# Patient Record
Sex: Female | Born: 1945
Health system: Southern US, Community
[De-identification: ages and names within clinical notes are randomized; demographics above are authoritative.]

## PROBLEM LIST (undated history)

## (undated) DIAGNOSIS — D649 Anemia, unspecified: Secondary | ICD-10-CM

## (undated) DIAGNOSIS — I509 Heart failure, unspecified: Secondary | ICD-10-CM

## (undated) DIAGNOSIS — R252 Cramp and spasm: Secondary | ICD-10-CM

## (undated) DIAGNOSIS — Z9289 Personal history of other medical treatment: Secondary | ICD-10-CM

## (undated) DIAGNOSIS — M1990 Unspecified osteoarthritis, unspecified site: Secondary | ICD-10-CM

## (undated) DIAGNOSIS — K219 Gastro-esophageal reflux disease without esophagitis: Secondary | ICD-10-CM

## (undated) DIAGNOSIS — C801 Malignant (primary) neoplasm, unspecified: Secondary | ICD-10-CM

## (undated) DIAGNOSIS — I639 Cerebral infarction, unspecified: Secondary | ICD-10-CM

## (undated) DIAGNOSIS — IMO0001 Reserved for inherently not codable concepts without codable children: Secondary | ICD-10-CM

## (undated) DIAGNOSIS — G4730 Sleep apnea, unspecified: Secondary | ICD-10-CM

## (undated) DIAGNOSIS — R011 Cardiac murmur, unspecified: Secondary | ICD-10-CM

## (undated) DIAGNOSIS — K5900 Constipation, unspecified: Secondary | ICD-10-CM

## (undated) DIAGNOSIS — I10 Essential (primary) hypertension: Secondary | ICD-10-CM

## (undated) HISTORY — PX: CARPAL TUNNEL RELEASE: SHX101

## (undated) HISTORY — PX: KNEE ARTHROPLASTY: SHX992

## (undated) HISTORY — PX: TUBAL LIGATION: SHX77

## (undated) HISTORY — PX: KNEE SURGERY: SHX244

## (undated) HISTORY — PX: COLONOSCOPY: SHX174

## (undated) HISTORY — DX: Malignant (primary) neoplasm, unspecified: C80.1

## (undated) HISTORY — PX: JOINT REPLACEMENT: SHX530

## (undated) HISTORY — PX: FOOT SURGERY: SHX648

## (undated) HISTORY — PX: HAND SURGERY: SHX662

## (undated) HISTORY — PX: ABDOMINAL HYSTERECTOMY: SHX81

---

## 1997-07-28 ENCOUNTER — Ambulatory Visit (HOSPITAL_COMMUNITY): Admission: RE | Admit: 1997-07-28 | Discharge: 1997-07-28 | Payer: Self-pay | Admitting: Obstetrics and Gynecology

## 1998-08-03 ENCOUNTER — Other Ambulatory Visit: Admission: RE | Admit: 1998-08-03 | Discharge: 1998-08-03 | Payer: Self-pay | Admitting: Obstetrics and Gynecology

## 1999-08-14 ENCOUNTER — Other Ambulatory Visit: Admission: RE | Admit: 1999-08-14 | Discharge: 1999-08-14 | Payer: Self-pay | Admitting: Obstetrics and Gynecology

## 1999-08-21 ENCOUNTER — Encounter: Admission: RE | Admit: 1999-08-21 | Discharge: 1999-08-21 | Payer: Self-pay | Admitting: Obstetrics and Gynecology

## 1999-08-21 ENCOUNTER — Encounter: Payer: Self-pay | Admitting: Obstetrics and Gynecology

## 2000-10-23 ENCOUNTER — Encounter: Payer: Self-pay | Admitting: Family Medicine

## 2000-10-23 ENCOUNTER — Encounter: Admission: RE | Admit: 2000-10-23 | Discharge: 2000-10-23 | Payer: Self-pay | Admitting: Family Medicine

## 2000-11-07 ENCOUNTER — Encounter: Admission: RE | Admit: 2000-11-07 | Discharge: 2000-11-07 | Payer: Self-pay | Admitting: Family Medicine

## 2000-11-12 ENCOUNTER — Other Ambulatory Visit: Admission: RE | Admit: 2000-11-12 | Discharge: 2000-11-12 | Payer: Self-pay | Admitting: Internal Medicine

## 2001-01-09 ENCOUNTER — Encounter: Admission: RE | Admit: 2001-01-09 | Discharge: 2001-01-09 | Payer: Self-pay | Admitting: Family Medicine

## 2001-01-09 ENCOUNTER — Encounter: Payer: Self-pay | Admitting: Family Medicine

## 2001-12-29 ENCOUNTER — Encounter: Admission: RE | Admit: 2001-12-29 | Discharge: 2001-12-29 | Payer: Self-pay | Admitting: Family Medicine

## 2001-12-29 ENCOUNTER — Encounter: Payer: Self-pay | Admitting: Family Medicine

## 2002-08-27 ENCOUNTER — Ambulatory Visit (HOSPITAL_COMMUNITY): Admission: RE | Admit: 2002-08-27 | Discharge: 2002-08-27 | Payer: Self-pay | Admitting: Gastroenterology

## 2002-12-31 ENCOUNTER — Ambulatory Visit (HOSPITAL_COMMUNITY): Admission: RE | Admit: 2002-12-31 | Discharge: 2002-12-31 | Payer: Self-pay | Admitting: Obstetrics and Gynecology

## 2003-04-07 ENCOUNTER — Other Ambulatory Visit: Admission: RE | Admit: 2003-04-07 | Discharge: 2003-04-07 | Payer: Self-pay | Admitting: Obstetrics and Gynecology

## 2004-01-06 ENCOUNTER — Ambulatory Visit (HOSPITAL_COMMUNITY): Admission: RE | Admit: 2004-01-06 | Discharge: 2004-01-06 | Payer: Self-pay | Admitting: Obstetrics and Gynecology

## 2004-01-17 ENCOUNTER — Encounter: Admission: RE | Admit: 2004-01-17 | Discharge: 2004-01-17 | Payer: Self-pay | Admitting: Obstetrics and Gynecology

## 2004-02-07 ENCOUNTER — Encounter: Admission: RE | Admit: 2004-02-07 | Discharge: 2004-02-07 | Payer: Self-pay | Admitting: Obstetrics and Gynecology

## 2004-07-14 ENCOUNTER — Ambulatory Visit: Payer: Self-pay | Admitting: Family Medicine

## 2004-07-20 ENCOUNTER — Encounter (HOSPITAL_COMMUNITY): Admission: RE | Admit: 2004-07-20 | Discharge: 2004-07-21 | Payer: Self-pay | Admitting: Internal Medicine

## 2004-08-03 ENCOUNTER — Ambulatory Visit: Payer: Self-pay | Admitting: Family Medicine

## 2004-08-17 ENCOUNTER — Ambulatory Visit: Payer: Self-pay | Admitting: Family Medicine

## 2004-08-25 ENCOUNTER — Ambulatory Visit: Payer: Self-pay | Admitting: Family Medicine

## 2005-01-08 ENCOUNTER — Encounter (INDEPENDENT_AMBULATORY_CARE_PROVIDER_SITE_OTHER): Payer: Self-pay | Admitting: Family Medicine

## 2005-01-08 LAB — CONVERTED CEMR LAB: Pap Smear: NORMAL

## 2005-01-16 ENCOUNTER — Ambulatory Visit (HOSPITAL_COMMUNITY): Admission: RE | Admit: 2005-01-16 | Discharge: 2005-01-16 | Payer: Self-pay | Admitting: Family Medicine

## 2005-02-05 ENCOUNTER — Encounter: Admission: RE | Admit: 2005-02-05 | Discharge: 2005-02-05 | Payer: Self-pay | Admitting: Family Medicine

## 2005-06-05 ENCOUNTER — Ambulatory Visit: Payer: Self-pay | Admitting: Family Medicine

## 2005-06-22 ENCOUNTER — Ambulatory Visit: Payer: Self-pay | Admitting: Family Medicine

## 2005-07-19 ENCOUNTER — Ambulatory Visit: Payer: Self-pay | Admitting: Family Medicine

## 2005-07-19 ENCOUNTER — Ambulatory Visit (HOSPITAL_COMMUNITY): Admission: RE | Admit: 2005-07-19 | Discharge: 2005-07-19 | Payer: Self-pay | Admitting: Otolaryngology

## 2005-08-13 ENCOUNTER — Ambulatory Visit: Payer: Self-pay | Admitting: Family Medicine

## 2005-08-14 ENCOUNTER — Ambulatory Visit: Admission: RE | Admit: 2005-08-14 | Discharge: 2005-08-14 | Payer: Self-pay | Admitting: Family Medicine

## 2005-08-14 ENCOUNTER — Encounter (INDEPENDENT_AMBULATORY_CARE_PROVIDER_SITE_OTHER): Payer: Self-pay | Admitting: Family Medicine

## 2005-08-15 ENCOUNTER — Ambulatory Visit: Payer: Self-pay | Admitting: Pulmonary Disease

## 2005-09-05 ENCOUNTER — Encounter (INDEPENDENT_AMBULATORY_CARE_PROVIDER_SITE_OTHER): Payer: Self-pay | Admitting: Family Medicine

## 2005-09-24 ENCOUNTER — Ambulatory Visit: Payer: Self-pay | Admitting: Family Medicine

## 2005-12-13 ENCOUNTER — Encounter: Payer: Self-pay | Admitting: Family Medicine

## 2005-12-13 DIAGNOSIS — G5600 Carpal tunnel syndrome, unspecified upper limb: Secondary | ICD-10-CM | POA: Insufficient documentation

## 2005-12-13 DIAGNOSIS — G609 Hereditary and idiopathic neuropathy, unspecified: Secondary | ICD-10-CM | POA: Insufficient documentation

## 2005-12-13 DIAGNOSIS — I499 Cardiac arrhythmia, unspecified: Secondary | ICD-10-CM | POA: Insufficient documentation

## 2005-12-13 DIAGNOSIS — I10 Essential (primary) hypertension: Secondary | ICD-10-CM | POA: Insufficient documentation

## 2005-12-24 ENCOUNTER — Ambulatory Visit: Payer: Self-pay | Admitting: Family Medicine

## 2006-01-31 ENCOUNTER — Encounter (INDEPENDENT_AMBULATORY_CARE_PROVIDER_SITE_OTHER): Payer: Self-pay | Admitting: Family Medicine

## 2006-02-06 ENCOUNTER — Ambulatory Visit (HOSPITAL_COMMUNITY): Admission: RE | Admit: 2006-02-06 | Discharge: 2006-02-06 | Payer: Self-pay | Admitting: Family Medicine

## 2006-03-20 ENCOUNTER — Encounter (INDEPENDENT_AMBULATORY_CARE_PROVIDER_SITE_OTHER): Payer: Self-pay | Admitting: Family Medicine

## 2006-03-25 ENCOUNTER — Ambulatory Visit: Payer: Self-pay | Admitting: Family Medicine

## 2006-04-15 ENCOUNTER — Encounter (INDEPENDENT_AMBULATORY_CARE_PROVIDER_SITE_OTHER): Payer: Self-pay | Admitting: Family Medicine

## 2006-05-15 ENCOUNTER — Encounter (INDEPENDENT_AMBULATORY_CARE_PROVIDER_SITE_OTHER): Payer: Self-pay | Admitting: Family Medicine

## 2006-05-16 ENCOUNTER — Telehealth (INDEPENDENT_AMBULATORY_CARE_PROVIDER_SITE_OTHER): Payer: Self-pay | Admitting: Family Medicine

## 2006-07-02 ENCOUNTER — Encounter (INDEPENDENT_AMBULATORY_CARE_PROVIDER_SITE_OTHER): Payer: Self-pay | Admitting: Family Medicine

## 2006-08-14 ENCOUNTER — Ambulatory Visit (HOSPITAL_COMMUNITY): Admission: RE | Admit: 2006-08-14 | Discharge: 2006-08-14 | Payer: Self-pay | Admitting: Family Medicine

## 2006-08-14 ENCOUNTER — Ambulatory Visit: Payer: Self-pay | Admitting: Family Medicine

## 2006-08-14 DIAGNOSIS — E785 Hyperlipidemia, unspecified: Secondary | ICD-10-CM | POA: Insufficient documentation

## 2006-08-14 LAB — CONVERTED CEMR LAB
Cholesterol, target level: 200 mg/dL
HDL goal, serum: 40 mg/dL
LDL Goal: 130 mg/dL

## 2006-08-15 ENCOUNTER — Telehealth (INDEPENDENT_AMBULATORY_CARE_PROVIDER_SITE_OTHER): Payer: Self-pay | Admitting: *Deleted

## 2006-08-15 ENCOUNTER — Encounter (INDEPENDENT_AMBULATORY_CARE_PROVIDER_SITE_OTHER): Payer: Self-pay | Admitting: Family Medicine

## 2006-08-20 ENCOUNTER — Ambulatory Visit (HOSPITAL_COMMUNITY): Admission: RE | Admit: 2006-08-20 | Discharge: 2006-08-20 | Payer: Self-pay | Admitting: Family Medicine

## 2006-08-21 ENCOUNTER — Telehealth (INDEPENDENT_AMBULATORY_CARE_PROVIDER_SITE_OTHER): Payer: Self-pay | Admitting: *Deleted

## 2006-08-21 ENCOUNTER — Encounter (INDEPENDENT_AMBULATORY_CARE_PROVIDER_SITE_OTHER): Payer: Self-pay | Admitting: Family Medicine

## 2006-09-09 ENCOUNTER — Encounter (INDEPENDENT_AMBULATORY_CARE_PROVIDER_SITE_OTHER): Payer: Self-pay | Admitting: Family Medicine

## 2006-09-13 ENCOUNTER — Encounter (INDEPENDENT_AMBULATORY_CARE_PROVIDER_SITE_OTHER): Payer: Self-pay | Admitting: Family Medicine

## 2006-09-26 ENCOUNTER — Encounter: Admission: RE | Admit: 2006-09-26 | Discharge: 2006-10-23 | Payer: Self-pay | Admitting: Neurosurgery

## 2006-10-25 ENCOUNTER — Encounter (INDEPENDENT_AMBULATORY_CARE_PROVIDER_SITE_OTHER): Payer: Self-pay | Admitting: Family Medicine

## 2006-10-29 ENCOUNTER — Encounter (INDEPENDENT_AMBULATORY_CARE_PROVIDER_SITE_OTHER): Payer: Self-pay | Admitting: Family Medicine

## 2006-11-13 ENCOUNTER — Ambulatory Visit: Payer: Self-pay | Admitting: Family Medicine

## 2007-01-13 ENCOUNTER — Ambulatory Visit: Payer: Self-pay | Admitting: Family Medicine

## 2007-01-13 LAB — CONVERTED CEMR LAB

## 2007-01-20 ENCOUNTER — Encounter (INDEPENDENT_AMBULATORY_CARE_PROVIDER_SITE_OTHER): Payer: Self-pay | Admitting: Family Medicine

## 2007-02-10 ENCOUNTER — Ambulatory Visit (HOSPITAL_COMMUNITY): Admission: RE | Admit: 2007-02-10 | Discharge: 2007-02-10 | Payer: Self-pay | Admitting: Family Medicine

## 2007-02-10 LAB — CONVERTED CEMR LAB: Pap Smear: NORMAL

## 2007-02-11 ENCOUNTER — Telehealth (INDEPENDENT_AMBULATORY_CARE_PROVIDER_SITE_OTHER): Payer: Self-pay | Admitting: *Deleted

## 2007-03-07 ENCOUNTER — Encounter (INDEPENDENT_AMBULATORY_CARE_PROVIDER_SITE_OTHER): Payer: Self-pay | Admitting: Family Medicine

## 2007-04-08 ENCOUNTER — Encounter (INDEPENDENT_AMBULATORY_CARE_PROVIDER_SITE_OTHER): Payer: Self-pay | Admitting: Family Medicine

## 2007-04-14 ENCOUNTER — Ambulatory Visit: Payer: Self-pay | Admitting: Family Medicine

## 2007-04-14 LAB — CONVERTED CEMR LAB
Bilirubin Urine: NEGATIVE
Blood in Urine, dipstick: NEGATIVE
Glucose, Urine, Semiquant: NEGATIVE
Ketones, urine, test strip: NEGATIVE
Nitrite: NEGATIVE
Protein, U semiquant: NEGATIVE
Specific Gravity, Urine: 1.025
Urobilinogen, UA: 0.2
WBC Urine, dipstick: NEGATIVE
pH: 5.5

## 2007-05-05 ENCOUNTER — Encounter (INDEPENDENT_AMBULATORY_CARE_PROVIDER_SITE_OTHER): Payer: Self-pay | Admitting: Family Medicine

## 2007-05-26 ENCOUNTER — Ambulatory Visit: Payer: Self-pay | Admitting: Family Medicine

## 2007-05-28 ENCOUNTER — Encounter (INDEPENDENT_AMBULATORY_CARE_PROVIDER_SITE_OTHER): Payer: Self-pay | Admitting: Family Medicine

## 2007-06-06 ENCOUNTER — Encounter (INDEPENDENT_AMBULATORY_CARE_PROVIDER_SITE_OTHER): Payer: Self-pay | Admitting: Family Medicine

## 2007-08-25 ENCOUNTER — Ambulatory Visit: Payer: Self-pay | Admitting: Family Medicine

## 2007-08-25 LAB — CONVERTED CEMR LAB: LDL Goal: 160 mg/dL

## 2007-11-25 ENCOUNTER — Ambulatory Visit: Payer: Self-pay | Admitting: Family Medicine

## 2007-11-25 ENCOUNTER — Ambulatory Visit (HOSPITAL_COMMUNITY): Admission: RE | Admit: 2007-11-25 | Discharge: 2007-11-25 | Payer: Self-pay | Admitting: Family Medicine

## 2007-11-25 DIAGNOSIS — M25579 Pain in unspecified ankle and joints of unspecified foot: Secondary | ICD-10-CM | POA: Insufficient documentation

## 2007-11-27 ENCOUNTER — Encounter (INDEPENDENT_AMBULATORY_CARE_PROVIDER_SITE_OTHER): Payer: Self-pay | Admitting: Family Medicine

## 2007-11-27 LAB — CONVERTED CEMR LAB
ALT: 10 U/L
AST: 17 U/L
Albumin: 3.9 g/dL
Alkaline Phosphatase: 98 U/L
BUN: 25 mg/dL — ABNORMAL HIGH
CO2: 22 meq/L
Calcium: 9.5 mg/dL
Chloride: 105 meq/L
Creatinine, Ser: 0.89 mg/dL
Glucose, Bld: 73 mg/dL
Potassium: 4.5 meq/L
Sodium: 140 meq/L
Total Bilirubin: 0.4 mg/dL
Total Protein: 7.3 g/dL

## 2007-12-23 ENCOUNTER — Ambulatory Visit: Payer: Self-pay | Admitting: Family Medicine

## 2008-01-12 LAB — CONVERTED CEMR LAB: Pap Smear: NORMAL

## 2008-02-16 ENCOUNTER — Ambulatory Visit (HOSPITAL_COMMUNITY): Admission: RE | Admit: 2008-02-16 | Discharge: 2008-02-16 | Payer: Self-pay | Admitting: Family Medicine

## 2008-03-30 ENCOUNTER — Ambulatory Visit: Payer: Self-pay | Admitting: Family Medicine

## 2008-03-31 ENCOUNTER — Encounter (INDEPENDENT_AMBULATORY_CARE_PROVIDER_SITE_OTHER): Payer: Self-pay | Admitting: Family Medicine

## 2008-03-31 LAB — CONVERTED CEMR LAB
ALT: 11 units/L (ref 0–35)
AST: 14 units/L (ref 0–37)
Albumin: 3.7 g/dL (ref 3.5–5.2)
Alkaline Phosphatase: 90 units/L (ref 39–117)
BUN: 18 mg/dL (ref 6–23)
Basophils Absolute: 0.1 10*3/uL (ref 0.0–0.1)
Basophils Relative: 1 % (ref 0–1)
CO2: 27 meq/L (ref 19–32)
Calcium: 9 mg/dL (ref 8.4–10.5)
Chloride: 109 meq/L (ref 96–112)
Cholesterol: 155 mg/dL (ref 0–200)
Creatinine, Ser: 0.88 mg/dL (ref 0.40–1.20)
Eosinophils Absolute: 0.2 10*3/uL (ref 0.0–0.7)
Eosinophils Relative: 3 % (ref 0–5)
Glucose, Bld: 87 mg/dL (ref 70–99)
HCT: 37.2 % (ref 36.0–46.0)
HDL: 66 mg/dL (ref >39)
Hemoglobin: 11.5 g/dL — ABNORMAL LOW (ref 12.0–15.0)
LDL Cholesterol: 79 mg/dL (ref 0–99)
Lymphocytes Relative: 25 % (ref 12–46)
Lymphs Abs: 1.6 10*3/uL (ref 0.7–4.0)
MCHC: 30.9 g/dL (ref 30.0–36.0)
MCV: 89.4 fL (ref 78.0–100.0)
Monocytes Absolute: 0.4 10*3/uL (ref 0.1–1.0)
Monocytes Relative: 6 % (ref 3–12)
Neutro Abs: 4.1 10*3/uL (ref 1.7–7.7)
Neutrophils Relative %: 65 % (ref 43–77)
Platelets: 305 10*3/uL (ref 150–400)
Potassium: 4.5 meq/L (ref 3.5–5.3)
RBC: 4.16 M/uL (ref 3.87–5.11)
RDW: 13.8 % (ref 11.5–15.5)
Sodium: 145 meq/L (ref 135–145)
TSH: 0.799 microintl units/mL (ref 0.350–4.500)
Total Bilirubin: 0.3 mg/dL (ref 0.3–1.2)
Total CHOL/HDL Ratio: 2.3
Total Protein: 6.9 g/dL (ref 6.0–8.3)
Triglycerides: 52 mg/dL (ref <150)
VLDL: 10 mg/dL (ref 0–40)
WBC: 6.4 10*3/uL (ref 4.0–10.5)

## 2008-04-01 LAB — CONVERTED CEMR LAB: Retic Ct Pct: 0.8 % (ref 0.4–3.1)

## 2008-04-05 ENCOUNTER — Encounter (INDEPENDENT_AMBULATORY_CARE_PROVIDER_SITE_OTHER): Payer: Self-pay | Admitting: Family Medicine

## 2008-04-13 ENCOUNTER — Ambulatory Visit: Payer: Self-pay | Admitting: Family Medicine

## 2008-04-13 DIAGNOSIS — D649 Anemia, unspecified: Secondary | ICD-10-CM | POA: Insufficient documentation

## 2008-04-29 ENCOUNTER — Encounter (INDEPENDENT_AMBULATORY_CARE_PROVIDER_SITE_OTHER): Payer: Self-pay | Admitting: Family Medicine

## 2008-05-25 ENCOUNTER — Ambulatory Visit: Payer: Self-pay | Admitting: Family Medicine

## 2008-05-25 DIAGNOSIS — K5900 Constipation, unspecified: Secondary | ICD-10-CM | POA: Insufficient documentation

## 2008-05-25 DIAGNOSIS — R0609 Other forms of dyspnea: Secondary | ICD-10-CM | POA: Insufficient documentation

## 2008-05-25 DIAGNOSIS — R0989 Other specified symptoms and signs involving the circulatory and respiratory systems: Secondary | ICD-10-CM | POA: Insufficient documentation

## 2008-05-25 LAB — CONVERTED CEMR LAB
Bilirubin Urine: NEGATIVE
Blood in Urine, dipstick: NEGATIVE
Glucose, Urine, Semiquant: NEGATIVE
Ketones, urine, test strip: NEGATIVE
Nitrite: NEGATIVE
Protein, U semiquant: 30
Specific Gravity, Urine: 1.02
Urobilinogen, UA: NEGATIVE
pH: 6

## 2008-06-02 ENCOUNTER — Encounter (INDEPENDENT_AMBULATORY_CARE_PROVIDER_SITE_OTHER): Payer: Self-pay | Admitting: Family Medicine

## 2008-08-24 ENCOUNTER — Ambulatory Visit: Payer: Self-pay | Admitting: Family Medicine

## 2008-09-24 ENCOUNTER — Telehealth (INDEPENDENT_AMBULATORY_CARE_PROVIDER_SITE_OTHER): Payer: Self-pay | Admitting: Family Medicine

## 2008-09-28 ENCOUNTER — Ambulatory Visit: Payer: Self-pay | Admitting: Family Medicine

## 2008-09-28 DIAGNOSIS — L259 Unspecified contact dermatitis, unspecified cause: Secondary | ICD-10-CM | POA: Insufficient documentation

## 2008-10-28 ENCOUNTER — Ambulatory Visit (HOSPITAL_COMMUNITY): Admission: RE | Admit: 2008-10-28 | Discharge: 2008-10-28 | Payer: Self-pay | Admitting: Rheumatology

## 2009-02-17 ENCOUNTER — Ambulatory Visit (HOSPITAL_COMMUNITY): Admission: RE | Admit: 2009-02-17 | Discharge: 2009-02-17 | Payer: Self-pay | Admitting: Family Medicine

## 2009-09-30 ENCOUNTER — Ambulatory Visit (HOSPITAL_BASED_OUTPATIENT_CLINIC_OR_DEPARTMENT_OTHER): Admission: RE | Admit: 2009-09-30 | Discharge: 2009-10-01 | Payer: Self-pay | Admitting: Orthopedic Surgery

## 2010-01-15 ENCOUNTER — Inpatient Hospital Stay (HOSPITAL_COMMUNITY)
Admission: EM | Admit: 2010-01-15 | Discharge: 2010-01-17 | Payer: Self-pay | Source: Home / Self Care | Attending: Internal Medicine | Admitting: Internal Medicine

## 2010-01-23 LAB — CBC
HCT: 33.1 % — ABNORMAL LOW (ref 36.0–46.0)
HCT: 32 % — ABNORMAL LOW (ref 36.0–46.0)
HCT: 32.1 % — ABNORMAL LOW (ref 36.0–46.0)
Hemoglobin: 10.6 g/dL — ABNORMAL LOW (ref 12.0–15.0)
Hemoglobin: 10 g/dL — ABNORMAL LOW (ref 12.0–15.0)
Hemoglobin: 10.1 g/dL — ABNORMAL LOW (ref 12.0–15.0)
MCH: 29 pg (ref 26.0–34.0)
MCH: 28.7 pg (ref 26.0–34.0)
MCH: 28.9 pg (ref 26.0–34.0)
MCHC: 32 g/dL (ref 30.0–36.0)
MCHC: 31.3 g/dL (ref 30.0–36.0)
MCHC: 31.5 g/dL (ref 30.0–36.0)
MCV: 90.7 fL (ref 78.0–100.0)
MCV: 91.7 fL (ref 78.0–100.0)
MCV: 92 fL (ref 78.0–100.0)
Platelets: 352 10*3/uL (ref 150–400)
Platelets: 307 10*3/uL (ref 150–400)
Platelets: 279 10*3/uL (ref 150–400)
RBC: 3.65 MIL/uL — ABNORMAL LOW (ref 3.87–5.11)
RBC: 3.49 MIL/uL — ABNORMAL LOW (ref 3.87–5.11)
RBC: 3.49 MIL/uL — ABNORMAL LOW (ref 3.87–5.11)
RDW: 14.6 % (ref 11.5–15.5)
RDW: 14.9 % (ref 11.5–15.5)
RDW: 15.2 % (ref 11.5–15.5)
WBC: 8.3 10*3/uL (ref 4.0–10.5)
WBC: 6.4 10*3/uL (ref 4.0–10.5)
WBC: 6.1 10*3/uL (ref 4.0–10.5)

## 2010-01-23 LAB — CARDIAC PANEL(CRET KIN+CKTOT+MB+TROPI)
CK, MB: 1 ng/mL (ref 0.3–4.0)
CK, MB: 1 ng/mL (ref 0.3–4.0)
Relative Index: INVALID (ref 0.0–2.5)
Relative Index: INVALID (ref 0.0–2.5)
Total CK: 48 U/L (ref 7–177)
Total CK: 46 U/L (ref 7–177)
Troponin I: 0.02 ng/mL (ref 0.00–0.06)
Troponin I: 0.02 ng/mL (ref 0.00–0.06)

## 2010-01-23 LAB — GLUCOSE, CAPILLARY
Glucose-Capillary: 80 mg/dL (ref 70–99)
Glucose-Capillary: 79 mg/dL (ref 70–99)
Glucose-Capillary: 74 mg/dL (ref 70–99)
Glucose-Capillary: 71 mg/dL (ref 70–99)
Glucose-Capillary: 78 mg/dL (ref 70–99)
Glucose-Capillary: 91 mg/dL (ref 70–99)

## 2010-01-23 LAB — COMPREHENSIVE METABOLIC PANEL
ALT: 11 U/L (ref 0–35)
AST: 14 U/L (ref 0–37)
Albumin: 3 g/dL — ABNORMAL LOW (ref 3.5–5.2)
Alkaline Phosphatase: 79 U/L (ref 39–117)
BUN: 16 mg/dL (ref 6–23)
CO2: 24 mEq/L (ref 19–32)
Calcium: 8.6 mg/dL (ref 8.4–10.5)
Chloride: 112 mEq/L (ref 96–112)
Creatinine, Ser: 1.1 mg/dL (ref 0.4–1.2)
GFR calc Af Amer: >60 mL/min (ref >60)
GFR calc non Af Amer: 50 mL/min — ABNORMAL LOW (ref >60)
Glucose, Bld: 67 mg/dL — ABNORMAL LOW (ref 70–99)
Potassium: 4 mEq/L (ref 3.5–5.1)
Sodium: 142 mEq/L (ref 135–145)
Total Bilirubin: 0.5 mg/dL (ref 0.3–1.2)
Total Protein: 6.2 g/dL (ref 6.0–8.3)

## 2010-01-23 LAB — DIFFERENTIAL
Basophils Absolute: 0.1 10*3/uL (ref 0.0–0.1)
Basophils Relative: 1 % (ref 0–1)
Eosinophils Absolute: 0.1 10*3/uL (ref 0.0–0.7)
Eosinophils Relative: 1 % (ref 0–5)
Lymphocytes Relative: 32 % (ref 12–46)
Lymphs Abs: 2.7 10*3/uL (ref 0.7–4.0)
Monocytes Absolute: 0.3 10*3/uL (ref 0.1–1.0)
Monocytes Relative: 4 % (ref 3–12)
Neutro Abs: 5.2 10*3/uL (ref 1.7–7.7)
Neutrophils Relative %: 63 % (ref 43–77)

## 2010-01-23 LAB — URINE MICROSCOPIC-ADD ON

## 2010-01-23 LAB — BASIC METABOLIC PANEL
BUN: 20 mg/dL (ref 6–23)
CO2: 25 mEq/L (ref 19–32)
Calcium: 9.4 mg/dL (ref 8.4–10.5)
Chloride: 107 mEq/L (ref 96–112)
Creatinine, Ser: 1.06 mg/dL (ref 0.4–1.2)
GFR calc Af Amer: >60 mL/min (ref >60)
GFR calc non Af Amer: 52 mL/min — ABNORMAL LOW (ref >60)
Glucose, Bld: 88 mg/dL (ref 70–99)
Potassium: 4.3 mEq/L (ref 3.5–5.1)
Sodium: 141 mEq/L (ref 135–145)

## 2010-01-23 LAB — URINALYSIS, ROUTINE W REFLEX MICROSCOPIC
Bilirubin Urine: NEGATIVE
Hgb urine dipstick: NEGATIVE
Ketones, ur: NEGATIVE mg/dL
Nitrite: NEGATIVE
Protein, ur: NEGATIVE mg/dL
Specific Gravity, Urine: 1.021 (ref 1.005–1.030)
Urine Glucose, Fasting: NEGATIVE mg/dL
Urobilinogen, UA: 1 mg/dL (ref 0.0–1.0)
pH: 5.5 (ref 5.0–8.0)

## 2010-01-23 LAB — POCT CARDIAC MARKERS
CKMB, poc: 1.2 ng/mL (ref 1.0–8.0)
Myoglobin, poc: 108 ng/mL (ref 12–200)
Troponin i, poc: <0.05 ng/mL (ref 0.00–0.09)

## 2010-01-23 LAB — PROTIME-INR
INR: 1.02 (ref 0.00–1.49)
Prothrombin Time: 13.6 seconds (ref 11.6–15.2)

## 2010-01-23 LAB — CK TOTAL AND CKMB (NOT AT ARMC)
CK, MB: 1.2 ng/mL (ref 0.3–4.0)
Relative Index: INVALID (ref 0.0–2.5)
Total CK: 71 U/L (ref 7–177)

## 2010-01-23 LAB — HEMOGLOBIN A1C
Hgb A1c MFr Bld: 6 % — ABNORMAL HIGH (ref <5.7)
Mean Plasma Glucose: 126 mg/dL — ABNORMAL HIGH (ref <117)

## 2010-01-23 LAB — TROPONIN I: Troponin I: 0.02 ng/mL (ref 0.00–0.06)

## 2010-01-23 LAB — ABO/RH: ABO/RH(D): O POS

## 2010-01-23 LAB — TYPE AND SCREEN
ABO/RH(D): O POS
Antibody Screen: NEGATIVE

## 2010-01-23 LAB — OCCULT BLOOD, POC DEVICE: Fecal Occult Bld: POSITIVE

## 2010-01-23 LAB — APTT: aPTT: 34 seconds (ref 24–37)

## 2010-01-27 ENCOUNTER — Other Ambulatory Visit (HOSPITAL_COMMUNITY): Payer: Self-pay | Admitting: Family Medicine

## 2010-01-27 DIAGNOSIS — Z0000 Encounter for general adult medical examination without abnormal findings: Secondary | ICD-10-CM

## 2010-01-27 DIAGNOSIS — Z1231 Encounter for screening mammogram for malignant neoplasm of breast: Secondary | ICD-10-CM

## 2010-01-27 NOTE — Discharge Summary (Signed)
[name], [name]             ACCOUNT NO.:  [id number]  MEDICAL RECORD NO.:  [id number]          PATIENT TYPE:  INP  LOCATION:  6705                         FACILITY:  [facility]  PHYSICIAN:  Osvaldo Shipper, MD     DATE OF BIRTH:  [birth year]  DATE OF ADMISSION:  01/15/2010 DATE OF DISCHARGE:  01/17/2010                              DISCHARGE SUMMARY   PRIMARY CARE PHYSICIAN:  [facility].  The patient was seen in consultation during this hospitalization by Dr. Anselmo Rod, gastroenterologist, and also by Dr. Almond Lint with [facility].  Imaging studies done during this admission include a chest x-ray which showed no acute disease.  PERTINENT LABS:  Hemoglobin of 10.1 which had been stable.  Renal function was normal.  Hemoglobin A1c was 6.0.  Cardiac enzymes were negative.  UA did show small leukocytes.  Squamous epithelial cells 0-2 WBCs.  DISCHARGE DIAGNOSES: 1. Hemorrhoidal bleeding requiring outpatient surgery followup. 2. Hypotension resolved. 3. Rectal bleed as a result of hemorrhoidal bleeding. 4. Anemia secondary to blood loss.  Did not have transfusion. 5. Mild hypoglycemia due to initiation of oral hypoglycemic agents     erroneously.  BRIEF HOSPITAL COURSE: 1. Rectal bleed.  This is a 65 year old African American female who     presented to the hospital with complaints of weakness and rectal     bleeding.  This has been ongoing for 2 days.  The patient when she     was seen in the ED was found to have a low blood pressure at 85/42.     Hemoglobin was 10.6.  The patient was admitted because of low blood     pressure.  She was given IV fluids.  The patient's blood pressure     improved.  Her hemoglobin remained stable.  Bleeding resolved.  She     was seen by Dr. Charna Elizabeth from gastroenterology who recommended     surgery consultation.  She was seen by general surgery who     recommended sitz baths, Anusol-HC cream and Anusol  suppositories     and follow-up in their office.  Since her bleeding was resolved and     hemoglobin is stable, she will be discharged home.  Sitz baths were     also recommended. 2. Hypoglycemia.  This was noted as well during the course of the     hospitalization.  Initially the admitting physician was under the     impression that the patient has type 2 diabetes.  However, it     became apparent that the patient and her husband's medication were     listed on the same sheet of paper and the husband does have     diabetes.  His medication was started on her.  This resulted in     hypoglycemia.  After these medications were discontinued, her blood     sugar has improved.  She has been tolerating a diet with no     difficulties. 3. Hypotension was probably from volume loss with IV fluids.  Blood     pressure has improved.  Orthostatics were  checked just a little     while ago and she is not orthostatic anymore and she has been     ambulating with no difficulties. 4. Anemia is probably from acute blood loss.  She did not require     transfusion.  She will be followed up as an outpatient. 5. UA was found to be abnormal.  However, it appears to be a     contaminated sample.  WBCs are only 0 to 2.  Her white cell count     was normal.  She was afebrile and so she was not started on any     antibiotics.  The patient was keen on going home after being evaluated by the surgeon and since there was no other reason to keep her here, she was allowed to go home.  She was discharged.  DISCHARGE MEDICATIONS: 1. Anusol-HC cream one application topically three times a day around     the anus. 2. Anusol hemorrhoidal suppository one application rectally three     times day. 3. Colace 100 mg p.o. b.i.d. 4. MiraLAX 17 grams p.o. daily as needed for constipation. 5. Amitriptyline 50 mg daily at bedtime. 6. Aspirin 81 mg p.o. daily. 7. Folic acid 1 mg p.o. b.i.d. 8. Humira injections 40 mg  subcutaneously every other week on Sundays. 9. Hydrochlorothiazide 25 mg p.o. daily. 10.Methotrexate 2.5 mg 8 tablets weekly on Saturdays. 11.Metoprolol 25 mg p.o. daily. 12.Naproxen 500 mg p.o. b.i.d. 13.Ramipril  2.5 mg p.o. daily. 14.Simvastatin 20 mg p.o. daily. 15.Tramadol 50 mg 1-2 tablets daily as needed.  Please note that we will ask the patient to restart her hydrochlorothiazide, her metoprolol and her ramipril after 2 days.  DIET:  High fiber diet.  PHYSICAL ACTIVITY:  As before.  She has been told to avoid constipation. She has been asked to use sitz baths three times a week with lukewarm water.  FOLLOWUP:  Followup with Dr. Donell Beers with [facility] in 3 weeks; with her PCP as needed.  Total time on this discharge encounter 35 minutes.    Osvaldo Shipper, MD     GK/MEDQ  D:  01/17/2010  T:  01/17/2010  Job:  [id number]  cc:   Almond Lint, MD Anselmo Rod, MD, [facility]  Electronically Signed by Osvaldo Shipper MD on 01/26/2010 07:21:39 PM

## 2010-02-05 LAB — CONVERTED CEMR LAB
Albumin: 3.9 g/dL
Alkaline Phosphatase: 105 units/L
BUN: 18 mg/dL
Calcium: 9.4 mg/dL
Chloride: 105 meq/L
Cholesterol: 152 mg/dL
Creatinine, Ser: 0.81 mg/dL
Glucose, Bld: 74 mg/dL
HDL: 68 mg/dL
LDL Cholesterol: 72 mg/dL
Potassium: 4.3 meq/L
Sodium: 144 meq/L
Total Bilirubin: 0.2 mg/dL
Total Protein: 7.3 g/dL
Triglycerides: 59 mg/dL
VLDL: 12 mg/dL

## 2010-02-07 NOTE — Assessment & Plan Note (Signed)
Summary: 2 MONTH FOLLOW UP/DMS   Vital Signs:  Patient Profile:   65 Years Old Female Height:     60 inches (152.4 cm) Weight:      229 pounds BMI:     44.89 O2 Sat:      97 % Temp:     97 degrees F Pulse rate:   78 / minute Resp:     14 per minute BP sitting:   130 / 88  Vitals Entered By: Sherilyn Banker (January 13, 2007 2:59 PM)                 PCP:  Franchot Heidelberg, MD  Chief Complaint:  follow up visit.  History of Present Illness: Pt in for recheck.  She has had a URI for a week. States has cough and nasal congestion. States clear to yellow. Has self treated with Alka SelzerPlus. She denies fever, chills and sweats. Has felt hot and cold. She denies chest pain, orthopnea, PND and palpitations and leg swelling. Notes no ear pain or sore throat. Notes illness going through house - son and husbnd have same. Her apetite is good. She denies nausea and vomitting. No diarrhea or constipation. SHe has an irritated nose and notes from blowing it so much. Vasline has been applied and not helping to much.  Now presents.  Hypertension History:      She denies headache, chest pain, palpitations, dyspnea with exertion, orthopnea, PND, peripheral edema, visual symptoms, neurologic problems, syncope, and side effects from treatment.  She notes no problems with any antihypertensive medication side effects.        Positive major cardiovascular risk factors include female age 80 years old or older, hyperlipidemia, and hypertension.  Negative major cardiovascular risk factors include no history of diabetes, negative family history for ischemic heart disease, and non-tobacco-user status.        Further assessment for target organ damage reveals no history of ASHD, stroke/TIA, or peripheral vascular disease.    Lipid Management History:      Positive NCEP/ATP III risk factors include female age 14 years old or older and hypertension.  Negative NCEP/ATP III risk factors include no history of  early menopause without estrogen hormone replacement, non-diabetic, no family history for ischemic heart disease, non-tobacco-user status, no ASHD (atherosclerotic heart disease), no prior stroke/TIA, no peripheral vascular disease, and no history of aortic aneurysm.        The patient states that she knows about the "Therapeutic Lifestyle Change" diet.  Her compliance with the TLC diet is fair.  The patient does not know about adjunctive measures for cholesterol lowering.  Adjunctive measures started by the patient include aerobic exercise, fiber, ASA, and omega-3 supplements.  She expresses no side effects from her lipid-lowering medication.  The patient denies any symptoms to suggest myopathy or liver disease from her "statin" therapy.  Comments: See labs from Dr. Domingo Sep in Sept. Due for recheck in March.    Current Allergies: No known allergies   Past Medical History:    Reviewed history from 12/13/2005 and no changes required:       Hypertension       Low back pain       Peripheral neuropathy  Past Surgical History:    Reviewed history from 03/25/2006 and no changes required:       TAH       KNEE SURGERY LEFT   Family History:    Reviewed history from 03/25/2006 and no changes required:  Father: Dead 68 CHF       Mother: 65  RA, DM, CHF, Hodgkins       Siblings: Sister x 1: Depression, Anxiety, Thyroid Cancer - not sure of age; 2 x brothers: OA  Social History:    Reviewed history from 03/25/2006 and no changes required:       Occupation: Dentist       Married       Former Smoker       Alcohol use-no       Drug use-no   Risk Factors: Tobacco use:  quit    Year quit:  1982    Pack-years:  3 to 7 cigs for ten years Drug use:  no Alcohol use:  no  Family History Risk Factors:    Family History of MI in females < 66 years old:  no    Family History of MI in males < 30 years old:  no  Colonoscopy History:    Date of Last Colonoscopy:  01/09/2000   Mammogram History:     Date of Last Mammogram:  01/13/2007    Results:  GYN at [facility] - records pending  PAP Smear History:     Date of Last PAP Smear:  01/13/2007    Results:  GYN at [facility] - records pending   Review of Systems      See HPI  General      Denies fatigue and malaise.  Resp      See HPI.  GI      Denies abdominal pain, constipation, diarrhea, nausea, and vomiting.  GU      Female exam via GYN at [facility]. Has done in February.  Neuro      Denies difficulty with concentration, headaches, tingling, tremors, and weakness.      States back doing well. Has DDD. She completed PT. Notes doing well. States occasional pain. Notes would rate as 2/10. Happy as not ready for surgery. She has some numbness and tingling to right leg but notes after PT has done better. Comes and goes as back pain. Sees [facility] again next few weeks - Dr.Hirsch.  Psych      Denies anxiety and depression.   Physical Exam  General:     Well-developed,well-nourished,in no acute distress; alert,appropriate and cooperative throughout examination. Obese. Head:     Normocephalic and atraumatic without obvious abnormalities. No apparent alopecia or balding. Eyes:     No corneal or conjunctival inflammation noted. EOMI. Perrla.  Ears:     External ear exam shows no significant lesions or deformities.  Otoscopic examination reveals clear canals, tympanic membranes are intact bilaterally without bulging, retraction, inflammation or discharge. Hearing is grossly normal bilaterally. Nose:     Mod congestion with clear liquid. Mouth:     Oral mucosa and oropharynx without lesions or exudates.   Lungs:     Normal respiratory effort, chest expands symmetrically. Lungs are clear to auscultation, no crackles or wheezes. Heart:     Normal rate and regular rhythm. S1 and S2 normal without gallop, murmur, click, rub or other extra sounds. Abdomen:     Bowel sounds  positive,abdomen soft and non-tender without masses, organomegaly or hernias noted. Extremities:     No clubbing, cyanosis, edema, or deformity noted with normal full range of motion of all joints.   Neurologic:     No cranial nerve deficits noted. Station and gait are normal. Plantar reflexes are down-going bilaterally. DTRs are symmetrical throughout.  Sensory, motor and coordinative functions appear intact. HAS normal SLT but reports lower back pain on right  -notes shoots across lower back to left. Similar to pain at night. Cervical Nodes:     No lymphadenopathy noted Psych:     Cognition and judgment appear intact. Alert and cooperative with normal attention span and concentration. No apparent delusions, illusions, hallucinations    Impression & Recommendations:  Problem # 1:  DEGENERATIVE DISC DISEASE (ICD-722.6) Per Neurosurgery. Advised back exersize and weight loss.   Problem # 2:  HYPERLIPIDEMIA (ICD-272.4) Statin as is. Labs as per Dr. Skip Estimable. TLC a must. Her updated medication list for this problem includes:    Simvastatin 20 Mg Tabs (Simvastatin) ..... One at bedtime   Problem # 3:  MORBID OBESITY (ICD-278.01) Councelled. Not candidate for phentermine until TLC in place. Will start actively at [employer] as friend gave membership forXmas. Limit portions. Make healthy choices. Recheck 3 months and optomize.  Problem # 4:  VIRAL URI (ICD-465.9) Sx rx only. Fluid push, rest and daily MVI with use of Mucinex. Update if sx worsen or persist in 10 days.  Her updated medication list for this problem includes:    Naprosyn Susp (Naproxen susp) .Marland Kitchen... As needed    Aspirin 81 Mg Tbec (Aspirin) ..... Once daily   Problem # 5:  Preventive Health Care (ICD-V70.0) Female exam at [facility] GYN next few weeks. Await report.  Complete Medication List: 1)  Hydrochlorothiazide 25 Mg Tabs (Hydrochlorothiazide) .... Once daily 2)  Metoprolol Succinate 25 Mg Tb24 (Metoprolol succinate)  .... 1/2 once daily 3)  Altace 2.5 Mg Caps (Ramipril) .... Once daily 4)  Naprosyn Susp (Naproxen susp) .... As needed 5)  Aspirin 81 Mg Tbec (Aspirin) .... Once daily 6)  Simvastatin 20 Mg Tabs (Simvastatin) .... One at bedtime  Hypertension Assessment/Plan:      The patient's hypertensive risk group is category B: At least one risk factor (excluding diabetes) with no target organ damage.  Today's blood pressure is 130/88.  Her blood pressure goal is < 140/90.  Lipid Assessment/Plan:      Based on NCEP/ATP III, the patient's risk factor category is "2 or more risk factors and a calculated 10 year CAD risk of < 20%".  From this information, the patient's calculated lipid goals are as follows: Total cholesterol goal is 200; LDL cholesterol goal is 130; HDL cholesterol goal is 40; Triglyceride goal is 150.     Patient Instructions: 1)  Please schedule a follow-up appointment in 3 months.    ]  Preventive Care Screening  Mammogram:    Date:  01/13/2007    Results:  GYN at [facility] - records pending  Pap Smear:    Date:  01/13/2007    Results:  GYN at [facility] - records pending

## 2010-02-07 NOTE — Letter (Signed)
[facility]   Imported By: Curtis Sites 06/09/2008 15:22:01  _____________________________________________________________________  External Attachment:    Type:   Image     Comment:   External Document

## 2010-02-07 NOTE — Assessment & Plan Note (Signed)
Summary: follow up 3 month/slj   Vital Signs:  Patient Profile:   65 Years Old Female Height:     60 inches (152.4 cm) Weight:      219 pounds BMI:     42.93 O2 Sat:      99 % Temp:     98.0 degrees F Pulse rate:   70 / minute Resp:     12 per minute BP sitting:   136 / 81  Vitals Entered By: Sherilyn Banker (November 25, 2007 8:57 AM)                 PCP:  Franchot Heidelberg, MD  Chief Complaint:  follow up visit.  History of Present Illness: Pt in for recheck.  She notes she is doing well.  She states she is having some ankle pain. She states both sides hurt. She states has stiffness in am and they feel tight. She notes as the day goes by gets a little better. She rates her pain as 9/10 this morning. She denies falls and injuries. Notes no hx of gout. She denies redness and swelling. She does have hx of DDD but notes separate issues. She is morbidly obese and cont to go to [facility]. She has gained 2 pounds since last visit. She has had ankle pain for few months now. She states using Naproxen for pain and some relief. States using 2 at same time as this will relive symptoms. She thinks just OA but wants to be sure.  She now presents.  Hypertension History:      She denies headache, chest pain, palpitations, dyspnea with exertion, orthopnea, PND, peripheral edema, visual symptoms, neurologic problems, syncope, and side effects from treatment.  She notes no problems with any antihypertensive medication side effects.  Further comments include: Taking BP medication daily. Marland Kitchen        Positive major cardiovascular risk factors include female age 65 years old or older, hyperlipidemia, and hypertension.  Negative major cardiovascular risk factors include no history of diabetes, negative family history for ischemic heart disease, and non-tobacco-user status.        Further assessment for target organ damage reveals no history of ASHD, stroke/TIA, or peripheral vascular disease.    Lipid  Management History:      Positive NCEP/ATP III risk factors include female age 38 years old or older and hypertension.  Negative NCEP/ATP III risk factors include no history of early menopause without estrogen hormone replacement, non-diabetic, HDL cholesterol greater than 60, no family history for ischemic heart disease, non-tobacco-user status, no ASHD (atherosclerotic heart disease), no prior stroke/TIA, no peripheral vascular disease, and no history of aortic aneurysm.        The patient states that she knows about the "Therapeutic Lifestyle Change" diet.  Her compliance with the TLC diet is fair.  The patient expresses understanding of adjunctive measures for cholesterol lowering.  Adjunctive measures started by the patient include aerobic exercise, fiber, and ASA.  She expresses no side effects from her lipid-lowering medication.  The patient denies any symptoms to suggest myopathy or liver disease from her "statin" therapy.        Prior Medications Reviewed Using: Patient Recall  Updated Prior Medication List: HYDROCHLOROTHIAZIDE 25 MG TABS (HYDROCHLOROTHIAZIDE) once daily METOPROLOL SUCCINATE 25 MG TB24 (METOPROLOL SUCCINATE) 1/2 once daily ALTACE 2.5 MG CAPS (RAMIPRIL) once daily EC-NAPROSYN 500 MG  TBEC (NAPROXEN) One two times a day ASPIRIN 81 MG TBEC (ASPIRIN) once daily SIMVASTATIN 20 MG  TABS (  SIMVASTATIN) One at bedtime  Current Allergies (reviewed today): No known allergies   Past Medical History:    Reviewed history from 08/25/2007 and no changes required:       Current Problems:        DEGENERATIVE DISC DISEASE (ICD-722.6)       HYPERLIPIDEMIA (ICD-272.4)       ANOSMIA (ICD-781.1)       MORBID OBESITY (ICD-278.01)       SLEEP APNEA (ICD-780.57)       CARPAL TUNNEL SYNDROME (ICD-354.0)       DYSRHYTHMIA, 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)       PERIPHERAL NEUROPATHY (ICD-356.9)       LOW BACK PAIN (ICD-724.2)       HYPERTENSION (ICD-401.9)         Past Surgical History:     Reviewed history from 03/25/2006 and no changes required:       TAH       KNEE SURGERY LEFT   Family History:    Reviewed history from 03/25/2006 and no changes required:       Father: Dead 41 CHF       Mother: 76  RA, DM, CHF, Hodgkins       Siblings: Sister x 1: Depression, Anxiety, Thyroid Cancer - not sure of age; 2 x brothers: OA  Social History:    Reviewed history from 03/25/2006 and no changes required:       Occupation: Dentist       Married       Former Smoker       Alcohol use-no       Drug use-no   Risk Factors: Tobacco use:  quit    Year quit:  1982    Pack-years:  3 to 7 cigs for ten years Drug use:  no Alcohol use:  no  Family History Risk Factors:    Family History of MI in females < 41 years old:  no    Family History of MI in males < 37 years old:  no  Colonoscopy History:    Date of Last Colonoscopy:  01/09/2000  Mammogram History:    Date of Last Mammogram:  02/10/2007  PAP Smear History:    Date of Last PAP Smear:  02/10/2007   Review of Systems      See HPI  General      Denies chills, fever, and sweats.  Resp      Denies cough, shortness of breath, sputum productive, and wheezing.  GI      Denies abdominal pain, constipation, diarrhea, nausea, and vomiting.  GU      Denies nocturia, urinary frequency, and urinary hesitancy.   Physical Exam  General:     Well-developed,well-nourished,in no acute distress; alert,appropriate and cooperative throughout examination. Obese. Lungs:     Normal respiratory effort, chest expands symmetrically. Lungs are clear to auscultation, no crackles or wheezes. Heart:     Normal rate and regular rhythm. S1 and S2 normal without gallop, murmur, click, rub or other extra sounds. Abdomen:     Bowel sounds positive,abdomen soft and non-tender without masses, organomegaly or hernias noted. Extremities:     No clubbing, cyanosis, edema, or deformity noted with normal full range of motion of  all joints.  Reports pain on ankle rotation and crepitus is felt. Cervical Nodes:     No lymphadenopathy noted Psych:     Cognition and judgment appear intact. Alert and cooperative with normal attention span and concentration. No  apparent delusions, illusions, hallucinations    Impression & Recommendations:  Problem # 1:  ANKLE PAIN, BILATERAL (ICD-719.47) Suspect OA. Get plain films. Start Naproxen two times a day and add Tylebol 500 mg two three times a day as needed for breakthrough pain. Start topical voltaren gel and gave single Decadron?Depomedrol shot for pain relief with risk and benefit outlined. Recheck 4 weeks. Orders: T-DG Ankle 2 Views *R* ([id number])   Problem # 2:  MORBID OBESITY (ICD-278.01) Weight loss is crucial. Aware of morbidity and mortality risk and edcuated how this plays into ankle pain. States will try harder. Recheck 4 weeks. Advised portion control, low fat diet and daily exersize for 30 minutes with calorie intake of 1500.  Problem # 3:  HYPERTENSION (ICD-401.9) Stable. Meds as below. Check renal fucntion and lytes. Limit salt, lose weight. Her updated medication list for this problem includes:    Hydrochlorothiazide 25 Mg Tabs (Hydrochlorothiazide) ..... Once daily    Metoprolol Succinate 25 Mg Tb24 (Metoprolol succinate) .Marland Kitchen... 1/2 once daily    Altace 2.5 Mg Caps (Ramipril) ..... Once daily  Orders: T-Comprehensive Metabolic Panel ([id number])   Problem # 4:  HYPERLIPIDEMIA (ICD-272.4) Check LFTs on high risk med. TLC is a must. Her updated medication list for this problem includes:    Simvastatin 20 Mg Tabs (Simvastatin) ..... One at bedtime  Orders: T-Comprehensive Metabolic Panel ([id number])   Problem # 5:  Preventive Health Care (ICD-V70.0) We are out of flu shots and advised shortage. Pt to all HD and get via them - also to check with them on H1N1.  Complete Medication List: 1)  Hydrochlorothiazide 25 Mg Tabs (Hydrochlorothiazide) ....  Once daily 2)  Metoprolol Succinate 25 Mg Tb24 (Metoprolol succinate) .... 1/2 once daily 3)  Altace 2.5 Mg Caps (Ramipril) .... Once daily 4)  Ec-naprosyn 500 Mg Tbec (Naproxen) .... One two times a day 5)  Aspirin 81 Mg Tbec (Aspirin) .... Once daily 6)  Simvastatin 20 Mg Tabs (Simvastatin) .... One at bedtime  Other Orders: Depo- Medrol 80mg  (J1040) Dexamethasone Sodium Phosphate 1mg  (J1100) Admin of Therapeutic Inj  intramuscular or subcutaneous (29562)  Hypertension Assessment/Plan:      The patient's hypertensive risk group is category B: At least one risk factor (excluding diabetes) with no target organ damage.  Her calculated 10 year risk of coronary heart disease is 5 %.  Today's blood pressure is 136/81.  Her blood pressure goal is < 140/90.  Lipid Assessment/Plan:      Based on NCEP/ATP III, the patient's risk factor category is "0-1 risk factors".  From this information, the patient's calculated lipid goals are as follows: Total cholesterol goal is 200; LDL cholesterol goal is 160; HDL cholesterol goal is 40; Triglyceride goal is 150.     Patient Instructions: 1)  Please schedule a follow-up appointment in 1 month.   ]  Preventive Care Screening  Last Flu Shot:    Date:  11/25/2007    Next Due:  11/2007    Results:  Advised    Medication Administration  Injection # 1:    Medication: Depo- Medrol 80mg     Diagnosis: DEGENERATIVE DISC DISEASE (ICD-722.6)    Route: IM    Site: L thigh    Exp Date: 11/09/2009    Mfr: sicor    Patient tolerated injection without complications    Given by: Sherilyn Banker (November 25, 2007 9:52 AM)  Injection # 2:    Medication: Dexamethasone Sodium Phosphate 1mg     Diagnosis:  DEGENERATIVE DISC DISEASE (ICD-722.6)    Route: IM    Site: L thigh    Exp Date: 10/13/2008    Mfr: [employer]    Patient tolerated injection without complications    Given by: Sherilyn Banker (November 25, 2007 9:53 AM)  Orders Added: 1)  T-DG Ankle 2  Views *R* [73600] 2)  T-Comprehensive Metabolic Panel [[id number]] 3)  Est. Patient Level IV [19147] 4)  Depo- Medrol 80mg  [J1040] 5)  Dexamethasone Sodium Phosphate 1mg  [J1100] 6)  Admin of Therapeutic Inj  intramuscular or subcutaneous [[id number]]

## 2010-02-07 NOTE — Medication Information (Signed)
Summary: metoprolol   metoprolol   Imported By: Curtis Sites 05/26/2008 09:58:57  _____________________________________________________________________  External Attachment:    Type:   Image     Comment:   External Document

## 2010-02-07 NOTE — Medication Information (Signed)
Summary: ramipril   ramipril   Imported By: Curtis Sites 05/05/2007 15:49:51  _____________________________________________________________________  External Attachment:    Type:   Image     Comment:   External Document

## 2010-02-07 NOTE — Letter (Signed)
Summary: x-ray  x-ray   Imported By: Altamease Oiler 10/04/2008 08:02:00  _____________________________________________________________________  External Attachment:    Type:   Image     Comment:   External Document

## 2010-02-07 NOTE — Letter (Signed)
Summary: referrals  referrals   Imported By: Altamease Oiler 10/04/2008 08:01:39  _____________________________________________________________________  External Attachment:    Type:   Image     Comment:   External Document

## 2010-02-07 NOTE — Assessment & Plan Note (Signed)
Summary: 3 MONTH FOLLOW UP/DMS   Vital Signs:  Patient Profile:   65 Years Old Female Height:     60 inches (152.4 cm) Weight:      226 pounds BMI:     44.30 O2 Sat:      98 % Temp:     97.6 degrees F Pulse rate:   74 / minute Resp:     12 per minute BP sitting:   122 / 85  Vitals Entered By: Sherilyn Banker (April 14, 2007 8:58 AM)                 PCP:  Franchot Heidelberg, MD  Chief Complaint:  follow up visit.  History of Present Illness: Pt in for recheck.  She notes she is doing well.  She had recent mammo. This was normal. Has female care doen by GYN at [facility]. Had full check up in [place] and all well.  She notes she has bad OA. She states has stiffness in joints - ankles worst. She rates pain as bad as 10/10 at times. States pain localized. She states worse with sitting for long periods. Better with activity.  She has self treated with Aleve and uses 2 220 mg tabs twice a day. She states has used for years and helps but ankles still miserable.   She saw [facility] in February for her severe spinal stenosis in L-spine. States not ready for surgery. Has back pain nd notes does alright with Aleve. She is set to see him May. She states should be working more on exersize - she states doing stomach crunches and pushes back against wall.SHe does not walk much. She has tried to modify diet and states hard to do. Weights down 3 pounds since last visit.  She now presents.  Hypertension History:      She denies headache, chest pain, palpitations, dyspnea with exertion, orthopnea, PND, peripheral edema, visual symptoms, neurologic problems, syncope, and side effects from treatment.  She notes no problems with any antihypertensive medication side effects.        Positive major cardiovascular risk factors include female age 61 years old or older, hyperlipidemia, and hypertension.  Negative major cardiovascular risk factors include no history of diabetes, negative family history  for ischemic heart disease, and non-tobacco-user status.        Further assessment for target organ damage reveals no history of ASHD, stroke/TIA, or peripheral vascular disease.    Lipid Management History:      Positive NCEP/ATP III risk factors include female age 68 years old or older and hypertension.  Negative NCEP/ATP III risk factors include no history of early menopause without estrogen hormone replacement, non-diabetic, no family history for ischemic heart disease, non-tobacco-user status, no ASHD (atherosclerotic heart disease), no prior stroke/TIA, no peripheral vascular disease, and no history of aortic aneurysm.        The patient states that she knows about the "Therapeutic Lifestyle Change" diet.  Her compliance with the TLC diet is fair.  The patient expresses understanding of adjunctive measures for cholesterol lowering.  Adjunctive measures started by the patient include aerobic exercise, fiber, ASA, and omega-3 supplements.  She expresses no side effects from her lipid-lowering medication.  The patient denies any symptoms to suggest myopathy or liver disease from her "statin" therapy.  Comments: Labs done last week at [facility] for Dr. Domingo Sep.      Prior Medications Reviewed Using: Patient Recall  Updated Prior Medication List: HYDROCHLOROTHIAZIDE 25 MG TABS (  HYDROCHLOROTHIAZIDE) once daily METOPROLOL SUCCINATE 25 MG TB24 (METOPROLOL SUCCINATE) 1/2 once daily ALTACE 2.5 MG CAPS (RAMIPRIL) once daily NAPROSYN  SUSP (NAPROXEN SUSP) as needed ASPIRIN 81 MG TBEC (ASPIRIN) once daily SIMVASTATIN 20 MG  TABS (SIMVASTATIN) One at bedtime  Current Allergies (reviewed today): No known allergies   Past Medical History:    Reviewed history from 12/13/2005 and no changes required:       Hypertension       Low back pain       Peripheral neuropathy  Past Surgical History:    Reviewed history from 03/25/2006 and no changes required:       TAH       KNEE SURGERY LEFT   Family  History:    Reviewed history from 03/25/2006 and no changes required:       Father: Dead 14 CHF       Mother: 25  RA, DM, CHF, Hodgkins       Siblings: Sister x 1: Depression, Anxiety, Thyroid Cancer - not sure of age; 2 x brothers: OA  Social History:    Reviewed history from 03/25/2006 and no changes required:       Occupation: Dentist       Married       Former Smoker       Alcohol use-no       Drug use-no   Risk Factors: Tobacco use:  quit    Year quit:  1982    Pack-years:  3 to 7 cigs for ten years Drug use:  no Alcohol use:  no  Family History Risk Factors:    Family History of MI in females < 65 years old:  no    Family History of MI in males < 51 years old:  no  Colonoscopy History:    Date of Last Colonoscopy:  01/09/2000  Mammogram History:     Date of Last Mammogram:  02/10/2007    Results:  normal - per GYN at [facility]   PAP Smear History:     Date of Last PAP Smear:  02/10/2007    Results:  normal - per GYN at [facility] - Dr. Orlean Bradford    Review of Systems      See HPI   Physical Exam  General:     Well-developed,well-nourished,in no acute distress; alert,appropriate and cooperative throughout examination Lungs:     Normal respiratory effort, chest expands symmetrically. Lungs are clear to auscultation, no crackles or wheezes. Heart:     Normal rate and regular rhythm. S1 and S2 normal without gallop, murmur, click, rub or other extra sounds. Abdomen:     Bowel sounds positive,abdomen soft and non-tender without masses, organomegaly or hernias noted. Extremities:     No clubbing, cyanosis, edema, or deformity noted with normal full range of motion of all joints.   Psych:     Cognition and judgment appear intact. Alert and cooperative with normal attention span and concentration. No apparent delusions, illusions, hallucinations    Impression & Recommendations:  Problem # 1:  DEGENERATIVE DISC DISEASE  (ICD-722.6) Discussed. See [facility] as is. Agree with weight loss and NSAID Rx. Discussed walking 3 to 4 times a week for 30 to 40 minutes. Advised portion control. Agrees. Recheck 6 weeks and consider dietary consult if not improved. Trial Voltaren gel to ankles. May add Tylenol for breakthrough pain. Advised soe and gave handout wqith instructions.  Problem # 2:  HYPERLIPIDEMIA (ICD-272.4) Statin as is. Get most recent  labs from Dr. Skip Estimable. TLC as above. Her updated medication list for this problem includes:    Simvastatin 20 Mg Tabs (Simvastatin) ..... One at bedtime   Problem # 3:  HYPERTENSION (ICD-401.9) Stable. Rx as below. Limit salt. Advised eye exam and dental care. Her updated medication list for this problem includes:    Hydrochlorothiazide 25 Mg Tabs (Hydrochlorothiazide) ..... Once daily    Metoprolol Succinate 25 Mg Tb24 (Metoprolol succinate) .Marland Kitchen... 1/2 once daily    Altace 2.5 Mg Caps (Ramipril) ..... Once daily  Orders: UA Dipstick w/o Micro (manual) (40981)   Problem # 4:  MORBID OBESITY (ICD-278.01) See above.  Complete Medication List: 1)  Hydrochlorothiazide 25 Mg Tabs (Hydrochlorothiazide) .... Once daily 2)  Metoprolol Succinate 25 Mg Tb24 (Metoprolol succinate) .... 1/2 once daily 3)  Altace 2.5 Mg Caps (Ramipril) .... Once daily 4)  Ec-naprosyn 500 Mg Tbec (Naproxen) .... One two times a day 5)  Aspirin 81 Mg Tbec (Aspirin) .... Once daily 6)  Simvastatin 20 Mg Tabs (Simvastatin) .... One at bedtime  Hypertension Assessment/Plan:      The patient's hypertensive risk group is category B: At least one risk factor (excluding diabetes) with no target organ damage.  Today's blood pressure is 122/85.  Her blood pressure goal is < 140/90.  Lipid Assessment/Plan:      Based on NCEP/ATP III, the patient's risk factor category is "2 or more risk factors and a calculated 10 year CAD risk of < 20%".  From this information, the patient's calculated lipid goals are as  follows: Total cholesterol goal is 200; LDL cholesterol goal is 130; HDL cholesterol goal is 40; Triglyceride goal is 150.     Patient Instructions: 1)  Please schedule a follow-up appointment in 6 weeks.    Prescriptions: EC-NAPROSYN 500 MG  TBEC (NAPROXEN) One two times a day  #60 x 3   Entered and Authorized by:   Franchot Heidelberg MD   Signed by:   Franchot Heidelberg MD on 04/14/2007   Method used:   Electronically sent to ...       [facility]*       [address]       [place], [place]  [address]       Ph: [phone]       Fax: [phone]   RxID:   [id number] EC-NAPROSYN 500 MG  TBEC (NAPROXEN) One two times a day  #60 x 3   Entered and Authorized by:   Franchot Heidelberg MD   Signed by:   Franchot Heidelberg MD on 04/14/2007   Method used:   Electronically sent to ...       [facility]*       [address]       [place], [place]  [address]       Ph: [phone]       Fax: [phone]   RxID:   [id number]  ]  Preventive Care Screening  Mammogram:    Date:  02/10/2007    Next Due:  02/2008    Results:  normal - per GYN at [facility]   Pap Smear:    Date:  02/10/2007    Next Due:  02/2008    Results:  normal - per GYN at [facility] - Dr. Orlean Bradford    Laboratory Results   Urine Tests    Routine Urinalysis  Color: yellow Appearance: Clear Glucose: negative   (Normal Range: Negative) Bilirubin: negative   (Normal Range: Negative) Ketone: negative   (Normal Range: Negative) Spec. Gravity: 1.025   (Normal Range: 1.003-1.035) Blood: negative   (Normal Range: Negative) pH: 5.5   (Normal Range: 5.0-8.0) Protein: negative   (Normal Range: Negative) Urobilinogen: 0.2   (Normal Range: 0-1) Nitrite: negative   (Normal Range: Negative) Leukocyte Esterace: negative   (Normal Range: Negative)

## 2010-02-07 NOTE — Letter (Signed)
Summary: flowsheet  flowsheet   Imported By: Altamease Oiler 10/04/2008 08:00:02  _____________________________________________________________________  External Attachment:    Type:   Image     Comment:   External Document

## 2010-02-07 NOTE — Letter (Signed)
Summary: [facility] oximetry report  [facility] o[facility]   Imported By: Curtis Sites 04/28/2008 11:22:39  _____________________________________________________________________  External Attachment:    Type:   Image     Comment:   External Document

## 2010-02-07 NOTE — Letter (Signed)
Summary: visit list  visit list   Imported By: Altamease Oiler 10/04/2008 08:02:19  _____________________________________________________________________  External Attachment:    Type:   Image     Comment:   External Document

## 2010-02-07 NOTE — Letter (Signed)
Summary: misc  misc   Imported By: Altamease Oiler 10/04/2008 08:01:01  _____________________________________________________________________  External Attachment:    Type:   Image     Comment:   External Document

## 2010-02-07 NOTE — Progress Notes (Signed)
Summary: [facility] referral  Phone Note Outgoing Call   Call placed by: Sonny Dandy,  August 21, 2006 11:42 AM Summary of Call: Spoke with patient and informed her that [facility] will contact her with referral info. voices understanding, records faxed to [facility] Initial call taken by: Sonny Dandy,  August 21, 2006 11:43 AM

## 2010-02-07 NOTE — Letter (Signed)
Summary: [facility]   Imported By: Curtis Sites 03/31/2007 10:39:26  _____________________________________________________________________  External Attachment:    Type:   Image     Comment:   External Document

## 2010-02-07 NOTE — Letter (Signed)
Summary: [facility] office notes  [facility] office notes   Imported By: Donneta Romberg 12/03/2006 15:15:18  _____________________________________________________________________  External Attachment:    Type:   Image     Comment:   External Document

## 2010-02-07 NOTE — Letter (Signed)
Summary: [facility]   Imported By: Curtis Sites 04/07/2008 11:48:08  _____________________________________________________________________  External Attachment:    Type:   Image     Comment:   External Document

## 2010-02-07 NOTE — Letter (Signed)
[facility]   Imported By: Magdalene River 03/27/2006 10:38:42  _____________________________________________________________________  External Attachment:    Type:   Image     Comment:   External Document

## 2010-02-07 NOTE — Assessment & Plan Note (Signed)
Summary: 3 MONTH FOLLOW UP/ARC   Vital Signs:  Patient Profile:   65 Years Old Female Height:     60 inches (152.4 cm) Weight:      239 pounds BMI:     46.85 O2 Sat:      97 % Temp:     97.3 degrees F Pulse rate:   61 / minute Resp:     14 per minute BP sitting:   138 / 90  Pt. in pain?   no  Vitals Entered By: Sherilyn Banker (March 25, 2006 4:27 PM)                Visit Type:  Recheck PCP:  Franchot Heidelberg, MD  Chief Complaint:  legs cramps.  History of Present Illness: Pt in for recheck.  She saw Dr. Dan Europe (Cardiology) this morning and got a good report. She just had a general check up and was told she needed some routine blood work - had normal EKG. States she sees her yearly and has done well. She has not had any chest pain, shortness of breath, orthopnea and PND and palpitations.  Pt has sx of restless leg syndrome. Has been on Requip in trial pack and this helped a lot. She has run out and has not refilled.  Pt has mod to severe sleep apnea. She uses her CPAP every night. She had overnight oxymetry for nocturnal hypoxemia. Overnight oxymetry done on January 31, 2006 - O2 went down 83 %. She states she feels less tired during the day and notes she thinks she is sleeping better. She had another Oxymtry last week to look at O2 sats - result pending.  Her apetite is good. No nausea or vomitting noted. No diarhea or constipation. No blood in stools.   Hypertension History:      She denies headache, chest pain, palpitations, dyspnea with exertion, orthopnea, PND, peripheral edema, visual symptoms, neurologic problems, syncope, and side effects from treatment.        Positive major cardiovascular risk factors include female age 57 years old or older and hypertension.  Negative major cardiovascular risk factors include non-tobacco-user status.     Prior Medications (reviewed today): HYDROCHLOROTHIAZIDE 12.5 MG CAPS (HYDROCHLOROTHIAZIDE) once daily METOPROLOL SUCCINATE  25 MG TB24 (METOPROLOL SUCCINATE) 1/2 once daily ALTACE 2.5 MG CAPS (RAMIPRIL) once daily ALEVE  TABS (NAPROXEN SODIUM TABS) as needed NAPROSYN  SUSP (NAPROXEN SUSP) as needed ASPIRIN 81 MG TBEC (ASPIRIN) once daily Current Allergies (reviewed today): No known allergies   Past Surgical History:    TAH    KNEE SURGERY LEFT   Family History:    Father: Dead 68 CHF    Mother: 56  RA, DM, CHF, Hodgkins    Siblings: Sister x 1: Depression, Anxiety, Thyroid Cancer - not sure of age; 2 x brothers: OA  Social History:    Occupation: Dentist    Married    Former Smoker    Alcohol use-no    Drug use-no   Risk Factors:  Tobacco use:  quit    Year quit:  1982    Pack-years:  3 to 7 cigs for ten years Drug use:  no Alcohol use:  no   Review of Systems  General      Denies fatigue, malaise, and sweats.  CV      Denies chest pain or discomfort, swelling of feet, swelling of hands, and weight gain.  Resp      Denies chest discomfort, shortness of  breath, sputum productive, and wheezing.  GI      Denies abdominal pain, constipation, diarrhea, nausea, and vomiting.  GU      Denies abnormal vaginal bleeding, nocturia, urinary frequency, and urinary hesitancy.   Physical Exam  General:     Well-developed,well-nourished,in no acute distress; alert,appropriate and cooperative throughout examination Lungs:     Normal respiratory effort, chest expands symmetrically. Lungs are clear to auscultation, no crackles or wheezes. Heart:     Normal rate and regular rhythm. S1 and S2 normal without gallop, murmur, click, rub or other extra sounds. Abdomen:     Bowel sounds positive,abdomen soft and non-tender without masses, organomegaly or hernias noted. Extremities:     No clubbing, cyanosis, edema, or deformity noted with normal full range of motion of all joints.      Impression & Recommendations:  Problem # 1:  RESTLESS LEG SYNDROME (ICD-333.94) Discussed. Pt  did well with Requip. Would like to try Mirapex. Samples given. If work will give script. Risk and benefirt of medication use outlined.  Problem # 2:  SLEEP APNEA (ICD-780.57) Stable. CPAP as is. Await overnight oxymetry report and optomize O2 bleed in.  Problem # 3:  HYPERTENSION (ICD-401.9) Stbale. meds as is. Feel we can maximize dose and reduce number of pills and copays. Patient would like to think about this. recheck one monthand optomize. Her updated medication list for this problem includes:    Hydrochlorothiazide 12.5 Mg Caps (Hydrochlorothiazide) ..... Once daily    Metoprolol Succinate 25 Mg Tb24 (Metoprolol succinate) .Marland Kitchen... 1/2 once daily    Altace 2.5 Mg Caps (Ramipril) ..... Once daily   Problem # 4:  ANOSMIA (ICD-781.1) Stable.Slow improvement. ENT did not feel much could be offered. WIll follow.  Problem # 5:  Preventive Health Care (ICD-V70.0) Female care done by Dr. Ambrose Mantle and associates  in [place]. Had done January. Get records and update flowsheets.  Problem # 6:  MORBID OBESITY (ICD-278.01) Discussed need for weight reduction. Pt would like to have Phentermine - states TLC not cutting it. Risk and benefit including worsening HTN discussed. Given good cardiac report this am. Pt given script and handout on risks. Recheck 4 weeks and optomize. If sx develop out of ordinary stop med and call immediately.  Medications Added to Medication List This Visit: 1)  Phentermine Hcl 37.5 Mg Caps (Phentermine hcl) .... One by mouth daily  Hypertension Assessment/Plan:      The patient's hypertensive risk group is category B: At least one risk factor (excluding diabetes) with no target organ damage.  Today's blood pressure is 138/90.  Her blood pressure goal is < 140/90.   Patient Instructions: 1)  Please schedule a follow-up appointment in 1 month - sooner if needed.  Appended Document: 3 MONTH FOLLOW UP/ARC records requested from OB-GYN: Dr. Adron Bene on 03.18.08.Marland KitchenMarland Kitchenarc

## 2010-02-07 NOTE — Letter (Signed)
Summary: Med Hx  Med Hx   Imported By: Altamease Oiler 10/04/2008 07:58:03  _____________________________________________________________________  External Attachment:    Type:   Image     Comment:   External Document

## 2010-02-20 ENCOUNTER — Ambulatory Visit (HOSPITAL_COMMUNITY)
Admission: RE | Admit: 2010-02-20 | Discharge: 2010-02-20 | Disposition: A | Discharge status: Home / Self Care | Payer: BC Managed Care – PPO | Source: Ambulatory Visit | Attending: Family Medicine | Admitting: Family Medicine

## 2010-02-20 ENCOUNTER — Ambulatory Visit (HOSPITAL_COMMUNITY): Admission: RE | Admit: 2010-02-20 | Payer: Self-pay | Source: Home / Self Care | Admitting: Family Medicine

## 2010-02-20 DIAGNOSIS — Z1231 Encounter for screening mammogram for malignant neoplasm of breast: Secondary | ICD-10-CM | POA: Insufficient documentation

## 2010-03-23 LAB — POCT HEMOGLOBIN-HEMACUE: Hemoglobin: 10.1 g/dL — ABNORMAL LOW (ref 12.0–15.0)

## 2010-03-23 LAB — POCT I-STAT, CHEM 8
BUN: 11 mg/dL (ref 6–23)
Calcium, Ion: 1.22 mmol/L (ref 1.12–1.32)
Chloride: 106 mEq/L (ref 96–112)
Creatinine, Ser: 1 mg/dL (ref 0.4–1.2)
Glucose, Bld: 59 mg/dL — ABNORMAL LOW (ref 70–99)
HCT: 32 % — ABNORMAL LOW (ref 36.0–46.0)
Hemoglobin: 10.9 g/dL — ABNORMAL LOW (ref 12.0–15.0)
Potassium: 3.9 mEq/L (ref 3.5–5.1)
Sodium: 144 mEq/L (ref 135–145)
TCO2: 28 mmol/L (ref 0–100)

## 2010-05-26 NOTE — Op Note (Signed)
   NAME:  [name], [name]                       ACCOUNT NO.:  [id number]   MEDICAL RECORD NO.:  [id number]                   PATIENT TYPE:  AMB   LOCATION:  ENDO                                 FACILITY:  [facility]   PHYSICIAN:  Graylin Shiver, M.D.                DATE OF BIRTH:  Apr 13, 1945   DATE OF PROCEDURE:  08/27/2002  DATE OF DISCHARGE:                                 OPERATIVE REPORT   PROCEDURE:  Colonoscopy.   INDICATION FOR PROCEDURE:  Rectal bleeding, rule out colon lesion.   Informed consent was obtained after explanation of the risks of bleeding,  infection, and perforation.   PREMEDICATION:  Fentanyl 60 mcg IV, Versed 6 mg IV.   DESCRIPTION OF PROCEDURE:  With the patient in the left lateral decubitus  position, a rectal exam was performed and no masses were felt.  The Olympus  colonoscope was inserted into the rectum and advanced around the colon to  the cecum.  Cecal landmarks were identified.  The cecum and ascending colon  were normal.  The transverse colon was normal.  The descending colon,  sigmoid, and rectum were normal.  As the scope was brought out some small  internal hemorrhoids were seen.  She tolerated the procedure well without  complications.   IMPRESSION:  Normal colonoscopy to the cecum with some small internal  hemorrhoids.                                               Graylin Shiver, M.D.    Germain Osgood  D:  08/27/2002  T:  08/28/2002  Job:  [id number]   cc:   Gaynelle Cage, MD  [phone] [address]  [place]  [place] [address]  Fax: [phone]

## 2010-05-26 NOTE — Procedures (Signed)
[name], [name]             ACCOUNT NO.:  [id number]   MEDICAL RECORD NO.:  [id number]          PATIENT TYPE:  OUT   LOCATION:  SLEEP LAB                     FACILITY:  [facility]   PHYSICIAN:  Marcelyn Bruins, M.D. [facility] DATE OF BIRTH:  09-Apr-1945   DATE OF STUDY:  08/14/2005                              NOCTURNAL POLYSOMNOGRAM   REFERRING PHYSICIAN:  Dr. Deloris Ping   INDICATION FOR THE STUDY:  Hypersomnia with sleep apnea.   EPWORTH SCORE:  3.   SLEEP ARCHITECTURE:  The patient had total sleep time of 312 minutes with  adequate slow wave sleep but decreased REM.  Sleep onset latency was at the  upper limits of normal, and REM onset was normal, as well.  Sleep efficiency  was decreased at 77%.   RESPIRATORY DATA:  The patient underwent split night protocol where she was  found to have 46 obstructive events in the first 110 minutes of sleep.  This  gave her a respiratory disturbance index of 25 events per hour over that  time period.  Mild to moderate snoring was noted throughout, and the events  tended to be more common in the supine position.  By protocol, the patient  was then placed on a small Respironics comfort select nasal mask and  ultimately titrated to 9 cm of water pressure for control both her  obstructive events and snoring.   OXYGEN DATA:  There was O2 desaturation as low as 74% with the patient's  obstructive events.   CARDIAC DATA:  No clinically significant cardiac arrhythmias.   MOVEMENT/PARASOMNIA:  The patient was found to have small numbers of leg  jerks with no significant sleep disruption.   IMPRESSION/RECOMMENDATIONS:  Split night study reveals moderate obstructive  sleep apnea/hypopnea syndrome with a respiratory disturbance index of 25  events per hour and O2 desaturation as low as 74% during the first part of  the night.  The patient was then placed on CPAP with a small Respironics  comfort select nasal mask and ultimately titrated to a final  pressure of 9  cm for excellent control of both her obstructive events and snoring.                                           ______________________________  Marcelyn Bruins, M.D. [facility]  Diplomate, American Board of Sleep  Medicine    KC/MEDQ  D:  08/16/2005 15:18:14  T:  08/16/2005 16:43:49  Job:  [id number]

## 2011-01-23 ENCOUNTER — Other Ambulatory Visit (HOSPITAL_COMMUNITY): Payer: Self-pay | Admitting: Family Medicine

## 2011-01-23 DIAGNOSIS — Z1231 Encounter for screening mammogram for malignant neoplasm of breast: Secondary | ICD-10-CM

## 2011-02-27 ENCOUNTER — Ambulatory Visit (HOSPITAL_COMMUNITY)
Admission: RE | Admit: 2011-02-27 | Discharge: 2011-02-27 | Disposition: A | Discharge status: Home / Self Care | Payer: Medicare Other | Source: Ambulatory Visit | Attending: Family Medicine | Admitting: Family Medicine

## 2011-02-27 DIAGNOSIS — Z1231 Encounter for screening mammogram for malignant neoplasm of breast: Secondary | ICD-10-CM | POA: Insufficient documentation

## 2011-08-07 ENCOUNTER — Other Ambulatory Visit (HOSPITAL_COMMUNITY): Payer: Self-pay | Admitting: Family Medicine

## 2011-08-07 DIAGNOSIS — R200 Anesthesia of skin: Secondary | ICD-10-CM

## 2011-08-07 DIAGNOSIS — R29810 Facial weakness: Secondary | ICD-10-CM

## 2011-08-08 ENCOUNTER — Ambulatory Visit (HOSPITAL_COMMUNITY)
Admission: RE | Admit: 2011-08-08 | Discharge: 2011-08-08 | Disposition: A | Discharge status: Home / Self Care | Payer: Medicare Other | Source: Ambulatory Visit | Attending: Family Medicine | Admitting: Family Medicine

## 2011-08-08 DIAGNOSIS — R209 Unspecified disturbances of skin sensation: Secondary | ICD-10-CM | POA: Insufficient documentation

## 2011-08-08 DIAGNOSIS — R200 Anesthesia of skin: Secondary | ICD-10-CM

## 2011-08-08 DIAGNOSIS — R29810 Facial weakness: Secondary | ICD-10-CM | POA: Insufficient documentation

## 2011-12-19 ENCOUNTER — Encounter (HOSPITAL_COMMUNITY): Payer: Self-pay | Admitting: *Deleted

## 2011-12-19 ENCOUNTER — Emergency Department (HOSPITAL_COMMUNITY)
Admission: EM | Admit: 2011-12-19 | Discharge: 2011-12-20 | Disposition: A | Discharge status: Home / Self Care | Payer: Medicare Other | Attending: Emergency Medicine | Admitting: Emergency Medicine

## 2011-12-19 DIAGNOSIS — I10 Essential (primary) hypertension: Secondary | ICD-10-CM | POA: Insufficient documentation

## 2011-12-19 DIAGNOSIS — Y929 Unspecified place or not applicable: Secondary | ICD-10-CM | POA: Insufficient documentation

## 2011-12-19 DIAGNOSIS — W1809XA Striking against other object with subsequent fall, initial encounter: Secondary | ICD-10-CM | POA: Insufficient documentation

## 2011-12-19 DIAGNOSIS — S01501A Unspecified open wound of lip, initial encounter: Secondary | ICD-10-CM | POA: Insufficient documentation

## 2011-12-19 DIAGNOSIS — M129 Arthropathy, unspecified: Secondary | ICD-10-CM | POA: Insufficient documentation

## 2011-12-19 DIAGNOSIS — Z23 Encounter for immunization: Secondary | ICD-10-CM | POA: Insufficient documentation

## 2011-12-19 DIAGNOSIS — Z7982 Long term (current) use of aspirin: Secondary | ICD-10-CM | POA: Insufficient documentation

## 2011-12-19 DIAGNOSIS — Y9389 Activity, other specified: Secondary | ICD-10-CM | POA: Insufficient documentation

## 2011-12-19 DIAGNOSIS — Z79899 Other long term (current) drug therapy: Secondary | ICD-10-CM | POA: Insufficient documentation

## 2011-12-19 DIAGNOSIS — S01511A Laceration without foreign body of lip, initial encounter: Secondary | ICD-10-CM

## 2011-12-19 HISTORY — DX: Essential (primary) hypertension: I10

## 2011-12-19 HISTORY — DX: Unspecified osteoarthritis, unspecified site: M19.90

## 2011-12-19 MED ORDER — LIDOCAINE HCL (PF) 1 % IJ SOLN
5.0000 mL | Freq: Once | INTRAMUSCULAR | Status: AC
  Administered 2011-12-19: 5 mL via INTRADERMAL
  Filled 2011-12-19: qty 5

## 2011-12-19 NOTE — ED Provider Notes (Signed)
Pt seen with PA She is here s/p fall with lip laceration and it was a mechanical fall She now feels "weak in shoulders" but denies cp/sob or headache/vomiting Will need extensive repair of lip lac    Date: 12/19/2011  Rate: 70  Rhythm: normal sinus rhythm  QRS Axis: normal  Intervals: normal  ST/T Wave abnormalities: nonspecific ST changes  Conduction Disutrbances:none  Narrative Interpretation:   Old EKG Reviewed: none available at time of interpretation    Joya Gaskins, MD 12/19/11 2317

## 2011-12-19 NOTE — ED Notes (Signed)
Laceration to lower lip extending to border, patient c/o feeling weak across her shoulders

## 2011-12-19 NOTE — ED Notes (Addendum)
Pt reports falling and cutting lip on bed railing.  Bleeding controlled at present time.  Unknown when last tetanus vaccine was

## 2011-12-19 NOTE — ED Notes (Signed)
T. Triplett at bedside to suture.

## 2011-12-19 NOTE — ED Provider Notes (Signed)
History     CSN: [id number]  Arrival date & time 12/19/11  2147   First MD Initiated Contact with Patient 12/19/11 2221      No chief complaint on file.   (Consider location/radiation/quality/duration/timing/severity/associated sxs/prior treatment) HPI Comments: Patient c/o laceration to the lower lip.  States she tripped over a rug and struck her mouth on the bed railing.  She denies any symptoms prior to the fall.   She also c/o "weakness" to her bilateral shoulders.  She denies head injury, chest pain, diaphoresis, numbness, dizziness, headache vomiting, LOC or visual changes.    Patient is a 66 y.o. female presenting with skin laceration. The history is provided by the patient.  Laceration  The incident occurred 1 to 2 hours ago. Pain location: lower lip. The laceration is 2 cm in size. The laceration mechanism was a a blunt object. The pain is mild. The pain has been constant since onset. She reports no foreign bodies present. Her tetanus status is unknown.    Past Medical History  Diagnosis Date  . Hypertension   . Arthritis     Past Surgical History  Procedure Date  . Joint replacement   . Abdominal hysterectomy   . Hand surgery     History reviewed. No pertinent family history.  History  Substance Use Topics  . Smoking status: Never Smoker   . Smokeless tobacco: Not on file  . Alcohol Use: No    OB History    Grav Para Term Preterm Abortions TAB SAB Ect Mult Living                  Review of Systems  Constitutional: Negative for fever, diaphoresis, activity change and appetite change.  HENT: Positive for mouth sores. Negative for sore throat, facial swelling, drooling, trouble swallowing, neck pain, neck stiffness and dental problem.   Eyes: Negative for visual disturbance.  Respiratory: Negative for chest tightness and shortness of breath.   Cardiovascular: Negative for chest pain.  Gastrointestinal: Negative for nausea, vomiting and abdominal pain.    Genitourinary: Negative for dysuria.  Musculoskeletal: Negative for back pain and gait problem.  Skin: Positive for wound.  Neurological: Negative for dizziness, weakness, light-headedness, numbness and headaches.  Psychiatric/Behavioral: Negative for confusion.  All other systems reviewed and are negative.    Allergies  Review of patient's allergies indicates no known allergies.  Home Medications   Current Outpatient Rx  Name  Route  Sig  Dispense  Refill  . ASPIRIN 81 MG PO TABS   Oral   Take 81 mg by mouth daily. *Takes occasionally*         . HYDROCHLOROTHIAZIDE 25 MG PO TABS   Oral   Take 25 mg by mouth daily.         Marland Kitchen METHOTREXATE (ANTI-RHEUMATIC) 2.5 MG PO TABS   Oral   Take 15 mg by mouth once a week. On Saturdays         . NAPROXEN 500 MG PO TABS   Oral   Take 500 mg by mouth 2 (two) times daily.         Marland Kitchen RAMIPRIL 5 MG PO CAPS   Oral   Take 5 mg by mouth daily.         . TRAMADOL HCL 50 MG PO TABS   Oral   Take 50-100 mg by mouth every 6 (six) hours as needed. For pain           BP 151/73  Pulse 78  Temp 98.1 F (36.7 C) (Oral)  Resp 20  Ht 5\' 1"  (1.549 m)  Wt 216 lb (97.977 kg)  BMI 40.81 kg/m2  SpO2 99%  Physical Exam  Nursing note and vitals reviewed. Constitutional: She is oriented to person, place, and time. She appears well-developed and well-nourished. No distress.  HENT:  Head: Normocephalic and atraumatic. No trismus in the jaw.  Nose: Nose normal. No sinus tenderness. No epistaxis.  Mouth/Throat: Uvula is midline, oropharynx is clear and moist and mucous membranes are normal. No oral lesions. Normal dentition. Lacerations present. No dental caries.         Flap type laceration that extends along the [place] border but does not extend through it.  No dental injuries or trismus.    Neck: Normal range of motion. Neck supple.  Cardiovascular: Normal rate, regular rhythm, normal heart sounds and intact distal pulses.    No murmur heard. Pulmonary/Chest: Effort normal and breath sounds normal. No respiratory distress. She has no wheezes. She has no rales. She exhibits no tenderness.  Musculoskeletal: Normal range of motion.  Neurological: She is alert and oriented to person, place, and time. She exhibits normal muscle tone. Coordination normal.  Skin: Skin is warm and dry. No rash noted.    ED Course  Procedures (including critical care time)  Labs Reviewed - No data to display No results found.      MDM   Patient is ambulatory, no focal neuro deficits, moves all extremities well.  EKG reviewed by Dr. Bebe Shaggy.     LACERATION REPAIR Performed by: Martine Trageser L. Authorized by: Maxwell Caul Consent: Verbal consent obtained. Risks and benefits: risks, benefits and alternatives were discussed Consent given by: patient Patient identity confirmed: provided demographic data Prepped and Draped in normal sterile fashion Wound explored  Laceration Location: mid lower lip  Laceration Length: 2 cm, irregular shaped  No Foreign Bodies seen or palpated  Anesthesia: local infiltration  Local anesthetic: lidocaine 1 % epinephrine  Anesthetic total: 2 ml  Irrigation method: syringe Amount of cleaning: standard  Skin closure: 6-0 vicryl  Number of sutures: 9  Technique: subcuticular  Patient tolerance: Patient tolerated the procedure well with no immediate complications.   Complex flap type laceration to the lower lip that extends along the [place] border but does not extend through the border.      TDaP was updated.  patient agrees to soft foods and fluids.  Return to ER if needed.    Magenta Schmiesing L. Briawna Carver, PA 12/20/11 0016  Keyla Milone L. [place], [place] 12/20/11 [phone]

## 2011-12-20 MED ORDER — TETANUS-DIPHTH-ACELL PERTUSSIS 5-2.5-18.5 LF-MCG/0.5 IM SUSP
0.5000 mL | Freq: Once | INTRAMUSCULAR | Status: AC
  Administered 2011-12-20: 0.5 mL via INTRAMUSCULAR
  Filled 2011-12-20: qty 0.5

## 2011-12-22 NOTE — ED Provider Notes (Signed)
Medical screening examination/treatment/procedure(s) were conducted as a shared visit with non-physician practitioner(s) and myself.  I personally evaluated the patient during the encounter   Joya Gaskins, MD 12/22/11 [phone]

## 2012-02-06 ENCOUNTER — Other Ambulatory Visit (HOSPITAL_COMMUNITY): Payer: Self-pay | Admitting: Family Medicine

## 2012-02-06 DIAGNOSIS — Z1231 Encounter for screening mammogram for malignant neoplasm of breast: Secondary | ICD-10-CM

## 2012-02-28 ENCOUNTER — Ambulatory Visit (HOSPITAL_COMMUNITY)
Admission: RE | Admit: 2012-02-28 | Discharge: 2012-02-28 | Disposition: A | Discharge status: Home / Self Care | Payer: Medicare Other | Source: Ambulatory Visit | Attending: Family Medicine | Admitting: Family Medicine

## 2012-02-28 DIAGNOSIS — Z1231 Encounter for screening mammogram for malignant neoplasm of breast: Secondary | ICD-10-CM | POA: Insufficient documentation

## 2012-09-13 ENCOUNTER — Encounter (HOSPITAL_COMMUNITY): Payer: Self-pay | Admitting: *Deleted

## 2012-09-13 ENCOUNTER — Emergency Department (HOSPITAL_COMMUNITY)
Admission: EM | Admit: 2012-09-13 | Discharge: 2012-09-13 | Disposition: A | Discharge status: Home / Self Care | Payer: Medicare Other | Attending: Emergency Medicine | Admitting: Emergency Medicine

## 2012-09-13 ENCOUNTER — Emergency Department (HOSPITAL_COMMUNITY): Payer: Medicare Other

## 2012-09-13 DIAGNOSIS — M129 Arthropathy, unspecified: Secondary | ICD-10-CM | POA: Insufficient documentation

## 2012-09-13 DIAGNOSIS — D649 Anemia, unspecified: Secondary | ICD-10-CM | POA: Insufficient documentation

## 2012-09-13 DIAGNOSIS — R079 Chest pain, unspecified: Secondary | ICD-10-CM | POA: Insufficient documentation

## 2012-09-13 DIAGNOSIS — Z7982 Long term (current) use of aspirin: Secondary | ICD-10-CM | POA: Insufficient documentation

## 2012-09-13 DIAGNOSIS — I10 Essential (primary) hypertension: Secondary | ICD-10-CM | POA: Insufficient documentation

## 2012-09-13 DIAGNOSIS — R5381 Other malaise: Secondary | ICD-10-CM | POA: Insufficient documentation

## 2012-09-13 DIAGNOSIS — Z79899 Other long term (current) drug therapy: Secondary | ICD-10-CM | POA: Insufficient documentation

## 2012-09-13 DIAGNOSIS — Z87891 Personal history of nicotine dependence: Secondary | ICD-10-CM | POA: Insufficient documentation

## 2012-09-13 LAB — CBC WITH DIFFERENTIAL/PLATELET
Basophils Absolute: 0 10*3/uL (ref 0.0–0.1)
Basophils Relative: 0 % (ref 0–1)
Eosinophils Absolute: 0.2 10*3/uL (ref 0.0–0.7)
Eosinophils Relative: 2 % (ref 0–5)
HCT: 28.1 % — ABNORMAL LOW (ref 36.0–46.0)
Hemoglobin: 9.3 g/dL — ABNORMAL LOW (ref 12.0–15.0)
Lymphocytes Relative: 13 % (ref 12–46)
Lymphs Abs: 1 10*3/uL (ref 0.7–4.0)
MCH: 29.8 pg (ref 26.0–34.0)
MCHC: 33.1 g/dL (ref 30.0–36.0)
MCV: 90.1 fL (ref 78.0–100.0)
Monocytes Absolute: 0 10*3/uL — ABNORMAL LOW (ref 0.1–1.0)
Monocytes Relative: 1 % — ABNORMAL LOW (ref 3–12)
Neutro Abs: 6.3 10*3/uL (ref 1.7–7.7)
Neutrophils Relative %: 84 % — ABNORMAL HIGH (ref 43–77)
Platelets: 154 10*3/uL (ref 150–400)
RBC: 3.12 MIL/uL — ABNORMAL LOW (ref 3.87–5.11)
RDW: 19.6 % — ABNORMAL HIGH (ref 11.5–15.5)
WBC: 7.5 10*3/uL (ref 4.0–10.5)

## 2012-09-13 LAB — BASIC METABOLIC PANEL
BUN: 20 mg/dL (ref 6–23)
CO2: 24 mEq/L (ref 19–32)
Calcium: 9.4 mg/dL (ref 8.4–10.5)
Chloride: 102 mEq/L (ref 96–112)
Creatinine, Ser: 0.98 mg/dL (ref 0.50–1.10)
GFR calc Af Amer: 68 mL/min — ABNORMAL LOW (ref >90)
GFR calc non Af Amer: 59 mL/min — ABNORMAL LOW (ref >90)
Glucose, Bld: 80 mg/dL (ref 70–99)
Potassium: 3.5 mEq/L (ref 3.5–5.1)
Sodium: 138 mEq/L (ref 135–145)

## 2012-09-13 LAB — TROPONIN I: Troponin I: <0.3 ng/mL (ref <0.30)

## 2012-09-13 MED ORDER — CYCLOBENZAPRINE HCL 10 MG PO TABS: 10.0000 mg | ORAL_TABLET | Freq: Two times a day (BID) | ORAL | Status: DC | PRN

## 2012-09-13 NOTE — ED Notes (Signed)
Pt c/o mid center chest pain, thoracici back pain that is across entire upper back area that started two days ago, pain is worse with deep breathes, did have an episode of nausea and dizziness yesterday while she was "doing stuff". Admits to cough that is "slightly productive".

## 2012-09-13 NOTE — ED Provider Notes (Addendum)
CSN: [id number]     Arrival date & time 09/13/12  4098 History  This chart was scribed for Donnetta Hutching, MD by Quintella Reichert, ED scribe.  This patient was seen in room APA02/APA02 and the patient's care was started at 9:08 AM.  Chief Complaint  Patient presents with  . Chest Pain   The history is provided by the patient. No language interpreter was used.    HPI Comments: [name] is a 67 y.o. female with h/o HTN and arthritis who presents to the Emergency Department complaining of 2 days of persistent central CP that radiates straight through to her back.  Pain is described as dull and is exacerbated by breathing.  It is rated at a severity of 6/10.  Pt states "I can't get a full breath because it catches it right there."   She also reports that she has felt fatigued.  No frank dyspnea. She denies h/o heart problems. No exertional dyspnea, fever.  Severity is mild to moderate.  PCP is Dr. Mirna Mires   Past Medical History  Diagnosis Date  . Hypertension   . Arthritis     Past Surgical History  Procedure Laterality Date  . Joint replacement    . Abdominal hysterectomy    . Hand surgery      No family history on file.   History  Substance Use Topics  . Smoking status: Former Games developer  . Smokeless tobacco: Not on file  . Alcohol Use: No    OB History   Grav Para Term Preterm Abortions TAB SAB Ect Mult Living                   Review of Systems A complete 10 system review of systems was obtained and all systems are negative except as noted in the HPI and PMH.    Allergies  Review of patient's allergies indicates no known allergies.  Home Medications   Current Outpatient Rx  Name  Route  Sig  Dispense  Refill  . aspirin 81 MG tablet   Oral   Take 81 mg by mouth daily. *Takes occasionally*         . hydrochlorothiazide (HYDRODIURIL) 25 MG tablet   Oral   Take 25 mg by mouth daily.         . methotrexate (RHEUMATREX) 2.5 MG tablet   Oral   Take  15 mg by mouth once a week. On Saturdays         . naproxen (NAPROSYN) 500 MG tablet   Oral   Take 500 mg by mouth 2 (two) times daily.         . ramipril (ALTACE) 5 MG capsule   Oral   Take 5 mg by mouth daily.         . traMADol (ULTRAM) 50 MG tablet   Oral   Take 50-100 mg by mouth every 6 (six) hours as needed. For pain          BP 142/66  Pulse 88  Temp(Src) 98.9 F (37.2 C) (Oral)  Resp 16  Ht 5\' 1"  (1.549 m)  Wt 205 lb (92.987 kg)  BMI 38.75 kg/m2  SpO2 98%  Physical Exam  Nursing note and vitals reviewed. Constitutional: She is oriented to person, place, and time. She appears well-developed and well-nourished.  HENT:  Head: Normocephalic and atraumatic.  Eyes: Conjunctivae and EOM are normal. Pupils are equal, round, and reactive to light.  Neck: Normal range of motion.  Neck supple.  Cardiovascular: Normal rate, regular rhythm and normal heart sounds.   Pulmonary/Chest: Effort normal and breath sounds normal.  Abdominal: Soft. Bowel sounds are normal.  Musculoskeletal: Normal range of motion.  Neurological: She is alert and oriented to person, place, and time.  Skin: Skin is warm and dry.  Psychiatric: She has a normal mood and affect.    ED Course  Procedures (including critical care time)  DIAGNOSTIC STUDIES: Oxygen Saturation is 98% on room air, normal by my interpretation.    COORDINATION OF CARE: 9:12 AM-Discussed treatment plan which includes EKG, CXR and labs with pt at bedside and pt agreed to plan.    Results for orders placed during the hospital encounter of 09/13/12  CBC WITH DIFFERENTIAL      Result Value Range   WBC 7.5  4.0 - 10.5 K/uL   RBC 3.12 (*) 3.87 - 5.11 MIL/uL   Hemoglobin 9.3 (*) 12.0 - 15.0 g/dL   HCT 16.1 (*) 09.6 - 04.5 %   MCV 90.1  78.0 - 100.0 fL   MCH 29.8  26.0 - 34.0 pg   MCHC 33.1  30.0 - 36.0 g/dL   RDW 40.9 (*) 81.1 - 91.4 %   Platelets 154  150 - 400 K/uL   Neutrophils Relative % 84 (*) 43 - 77 %    Neutro Abs 6.3  1.7 - 7.7 K/uL   Lymphocytes Relative 13  12 - 46 %   Lymphs Abs 1.0  0.7 - 4.0 K/uL   Monocytes Relative 1 (*) 3 - 12 %   Monocytes Absolute 0.0 (*) 0.1 - 1.0 K/uL   Eosinophils Relative 2  0 - 5 %   Eosinophils Absolute 0.2  0.0 - 0.7 K/uL   Basophils Relative 0  0 - 1 %   Basophils Absolute 0.0  0.0 - 0.1 K/uL  BASIC METABOLIC PANEL      Result Value Range   Sodium 138  135 - 145 mEq/L   Potassium 3.5  3.5 - 5.1 mEq/L   Chloride 102  96 - 112 mEq/L   CO2 24  19 - 32 mEq/L   Glucose, Bld 80  70 - 99 mg/dL   BUN 20  6 - 23 mg/dL   Creatinine, Ser 7.82  0.50 - 1.10 mg/dL   Calcium 9.4  8.4 - 95.6 mg/dL   GFR calc non Af Amer 59 (*) >90 mL/min   GFR calc Af Amer 68 (*) >90 mL/min  TROPONIN I      Result Value Range   Troponin I <0.30  <0.30 ng/mL     Dg Chest 2 View  09/13/2012   *RADIOLOGY REPORT*  Clinical Data: Shortness of breath and weakness.  CHEST - 2 VIEW  Comparison: Chest x-ray 01/15/2010.  Findings: Lung volumes are normal.  No consolidative airspace disease.  No pleural effusions.  No pneumothorax.  No pulmonary nodule or mass noted.  Pulmonary vasculature and the cardiomediastinal silhouette are within normal limits.  IMPRESSION: 1. No radiographic evidence of acute cardiopulmonary disease.   Original Report Authenticated By: Trudie Reed, M.D.     Date: 09/13/2012  Rate: 93  Rhythm: normal sinus rhythm  QRS Axis: normal  Intervals: normal  ST/T Wave abnormalities: inverted T waves ant lat  Conduction Disutrbances: none  Narrative Interpretation: unremarkable     MDM  No diagnosis found. troponin and EKG were normal. Anemia noted. Patient has primary care followup.  Return if worse in any way.  I personally performed the services described in this documentation, which was scribed in my presence. The recorded information has been reviewed and is accurate.    Donnetta Hutching, MD 09/13/12 1426  Donnetta Hutching, MD 09/13/12 [phone]

## 2013-01-27 ENCOUNTER — Other Ambulatory Visit (HOSPITAL_COMMUNITY): Payer: Self-pay | Admitting: Family Medicine

## 2013-01-27 DIAGNOSIS — Z1231 Encounter for screening mammogram for malignant neoplasm of breast: Secondary | ICD-10-CM

## 2013-03-06 ENCOUNTER — Ambulatory Visit (HOSPITAL_COMMUNITY): Payer: Medicare Other

## 2013-03-26 ENCOUNTER — Other Ambulatory Visit (HOSPITAL_COMMUNITY): Payer: Self-pay | Admitting: Family Medicine

## 2013-03-26 ENCOUNTER — Ambulatory Visit (HOSPITAL_COMMUNITY)
Admission: RE | Admit: 2013-03-26 | Discharge: 2013-03-26 | Disposition: A | Discharge status: Home / Self Care | Payer: Medicare Other | Source: Ambulatory Visit | Attending: Family Medicine | Admitting: Family Medicine

## 2013-03-26 DIAGNOSIS — Z1231 Encounter for screening mammogram for malignant neoplasm of breast: Secondary | ICD-10-CM

## 2014-02-08 DIAGNOSIS — Z9289 Personal history of other medical treatment: Secondary | ICD-10-CM

## 2014-02-08 HISTORY — DX: Personal history of other medical treatment: Z92.89

## 2014-03-17 ENCOUNTER — Ambulatory Visit (HOSPITAL_COMMUNITY)
Admission: RE | Admit: 2014-03-17 | Discharge: 2014-03-17 | Disposition: A | Discharge status: Home / Self Care | Payer: Medicare Other | Source: Ambulatory Visit | Attending: Orthopedic Surgery | Admitting: Orthopedic Surgery

## 2014-03-17 ENCOUNTER — Encounter (HOSPITAL_COMMUNITY): Payer: Self-pay

## 2014-03-17 ENCOUNTER — Encounter (HOSPITAL_COMMUNITY)
Admission: RE | Admit: 2014-03-17 | Discharge: 2014-03-17 | Disposition: A | Discharge status: Home / Self Care | Payer: Medicare Other | Source: Ambulatory Visit | Attending: Orthopaedic Surgery | Admitting: Orthopaedic Surgery

## 2014-03-17 DIAGNOSIS — Z01818 Encounter for other preprocedural examination: Secondary | ICD-10-CM

## 2014-03-17 HISTORY — DX: Sleep apnea, unspecified: G47.30

## 2014-03-17 HISTORY — DX: Anemia, unspecified: D64.9

## 2014-03-17 HISTORY — DX: Personal history of other medical treatment: Z92.89

## 2014-03-17 HISTORY — DX: Reserved for inherently not codable concepts without codable children: IMO0001

## 2014-03-17 HISTORY — DX: Cardiac murmur, unspecified: R01.1

## 2014-03-17 LAB — COMPREHENSIVE METABOLIC PANEL
ALT: 14 U/L (ref 0–35)
AST: 21 U/L (ref 0–37)
Albumin: 3.5 g/dL (ref 3.5–5.2)
Alkaline Phosphatase: 113 U/L (ref 39–117)
Anion gap: 8 (ref 5–15)
BUN: 20 mg/dL (ref 6–23)
CO2: 28 mmol/L (ref 19–32)
Calcium: 9.4 mg/dL (ref 8.4–10.5)
Chloride: 104 mmol/L (ref 96–112)
Creatinine, Ser: 0.98 mg/dL (ref 0.50–1.10)
GFR calc Af Amer: 67 mL/min — ABNORMAL LOW (ref >90)
GFR calc non Af Amer: 58 mL/min — ABNORMAL LOW (ref >90)
Glucose, Bld: 81 mg/dL (ref 70–99)
Potassium: 3.9 mmol/L (ref 3.5–5.1)
Sodium: 140 mmol/L (ref 135–145)
Total Bilirubin: 0.6 mg/dL (ref 0.3–1.2)
Total Protein: 8 g/dL (ref 6.0–8.3)

## 2014-03-17 LAB — CBC WITH DIFFERENTIAL/PLATELET
Basophils Absolute: 0.1 10*3/uL (ref 0.0–0.1)
Basophils Relative: 1 % (ref 0–1)
Eosinophils Absolute: 0.2 10*3/uL (ref 0.0–0.7)
Eosinophils Relative: 2 % (ref 0–5)
HCT: 37.8 % (ref 36.0–46.0)
Hemoglobin: 12.1 g/dL (ref 12.0–15.0)
Lymphocytes Relative: 33 % (ref 12–46)
Lymphs Abs: 3 10*3/uL (ref 0.7–4.0)
MCH: 28.9 pg (ref 26.0–34.0)
MCHC: 32 g/dL (ref 30.0–36.0)
MCV: 90.4 fL (ref 78.0–100.0)
Monocytes Absolute: 0.5 10*3/uL (ref 0.1–1.0)
Monocytes Relative: 6 % (ref 3–12)
Neutro Abs: 5.4 10*3/uL (ref 1.7–7.7)
Neutrophils Relative %: 58 % (ref 43–77)
Platelets: 274 10*3/uL (ref 150–400)
RBC: 4.18 MIL/uL (ref 3.87–5.11)
RDW: 13.2 % (ref 11.5–15.5)
WBC: 9.2 10*3/uL (ref 4.0–10.5)

## 2014-03-17 LAB — SURGICAL PCR SCREEN
MRSA, PCR: NEGATIVE
Staphylococcus aureus: NEGATIVE

## 2014-03-17 LAB — URINALYSIS, ROUTINE W REFLEX MICROSCOPIC
Bilirubin Urine: NEGATIVE
Glucose, UA: NEGATIVE mg/dL
Hgb urine dipstick: NEGATIVE
Ketones, ur: NEGATIVE mg/dL
Leukocytes, UA: NEGATIVE
Nitrite: NEGATIVE
Protein, ur: NEGATIVE mg/dL
Specific Gravity, Urine: 1.023 (ref 1.005–1.030)
Urobilinogen, UA: 0.2 mg/dL (ref 0.0–1.0)
pH: 5 (ref 5.0–8.0)

## 2014-03-17 LAB — TYPE AND SCREEN
ABO/RH(D): O POS
Antibody Screen: NEGATIVE

## 2014-03-17 LAB — PROTIME-INR
INR: 1.05 (ref 0.00–1.49)
Prothrombin Time: 13.8 seconds (ref 11.6–15.2)

## 2014-03-17 LAB — APTT: aPTT: 37 seconds (ref 24–37)

## 2014-03-17 NOTE — Progress Notes (Signed)
Pt. Cleared for surgery by Dr. Einar Gip, requested records including OV note, stress tes & ekg.

## 2014-03-17 NOTE — Pre-Procedure Instructions (Signed)
Kerisha S Janes  03/17/2014   Your procedure is scheduled on:  03/25/2014  Report to [facility] Admitting at 10:00 AM.  Call this number if you have problems the morning of surgery: [phone]   Remember:   Do not eat food or drink liquids after midnight. On WEDNESDAY  Take these medicines the morning of surgery with A SIP OF WATER: tramadol is OK or Tylenol day of surgery if needed    Do not wear jewelry, make-up or nail polish.   Do not wear lotions, powders, or perfumes. You may wear deodorant.   Do not shave 48 hours prior to surgery.    Do not bring valuables to the hospital.  [facility] is not responsible                  for any belongings or valuables.               Contacts, dentures or bridgework may not be worn into surgery.   Leave suitcase in the car. After surgery it may be brought to your room.   For patients admitted to the hospital, discharge time is determined by your                treatment team.               Patients discharged the day of surgery will not be allowed to drive  Home.   Name and phone number of your driver: with family  Special Instructions: Special Instructions: [place] - Preparing for Surgery  Before surgery, you can play an important role.  Because skin is not sterile, your skin needs to be as free of germs as possible.  You can reduce the number of germs on you skin by washing with CHG (chlorahexidine gluconate) soap before surgery.  CHG is an antiseptic cleaner which kills germs and bonds with the skin to continue killing germs even after washing.  Please DO NOT use if you have an allergy to CHG or antibacterial soaps.  If your skin becomes reddened/irritated stop using the CHG and inform your nurse when you arrive at Short Stay.  Do not shave (including legs and underarms) for at least 48 hours prior to the first CHG shower.  You may shave your face.  Please follow these instructions carefully:   1.  Shower with CHG  Soap the night before surgery and the  morning of Surgery.  2.  If you choose to wash your hair, wash your hair first as usual with your  normal shampoo.  3.  After you shampoo, rinse your hair and body thoroughly to remove the  Shampoo.  4.  Use CHG as you would any other liquid soap.  You can apply chg directly to the skin and wash gently with scrungie or a clean washcloth.  5.  Apply the CHG Soap to your body ONLY FROM THE NECK DOWN.    Do not use on open wounds or open sores.  Avoid contact with your eyes, ears, mouth and genitals (private parts).  Wash genitals (private parts)   with your normal soap.  6.  Wash thoroughly, paying special attention to the area where your surgery will be performed.  7.  Thoroughly rinse your body with warm water from the neck down.  8.  DO NOT shower/wash with your normal soap after using and rinsing off   the CHG Soap.  9.  Pat yourself dry with a clean  towel.            10.  Wear clean pajamas.            11.  Place clean sheets on your bed the night of your first shower and do not sleep with pets.  Day of Surgery  Do not apply any lotions/deodorants the morning of surgery.  Please wear clean clothes to the hospital/surgery center.   Please read over the following fact sheets that you were given: Pain Booklet, Coughing and Deep Breathing, Blood Transfusion Information, Total Joint Packet, MRSA Information and Surgical Site Infection Prevention

## 2014-03-21 NOTE — H&P (Signed)
CHIEF COMPLAINT:  Painful left knee.    HISTORY OF PRESENT ILLNESS:  [name] is a very pleasant  69 year old African American female who is seen today for evaluation of bilateral knee pain, left greater than right.  She has had a history of rheumatoid arthritis and has been treated with Humira as part of her treatment plan.  She has been tried with other rheumatologic medications and not with much relief in regard to her methotrexate, etc.  She did have an arthroscopic debridement performed on the left knee by Dr. Shellia Carwin back in 1993.  She has had problems with her knees for a long time.  Many years ago she apparently had some problems with a dislocating patella.  She has now gotten to the point where she has to use a cane, because her knee pain is so problematic for her.  Her left is much more symptomatic than her right.  She does have swelling as well as popping and clicking and aching pain.  Her symptoms have worsened to the point where she is having difficulty with activities of daily living.  She has nighttime pain.  She has pain with ambulation also.  She denies any neurovascular compromise.  She does not really remember much in the way of any history of injury or trauma.  She is seen today for evaluation.    PAST SURGICAL HISTORY:   1.  She did have a bunion removed.  2.  In 1992 hysterectomy, that of a total abdominal hysterectomy and bilateral salpingo-oophorectomy. 3.  In 1993 for left knee surgery. 4.  In 2003 for left foot surgery.  5.  In 2013 for a triple arthrodesis by Dr. Doran Durand.     HOSPITALIZATIONS: 1.  Childbirth x2 in 68 and 1976. 2.  In 2012 for dehydration.    CURRENT MEDICATIONS: 1.  Naproxen 500 b.i.d.  2.  Ramipril 2.5 mg daily. 3.  Folic acid 1 mg at two tablets each day.  4.  Tramadol 50 mg 1-2 tablets as needed for pain.  5.  Hydrochlorothiazide 25 mg daily.  6.  Iron 65 mg daily.  7.  Aspirin 81 mg occasionally.  8.  Vitamin daily. 9.  Humira 40 mg twice a  month and that was stopped the first week of February.     ALLERGIES:  None known.   REVIEW OF SYSTEMS:  A 14 point review of systems reveals glasses.  She has had hypertension for 16 years and is presently on a antihypertensive.  She does have a history of a heart murmur and she is not exactly sure what it is or what the cause is, but apparently it is asymptomatic.  She does have iron deficiency anemia and is on iron tablets.  Ankle swelling, especially in the one with the arthrodesis.  She does have sleep apnea and uses CPAP.     FAMILY HISTORY:  Positive for a mother who remains alive at 9.  She has heart disease, hypertension, diabetes, strokes and a history of Hodgkin's cancer.  Her father died at age 61 from congestive heart failure.  He too had heart disease, but had gout, hypertension and diabetes.  She has two brothers, one age 9 and one age 73 and healthy.  She has a sister, age 2, who has gout.  She also has a history of cancer.     SOCIAL HISTORY:  Saloni is a 69 year old African American female who is married and retired from [employer].  She quit smoking cigarettes in  1982.  She smoked for seven years and one pack lasted her for three days.  She denies the use of tobacco.     PHYSICAL EXAMINATION:  Today reveals a 69 year old [place] American female, well developed, well nourished, alert and cooperative in moderate distress secondary to left greater than right knee pain.  She is 5 feet and 1/2 inch and weighs 215 pounds.  BMI is 41.3.     Vital signs:  Temperature 98.6, pulse 86, respiration 14, blood pressure 127/69.   Head is normocephalic. Eyes:  Pupils equal, round and reactive to light and accommodation with extraocular movements are intact.  Ears, nose and throat were benign. Neck was supple and no carotid bruits.  Chest had good expansion.   Lungs were essentially clear.   Cardiac:  Regular rhythm and rate.  Normal S1 and S2.  She has a grade 1-2 systolic murmur.  No  radiation. Pulses were 1+ bilateral and symmetric in the posterior tibs bilaterally.   Abdomen was obese, soft and nontender.  No masses palpable.  Normal bowel sounds present.   Genital, rectal and breast exam are not indicated for an orthopedic evaluation.  She is oriented x3 and cranial nerves II through XII grossly intact. Musculoskeletal:  She had range of motion from 5 degrees to about 95 degrees.  She does have some crepitus with range of motion.  I am unable to really tell if she has an effusion at this time.  She does have some pseudolaxity with valgus stressing.  Periarticular spurs palpated medially.     RADIOGRAPHS:  Radiographic studies reveal bone on bone medial compartment OA with tricompartment osteoarthritis noted.  Multiple loose bodies posteriorly.  A large amount of end-stage OA in the patellofemoral joint on the lateral.     CLINICAL IMPRESSION:   1.  End-stage OA of the left knee.   2.  Exogenous obesity.   3.  Hypertension. 4.  Sleep apnea. 5.  Iron deficiency anemia.     RECOMMENDATIONS:  I have reviewed a note from Neldon Labella, NP of [facility] who feels that she is low-risk perioperative from a cardiac standpoint.  I also received a note from Dr. Berdine Addison, who feels that she is also, from a medical standpoint, cleared for surgery for a total joint replacement.     At this time, since we have noted clearances from cardiology and medicine, our plan is to proceed with a left total knee arthroplasty.  The procedure, risks and benefits were fully explained to the patient and she is understanding.  I have gone over this using appropriate models.  All questions were answered in detail.  She is fully understanding and would like to proceed in the very near future with left total knee arthroplasty.    Mike Craze [place], [place] [phone]  03/21/2014 11:56 PM

## 2014-03-24 MED ORDER — SODIUM CHLORIDE 0.9 % IV SOLN
INTRAVENOUS | Status: DC
  Filled 2014-03-24: qty 1000

## 2014-03-24 MED ORDER — CEFAZOLIN SODIUM-DEXTROSE 2-3 GM-% IV SOLR
2.0000 g | INTRAVENOUS | Status: AC
  Administered 2014-03-25: 2 g via INTRAVENOUS
  Filled 2014-03-24 (×2): qty 50

## 2014-03-24 MED ORDER — ACETAMINOPHEN 10 MG/ML IV SOLN
1000.0000 mg | Freq: Once | INTRAVENOUS | Status: AC
  Administered 2014-03-25: 1000 mg via INTRAVENOUS
  Filled 2014-03-24: qty 100

## 2014-03-24 NOTE — Anesthesia Preprocedure Evaluation (Addendum)
Anesthesia Evaluation  Patient identified by MRN, date of birth, ID band Patient awake    Reviewed: Allergy & Precautions, NPO status , Patient's Chart, lab work & pertinent test results  Airway Mallampati: II   Neck ROM: Full    Dental  (+) Teeth Intact, Dental Advisory Given   Pulmonary shortness of breath, sleep apnea , former smoker (quit 1982),  breath sounds clear to auscultation        Cardiovascular hypertension, Pt. on medications     Neuro/Psych    GI/Hepatic negative GI ROS, Neg liver ROS,   Endo/Other  negative endocrine ROS  Renal/GU negative Renal ROS     Musculoskeletal   Abdominal (+)  Abdomen: soft.    Peds  Hematology 12/37   Anesthesia Other Findings   Reproductive/Obstetrics                           Anesthesia Physical Anesthesia Plan  ASA: III  Anesthesia Plan: General   Post-op Pain Management: MAC Combined w/ Regional for Post-op pain   Induction: Intravenous  Airway Management Planned: Oral ETT  Additional Equipment:   Intra-op Plan:   Post-operative Plan: Extubation in OR  Informed Consent: I have reviewed the patients History and Physical, chart, labs and discussed the procedure including the risks, benefits and alternatives for the proposed anesthesia with the patient or authorized representative who has indicated his/her understanding and acceptance.     Plan Discussed with:   Anesthesia Plan Comments:         Anesthesia Quick Evaluation

## 2014-03-25 ENCOUNTER — Inpatient Hospital Stay (HOSPITAL_COMMUNITY)
Admission: RE | Admit: 2014-03-25 | Discharge: 2014-03-27 | DRG: 470 | Disposition: A | Discharge status: Home Health | Payer: Medicare Other | Source: Ambulatory Visit | Attending: Orthopaedic Surgery | Admitting: Orthopaedic Surgery

## 2014-03-25 ENCOUNTER — Encounter (HOSPITAL_COMMUNITY): Payer: Self-pay | Admitting: *Deleted

## 2014-03-25 ENCOUNTER — Inpatient Hospital Stay (HOSPITAL_COMMUNITY): Payer: Medicare Other | Admitting: Anesthesiology

## 2014-03-25 ENCOUNTER — Encounter (HOSPITAL_COMMUNITY)
Admission: RE | Disposition: A | Discharge status: Home Health | Payer: Self-pay | Source: Ambulatory Visit | Attending: Orthopaedic Surgery

## 2014-03-25 DIAGNOSIS — M069 Rheumatoid arthritis, unspecified: Secondary | ICD-10-CM | POA: Diagnosis present

## 2014-03-25 DIAGNOSIS — Z87891 Personal history of nicotine dependence: Secondary | ICD-10-CM | POA: Diagnosis not present

## 2014-03-25 DIAGNOSIS — Z6841 Body Mass Index (BMI) 40.0 and over, adult: Secondary | ICD-10-CM

## 2014-03-25 DIAGNOSIS — E669 Obesity, unspecified: Secondary | ICD-10-CM | POA: Diagnosis present

## 2014-03-25 DIAGNOSIS — Z7901 Long term (current) use of anticoagulants: Secondary | ICD-10-CM | POA: Diagnosis not present

## 2014-03-25 DIAGNOSIS — Z79899 Other long term (current) drug therapy: Secondary | ICD-10-CM | POA: Diagnosis not present

## 2014-03-25 DIAGNOSIS — M1712 Unilateral primary osteoarthritis, left knee: Secondary | ICD-10-CM | POA: Diagnosis present

## 2014-03-25 DIAGNOSIS — G4730 Sleep apnea, unspecified: Secondary | ICD-10-CM | POA: Diagnosis present

## 2014-03-25 DIAGNOSIS — Z8249 Family history of ischemic heart disease and other diseases of the circulatory system: Secondary | ICD-10-CM | POA: Diagnosis not present

## 2014-03-25 DIAGNOSIS — Z823 Family history of stroke: Secondary | ICD-10-CM

## 2014-03-25 DIAGNOSIS — M179 Osteoarthritis of knee, unspecified: Secondary | ICD-10-CM | POA: Diagnosis not present

## 2014-03-25 DIAGNOSIS — Z96652 Presence of left artificial knee joint: Secondary | ICD-10-CM

## 2014-03-25 DIAGNOSIS — Z833 Family history of diabetes mellitus: Secondary | ICD-10-CM

## 2014-03-25 DIAGNOSIS — D62 Acute posthemorrhagic anemia: Secondary | ICD-10-CM | POA: Diagnosis not present

## 2014-03-25 DIAGNOSIS — I10 Essential (primary) hypertension: Secondary | ICD-10-CM | POA: Diagnosis not present

## 2014-03-25 DIAGNOSIS — M0609 Rheumatoid arthritis without rheumatoid factor, multiple sites: Secondary | ICD-10-CM | POA: Diagnosis present

## 2014-03-25 DIAGNOSIS — M25562 Pain in left knee: Secondary | ICD-10-CM | POA: Diagnosis present

## 2014-03-25 HISTORY — PX: TOTAL KNEE ARTHROPLASTY: SHX125

## 2014-03-25 SURGERY — ARTHROPLASTY, KNEE, TOTAL
Anesthesia: General | Site: Knee | Laterality: Left

## 2014-03-25 MED ORDER — LACTATED RINGERS IV SOLN
INTRAVENOUS | Status: DC
  Administered 2014-03-25 (×2): via INTRAVENOUS

## 2014-03-25 MED ORDER — FENTANYL CITRATE 0.05 MG/ML IJ SOLN
INTRAMUSCULAR | Status: AC
  Filled 2014-03-25: qty 5

## 2014-03-25 MED ORDER — SODIUM CHLORIDE 0.9 % IV SOLN
INTRAVENOUS | Status: DC
  Administered 2014-03-25: 20:00:00 via INTRAVENOUS

## 2014-03-25 MED ORDER — CEFAZOLIN SODIUM-DEXTROSE 2-3 GM-% IV SOLR
2.0000 g | Freq: Four times a day (QID) | INTRAVENOUS | Status: AC
  Administered 2014-03-25 (×2): 2 g via INTRAVENOUS
  Filled 2014-03-25 (×3): qty 50

## 2014-03-25 MED ORDER — METHOCARBAMOL 1000 MG/10ML IJ SOLN: 500.0000 mg | Freq: Four times a day (QID) | INTRAVENOUS | Status: DC | PRN

## 2014-03-25 MED ORDER — HYDROCHLOROTHIAZIDE 25 MG PO TABS
25.0000 mg | ORAL_TABLET | Freq: Every day | ORAL | Status: DC
  Administered 2014-03-25 – 2014-03-27 (×3): 25 mg via ORAL
  Filled 2014-03-25 (×3): qty 1

## 2014-03-25 MED ORDER — ADULT MULTIVITAMIN W/MINERALS CH
1.0000 | ORAL_TABLET | Freq: Every day | ORAL | Status: DC
  Administered 2014-03-25 – 2014-03-27 (×3): 1 via ORAL
  Filled 2014-03-25 (×3): qty 1

## 2014-03-25 MED ORDER — THROMBIN 20000 UNITS EX KIT: PACK | CUTANEOUS | Status: DC | PRN

## 2014-03-25 MED ORDER — METOCLOPRAMIDE HCL 10 MG PO TABS: 5.0000 mg | ORAL_TABLET | Freq: Three times a day (TID) | ORAL | Status: DC | PRN

## 2014-03-25 MED ORDER — BUPIVACAINE HCL (PF) 0.25 % IJ SOLN
INTRAMUSCULAR | Status: AC
  Filled 2014-03-25: qty 30

## 2014-03-25 MED ORDER — SODIUM CHLORIDE 0.9 % IR SOLN
Status: DC | PRN
  Administered 2014-03-25: 1000 mL

## 2014-03-25 MED ORDER — NEOSTIGMINE METHYLSULFATE 10 MG/10ML IV SOLN
INTRAVENOUS | Status: AC
  Filled 2014-03-25: qty 2

## 2014-03-25 MED ORDER — ACETAMINOPHEN 10 MG/ML IV SOLN
INTRAVENOUS | Status: AC
  Filled 2014-03-25: qty 100

## 2014-03-25 MED ORDER — GLYCOPYRROLATE 0.2 MG/ML IJ SOLN
INTRAMUSCULAR | Status: AC
  Filled 2014-03-25: qty 2

## 2014-03-25 MED ORDER — ACETAMINOPHEN 10 MG/ML IV SOLN
1000.0000 mg | Freq: Four times a day (QID) | INTRAVENOUS | Status: AC
  Administered 2014-03-25 – 2014-03-26 (×4): 1000 mg via INTRAVENOUS
  Filled 2014-03-25 (×3): qty 100

## 2014-03-25 MED ORDER — ONDANSETRON HCL 4 MG/2ML IJ SOLN
INTRAMUSCULAR | Status: DC | PRN
  Administered 2014-03-25: 4 mg via INTRAVENOUS

## 2014-03-25 MED ORDER — CHLORHEXIDINE GLUCONATE 4 % EX LIQD
60.0000 mL | Freq: Once | CUTANEOUS | Status: DC
  Filled 2014-03-25: qty 60

## 2014-03-25 MED ORDER — HYDROMORPHONE HCL 1 MG/ML IJ SOLN
0.5000 mg | INTRAMUSCULAR | Status: DC | PRN
  Administered 2014-03-26: 1 mg via INTRAVENOUS
  Filled 2014-03-25: qty 1

## 2014-03-25 MED ORDER — DIPHENHYDRAMINE HCL 12.5 MG/5ML PO ELIX: 12.5000 mg | ORAL_SOLUTION | ORAL | Status: DC | PRN

## 2014-03-25 MED ORDER — MEPERIDINE HCL 25 MG/ML IJ SOLN: 6.2500 mg | INTRAMUSCULAR | Status: DC | PRN

## 2014-03-25 MED ORDER — FENTANYL CITRATE 0.05 MG/ML IJ SOLN
INTRAMUSCULAR | Status: AC
  Filled 2014-03-25: qty 2

## 2014-03-25 MED ORDER — LIDOCAINE HCL (CARDIAC) 20 MG/ML IV SOLN
INTRAVENOUS | Status: DC | PRN
  Administered 2014-03-25: 100 mg via INTRAVENOUS

## 2014-03-25 MED ORDER — PROPOFOL 10 MG/ML IV BOLUS
INTRAVENOUS | Status: DC | PRN
  Administered 2014-03-25: 170 mg via INTRAVENOUS

## 2014-03-25 MED ORDER — ONDANSETRON HCL 4 MG/2ML IJ SOLN: 4.0000 mg | Freq: Four times a day (QID) | INTRAMUSCULAR | Status: DC | PRN

## 2014-03-25 MED ORDER — KETOROLAC TROMETHAMINE 15 MG/ML IJ SOLN
7.5000 mg | Freq: Four times a day (QID) | INTRAMUSCULAR | Status: AC
Start: 2014-03-25 — End: 2014-03-26
  Administered 2014-03-25 – 2014-03-26 (×4): 7.5 mg via INTRAVENOUS
  Filled 2014-03-25 (×4): qty 1

## 2014-03-25 MED ORDER — PHENOL 1.4 % MT LIQD: 1.0000 | OROMUCOSAL | Status: DC | PRN

## 2014-03-25 MED ORDER — RAMIPRIL 2.5 MG PO CAPS
2.5000 mg | ORAL_CAPSULE | Freq: Every day | ORAL | Status: DC
  Administered 2014-03-25 – 2014-03-27 (×3): 2.5 mg via ORAL
  Filled 2014-03-25 (×3): qty 1

## 2014-03-25 MED ORDER — THROMBIN 20000 UNITS EX KIT
PACK | CUTANEOUS | Status: AC
  Filled 2014-03-25: qty 1

## 2014-03-25 MED ORDER — PROPOFOL 10 MG/ML IV BOLUS
INTRAVENOUS | Status: AC
  Filled 2014-03-25: qty 20

## 2014-03-25 MED ORDER — BISACODYL 10 MG RE SUPP: 10.0000 mg | Freq: Every day | RECTAL | Status: DC | PRN

## 2014-03-25 MED ORDER — NEOSTIGMINE METHYLSULFATE 10 MG/10ML IV SOLN
INTRAVENOUS | Status: DC | PRN
  Administered 2014-03-25: 3 mg via INTRAVENOUS

## 2014-03-25 MED ORDER — PROMETHAZINE HCL 25 MG/ML IJ SOLN: 6.2500 mg | INTRAMUSCULAR | Status: DC | PRN

## 2014-03-25 MED ORDER — DOCUSATE SODIUM 100 MG PO CAPS
100.0000 mg | ORAL_CAPSULE | Freq: Two times a day (BID) | ORAL | Status: DC
  Administered 2014-03-25 – 2014-03-27 (×4): 100 mg via ORAL
  Filled 2014-03-25 (×4): qty 1

## 2014-03-25 MED ORDER — MENTHOL 3 MG MT LOZG: 1.0000 | LOZENGE | OROMUCOSAL | Status: DC | PRN

## 2014-03-25 MED ORDER — FOLIC ACID 1 MG PO TABS
2.0000 mg | ORAL_TABLET | Freq: Every day | ORAL | Status: DC
  Administered 2014-03-25 – 2014-03-27 (×3): 2 mg via ORAL
  Filled 2014-03-25 (×3): qty 2

## 2014-03-25 MED ORDER — METHOCARBAMOL 500 MG PO TABS
ORAL_TABLET | ORAL | Status: AC
  Filled 2014-03-25: qty 1

## 2014-03-25 MED ORDER — FENTANYL CITRATE 0.05 MG/ML IJ SOLN
INTRAMUSCULAR | Status: DC | PRN
  Administered 2014-03-25: 50 ug via INTRAVENOUS
  Administered 2014-03-25: 100 ug via INTRAVENOUS
  Administered 2014-03-25 (×2): 50 ug via INTRAVENOUS

## 2014-03-25 MED ORDER — FERROUS SULFATE 325 (65 FE) MG PO TABS
325.0000 mg | ORAL_TABLET | Freq: Every day | ORAL | Status: DC
  Administered 2014-03-26 – 2014-03-27 (×2): 325 mg via ORAL
  Filled 2014-03-25 (×2): qty 1

## 2014-03-25 MED ORDER — ONDANSETRON HCL 4 MG/2ML IJ SOLN
INTRAMUSCULAR | Status: AC
  Filled 2014-03-25: qty 2

## 2014-03-25 MED ORDER — POLYETHYLENE GLYCOL 3350 17 G PO PACK: 17.0000 g | PACK | Freq: Every day | ORAL | Status: DC | PRN

## 2014-03-25 MED ORDER — FENTANYL CITRATE 0.05 MG/ML IJ SOLN
INTRAMUSCULAR | Status: AC
  Administered 2014-03-25: 50 ug via INTRAVENOUS
  Filled 2014-03-25: qty 2

## 2014-03-25 MED ORDER — MIDAZOLAM HCL 2 MG/2ML IJ SOLN
INTRAMUSCULAR | Status: AC
  Administered 2014-03-25: 1 mg via INTRAVENOUS
  Filled 2014-03-25: qty 2

## 2014-03-25 MED ORDER — OXYCODONE HCL 5 MG PO TABS
5.0000 mg | ORAL_TABLET | ORAL | Status: DC | PRN
  Administered 2014-03-25 (×2): 5 mg via ORAL
  Administered 2014-03-26 – 2014-03-27 (×6): 10 mg via ORAL
  Filled 2014-03-25 (×2): qty 2
  Filled 2014-03-25: qty 1
  Filled 2014-03-25 (×4): qty 2

## 2014-03-25 MED ORDER — DEXAMETHASONE SODIUM PHOSPHATE 4 MG/ML IJ SOLN
INTRAMUSCULAR | Status: DC | PRN
  Administered 2014-03-25: 4 mg via INTRAVENOUS

## 2014-03-25 MED ORDER — ALUM & MAG HYDROXIDE-SIMETH 200-200-20 MG/5ML PO SUSP: 30.0000 mL | ORAL | Status: DC | PRN

## 2014-03-25 MED ORDER — RIVAROXABAN 10 MG PO TABS
10.0000 mg | ORAL_TABLET | Freq: Every day | ORAL | Status: DC
  Administered 2014-03-26 – 2014-03-27 (×2): 10 mg via ORAL
  Filled 2014-03-25 (×2): qty 1

## 2014-03-25 MED ORDER — FERROUS SULFATE 325 (65 FE) MG PO TBEC: 325.0000 mg | DELAYED_RELEASE_TABLET | Freq: Every day | ORAL | Status: DC

## 2014-03-25 MED ORDER — FENTANYL CITRATE 0.05 MG/ML IJ SOLN
25.0000 ug | INTRAMUSCULAR | Status: DC | PRN
  Administered 2014-03-25 (×3): 25 ug via INTRAVENOUS
  Administered 2014-03-25: 50 ug via INTRAVENOUS
  Administered 2014-03-25: 25 ug via INTRAVENOUS

## 2014-03-25 MED ORDER — METOCLOPRAMIDE HCL 5 MG/ML IJ SOLN: 5.0000 mg | Freq: Three times a day (TID) | INTRAMUSCULAR | Status: DC | PRN

## 2014-03-25 MED ORDER — BUPIVACAINE-EPINEPHRINE (PF) 0.25% -1:200000 IJ SOLN
INTRAMUSCULAR | Status: AC
  Filled 2014-03-25: qty 30

## 2014-03-25 MED ORDER — DEXAMETHASONE SODIUM PHOSPHATE 4 MG/ML IJ SOLN
INTRAMUSCULAR | Status: AC
  Filled 2014-03-25: qty 1

## 2014-03-25 MED ORDER — ROCURONIUM BROMIDE 100 MG/10ML IV SOLN
INTRAVENOUS | Status: DC | PRN
  Administered 2014-03-25: 50 mg via INTRAVENOUS

## 2014-03-25 MED ORDER — METHOCARBAMOL 500 MG PO TABS
500.0000 mg | ORAL_TABLET | Freq: Four times a day (QID) | ORAL | Status: DC | PRN
  Administered 2014-03-25 – 2014-03-27 (×4): 500 mg via ORAL
  Filled 2014-03-25 (×3): qty 1

## 2014-03-25 MED ORDER — GLYCOPYRROLATE 0.2 MG/ML IJ SOLN
INTRAMUSCULAR | Status: DC | PRN
  Administered 2014-03-25: .4 mg via INTRAVENOUS

## 2014-03-25 MED ORDER — FLEET ENEMA 7-19 GM/118ML RE ENEM: 1.0000 | ENEMA | Freq: Once | RECTAL | Status: AC | PRN

## 2014-03-25 MED ORDER — BUPIVACAINE-EPINEPHRINE (PF) 0.5% -1:200000 IJ SOLN
INTRAMUSCULAR | Status: DC | PRN
  Administered 2014-03-25: 30 mL via PERINEURAL

## 2014-03-25 MED ORDER — OXYCODONE HCL 5 MG PO TABS
ORAL_TABLET | ORAL | Status: AC
  Filled 2014-03-25: qty 1

## 2014-03-25 MED ORDER — ONDANSETRON HCL 4 MG PO TABS: 4.0000 mg | ORAL_TABLET | Freq: Four times a day (QID) | ORAL | Status: DC | PRN

## 2014-03-25 MED ORDER — BUPIVACAINE-EPINEPHRINE (PF) 0.25% -1:200000 IJ SOLN
INTRAMUSCULAR | Status: DC | PRN
  Administered 2014-03-25: 30 mL

## 2014-03-25 SURGICAL SUPPLY — 65 items
BANDAGE ESMARK 6X9 LF (GAUZE/BANDAGES/DRESSINGS) ×1 IMPLANT
BLADE SAGITTAL 25.0X1.19X90 (BLADE) ×2 IMPLANT
BLADE SAGITTAL 25.0X1.19X90MM (BLADE) ×1
BNDG CMPR 9X6 STRL LF SNTH (GAUZE/BANDAGES/DRESSINGS) ×1
BNDG ESMARK 6X9 LF (GAUZE/BANDAGES/DRESSINGS) ×3
BOWL SMART MIX CTS (DISPOSABLE) ×3 IMPLANT
CAP KNEE TOTAL 3 SIGMA ×2 IMPLANT
CEMENT HV SMART SET (Cement) ×6 IMPLANT
COVER SURGICAL LIGHT HANDLE (MISCELLANEOUS) ×3 IMPLANT
CUFF TOURNIQUET SINGLE 34IN LL (TOURNIQUET CUFF) ×2 IMPLANT
CUFF TOURNIQUET SINGLE 44IN (TOURNIQUET CUFF) IMPLANT
DRAPE EXTREMITY T 121X128X90 (DRAPE) ×3 IMPLANT
DRAPE IMP U-DRAPE 54X76 (DRAPES) ×3 IMPLANT
DRAPE INCISE IOBAN 66X45 STRL (DRAPES) ×2 IMPLANT
DRAPE PROXIMA HALF (DRAPES) ×3 IMPLANT
DRSG ADAPTIC 3X8 NADH LF (GAUZE/BANDAGES/DRESSINGS) ×3 IMPLANT
DRSG PAD ABDOMINAL 8X10 ST (GAUZE/BANDAGES/DRESSINGS) ×4 IMPLANT
DURAPREP 26ML APPLICATOR (WOUND CARE) ×6 IMPLANT
ELECT CAUTERY BLADE 6.4 (BLADE) ×3 IMPLANT
ELECT REM PT RETURN 9FT ADLT (ELECTROSURGICAL) ×3
ELECTRODE REM PT RTRN 9FT ADLT (ELECTROSURGICAL) ×1 IMPLANT
EVACUATOR 1/8 PVC DRAIN (DRAIN) IMPLANT
FACESHIELD WRAPAROUND (MASK) ×6 IMPLANT
FACESHIELD WRAPAROUND OR TEAM (MASK) ×2 IMPLANT
FLOSEAL 10ML (HEMOSTASIS) IMPLANT
GAUZE SPONGE 4X4 12PLY STRL (GAUZE/BANDAGES/DRESSINGS) ×3 IMPLANT
GLOVE BIOGEL PI IND STRL 8 (GLOVE) ×1 IMPLANT
GLOVE BIOGEL PI IND STRL 8.5 (GLOVE) ×1 IMPLANT
GLOVE BIOGEL PI INDICATOR 8 (GLOVE) ×2
GLOVE BIOGEL PI INDICATOR 8.5 (GLOVE) ×2
GLOVE ECLIPSE 8.0 STRL XLNG CF (GLOVE) ×6 IMPLANT
GLOVE SURG ORTHO 8.5 STRL (GLOVE) ×6 IMPLANT
GOWN STRL REUS W/ TWL LRG LVL3 (GOWN DISPOSABLE) ×2 IMPLANT
GOWN STRL REUS W/TWL 2XL LVL3 (GOWN DISPOSABLE) ×3 IMPLANT
GOWN STRL REUS W/TWL LRG LVL3 (GOWN DISPOSABLE) ×6
HANDPIECE INTERPULSE COAX TIP (DISPOSABLE) ×3
KIT BASIN OR (CUSTOM PROCEDURE TRAY) ×3 IMPLANT
KIT ROOM TURNOVER OR (KITS) ×3 IMPLANT
MANIFOLD NEPTUNE II (INSTRUMENTS) ×3 IMPLANT
NEEDLE 22X1 1/2 (OR ONLY) (NEEDLE) IMPLANT
NS IRRIG 1000ML POUR BTL (IV SOLUTION) ×3 IMPLANT
PACK TOTAL JOINT (CUSTOM PROCEDURE TRAY) ×3 IMPLANT
PACK UNIVERSAL I (CUSTOM PROCEDURE TRAY) ×3 IMPLANT
PAD ARMBOARD 7.5X6 YLW CONV (MISCELLANEOUS) ×6 IMPLANT
PAD CAST 4YDX4 CTTN HI CHSV (CAST SUPPLIES) ×1 IMPLANT
PADDING CAST COTTON 4X4 STRL (CAST SUPPLIES) ×3
PADDING CAST COTTON 6X4 STRL (CAST SUPPLIES) ×3 IMPLANT
SET HNDPC FAN SPRY TIP SCT (DISPOSABLE) ×1 IMPLANT
SPONGE GAUZE 4X4 12PLY STER LF (GAUZE/BANDAGES/DRESSINGS) ×2 IMPLANT
STAPLER VISISTAT 35W (STAPLE) ×3 IMPLANT
SUCTION FRAZIER TIP 10 FR DISP (SUCTIONS) ×3 IMPLANT
SUT BONE WAX W31G (SUTURE) ×3 IMPLANT
SUT ETHIBOND NAB CT1 #1 30IN (SUTURE) ×9 IMPLANT
SUT MNCRL AB 3-0 PS2 18 (SUTURE) ×3 IMPLANT
SUT VIC AB 0 CT1 27 (SUTURE) ×3
SUT VIC AB 0 CT1 27XBRD ANBCTR (SUTURE) ×1 IMPLANT
SUT VIC AB 1 CT1 27 (SUTURE) ×3
SUT VIC AB 1 CT1 27XBRD ANBCTR (SUTURE) ×1 IMPLANT
SYR CONTROL 10ML LL (SYRINGE) IMPLANT
TOWEL OR 17X24 6PK STRL BLUE (TOWEL DISPOSABLE) ×3 IMPLANT
TOWEL OR 17X26 10 PK STRL BLUE (TOWEL DISPOSABLE) ×3 IMPLANT
TRAY FOLEY CATH 16FRSI W/METER (SET/KITS/TRAYS/PACK) IMPLANT
UPCHARGE REV TRAY MBT KNEE ×2 IMPLANT
WATER STERILE IRR 1000ML POUR (IV SOLUTION) ×6 IMPLANT
WRAP KNEE MAXI GEL POST OP (GAUZE/BANDAGES/DRESSINGS) ×3 IMPLANT

## 2014-03-25 NOTE — Progress Notes (Signed)
Placed patient on CPAP for the night via auto-mode with minimum pressure set at 5cm and maximum pressure set at 20cm. Oxygen set at 2lpm at this time

## 2014-03-25 NOTE — H&P (Signed)
  The recent History & Physical has been reviewed. I have personally examined the patient today. There is no interval change to the documented History & Physical. The patient would like to proceed with the procedure.  [name] W 03/25/2014,  12:10 PM

## 2014-03-25 NOTE — Op Note (Signed)
PATIENT ID:      JORDAIN RADIN  MRN:     [id number] DOB/AGE:    1945-06-29 / 69 y.o.       OPERATIVE REPORT    DATE OF PROCEDURE:  03/25/2014       PREOPERATIVE DIAGNOSIS:   LEFT KNEE END STAGE OSTEOARTHRITIS/RHEUMATOID ARTHRITIS                                                       Estimated body mass index is 40.27 kg/(m^2) as calculated from the following:   Height as of 03/17/14: 5\' 1"  (1.549 m).   Weight as of this encounter: 96.616 kg (213 lb).     POSTOPERATIVE DIAGNOSIS:   LEFT KNEE END STAGE OSTEOARTHRITIS-SAME                                                                     Estimated body mass index is 40.27 kg/(m^2) as calculated from the following:   Height as of 03/17/14: 5\' 1"  (1.549 m).   Weight as of this encounter: 96.616 kg (213 lb).     PROCEDURE:  Procedure(s):LEFT TOTAL KNEE ARTHROPLASTY      SURGEON:  [name], MD    ASSISTANT:   Biagio Borg, PA-C   (Present and scrubbed throughout the case, critical for assistance with exposure, retraction, instrumentation, and closure.)          ANESTHESIA: regional and general     DRAINS: HEMOVAC DRAIN IN LEFT KNEE-CLAMPED     TOURNIQUET TIME:  Total Tourniquet Time Documented: Thigh (Left) - 92 minutes Total: Thigh (Left) - 92 minutes     COMPLICATIONS:  None   CONDITION:  stable  PROCEDURE IN DETAIL: 100120   Marianne Golightly W 03/25/2014, 2:28 PM

## 2014-03-25 NOTE — Progress Notes (Signed)
Care of pt assumed by MA Zaydah Nawabi RN 

## 2014-03-25 NOTE — Progress Notes (Signed)
Orthopedic Tech Progress Note Patient Details:  [name] 09-06-1945 [id number]  CPM Left Knee CPM Left Knee: On Left Knee Flexion (Degrees): 90 Left Knee Extension (Degrees): 0   Braulio Bosch 03/25/2014, 3:57 PM

## 2014-03-25 NOTE — Anesthesia Postprocedure Evaluation (Signed)
  Anesthesia Post-op Note  Patient: [name]  Procedure(s) Performed: Procedure(s): TOTAL KNEE ARTHROPLASTY (Left)  Patient Location: PACU  Anesthesia Type:General  Level of Consciousness: awake  Airway and Oxygen Therapy: Patient Spontanous Breathing and Patient connected to nasal cannula oxygen  Post-op Pain: none  Post-op Assessment: Post-op Vital signs reviewed  Post-op Vital Signs: Reviewed and stable  Last Vitals:  Filed Vitals:   03/25/14 1503  BP: 134/62  Pulse: 79  Temp: 36.8 C  Resp: 12    Complications: No apparent anesthesia complications

## 2014-03-25 NOTE — Anesthesia Procedure Notes (Signed)
Anesthesia Regional Block:  Femoral nerve block  Pre-Anesthetic Checklist: ,, timeout performed, Correct Patient, Correct Site, Correct Laterality, Correct Procedure, Correct Position, site marked, Risks and benefits discussed,  Surgical consent,  Pre-op evaluation,  At surgeon's request and post-op pain management  Laterality: Left and Lower  Prep: Maximum Sterile Barrier Precautions used and chloraprep       Needles:  Injection technique: Single-shot  Needle Type: Echogenic Stimulator Needle     Needle Length: 10cm 10 cm Needle Gauge: 21 and 21 G    Additional Needles:  Procedures: ultrasound guided (picture in chart) and nerve stimulator Femoral nerve block  Nerve Stimulator or Paresthesia:  Response: 0.4 mA,   Additional Responses:   Narrative:  Start time: 03/25/2014 11:54 AM End time: 03/25/2014 12:00 PM Injection made incrementally with aspirations every 5 mL.  Performed by: Personally  Anesthesiologist: Alexis Frock  Additional Notes: L femoral nerve block with 8ml of .5% marcaine with epi, multiple asp, talked to patient throughout, no complications [place] and Stim

## 2014-03-25 NOTE — Progress Notes (Signed)
Utilization review completed.  

## 2014-03-25 NOTE — Transfer of Care (Signed)
Immediate Anesthesia Transfer of Care Note  Patient: [name]  Procedure(s) Performed: Procedure(s): TOTAL KNEE ARTHROPLASTY (Left)  Patient Location: PACU  Anesthesia Type:General  Level of Consciousness: awake, alert  and oriented  Airway & Oxygen Therapy: Patient Spontanous Breathing and Patient connected to nasal cannula oxygen  Post-op Assessment: Report given to RN and Post -op Vital signs reviewed and stable  Post vital signs: Reviewed and stable  Last Vitals:  Filed Vitals:   03/25/14 1200  BP: 156/74  Pulse: 79  Temp:   Resp: 15    Complications: No apparent anesthesia complications

## 2014-03-26 ENCOUNTER — Encounter (HOSPITAL_COMMUNITY): Payer: Self-pay | Admitting: Orthopaedic Surgery

## 2014-03-26 LAB — BASIC METABOLIC PANEL
Anion gap: 10 (ref 5–15)
BUN: 14 mg/dL (ref 6–23)
CO2: 24 mmol/L (ref 19–32)
Calcium: 8.5 mg/dL (ref 8.4–10.5)
Chloride: 104 mmol/L (ref 96–112)
Creatinine, Ser: 1.28 mg/dL — ABNORMAL HIGH (ref 0.50–1.10)
GFR calc Af Amer: 49 mL/min — ABNORMAL LOW (ref >90)
GFR calc non Af Amer: 42 mL/min — ABNORMAL LOW (ref >90)
Glucose, Bld: 106 mg/dL — ABNORMAL HIGH (ref 70–99)
Potassium: 4.2 mmol/L (ref 3.5–5.1)
Sodium: 138 mmol/L (ref 135–145)

## 2014-03-26 LAB — CBC
HCT: 30 % — ABNORMAL LOW (ref 36.0–46.0)
Hemoglobin: 9.5 g/dL — ABNORMAL LOW (ref 12.0–15.0)
MCH: 28.9 pg (ref 26.0–34.0)
MCHC: 31.7 g/dL (ref 30.0–36.0)
MCV: 91.2 fL (ref 78.0–100.0)
Platelets: 215 10*3/uL (ref 150–400)
RBC: 3.29 MIL/uL — ABNORMAL LOW (ref 3.87–5.11)
RDW: 13.4 % (ref 11.5–15.5)
WBC: 11.2 10*3/uL — ABNORMAL HIGH (ref 4.0–10.5)

## 2014-03-26 NOTE — Progress Notes (Signed)
Patient ID: [name], female   DOB: 1945/05/19, 69 y.o.   MRN: [id number] PATIENT ID: [name]        MRN:  [id number]          DOB/AGE: March 25, 1945 / 69 y.o.    Brittney Fears, MD   Brittney Borg, PA-C [address] [place], [place]  [address]                             [phone]   PROGRESS NOTE  Subjective:  negative for Chest Pain  negative for Shortness of Breath  negative for Nausea/Vomiting   negative for Calf Pain    Tolerating Diet: yes         Patient reports pain as mild.     Comfortable, sitting up eating breakfast  Objective: Vital signs in last 24 hours:   Patient Vitals for the past 24 hrs:  BP Temp Temp src Pulse Resp SpO2 Weight  03/26/14 0552 118/68 mmHg 98.9 F (37.2 C) - 78 17 99 % -  03/26/14 0357 - - - - 17 99 % -  03/26/14 0036 (!) 100/54 mmHg 99 F (37.2 C) - 83 17 99 % -  03/25/14 2357 - - - - 17 99 % -  03/25/14 2329 - - - 71 17 99 % -  03/25/14 2000 - - - - 17 99 % -  03/25/14 1930 123/62 mmHg 98.8 F (37.1 C) - 66 16 100 % -  03/25/14 1752 (!) 147/77 mmHg 97.8 F (36.6 C) - 61 16 100 % -  03/25/14 1722 140/70 mmHg - - 61 13 100 % -  03/25/14 1721 - 98.2 F (36.8 C) - 68 15 100 % -  03/25/14 1715 - - - 65 13 100 % -  03/25/14 1703 (!) 141/72 mmHg - - - - - -  03/25/14 1700 - - - 67 10 100 % -  03/25/14 1648 139/74 mmHg - - - - - -  03/25/14 1645 - - - 73 16 100 % -  03/25/14 1633 (!) 142/68 mmHg - - - - - -  03/25/14 1628 - - - 66 10 100 % -  03/25/14 1619 133/70 mmHg - - - - - -  03/25/14 1614 - - - 66 10 100 % -  03/25/14 1603 130/69 mmHg - - - - - -  03/25/14 1600 - - - 63 15 100 % -  03/25/14 1548 (!) 141/78 mmHg - - - - - -  03/25/14 1545 - - - 63 14 100 % -  03/25/14 1533 139/64 mmHg - - - - - -  03/25/14 1530 - - - 69 16 100 % -  03/25/14 1520 131/67 mmHg - - - - - -  03/25/14 1503 134/62 mmHg 98.3 F (36.8 C) - 79 12 100 % -  03/25/14 1200 (!) 156/74 mmHg - - 79 15 100 % -  03/25/14 1155 (!) 144/71  mmHg - - 75 16 100 % -  03/25/14 1150 133/67 mmHg - - 74 10 98 % -  03/25/14 1145 (!) 156/73 mmHg - - 74 13 99 % -  03/25/14 1140 (!) 149/73 mmHg - - 73 13 99 % -  03/25/14 1135 (!) 153/89 mmHg - - 73 18 100 % -  03/25/14 1130 - - - 71 (!) 42 100 % -  03/25/14 1125 - - - 73 17 100 % -  03/25/14 1006 (!) 147/76 mmHg 98.1 F (36.7 C) Oral 70 20 100 % 96.616 kg (213 lb)      Intake/Output from previous day:   03/17 0701 - 03/18 0700 In: 2030 [P.O.:180; I.V.:1850] Out: 250 [Drains:250]   Intake/Output this shift:       Intake/Output      03/17 0701 - 03/18 0700 03/18 0701 - 03/19 0700   P.O. 180    I.V. (mL/kg) 1850 (19.1)    Total Intake(mL/kg) 2030 (21)    Urine (mL/kg/hr) 0    Drains 250    Total Output 250     Net +1780          Urine Occurrence 2 x       LABORATORY DATA:  Recent Labs  03/26/14 0450  WBC 11.2*  HGB 9.5*  HCT 30.0*  PLT 215    Recent Labs  03/26/14 0450  NA 138  K 4.2  CL 104  CO2 24  BUN 14  CREATININE 1.28*  GLUCOSE 106*  CALCIUM 8.5   Lab Results  Component Value Date   INR 1.05 03/17/2014   INR 1.02 01/15/2010    Recent Radiographic Studies :  Dg Chest 2 View  03/17/2014   CLINICAL DATA:  Preop for left total knee replacement, former smoking history  EXAM: CHEST  2 VIEW  COMPARISON:  Chest x-ray of 09/13/2012  FINDINGS: No active infiltrate or effusion is seen. Mediastinal and hilar contours are unremarkable. The heart is borderline enlarged. There are degenerative changes throughout the thoracic spine and within both shoulders.  IMPRESSION: No active cardiopulmonary disease.   Electronically Signed   By: Ivar Drape M.D.   On: 03/17/2014 15:38     Examination:  General appearance: alert, cooperative and no distress  Wound Exam: clean, dry, intact   Drainage:  Scant/small amount Serosanguinous exudate in hemovac  Motor Exam: EHL, FHL, Anterior Tibial and Posterior Tibial Intact  Sensory Exam: Superficial Peroneal, Deep  Peroneal and Tibial normal  Vascular Exam: Normal  Assessment:    1 Day Post-Op  Procedure(s) (LRB): TOTAL KNEE ARTHROPLASTY (Left)  ADDITIONAL DIAGNOSIS:  Principal Problem:   Osteoarthritis of left knee Active Problems:   Rheumatoid arthritis of knee   S/P total knee replacement using cement  Acute Blood Loss Anemia-monitor   Plan: Physical Therapy as ordered Partial Weight Bearing @ 50% (PWB)  DVT Prophylaxis:  Xarelto, Foot Pumps and TED hose  DISCHARGE PLAN: Home  DISCHARGE NEEDS: HHPT, CPM, Walker and 3-in-1 comode seat   voiding, eating without difficulty, minimal hemovac drainage-removed, OOB with PT-expect discharge to home in am, [name], [name]  03/26/2014 7:52 AM

## 2014-03-26 NOTE — Progress Notes (Signed)
Placed patient on CPAP for the night.  Patient is tolerating well at this time. 

## 2014-03-26 NOTE — Evaluation (Signed)
Physical Therapy Evaluation Patient Details Name: [name] MRN: [id number] DOB: 02/23/1945 Today's Date: 03/26/2014   History of Present Illness  Pt is a 69 y.o. female s/p Lt TKA.   Clinical Impression  Pt is s/p Lt TKA POD#1 resulting in the deficits listed below (see PT Problem List).  Pt will benefit from skilled PT to increase their independence and safety with mobility to allow discharge to the venue listed below.     Follow Up Recommendations Home health PT;Supervision/Assistance - 24 hour    Equipment Recommendations  None recommended by PT    Recommendations for Other Services OT consult     Precautions / Restrictions Precautions Precautions: Knee Precaution Comments: educated on no pillow under knee precaution Restrictions Weight Bearing Restrictions: Yes LLE Weight Bearing: Partial weight bearing LLE Partial Weight Bearing Percentage or Pounds: 50      Mobility  Bed Mobility               General bed mobility comments: pt sitting EOB  Transfers Overall transfer level: Needs assistance Equipment used: Rolling walker (2 wheeled) Transfers: Sit to/from Stand Sit to Stand: Min guard         General transfer comment: cues for technique and hand placement  Ambulation/Gait Ambulation/Gait assistance: Min guard Ambulation Distance (Feet): 50 Feet Assistive device: Rolling walker (2 wheeled) Gait Pattern/deviations: Step-to pattern;Decreased stance time - left;Decreased step length - right;Antalgic;Wide base of support Gait velocity: decr Gait velocity interpretation: Below normal speed for age/gender General Gait Details: cues for step through technique while maintaining PWB status; pt fatigued quickly; requiring multiple standing rest breaks  Stairs            Wheelchair Mobility    Modified Rankin (Stroke Patients Only)       Balance Overall balance assessment: Needs assistance Sitting-balance support: Feet supported;No upper  extremity supported Sitting balance-Leahy Scale: Good Sitting balance - Comments: pt finishing up her bathing on EOB  independently    Standing balance support: Bilateral upper extremity supported;During functional activity Standing balance-Leahy Scale: Poor Standing balance comment: RW to balance                              Pertinent Vitals/Pain Pain Assessment: 0-10 Pain Score: 2  Pain Location: Lt knee with WB Pain Descriptors / Indicators: Sore Pain Intervention(s): Monitored during session;Premedicated before session;Repositioned    Home Living Family/patient expects to be discharged to:: Private residence Living Arrangements: Spouse/significant other Available Help at Discharge: Family;Available 24 hours/day Type of Home: House Home Access: Stairs to enter Entrance Stairs-Rails: None Entrance Stairs-Number of Steps: 2 Home Layout: One level Home Equipment: Walker - 2 wheels;Bedside commode Additional Comments: pt has tub shower    Prior Function Level of Independence: Independent with assistive device(s)         Comments: cane for long distances     Hand Dominance        Extremity/Trunk Assessment   Upper Extremity Assessment: Defer to OT evaluation           Lower Extremity Assessment: LLE deficits/detail   LLE Deficits / Details: AROM in sitting 5 to 70 degrees   Cervical / Trunk Assessment: Normal  Communication   Communication: No difficulties  Cognition Arousal/Alertness: Awake/alert Behavior During Therapy: WFL for tasks assessed/performed Overall Cognitive Status: Within Functional Limits for tasks assessed  General Comments      Exercises Total Joint Exercises Ankle Circles/Pumps: AROM;Both;10 reps;Seated Quad Sets: AROM;Left;10 reps;Seated Heel Slides: AAROM;Left;10 reps;Seated Hip ABduction/ADduction: AAROM;Left;10 reps;Seated Long Arc Quad: AROM;Left;10 reps      Assessment/Plan     PT Assessment Patient needs continued PT services  PT Diagnosis Difficulty walking;Generalized weakness;Acute pain   PT Problem List Decreased strength;Decreased range of motion;Decreased activity tolerance;Decreased balance;Decreased mobility;Decreased knowledge of use of DME;Decreased safety awareness;Decreased knowledge of precautions;Pain  PT Treatment Interventions DME instruction;Gait training;Stair training;Functional mobility training;Therapeutic activities;Therapeutic exercise;Balance training;Neuromuscular re-education;Patient/family education   PT Goals (Current goals can be found in the Care Plan section) Acute Rehab PT Goals Patient Stated Goal: go home tomorrow PT Goal Formulation: With patient Time For Goal Achievement: 04/02/14 Potential to Achieve Goals: Good    Frequency 7X/week   Barriers to discharge        Co-evaluation               End of Session Equipment Utilized During Treatment: Gait belt Activity Tolerance: Patient tolerated treatment well Patient left: in chair;with call bell/phone within reach Nurse Communication: Mobility status         Time: 5625-6389 PT Time Calculation (min) (ACUTE ONLY): 24 min   Charges:   PT Evaluation $Initial PT Evaluation Tier I: 1 Procedure PT Treatments $Gait Training: 8-22 mins   PT G CodesGustavus Bryant, [place]  [phone] 03/26/2014, 11:37 AM

## 2014-03-26 NOTE — Progress Notes (Signed)
Physical Therapy Treatment Patient Details Name: [name] MRN: [id number] DOB: 02/28/1945 Today's Date: 03/26/2014    History of Present Illness Pt is a 69 y.o. female s/p Lt TKA.     PT Comments    Pt mobilizing at min guard with intermittent buckling of Lt knee. Anticipate pt to be ready for D/C tomorrow after stair training. Will cont to follow per POC.   Follow Up Recommendations  Home health PT;Supervision/Assistance - 24 hour     Equipment Recommendations  None recommended by PT    Recommendations for Other Services OT consult     Precautions / Restrictions Precautions Precautions: Knee Precaution Comments: reinforced education on precautions Restrictions Weight Bearing Restrictions: Yes LLE Weight Bearing: Partial weight bearing LLE Partial Weight Bearing Percentage or Pounds: 50    Mobility  Bed Mobility Overal bed mobility: Needs Assistance Bed Mobility: Sit to Supine       Sit to supine: Supervision   General bed mobility comments: cues for hooklying technique to self (A) Lt LE into bed  Transfers Overall transfer level: Needs assistance Equipment used: Rolling walker (2 wheeled) Transfers: Sit to/from Stand Sit to Stand: Supervision         General transfer comment: cues for hand placement and safety   Ambulation/Gait Ambulation/Gait assistance: Min guard Ambulation Distance (Feet): 75 Feet Assistive device: Rolling walker (2 wheeled) Gait Pattern/deviations: Step-to pattern;Decreased stance time - left;Decreased step length - right;Antalgic Gait velocity: decr Gait velocity interpretation: Below normal speed for age/gender General Gait Details: multimodal cues for PWB status; pt fatigued and requiring 2 standing rest breaks; Lt LE buckling intermittently    Stairs            Wheelchair Mobility    Modified Rankin (Stroke Patients Only)       Balance Overall balance assessment: Needs assistance Sitting-balance support:  Feet supported;No upper extremity supported Sitting balance-Leahy Scale: Good Sitting balance - Comments: pt finishing up her bathing on EOB  independently    Standing balance support: During functional activity;Bilateral upper extremity supported Standing balance-Leahy Scale: Poor Standing balance comment: RW to balanc and maintain PWB status                     Cognition Arousal/Alertness: Awake/alert Behavior During Therapy: WFL for tasks assessed/performed Overall Cognitive Status: Within Functional Limits for tasks assessed                      Exercises Total Joint Exercises Ankle Circles/Pumps: 15 reps Quad Sets: AROM;Left;10 reps;Seated Short Arc Quad: AROM;Left;10 reps;Supine Heel Slides: AAROM;Left;10 reps;Seated Hip ABduction/ADduction: AAROM;Left;10 reps;Supine Long Arc Quad: AROM;Left;10 reps Goniometric ROM: 5 to 85 degrees    General Comments General comments (skin integrity, edema, etc.): encourage dOBO with nursing       Pertinent Vitals/Pain Pain Assessment: No/denies pain Pain Score: 2  Pain Location: pt stated " my leg just feels tired" Pain Descriptors / Indicators: Sore Pain Intervention(s): Monitored during session;Premedicated before session;Repositioned    Home Living Family/patient expects to be discharged to:: Private residence Living Arrangements: Spouse/significant other Available Help at Discharge: Family;Available 24 hours/day Type of Home: House Home Access: Stairs to enter Entrance Stairs-Rails: None Home Layout: One level Home Equipment: Environmental consultant - 2 wheels;Bedside commode;Tub bench Additional Comments: pt has tub shower    Prior Function Level of Independence: Independent with assistive device(s)      Comments: cane for long distances   PT Goals (current goals can now  be found in the care plan section) Acute Rehab PT Goals Patient Stated Goal: go home tomorrow PT Goal Formulation: With patient Time For Goal  Achievement: 04/02/14 Potential to Achieve Goals: Good Progress towards PT goals: Progressing toward goals    Frequency  7X/week    PT Plan Current plan remains appropriate    Co-evaluation             End of Session Equipment Utilized During Treatment: Gait belt Activity Tolerance: Patient tolerated treatment well Patient left: in bed;with call bell/phone within reach     Time: 1310-1334 PT Time Calculation (min) (ACUTE ONLY): 24 min  Charges:  $Gait Training: 8-22 mins $Therapeutic Exercise: 8-22 mins                    G CodesGustavus Bryant, [place]  [phone] 03/26/2014, 2:11 PM

## 2014-03-26 NOTE — Progress Notes (Signed)
Orthopedic Tech Progress Note Patient Details:  [name] 04-11-1945 [id number] On cpm at 2:50 pm Patient ID: [name], female   DOB: [birth year], 69 y.o.   MRN: [id number]   Braulio Bosch 03/26/2014, 2:49 PM

## 2014-03-26 NOTE — Discharge Instructions (Signed)

## 2014-03-26 NOTE — Progress Notes (Signed)
Orthopedic Tech Progress Note Patient Details:  [name] [birth year] [id number] Off cpm at 7:45 pm Patient ID: [name], female   DOB: 07-26-1945, 69 y.o.   MRN: [id number]   Braulio Bosch 03/26/2014, 7:44 PM

## 2014-03-26 NOTE — Op Note (Signed)
[name], [name]             ACCOUNT NO.:  [id number]  MEDICAL RECORD NO.:  [id number]  LOCATION:  [id number]                        FACILITY:  [facility]  PHYSICIAN:  Vonna Kotyk. Hendry Speas, M.D.DATE OF BIRTH:  1945/03/13  DATE OF PROCEDURE:  03/25/2014 DATE OF DISCHARGE:                              OPERATIVE REPORT   PREOPERATIVE DIAGNOSIS:  End-stage osteoarthritis/rheumatoid arthritis, left knee.  POSTOPERATIVE DIAGNOSIS:  End-stage osteoarthritis/rheumatoid arthritis, left knee.  PROCEDURE:  Left total knee replacement.  SURGEON:  Vonna Kotyk. Durward Fortes, MD  ASSISTANT:  Mike Craze. Petrarca, PA-C  ANESTHESIA:  General with supplemental femoral nerve block.  COMPLICATIONS:  None.  COMPONENTS:  DePuy LCS standard femoral component a #2.5 revision tibial tray with a 10-mm polyethylene bridging bearing and metal-backed 3-peg rotating patella.  All components were secured with polymethyl methacrylate.  DESCRIPTION OF PROCEDURE:  [name] was met in the holding area with her husband.  I identified the left knee as appropriate operative site. She did receive a preoperative femoral nerve block per Anesthesia.  The patient was then transported to room #7 and placed under general anesthesia without difficulty.  Left lower extremity was then placed in a thigh tourniquet.  The leg was prepped with chlorhexidine and scrubbed and DuraPrep x2 from the tourniquet to the tips of the toes.  Sterile draping was performed.  Time-out was called.  The left lower extremity was then elevated and Esmarch exsanguinated with a proximal tourniquet at 350 mmHg.  The patient had a prior patella procedure many years ago and had a midline longitudinal incision.  This was elliptically excised and then used as the main incision.  The first layer of capsule was incised in the midline.  A medial parapatellar incision was then made with the Bovie.  The joint was entered.  There was a clear yellow joint  effusion. With some difficulty, the patella was everted to 180 degrees based on the amount of deformity and the knee flexed to 90 degrees.  There were large osteophytes along the medial and lateral femoral condyle and about the patella.  There was little if any articular cartilage remaining along the medial femoral condyle and tibial plateau and the same laterally.  Osteophytes were removed from the femoral condyle and the medial tibial plateau, then I sized a standard femoral component.  A synovectomy was performed.  Based on the patient's size and poor bone quality, I elected to use the revision tibial tray.  The first bony cut was then made transversing the proximal tibia with a 3-degree angle of declination.  I checked my alignment with the external guide after the bony cut.  Subsequent cuts were then made on the femur using the standard femoral jig and again with every bony cut I checked my external alignment.  Flexion-extension gaps were symmetrical at 10 mm.  I inserted a lamina spreader along the medial and lateral compartment, removed the medial and lateral menisci, ACL and PCL.  There were 5-6 relatively large osteochondral loose bodies imbedded within the PCL and the posterior and medial compartments.  Synovectomy was performed.  Final cuts were then made on the femur.  I used a 3-degree distal femoral valgus cut.  The tapered  cuts were then made with the finishing guide.  Again, we checked the flexion extension gaps were perfectly symmetrical at 10 mm.  MCL and LCL remained intact.  Large osteophytes removed from the posterior femoral condyle with a 3/4-inch curved osteotome.  Retractors were then placed about the tibia and measured with 2.5 tibial tray.  The tray was pinned in place and checked with the external guide. A center hole was then made for the revision tibial tray.  We then inserted the trial tibial tray and the keeled cut.  With this in place, a 10-mm  bridging bearing was inserted followed by the trial standard femoral component, then this was reduced and through a full range of motion we had excellent stability with varus and valgus stress, negative anterior drawer sign, full extension, and the patient did have a preoperative flexion contracture.  The patella was prepared by removing approximately 10 mm of bone leaving 13 mm of patellar thickness.  The trial jig was applied.  The three holes made, then the trial patella inserted through full range of motion and remained perfectly stable.  Trial components were then removed.  The joint was copiously irrigated with saline solution.  The final components were then inserted with polymethyl methacrylate. We initially inserted the revision tibial tray followed by the 10-mm polyethylene bridging bearing and the standard femoral component.  These were impacted in place and extraneous methacrylate was removed from the periphery of the components.  The patella was applied with methacrylate and a patellar clamp.  At approximately 16 minutes of methacrylate had matured, during which time, we irrigated the joint and then injected the deep capsule with 0.25% Marcaine with epinephrine.  At 92 minutes, the tourniquet was deflated.  We had nice capillary refill to the joint surface.  Gross bleeders were Bovie coagulated. Medium-size Hemovac was inserted through the lateral capsule.  The deep capsule was then closed with #1 Ethibond, superficial capsule with a running 0 Vicryl, subcu with Vicryl, and 3-0 Monocryl, skin closed with skin clips.  Sterile bulky dressing was applied followed by the patient's support stocking.  The patient had significant deformity which made the procedure technically difficult but felt the end result was excellent.     Vonna Kotyk. Durward Fortes, M.D.     PWW/MEDQ  D:  03/25/2014  T:  03/26/2014  Job:  [id number]

## 2014-03-26 NOTE — Evaluation (Signed)
Occupational Therapy Evaluation and Discharge Patient Details Name: [name] MRN: [id number] DOB: 1945/10/13 Today's Date: 03/26/2014    History of Present Illness Pt is a 69 y.o. female s/p Lt TKA.    Clinical Impression   PTA pt lived at home and was independent with use of SPC. Pt is currently at Supervision level for functional mobility and moving well with minimal pain. Pt requires assistance for LB ADLs due to decreased ROM however pt has family assist for LB ADLs. Pt had DME at home and no further acute OT needs.     Follow Up Recommendations  No OT follow up    Equipment Recommendations  None recommended by OT    Recommendations for Other Services       Precautions / Restrictions Precautions Precautions: Knee Precaution Comments: reinforced education on precautions Restrictions Weight Bearing Restrictions: Yes LLE Weight Bearing: Partial weight bearing LLE Partial Weight Bearing Percentage or Pounds: 50      Mobility Bed Mobility               General bed mobility comments: Pt sitting up in recliner when OT arrived.   Transfers Overall transfer level: Needs assistance Equipment used: Rolling walker (2 wheeled) Transfers: Sit to/from Stand Sit to Stand: Supervision         General transfer comment: Supervision for safety.          ADL Overall ADL's : Needs assistance/impaired Eating/Feeding: Independent;Sitting   Grooming: Supervision/safety;Standing   Upper Body Bathing: Set up;Sitting   Lower Body Bathing: Minimal assistance;Sit to/from stand   Upper Body Dressing : Set up;Sitting   Lower Body Dressing: Moderate assistance;Sit to/from stand   Toilet Transfer: Supervision/safety;Ambulation;RW (BSC over toilet)   Toileting- Clothing Manipulation and Hygiene: Supervision/safety;Sit to/from stand   Tub/ Shower Transfer: Tub transfer;Supervision/safety;Tub bench;Rolling walker   Functional mobility during ADLs:  Supervision/safety;Rolling walker       Vision Vision Assessment?: No apparent visual deficits          Pertinent Vitals/Pain Pain Assessment: No/denies pain Pain Score: 2  Pain Location: Lt knee with WB Pain Descriptors / Indicators: Sore Pain Intervention(s): Monitored during session;Premedicated before session;Repositioned        Extremity/Trunk Assessment Upper Extremity Assessment Upper Extremity Assessment: Overall WFL for tasks assessed   Lower Extremity Assessment Lower Extremity Assessment: Defer to PT evaluation    Cervical / Trunk Assessment Cervical / Trunk Assessment: Other exceptions Cervical / Trunk Exceptions: forward posture due to body shape   Communication Communication Communication: No difficulties   Cognition Arousal/Alertness: Awake/alert Behavior During Therapy: WFL for tasks assessed/performed Overall Cognitive Status: Within Functional Limits for tasks assessed                                Home Living Family/patient expects to be discharged to:: Private residence Living Arrangements: Spouse/significant other Available Help at Discharge: Family;Available 24 hours/day Type of Home: House Home Access: Stairs to enter [employer] of Steps: 2 Entrance Stairs-Rails: None Home Layout: One level     Bathroom Shower/Tub: Tub/shower unit Shower/tub characteristics: Curtain Biochemist, clinical: Standard     Home Equipment: Environmental consultant - 2 wheels;Bedside commode;Tub bench   Additional Comments: pt has tub shower      Prior Functioning/Environment Level of Independence: Independent with assistive device(s)        Comments: cane for long distances    OT Diagnosis: Generalized weakness;Acute pain  End of Session Equipment Utilized During Treatment: Rolling walker CPM Left Knee CPM Left Knee: Off  Activity Tolerance: Patient tolerated treatment well Patient left: in chair;with call bell/phone within reach   Time:  1145-1206 OT Time Calculation (min): 21 min Charges:  OT General Charges $OT Visit: 1 Procedure OT Evaluation $Initial OT Evaluation Tier I: 1 Procedure G-Codes:    Juluis Rainier 04/09/2014, 12:23 PM  Cyndie Chime, OTR/L Occupational Therapist [phone] (pager)

## 2014-03-27 LAB — BASIC METABOLIC PANEL
Anion gap: 7 (ref 5–15)
BUN: 13 mg/dL (ref 6–23)
CO2: 28 mmol/L (ref 19–32)
Calcium: 8.4 mg/dL (ref 8.4–10.5)
Chloride: 101 mmol/L (ref 96–112)
Creatinine, Ser: 1.18 mg/dL — ABNORMAL HIGH (ref 0.50–1.10)
GFR calc Af Amer: 54 mL/min — ABNORMAL LOW (ref >90)
GFR calc non Af Amer: 46 mL/min — ABNORMAL LOW (ref >90)
Glucose, Bld: 136 mg/dL — ABNORMAL HIGH (ref 70–99)
Potassium: 3.5 mmol/L (ref 3.5–5.1)
Sodium: 136 mmol/L (ref 135–145)

## 2014-03-27 LAB — CBC
HCT: 27.8 % — ABNORMAL LOW (ref 36.0–46.0)
Hemoglobin: 8.7 g/dL — ABNORMAL LOW (ref 12.0–15.0)
MCH: 28.3 pg (ref 26.0–34.0)
MCHC: 31.3 g/dL (ref 30.0–36.0)
MCV: 90.6 fL (ref 78.0–100.0)
Platelets: 237 10*3/uL (ref 150–400)
RBC: 3.07 MIL/uL — ABNORMAL LOW (ref 3.87–5.11)
RDW: 13.7 % (ref 11.5–15.5)
WBC: 12.6 10*3/uL — ABNORMAL HIGH (ref 4.0–10.5)

## 2014-03-27 MED ORDER — OXYCODONE HCL 5 MG PO TABS: 5.0000 mg | ORAL_TABLET | ORAL | Status: DC | PRN

## 2014-03-27 MED ORDER — RIVAROXABAN 10 MG PO TABS: 10.0000 mg | ORAL_TABLET | Freq: Every day | ORAL | Status: DC

## 2014-03-27 MED ORDER — METHOCARBAMOL 500 MG PO TABS: 500.0000 mg | ORAL_TABLET | Freq: Three times a day (TID) | ORAL | Status: DC | PRN

## 2014-03-27 NOTE — Progress Notes (Signed)
Physical Therapy Treatment Patient Details Name: [name] MRN: [id number] DOB: [birth year] Today's Date: 03/27/2014    History of Present Illness Pt is a 69 y.o. female s/p Lt TKA.     PT Comments    Pt making great progress with mobility. PT goals set today with covering PT Lorrin Goodell, PT) and to be co-signed by said PT. Pt safe to discharge home with family assistance.  Follow Up Recommendations  Home health PT;Supervision/Assistance - 24 hour     Equipment Recommendations  None recommended by PT       Precautions / Restrictions Precautions Precautions: Knee Restrictions Weight Bearing Restrictions: Yes LLE Weight Bearing: Partial weight bearing LLE Partial Weight Bearing Percentage or Pounds: 50    Mobility  Bed Mobility Overal bed mobility: Needs Assistance Bed Mobility: Supine to Sit     Supine to sit: Supervision     General bed mobility comments: pt educated on use of sheet/belt to move left leg to edge and off edge of bed. Bed was flat with no rails used.  Transfers Overall transfer level: Needs assistance Equipment used: Rolling walker (2 wheeled) Transfers: Sit to/from Stand Sit to Stand: Supervision         General transfer comment: pt demo'd safe techinique x 2 reps  Ambulation/Gait Ambulation/Gait assistance: Min guard;Supervision Ambulation Distance (Feet): 45 Feet Assistive device: Rolling walker (2 wheeled) Gait Pattern/deviations: Step-to pattern;Decreased step length - right;Decreased stance time - left;Antalgic;Trunk flexed;Decreased weight shift to left Gait velocity: decr Gait velocity interpretation: Below normal speed for age/gender General Gait Details: pt needed cues on upright posture and to increase right step length. Easily fatiqued and mild shortness of breath limited gait today. No buckling noted with left knee today.   Stairs Stairs: Yes Stairs assistance: Min assist Stair Management: Backwards;With walker;Step to  pattern Number of Stairs: 2 General stair comments: PTA demo'd technique with walker going backwards up stairs. MIn assist to stabilize walker with pt practice, supervison for pt balance with reminder cues on correct technique. Handout given to pt after practice.         Cognition Arousal/Alertness: Awake/alert Behavior During Therapy: WFL for tasks assessed/performed Overall Cognitive Status: Within Functional Limits for tasks assessed         Exercises Total Joint Exercises Ankle Circles/Pumps: AROM;Both;10 reps;Supine Quad Sets: AROM;Strengthening;Left;10 reps;Supine Short Arc Quad: AROM;Strengthening;Left;10 reps;Supine Heel Slides: AAROM;Strengthening;Left;10 reps;Supine Straight Leg Raises: AROM;AAROM;Strengthening;Left;10 reps;Supine (progressed to active with repitition)     Pertinent Vitals/Pain Pain Assessment: 0-10 Pain Score: 5  Pain Descriptors / Indicators: Sore;Aching Pain Intervention(s): Monitored during session;Premedicated before session;Ice applied;Repositioned           PT Goals (current goals can now be found in the care plan section) Acute Rehab PT Goals Patient Stated Goal: go home today PT Goal Formulation: With patient Time For Goal Achievement: 04/02/14 Potential to Achieve Goals: Good Progress towards PT goals: Progressing toward goals (eval PT did not set goals, goals established with covering PT today and PT to cosign them in this note)    Frequency  7X/week    PT Plan Current plan remains appropriate       End of Session Equipment Utilized During Treatment: Gait belt Activity Tolerance: Patient tolerated treatment well Patient left: in chair;with call bell/phone within reach     Time: 0954-1030 PT Time Calculation (min) (ACUTE ONLY): 36 min  Charges:  $Gait Training: 8-22 mins $Therapeutic Exercise: 8-22 mins  G Codes:      Willow Ora 03/27/2014, 12:36 PM  Willow Ora, PTA, CLT Acute Rehab  Services [phone] @TODAYSDATE @, 12:38 PM

## 2014-03-27 NOTE — Progress Notes (Signed)
[facility] discharged home with home health per MD order. Discharge instructions reviewed and discussed with patient. All questions and concerns answered. Copy of instructions and scripts given to patient. IV removed.  Patient escorted to car by staff in a wheelchair. No distress noted upon discharge.   Tarri Abernethy R 03/27/2014 10:22 AM

## 2014-03-27 NOTE — Discharge Summary (Signed)
[name], MD   Biagio Borg, PA-C [address], [place], [place]  [address]                             [phone]  PATIENT ID: JALEN OBERRY        MRN:  [id number]          DOB/AGE: [birth year] / 69 y.o.    DISCHARGE SUMMARY  ADMISSION DATE:    03/25/2014 DISCHARGE DATE:   03/27/2014   ADMISSION DIAGNOSIS: LEFT KNEE END STAGE OSTEOARTHRITIS    DISCHARGE DIAGNOSIS:  LEFT KNEE END STAGE OSTEOARTHRITIS    ADDITIONAL DIAGNOSIS: Principal Problem:   Osteoarthritis of left knee Active Problems:   Rheumatoid arthritis of knee   S/P total knee replacement using cement  Past Medical History  Diagnosis Date  . Hypertension   . H/O cardiovascular stress test 02/2014    seen by Dr. Einar Gip - told that she was cleared for surgery  . Heart murmur   . Shortness of breath dyspnea   . Sleep apnea     CPAP in use- QO night, study done at [facility]- 2007   . Arthritis     Rheumatoid, followed by Dr. Estanislado Pandy, knees & ankles   . Anemia     PROCEDURE: Procedure(s): TOTAL KNEE ARTHROPLASTY Left on 03/25/2014  CONSULTS: none     HISTORY: Jetta is a very pleasant 69 year old African American female who is seen today for evaluation of bilateral knee pain, left greater than right. She has had a history of rheumatoid arthritis and has been treated with Humira as part of her treatment plan. She has been tried with other rheumatologic medications and not with much relief in regard to her methotrexate, etc. She did have an arthroscopic debridement performed on the left knee by Dr. Shellia Carwin back in 1993. She has had problems with her knees for a long time. Many years ago she apparently had some problems with a dislocating patella. She has now gotten to the point where she has to use a cane, because her knee pain is so problematic for her. Her left is much more symptomatic than her right. She does have swelling as well as popping and clicking and aching pain. Her symptoms have  worsened to the point where she is having difficulty with activities of daily living. She has nighttime pain. She has pain with ambulation also. She denies any neurovascular compromise. She does not really remember much in the way of any history of injury or trauma  HOSPITAL COURSE:  Fusako S Azpeitia is a 69 y.o. admitted on 03/25/2014 and found to have a diagnosis of [place].  After appropriate laboratory studies were obtained  they were taken to the operating room on 03/25/2014 and underwent  Procedure(s): TOTAL KNEE ARTHROPLASTY  Left.   They were given perioperative antibiotics:  Anti-infectives    Start     Dose/Rate Route Frequency Ordered Stop   03/25/14 1830  ceFAZolin (ANCEF) IVPB 2 g/50 mL premix     2 g 100 mL/hr over 30 Minutes Intravenous Every 6 hours 03/25/14 1744 03/26/14 0027   03/25/14 0600  ceFAZolin (ANCEF) IVPB 2 g/50 mL premix     2 g 100 mL/hr over 30 Minutes Intravenous On call to O.R. 03/24/14 1305 03/25/14 1232    .  Tolerated the procedure well.  Placed with a foley intraoperatively.    Toradol was given post  op.  POD #1, allowed out of bed to a chair.  PT for ambulation and exercise program.  IV saline locked.  O2 discontionued. Hemovac pulled.  POD #2, continued PT and ambulation.  Dressing changed.  Wound clean and dry.  The remainder of the hospital course was dedicated to ambulation and strengthening.   The patient was discharged on 2 Days Post-Op in  Stable condition.  Blood products given:none  DIAGNOSTIC STUDIES: Recent vital signs: Patient Vitals for the past 24 hrs:  BP Temp Temp src Pulse Resp SpO2  03/27/14 0450 (!) 120/51 mmHg 99.5 F (37.5 C) Oral 92 - 100 %  03/27/14 0000 - - - - 16 96 %  03/26/14 2203 - - - 97 16 96 %  03/26/14 2056 122/63 mmHg 99.7 F (37.6 C) Oral 93 16 100 %  03/26/14 2000 - - - - 16 96 %  03/26/14 1600 - - - - 18 -  03/26/14 1414 (!) 107/55 mmHg 99.7 F (37.6 C) Oral 84 20 100 %    03/26/14 1200 - - - - 18 -       Recent laboratory studies:  Recent Labs  03/26/14 0450 03/27/14 0552  WBC 11.2* 12.6*  HGB 9.5* 8.7*  HCT 30.0* 27.8*  PLT 215 237    Recent Labs  03/26/14 0450 03/27/14 0552  NA 138 136  K 4.2 3.5  CL 104 101  CO2 24 28  BUN 14 13  CREATININE 1.28* 1.18*  GLUCOSE 106* 136*  CALCIUM 8.5 8.4   Lab Results  Component Value Date   INR 1.05 03/17/2014   INR 1.02 01/15/2010     Recent Radiographic Studies :  Dg Chest 2 View  03/17/2014   CLINICAL DATA:  Preop for left total knee replacement, former smoking history  EXAM: CHEST  2 VIEW  COMPARISON:  Chest x-ray of 09/13/2012  FINDINGS: No active infiltrate or effusion is seen. Mediastinal and hilar contours are unremarkable. The heart is borderline enlarged. There are degenerative changes throughout the thoracic spine and within both shoulders.  IMPRESSION: No active cardiopulmonary disease.   Electronically Signed   By: Ivar Drape M.D.   On: 03/17/2014 15:38    DISCHARGE INSTRUCTIONS: Discharge Instructions    CPM    Complete by:  As directed   Continuous passive motion machine (CPM):      Use the CPM from 0 to 60 for 6-8 hours per day.      You may increase by 5-10 degrees per day.  You may break it up into 2 or 3 sessions per day.      Use CPM for 3-4  weeks or until you are told to stop.     Call MD / Call 911    Complete by:  As directed   If you experience chest pain or shortness of breath, CALL 911 and be transported to the hospital emergency room.  If you develope a fever above 101 F, pus (white drainage) or increased drainage or redness at the wound, or calf pain, call your surgeon's office.     Change dressing    Complete by:  As directed   DO NOT CHANGE YOUR DRESSING     Constipation Prevention    Complete by:  As directed   Drink plenty of fluids.  Prune juice may be helpful.  You may use a stool softener, such as Colace (over the counter) 100 mg twice a day.  Use MiraLax  (over the  counter) for constipation as needed.     Diet general    Complete by:  As directed      Do not put a pillow under the knee. Place it under the heel.    Complete by:  As directed      Driving restrictions    Complete by:  As directed   No driving for 6 weeks     Increase activity slowly as tolerated    Complete by:  As directed      Lifting restrictions    Complete by:  As directed   No lifting for 6 weeks     Partial weight bearing    Complete by:  As directed   % Body Weight:  50%  Laterality:  left  Extremity:  Lower     Patient may shower    Complete by:  As directed   You may shower over the brown dressing     TED hose    Complete by:  As directed   Use stockings (TED hose) for 2-3 weeks on right leg.  You may remove them at night for sleeping.           DISCHARGE MEDICATIONS:     Medication List    STOP taking these medications        aspirin 81 MG tablet     cyclobenzaprine 10 MG tablet  Commonly known as:  FLEXERIL     HUMIRA 40 MG/0.8ML injection  Generic drug:  adalimumab     naproxen 500 MG tablet  Commonly known as:  NAPROSYN     traMADol 50 MG tablet  Commonly known as:  ULTRAM      TAKE these medications        ferrous sulfate 325 (65 FE) MG EC tablet  Take 325 mg by mouth daily with breakfast.     folic acid 1 MG tablet  Commonly known as:  FOLVITE  Take 2 mg by mouth daily.     hydrochlorothiazide 25 MG tablet  Commonly known as:  HYDRODIURIL  Take 25 mg by mouth daily.     methocarbamol 500 MG tablet  Commonly known as:  ROBAXIN  Take 1 tablet (500 mg total) by mouth every 8 (eight) hours as needed for muscle spasms.     multivitamin with minerals Tabs tablet  Take 1 tablet by mouth daily.     oxyCODONE 5 MG immediate release tablet  Commonly known as:  Oxy IR/ROXICODONE  Take 1-2 tablets (5-10 mg total) by mouth every 3 (three) hours as needed for moderate pain, severe pain or breakthrough pain.     ramipril 2.5 MG  capsule  Commonly known as:  ALTACE  Take 2.5 mg by mouth daily.     rivaroxaban 10 MG Tabs tablet  Commonly known as:  XARELTO  Take 1 tablet (10 mg total) by mouth daily with breakfast.        FOLLOW UP VISIT:       Follow-up Information    Follow up with [facility].   Why:  They will contact you to schedule home therapy visits.   Contact information:   [address] [place] [address] [phone]       Follow up with Garald Balding, MD. Schedule an appointment as soon as possible for a visit on 04/09/2014.   Specialty:  Orthopedic Surgery   Contact information:   [address] [place] [address] [phone]  DISPOSITION:   Home  CONDITION:  Stable   Aaron Edelman D. [place], [place] [phone]  03/27/2014 10:20 AM

## 2014-03-27 NOTE — Progress Notes (Signed)
Patient ID: [name], female   DOB: 1945/09/21, 69 y.o.   MRN: [id number] PATIENT ID: [name]        MRN:  [id number]          DOB/AGE: 04/15/1945 / 69 y.o.    Brittney Fears, MD   Brittney Borg, PA-C [address] [place], [place]  [address]                             [phone]   PROGRESS NOTE  Subjective:  negative for Chest Pain  negative for Shortness of Breath  negative for Nausea/Vomiting   negative for Calf Pain    Tolerating Diet: yes         Patient reports pain as 4 on 0-10 scale.     Anxious for discharge  Objective: Vital signs in last 24 hours:   Patient Vitals for the past 24 hrs:  BP Temp Temp src Pulse Resp SpO2  03/27/14 0450 (!) 120/51 mmHg 99.5 F (37.5 C) Oral 92 - 100 %  03/27/14 0000 - - - - 16 96 %  03/26/14 2203 - - - 97 16 96 %  03/26/14 2056 122/63 mmHg 99.7 F (37.6 C) Oral 93 16 100 %  03/26/14 2000 - - - - 16 96 %  03/26/14 1600 - - - - 18 -  03/26/14 1414 (!) 107/55 mmHg 99.7 F (37.6 C) Oral 84 20 100 %  03/26/14 1200 - - - - 18 -      Intake/Output from previous day:   03/18 0701 - 03/19 0700 In: 480 [P.O.:480] Out: -    Intake/Output this shift:   03/19 0701 - 03/19 1900 In: 360 [P.O.:360] Out: -    Intake/Output      03/18 0701 - 03/19 0700 03/19 0701 - 03/20 0700   P.O. 480 360   I.V. (mL/kg)     IV Piggyback     Total Intake(mL/kg) 480 (5) 360 (3.7)   Urine (mL/kg/hr)     Drains     Total Output       Net +480 +360        Urine Occurrence 3 x       LABORATORY DATA:  Recent Labs  03/26/14 0450 03/27/14 0552  WBC 11.2* 12.6*  HGB 9.5* 8.7*  HCT 30.0* 27.8*  PLT 215 237    Recent Labs  03/26/14 0450 03/27/14 0552  NA 138 136  K 4.2 3.5  CL 104 101  CO2 24 28  BUN 14 13  CREATININE 1.28* 1.18*  GLUCOSE 106* 136*  CALCIUM 8.5 8.4   Lab Results  Component Value Date   INR 1.05 03/17/2014   INR 1.02 01/15/2010    Recent Radiographic Studies :  Dg Chest 2 View  03/17/2014    CLINICAL DATA:  Preop for left total knee replacement, former smoking history  EXAM: CHEST  2 VIEW  COMPARISON:  Chest x-ray of 09/13/2012  FINDINGS: No active infiltrate or effusion is seen. Mediastinal and hilar contours are unremarkable. The heart is borderline enlarged. There are degenerative changes throughout the thoracic spine and within both shoulders.  IMPRESSION: No active cardiopulmonary disease.   Electronically Signed   By: Ivar Drape M.D.   On: 03/17/2014 15:38     Examination:  General appearance: alert, cooperative and no distress  Wound Exam: clean, dry, intact   Drainage:  None: wound tissue dry  Motor Exam: EHL, FHL, Anterior Tibial and Posterior Tibial Intact  Sensory Exam: Superficial Peroneal, Deep Peroneal and Tibial normal  Vascular Exam: Normal  Assessment:    2 Days Post-Op  Procedure(s) (LRB): TOTAL KNEE ARTHROPLASTY (Left)  ADDITIONAL DIAGNOSIS:  Principal Problem:   Osteoarthritis of left knee Active Problems:   Rheumatoid arthritis of knee   S/P total knee replacement using cement  Acute Blood Loss Anemia- stable and asymptomatic   Plan: Physical Therapy as ordered Partial Weight Bearing @ 50% (PWB)  DVT Prophylaxis:  Xarelto and TED hose  DISCHARGE PLAN: Home  DISCHARGE NEEDS: HHPT, CPM, Walker and 3-in-1 comode seat   ambulating in hall with PT, voiding without difficulty, no nausea-dressing changed... Will plan discharge today   [name]  03/27/2014 10:17 AM

## 2014-03-30 ENCOUNTER — Encounter (HOSPITAL_COMMUNITY): Payer: Self-pay | Admitting: Orthopaedic Surgery

## 2014-04-01 ENCOUNTER — Encounter: Payer: Self-pay | Admitting: Orthopedic Surgery

## 2014-04-08 ENCOUNTER — Other Ambulatory Visit (HOSPITAL_COMMUNITY): Payer: Self-pay | Admitting: Orthopaedic Surgery

## 2014-04-08 ENCOUNTER — Ambulatory Visit (HOSPITAL_COMMUNITY)
Admission: RE | Admit: 2014-04-08 | Discharge: 2014-04-08 | Disposition: A | Discharge status: Home / Self Care | Payer: Medicare Other | Source: Ambulatory Visit | Attending: Family Medicine | Admitting: Family Medicine

## 2014-04-08 DIAGNOSIS — M7989 Other specified soft tissue disorders: Secondary | ICD-10-CM

## 2014-04-08 DIAGNOSIS — M79605 Pain in left leg: Secondary | ICD-10-CM | POA: Diagnosis not present

## 2014-04-08 NOTE — Progress Notes (Signed)
VASCULAR LAB PRELIMINARY  PRELIMINARY  PRELIMINARY  PRELIMINARY  Left lower extremity venous duplex completed.    Preliminary report:  Left:  No evidence of DVT, superficial thrombosis, or Baker's cyst.  Jasara Corrigan, RVS 04/08/2014, 9:04 PM

## 2014-04-12 ENCOUNTER — Ambulatory Visit: Payer: Medicare Other | Attending: Orthopaedic Surgery | Admitting: Physical Therapy

## 2014-04-12 DIAGNOSIS — M25562 Pain in left knee: Secondary | ICD-10-CM | POA: Insufficient documentation

## 2014-04-12 DIAGNOSIS — M25662 Stiffness of left knee, not elsewhere classified: Secondary | ICD-10-CM | POA: Diagnosis not present

## 2014-04-12 NOTE — Therapy (Signed)
[facility] [place], [place], [address] Phone: [phone]   Fax:  [phone]  Physical Therapy Evaluation  Patient Details  Name: [name] MRN: [id number] Date of Birth: Jul 08, 1945 Referring Provider:  Garald Balding, MD  Encounter Date: 04/12/2014      PT End of Session - 04/12/14 1026    Visit Number 1   Number of Visits 12   Date for PT Re-Evaluation 06/07/14   PT Start Time 0950   PT Stop Time 1026   PT Time Calculation (min) 36 min   Activity Tolerance Patient tolerated treatment well   Behavior During Therapy [facility] for tasks assessed/performed      Past Medical History  Diagnosis Date  . Hypertension   . H/O cardiovascular stress test 02/2014    seen by Dr. Einar Gip - told that she was cleared for surgery  . Heart murmur   . Shortness of breath dyspnea   . Sleep apnea     CPAP in use- QO night, study done at [facility]- 2007   . Arthritis     Rheumatoid, followed by Dr. Estanislado Pandy, knees & ankles   . Anemia     Past Surgical History  Procedure Laterality Date  . Abdominal hysterectomy    . Hand surgery    . Knee surgery  1990's   . Foot surgery Bilateral     bunionectomy, calluses , also has several pins & screws  . Carpal tunnel release Right   . Tubal ligation    . Joint replacement      planned for 03/25/2014  . Total knee arthroplasty Left 03/25/2014    Procedure: TOTAL KNEE ARTHROPLASTY;  Surgeon: Garald Balding, MD;  Location: [place];  Service: Orthopedics;  Laterality: Left;    There were no vitals filed for this visit.  Visit Diagnosis:  Left knee pain - Plan: PT plan of care cert/re-cert  Knee stiffness, left - Plan: PT plan of care cert/re-cert      Subjective Assessment - 04/12/14 0956    Subjective Had HH PT and did good.   Patient Stated Goals Get off walker.   Pain Score 2    Pain Location Knee   Pain Orientation Left   Pain Descriptors / Indicators Aching   Pain Type  Surgical pain   Pain Frequency Intermittent   Multiple Pain Sites No            [facility] PT Assessment - 04/12/14 0001    Assessment   Medical Diagnosis Left total knee replacement.   Onset Date 03/25/14   Next MD Visit --  04/23/14   Precautions   Precautions --  No ultrasound.   Balance Screen   Has the patient fallen in the past 6 months No   Has the patient had a decrease in activity level because of a fear of falling?  No   Is the patient reluctant to leave their home because of a fear of falling?  No   ROM / Strength   AROM / PROM / Strength AROM;Strength   AROM   Overall AROM Comments Left knee 0 to 96 degrees.   Strength   Overall Strength Comments Left hip and knee 4+/5.   Palpation   Palpation --  Tender left medial patella.   Special Tests    Special Tests --  Minimal left knee edema.   Ambulation/Gait   Ambulation/Gait --  Patient ind with a FWW.  [facility] Adult PT Treatment/Exercise - 12-May-2014 0001    Modalities   Modalities Cryotherapy   Cryotherapy   Number Minutes Cryotherapy 15 Minutes   Cryotherapy Location Knee   Type of Cryotherapy --  Medium vasopneumatic.                  PT Short Term Goals - 12-May-2014 1030    PT SHORT TERM GOAL #1   Title Ind with initial HEP.   Time 2   Period Weeks   Status New           PT Long Term Goals - May 12, 2014 1031    PT LONG TERM GOAL #1   Title Ind with advanced HEP.   Time 8   Period Weeks   Status New   PT LONG TERM GOAL #2   Title Active left knee flexion to 115 degrees+.   Time 8   Period Weeks   Status New   PT LONG TERM GOAL #3   Title 5/5 left LE strength.   Time 8   Period Weeks   Status New   PT LONG TERM GOAL #4   Title Perform all ADL's with pain not > 2/10   Status New               Plan - May 12, 2014 1026    Clinical Impression Statement The patient underwent a left total knee replacement on 03/25/14.  She had HH PT and is pleased with  her progress thus far.  She has no pain at rest today.  She reports a "catch" however.   Pt will benefit from skilled therapeutic intervention in order to improve on the following deficits Decreased range of motion;Pain   Rehab Potential Excellent   PT Frequency 2x / week   PT Duration 8 weeks   PT Treatment/Interventions Patient/family education;ADLs/Self Care Home Management;Therapeutic activities;Therapeutic exercise;Passive range of motion;Manual techniques;Gait training;Neuromuscular re-education;Cryotherapy;Electrical Stimulation   PT Next Visit Plan Left TKA protocol.   Consulted and Agree with Plan of Care Patient          G-Codes - 05/12/14 1032    Functional Assessment Tool Used FOTO.   Functional Limitation Mobility: Walking and moving around   Mobility: Walking and Moving Around Current Status [phone]) At least 40 percent but less than 60 percent impaired, limited or restricted   Mobility: Walking and Moving Around Goal Status [phone]) At least 20 percent but less than 40 percent impaired, limited or restricted       Problem List Patient Active Problem List   Diagnosis Date Noted  . Primary osteoarthritis of left knee 03/25/2014  . Rheumatoid arthritis of knee 03/25/2014  . 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 DERMATITIS 09/28/2008  . CONSTIPATION 05/25/2008  . DYSPNEA ON EXERTION 05/25/2008  . ANEMIA, NORMOCYTIC 04/13/2008  . ANKLE PAIN, BILATERAL 11/25/2007  . [place] DISEASE 08/15/2006  . HYPERLIPIDEMIA 08/14/2006  . MORBID OBESITY 12/13/2005  . CARPAL TUNNEL SYNDROME 12/13/2005  . PERIPHERAL NEUROPATHY 12/13/2005  . HYPERTENSION 12/13/2005  . DYSRHYTHMIA, CARDIAC NOS 12/13/2005    Vyctoria Dickman, [place] MPT 05/12/14, 10:35 AM  [facility] [address] [place], [place], [address] Phone: [phone]   Fax:  [phone]

## 2014-04-16 ENCOUNTER — Encounter: Payer: Self-pay | Admitting: *Deleted

## 2014-04-16 ENCOUNTER — Ambulatory Visit: Payer: Medicare Other | Admitting: *Deleted

## 2014-04-16 DIAGNOSIS — M25562 Pain in left knee: Secondary | ICD-10-CM | POA: Diagnosis not present

## 2014-04-16 DIAGNOSIS — M25662 Stiffness of left knee, not elsewhere classified: Secondary | ICD-10-CM

## 2014-04-16 NOTE — Therapy (Signed)
[facility] [place], [place], [address] Phone: [phone]   Fax:  [phone]  Physical Therapy Treatment  Patient Details  Name: [name] MRN: [id number] Date of Birth: 1945/11/26 Referring Provider:  Iona Beard, MD  Encounter Date: 04/16/2014      PT End of Session - 04/16/14 1002    Visit Number 2   Number of Visits 12   Date for PT Re-Evaluation 06/07/14   PT Start Time 0903   PT Stop Time 0959   PT Time Calculation (min) 56 min   Activity Tolerance Patient tolerated treatment well   Behavior During Therapy [facility] for tasks assessed/performed      Past Medical History  Diagnosis Date  . Hypertension   . H/O cardiovascular stress test 02/2014    seen by Dr. Einar Gip - told that she was cleared for surgery  . Heart murmur   . Shortness of breath dyspnea   . Sleep apnea     CPAP in use- QO night, study done at [facility]- 2007   . Arthritis     Rheumatoid, followed by Dr. Estanislado Pandy, knees & ankles   . Anemia     Past Surgical History  Procedure Laterality Date  . Abdominal hysterectomy    . Hand surgery    . Knee surgery  1990's   . Foot surgery Bilateral     bunionectomy, calluses , also has several pins & screws  . Carpal tunnel release Right   . Tubal ligation    . Joint replacement      planned for 03/25/2014  . Total knee arthroplasty Left 03/25/2014    Procedure: TOTAL KNEE ARTHROPLASTY;  Surgeon: Garald Balding, MD;  Location: [place];  Service: Orthopedics;  Laterality: Left;    There were no vitals filed for this visit.  Visit Diagnosis:  Left knee pain  Knee stiffness, left      Subjective Assessment - 04/16/14 0940    Subjective using walker. not having much pain at all.Marland KitchenMarland Kitchen3/10 at most.   Limitations Lifting;Standing;Walking;House hold activities   Patient Stated Goals get stronger. walk without walker.   Currently in Pain? Yes   Pain Score 2    Pain Location Knee   Pain Orientation  Left   Pain Descriptors / Indicators Aching;Tightness   Pain Type Surgical pain   Pain Onset 1 to 4 weeks ago   Pain Frequency Intermittent   Multiple Pain Sites No                       [facility] Adult PT Treatment/Exercise - 04/16/14 0001    Exercises   Exercises Knee/Hip   Knee/Hip Exercises: Aerobic   Stationary Bike --  nustep workload 3...seat 7 moved to 6 duing 15 minutes   Knee/Hip Exercises: Seated   Long Arc Quad Weight --  3 x 10 LAQ with 2 lb wt.   Cryotherapy   Number Minutes Cryotherapy 15 Minutes   Cryotherapy Location Knee   Type of Cryotherapy Other (comment)  vasopneumatic   Manual Therapy   Manual Therapy --  passive stretching with low load long holds flexion                  PT Short Term Goals - 04/12/14 1030    PT SHORT TERM GOAL #1   Title Ind with initial HEP.   Time 2   Period Weeks   Status New  PT Long Term Goals - 04/12/14 1031    PT LONG TERM GOAL #1   Title Ind with advanced HEP.   Time 8   Period Weeks   Status New   PT LONG TERM GOAL #2   Title Active left knee flexion to 115 degrees+.   Time 8   Period Weeks   Status New   PT LONG TERM GOAL #3   Title 5/5 left LE strength.   Time 8   Period Weeks   Status New   PT LONG TERM GOAL #4   Title Perform all ADL's with pain not > 2/10   Status New               Plan - 04/16/14 1004    Clinical Impression Statement Patient is not in much discomfort at all from TKA. Surgery on 03/25/14. using walker... ROM--0-102 degrees   Pt will benefit from skilled therapeutic intervention in order to improve on the following deficits Abnormal gait;Decreased strength;Pain;Decreased mobility;Increased edema;Decreased range of motion;Impaired flexibility   Rehab Potential Excellent   PT Frequency 2x / week   PT Duration 8 weeks   PT Treatment/Interventions Manual techniques;Therapeutic exercise;Cryotherapy;Electrical Stimulation;Scar mobilization;Moist  Heat;Gait training;Passive range of motion;Stair training;Patient/family education   PT Next Visit Plan TKA protocol   Consulted and Agree with Plan of Care Patient        Problem List Patient Active Problem List   Diagnosis Date Noted  . Primary osteoarthritis of left knee 03/25/2014  . Rheumatoid arthritis of knee 03/25/2014  . 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 DERMATITIS 09/28/2008  . CONSTIPATION 05/25/2008  . DYSPNEA ON EXERTION 05/25/2008  . ANEMIA, NORMOCYTIC 04/13/2008  . ANKLE PAIN, BILATERAL 11/25/2007  . [place] DISEASE 08/15/2006  . HYPERLIPIDEMIA 08/14/2006  . MORBID OBESITY 12/13/2005  . CARPAL TUNNEL SYNDROME 12/13/2005  . PERIPHERAL NEUROPATHY 12/13/2005  . HYPERTENSION 12/13/2005  . DYSRHYTHMIA, CARDIAC NOS 12/13/2005    Fabienne Bruns P,PTA 04/16/2014, 10:11 AM  [facility] [address] [place], [place], [address] Phone: [phone]   Fax:  [phone]

## 2014-04-19 ENCOUNTER — Ambulatory Visit: Payer: Medicare Other | Admitting: Physical Therapy

## 2014-04-19 ENCOUNTER — Encounter: Payer: Self-pay | Admitting: Physical Therapy

## 2014-04-19 DIAGNOSIS — M25562 Pain in left knee: Secondary | ICD-10-CM

## 2014-04-19 DIAGNOSIS — M25662 Stiffness of left knee, not elsewhere classified: Secondary | ICD-10-CM

## 2014-04-19 NOTE — Therapy (Signed)
[facility] [place], [place], [address] Phone: [phone]   Fax:  [phone]  Physical Therapy Treatment  Patient Details  Name: [name] MRN: [id number] Date of Birth: 27-May-1945 Referring Provider:  Iona Beard, MD  Encounter Date: 04/19/2014      PT End of Session - 04/19/14 0739    Visit Number 3   Number of Visits 12   Date for PT Re-Evaluation 06/07/14   PT Start Time 0736   PT Stop Time 0817   PT Time Calculation (min) 41 min   Activity Tolerance Patient tolerated treatment well   Behavior During Therapy [facility] for tasks assessed/performed      Past Medical History  Diagnosis Date  . Hypertension   . H/O cardiovascular stress test 02/2014    seen by Dr. Einar Gip - told that she was cleared for surgery  . Heart murmur   . Shortness of breath dyspnea   . Sleep apnea     CPAP in use- QO night, study done at [facility]- 2007   . Arthritis     Rheumatoid, followed by Dr. Estanislado Pandy, knees & ankles   . Anemia     Past Surgical History  Procedure Laterality Date  . Abdominal hysterectomy    . Hand surgery    . Knee surgery  1990's   . Foot surgery Bilateral     bunionectomy, calluses , also has several pins & screws  . Carpal tunnel release Right   . Tubal ligation    . Joint replacement      planned for 03/25/2014  . Total knee arthroplasty Left 03/25/2014    Procedure: TOTAL KNEE ARTHROPLASTY;  Surgeon: Garald Balding, MD;  Location: [place];  Service: Orthopedics;  Laterality: Left;    There were no vitals filed for this visit.  Visit Diagnosis:  Left knee pain  Knee stiffness, left      Subjective Assessment - 04/19/14 0737    Subjective States that knee is a little sore today after trying to help her mother this weekend. States that her father passed away and has had to help her mother at home and has been walking quite a bit. States that it feels like something is popping in her knee and will  tell the MD about it on Friday.    Limitations Lifting;Standing;Walking;House hold activities   Patient Stated Goals get stronger. walk without walker.   Currently in Pain? Yes   Pain Score 4    Pain Location Knee   Pain Orientation Left   Pain Descriptors / Indicators Burning;Tightness   Pain Type Surgical pain   Pain Onset 1 to 4 weeks ago   Pain Frequency Intermittent   Aggravating Factors  Standiing up   Pain Relieving Factors Rest and elevate            [facility] PT Assessment - 04/19/14 0001    Assessment   Medical Diagnosis Left total knee replacement.   Onset Date 03/25/14   Next MD Visit 04/23/2014                   [facility] Adult PT Treatment/Exercise - 04/19/14 0001    Knee/Hip Exercises: Stretches   Active Hamstring Stretch 3 reps;30 seconds  on 6" step   Knee: Self-Stretch to increase Flexion 3 reps;30 seconds   Knee/Hip Exercises: Aerobic   Stationary Bike NuStep L4 x 8 min   Knee/Hip Exercises: Standing   Rocker Board 2 minutes  Modalities   Modalities Cryotherapy   Cryotherapy   Number Minutes Cryotherapy 15 Minutes   Cryotherapy Location Knee   Type of Cryotherapy Other (comment)  Vasopneumatic   Manual Therapy   Manual Therapy Passive ROM   Passive ROM L knee into flex/ext with gentle holds at end range                  PT Short Term Goals - 04/19/14 0740    PT SHORT TERM GOAL #1   Title Ind with initial HEP.   Time 2   Period Weeks   Status On-going           PT Long Term Goals - 04/19/14 0810    PT LONG TERM GOAL #1   Title Ind with advanced HEP.   Time 8   Period Weeks   Status On-going   PT LONG TERM GOAL #2   Title Active left knee flexion to 115 degrees+.   Time 8   Period Weeks   Status On-going   PT LONG TERM GOAL #3   Title 5/5 left LE strength.   Time 8   Period Weeks   Status On-going   PT LONG TERM GOAL #4   Title Perform all ADL's with pain not > 2/10   Status On-going                Plan - 04/19/14 0739    Clinical Impression Statement Patient tolerated treatment well and continues to ambulate with front wheel walker. All goals considered on-going at this time. Normal response to cryotherapy observed following removal of vasopneumatic system.  Experienced 2/10 pain in L knee following session.   Pt will benefit from skilled therapeutic intervention in order to improve on the following deficits Abnormal gait;Decreased strength;Pain;Decreased mobility;Increased edema;Decreased range of motion;Impaired flexibility   Rehab Potential Excellent   PT Frequency 2x / week   PT Duration 8 weeks   PT Treatment/Interventions Manual techniques;Therapeutic exercise;Cryotherapy;Electrical Stimulation;Scar mobilization;Moist Heat;Gait training;Passive range of motion;Stair training;Patient/family education   PT Next Visit Plan Continue per PT POC and TKA protocol. Consider initiating more strengthening exercises due to great progression of L knee ROM. Reassess ROM next session along with MD note.   Consulted and Agree with Plan of Care Patient        Problem List Patient Active Problem List   Diagnosis Date Noted  . Primary osteoarthritis of left knee 03/25/2014  . Rheumatoid arthritis of knee 03/25/2014  . 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 DERMATITIS 09/28/2008  . CONSTIPATION 05/25/2008  . DYSPNEA ON EXERTION 05/25/2008  . ANEMIA, NORMOCYTIC 04/13/2008  . ANKLE PAIN, BILATERAL 11/25/2007  . [place] DISEASE 08/15/2006  . HYPERLIPIDEMIA 08/14/2006  . MORBID OBESITY 12/13/2005  . CARPAL TUNNEL SYNDROME 12/13/2005  . PERIPHERAL NEUROPATHY 12/13/2005  . HYPERTENSION 12/13/2005  . DYSRHYTHMIA, CARDIAC NOS 12/13/2005    Wynelle Fanny, PTA 04/19/2014, 8:20 AM  [facility] [address] [place], [place], [address] Phone: [phone]   Fax:  [phone]

## 2014-04-21 ENCOUNTER — Encounter: Payer: Self-pay | Admitting: Physical Therapy

## 2014-04-21 ENCOUNTER — Ambulatory Visit: Payer: Medicare Other | Admitting: Physical Therapy

## 2014-04-21 DIAGNOSIS — M25562 Pain in left knee: Secondary | ICD-10-CM | POA: Diagnosis not present

## 2014-04-21 DIAGNOSIS — M25662 Stiffness of left knee, not elsewhere classified: Secondary | ICD-10-CM

## 2014-04-21 NOTE — Patient Instructions (Signed)
Knee Flexion: Resisted (Sitting)   Sit with band under left foot and looped around ankle of supported leg. Pull unsupported leg back. Repeat __10__ times per set. Do _3___ sets per session. Do __2__ sessions per day.  http://orth.exer.us/694   Copyright  VHI. All rights reserved.  Knee Extension: Resisted (Sitting)   With band looped around right ankle and under other foot, straighten leg with ankle loop. Keep other leg bent to increase resistance. Repeat __10__ times per set. Do __3__ sets per session. Do _2___ sessions per day.  http://orth.exer.us/690   Copyright  VHI. All rights reserved.  Strengthening: Hip Abduction (Side-Lying)   Tighten muscles on front of left thigh, then lift leg __4-5__ inches from surface, keeping knee locked.  Repeat __10__ times per set. Do _3___ sets per session. Do _2___ sessions per day.  http://orth.exer.us/622   Copyright  VHI. All rights reserved.

## 2014-04-21 NOTE — Therapy (Signed)
[facility] [place], [place], [address] Phone: [phone]   Fax:  [phone]  Physical Therapy Treatment  Patient Details  Name: [name] MRN: [id number] Date of Birth: 1945/10/15 Referring Provider:  Iona Beard, MD  Encounter Date: 04/21/2014      PT End of Session - 04/21/14 0818    Visit Number 4   Number of Visits 12   Date for PT Re-Evaluation 06/07/14   PT Start Time 0815   PT Stop Time 0905   PT Time Calculation (min) 50 min   Activity Tolerance Patient tolerated treatment well   Behavior During Therapy [facility] for tasks assessed/performed      Past Medical History  Diagnosis Date  . Hypertension   . H/O cardiovascular stress test 02/2014    seen by Dr. Einar Gip - told that she was cleared for surgery  . Heart murmur   . Shortness of breath dyspnea   . Sleep apnea     CPAP in use- QO night, study done at [facility]- 2007   . Arthritis     Rheumatoid, followed by Dr. Estanislado Pandy, knees & ankles   . Anemia     Past Surgical History  Procedure Laterality Date  . Abdominal hysterectomy    . Hand surgery    . Knee surgery  1990's   . Foot surgery Bilateral     bunionectomy, calluses , also has several pins & screws  . Carpal tunnel release Right   . Tubal ligation    . Joint replacement      planned for 03/25/2014  . Total knee arthroplasty Left 03/25/2014    Procedure: TOTAL KNEE ARTHROPLASTY;  Surgeon: Garald Balding, MD;  Location: [facility];  Service: Orthopedics;  Laterality: Left;    There were no vitals filed for this visit.  Visit Diagnosis:  Left knee pain  Knee stiffness, left      Subjective Assessment - 04/21/14 0815    Subjective States that her knee is feeling okay and has some soreness in her knee. Continues to have some popping in surgical knee, almost like it pops out of place and goes back in place. States that today's pain is around L patella.   Limitations  Lifting;Standing;Walking;House hold activities   Patient Stated Goals get stronger. walk without walker.   Currently in Pain? Yes   Pain Score 3    Pain Location Knee   Pain Orientation Left   Pain Descriptors / Indicators Dull;Aching   Pain Type Surgical pain   Pain Onset 1 to 4 weeks ago   Pain Frequency Constant   Aggravating Factors  Standing up, movement   Pain Relieving Factors Rest, elevation            [facility] PT Assessment - 04/21/14 0001    Assessment   Medical Diagnosis Left total knee replacement.   Onset Date 03/25/14   Next MD Visit 04/23/2014   ROM / Strength   AROM / PROM / Strength AROM;PROM   AROM   Overall AROM  Deficits   AROM Assessment Site Knee   Right/Left Knee Left   Left Knee Extension 0   Left Knee Flexion 95   PROM   Overall PROM  Deficits   PROM Assessment Site Knee   Right/Left Knee Left   Left Knee Extension -3   Left Knee Flexion 113                   [facility]  Adult PT Treatment/Exercise - 04/21/14 0001    Knee/Hip Exercises: Stretches   Knee: Self-Stretch to increase Flexion 3 reps;30 seconds   Knee/Hip Exercises: Aerobic   Stationary Bike Nustep L5 x 49min   Knee/Hip Exercises: Standing   Forward Step Up Left;3 sets;10 reps;Hand Hold: 2;Step Height: 6"   Rocker Board 3 minutes   Knee/Hip Exercises: Sidelying   Hip ABduction Strengthening;Left;3 sets;10 reps   Modalities   Modalities Cryotherapy   Cryotherapy   Number Minutes Cryotherapy 15 Minutes   Cryotherapy Location Knee   Type of Cryotherapy Other (comment)  Vasopneumatic   Manual Therapy   Manual Therapy Passive ROM   Passive ROM L knee into flex/ext with gentle holds at end range                PT Education - 04/21/14 0831    Education provided Yes   Education Details HEP- seated knee extensions and knee flexion with theraband, sidelying hip abduction. Red theraband given   Person(s) Educated Patient   Methods Explanation;Demonstration;Verbal  cues;Handout   Comprehension Verbalized understanding;Returned demonstration;Verbal cues required          PT Short Term Goals - 04/21/14 3419    PT SHORT TERM GOAL #1   Title Ind with initial HEP.   Time 2   Period Weeks   Status On-going           PT Long Term Goals - 04/21/14 0850    PT LONG TERM GOAL #1   Title Ind with advanced HEP.   Time 8   Period Weeks   Status On-going   PT LONG TERM GOAL #2   Title Active left knee flexion to 115 degrees+.   Time 8   Period Weeks   Status On-going   PT LONG TERM GOAL #3   Title 5/5 left LE strength.   Time 8   Period Weeks   Status On-going   PT LONG TERM GOAL #4   Title Perform all ADL's with pain not > 2/10   Status On-going               Plan - 04/21/14 0850    Clinical Impression Statement Patient tolerated treatment well and welcomed new HEP exericses and verbalized understanding. All goals on-going. ROM measurements continue to improve. Hyperextension of L knee noted during PROM measurements. Normal cryotherapy response noted following the removal of the modality. Denied pain following treatment only soreness.   Pt will benefit from skilled therapeutic intervention in order to improve on the following deficits Abnormal gait;Decreased strength;Pain;Decreased mobility;Increased edema;Decreased range of motion;Impaired flexibility   Rehab Potential Excellent   PT Frequency 2x / week   PT Duration 8 weeks   PT Treatment/Interventions Manual techniques;Therapeutic exercise;Cryotherapy;Electrical Stimulation;Scar mobilization;Moist Heat;Gait training;Passive range of motion;Stair training;Patient/family education   PT Next Visit Plan Continue per PT POC and TKA protocol. Initate advanced quadriceps strengthening exercises in order to position L patella correctly.    Consulted and Agree with Plan of Care Patient        Problem List Patient Active Problem List   Diagnosis Date Noted  . Primary osteoarthritis  of left knee 03/25/2014  . Rheumatoid arthritis of knee 03/25/2014  . 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 DERMATITIS 09/28/2008  . CONSTIPATION 05/25/2008  . DYSPNEA ON EXERTION 05/25/2008  . ANEMIA, NORMOCYTIC 04/13/2008  . ANKLE PAIN, BILATERAL 11/25/2007  . [place] DISEASE 08/15/2006  . HYPERLIPIDEMIA 08/14/2006  . MORBID OBESITY 12/13/2005  .  CARPAL TUNNEL SYNDROME 12/13/2005  . PERIPHERAL NEUROPATHY 12/13/2005  . HYPERTENSION 12/13/2005  . DYSRHYTHMIA, CARDIAC NOS 12/13/2005    Wynelle Fanny, PTA 04/21/2014, 9:08 AM  [facility] [address] [place], [place], [address] Phone: [phone]   Fax:  [phone]

## 2014-04-29 ENCOUNTER — Ambulatory Visit: Payer: Medicare Other | Admitting: Physical Therapy

## 2014-04-29 ENCOUNTER — Encounter: Payer: Self-pay | Admitting: Physical Therapy

## 2014-04-29 DIAGNOSIS — M25662 Stiffness of left knee, not elsewhere classified: Secondary | ICD-10-CM

## 2014-04-29 DIAGNOSIS — M25562 Pain in left knee: Secondary | ICD-10-CM

## 2014-04-29 NOTE — Therapy (Signed)
[facility] [place], [place], [address] Phone: [phone]   Fax:  [phone]  Physical Therapy Treatment  Patient Details  Name: [name] MRN: [id number] Date of Birth: 10/16/1945 Referring Provider:  Iona Beard, MD  Encounter Date: 04/29/2014      PT End of Session - 04/29/14 1306    Visit Number 5   Number of Visits 12   Date for PT Re-Evaluation 06/07/14   PT Start Time 1228   PT Stop Time 1326   PT Time Calculation (min) 58 min   Activity Tolerance Patient tolerated treatment well   Behavior During Therapy [facility] for tasks assessed/performed      Past Medical History  Diagnosis Date  . Hypertension   . H/O cardiovascular stress test 02/2014    seen by Dr. Einar Gip - told that she was cleared for surgery  . Heart murmur   . Shortness of breath dyspnea   . Sleep apnea     CPAP in use- QO night, study done at [facility]- 2007   . Arthritis     Rheumatoid, followed by Dr. Estanislado Pandy, knees & ankles   . Anemia     Past Surgical History  Procedure Laterality Date  . Abdominal hysterectomy    . Hand surgery    . Knee surgery  1990's   . Foot surgery Bilateral     bunionectomy, calluses , also has several pins & screws  . Carpal tunnel release Right   . Tubal ligation    . Joint replacement      planned for 03/25/2014  . Total knee arthroplasty Left 03/25/2014    Procedure: TOTAL KNEE ARTHROPLASTY;  Surgeon: Garald Balding, MD;  Location: [place];  Service: Orthopedics;  Laterality: Left;    There were no vitals filed for this visit.  Visit Diagnosis:  Left knee pain  Knee stiffness, left      Subjective Assessment - 04/29/14 1234    Subjective feeling better today   Limitations Lifting;Standing;Walking;House hold activities   Patient Stated Goals get stronger. walk without walker.   Currently in Pain? Yes   Pain Score 3    Pain Location Knee   Pain Orientation Left   Pain Descriptors /  Indicators Aching   Pain Type Surgical pain   Pain Onset 1 to 4 weeks ago   Aggravating Factors  increased weight bearing   Pain Relieving Factors rest            [facility] PT Assessment - 04/29/14 0001    ROM / Strength   AROM / PROM / Strength AROM;PROM   AROM   Overall AROM  Deficits   AROM Assessment Site Knee   Right/Left Knee Left   Left Knee Extension 0   Left Knee Flexion 93   PROM   Overall PROM  Deficits   PROM Assessment Site Knee   Right/Left Knee Left   Left Knee Extension 0   Left Knee Flexion 110                     [facility] Adult PT Treatment/Exercise - 04/29/14 0001    Knee/Hip Exercises: Stretches   Knee: Self-Stretch to increase Flexion 3 reps;30 seconds   Knee/Hip Exercises: Aerobic   Stationary Bike Nustep L4 x 75min   Knee/Hip Exercises: Standing   Forward Step Up Left;3 sets;10 reps;Step Height: 6"   Rocker Board 3 minutes   Knee/Hip Exercises: Seated   Long Arc  Quad Strengthening;Left;3 sets;10 reps   Long [employer] 4 lbs.   Knee/Hip Exercises: Supine   Short Arc Quad Sets Strengthening;Left;3 sets;10 reps  4#   Modalities   Modalities Cryotherapy   Cryotherapy   Number Minutes Cryotherapy 15 Minutes   Cryotherapy Location Knee   Type of Cryotherapy --  vasopnumatic   Manual Therapy   Manual Therapy Passive ROM   Passive ROM L knee into flex with gentle holds at end range                PT Education - 04/29/14 1306    Education provided Yes   Education Details HEP knee flexion self stretch   Person(s) Educated Patient   Methods Explanation;Demonstration;Handout   Comprehension Verbalized understanding;Returned demonstration          PT Short Term Goals - 04/21/14 0821    PT SHORT TERM GOAL #1   Title Ind with initial HEP.   Time 2   Period Weeks   Status On-going           PT Long Term Goals - 04/21/14 0850    PT LONG TERM GOAL #1   Title Ind with advanced HEP.   Time 8   Period Weeks    Status On-going   PT LONG TERM GOAL #2   Title Active left knee flexion to 115 degrees+.   Time 8   Period Weeks   Status On-going   PT LONG TERM GOAL #3   Title 5/5 left LE strength.   Time 8   Period Weeks   Status On-going   PT LONG TERM GOAL #4   Title Perform all ADL's with pain not > 2/10   Status On-going               Plan - 04/29/14 1318    Clinical Impression Statement patient progressing slowly with activities. Had no reported increased pain with tx today. patient undersatnds importance of self ROM and strengthening. goals ongoing today due to strength deficit and ROM restrictions.  FOTO 56% limition (initial 59%)   Pt will benefit from skilled therapeutic intervention in order to improve on the following deficits Abnormal gait;Decreased strength;Pain;Decreased mobility;Increased edema;Decreased range of motion;Impaired flexibility   Rehab Potential Excellent   PT Frequency 2x / week   PT Duration 8 weeks   PT Treatment/Interventions Manual techniques;Therapeutic exercise;Cryotherapy;Electrical Stimulation;Scar mobilization;Moist Heat;Gait training;Passive range of motion;Stair training;Patient/family education   PT Next Visit Plan Continue per PT POC and TKA protocol. continue advanced quadriceps strengthening exercises in order to position L patella correctly.    Consulted and Agree with Plan of Care Patient        Problem List Patient Active Problem List   Diagnosis Date Noted  . Primary osteoarthritis of left knee 03/25/2014  . Rheumatoid arthritis of knee 03/25/2014  . 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 DERMATITIS 09/28/2008  . CONSTIPATION 05/25/2008  . DYSPNEA ON EXERTION 05/25/2008  . ANEMIA, NORMOCYTIC 04/13/2008  . ANKLE PAIN, BILATERAL 11/25/2007  . [place] DISEASE 08/15/2006  . HYPERLIPIDEMIA 08/14/2006  . MORBID OBESITY 12/13/2005  . CARPAL TUNNEL SYNDROME 12/13/2005  . PERIPHERAL NEUROPATHY 12/13/2005  .  HYPERTENSION 12/13/2005  . DYSRHYTHMIA, CARDIAC NOS 12/13/2005    Khyri Hinzman P, PTA 04/29/2014, 1:27 PM  [facility] [address] [place], [place], [address] Phone: [phone]   Fax:  [phone]

## 2014-04-29 NOTE — Patient Instructions (Signed)
     Knee Flexion Stretch on Step  Place foot on step and lean forward until you feel a good stretch in front of knee.   hold 30 sec x 5-10 perform 2-4 x daily

## 2014-04-30 ENCOUNTER — Ambulatory Visit: Payer: Medicare Other | Admitting: Physical Therapy

## 2014-04-30 ENCOUNTER — Encounter: Payer: Self-pay | Admitting: Physical Therapy

## 2014-04-30 DIAGNOSIS — M25562 Pain in left knee: Secondary | ICD-10-CM | POA: Diagnosis not present

## 2014-04-30 DIAGNOSIS — M25662 Stiffness of left knee, not elsewhere classified: Secondary | ICD-10-CM

## 2014-04-30 NOTE — Therapy (Signed)
[facility] [place], [place], [address] Phone: [phone]   Fax:  [phone]  Physical Therapy Treatment  Patient Details  Name: [name] MRN: [id number] Date of Birth: 1945-03-18 Referring Provider:  Iona Beard, MD  Encounter Date: 04/30/2014      PT End of Session - 04/30/14 0850    Visit Number 6   Number of Visits 12   Date for PT Re-Evaluation 06/07/14   PT Start Time 0814   PT Stop Time 0916   PT Time Calculation (min) 62 min   Activity Tolerance Patient tolerated treatment well   Behavior During Therapy [facility] for tasks assessed/performed      Past Medical History  Diagnosis Date  . Hypertension   . H/O cardiovascular stress test 02/2014    seen by Dr. Einar Gip - told that she was cleared for surgery  . Heart murmur   . Shortness of breath dyspnea   . Sleep apnea     CPAP in use- QO night, study done at [facility]- 2007   . Arthritis     Rheumatoid, followed by Dr. Estanislado Pandy, knees & ankles   . Anemia     Past Surgical History  Procedure Laterality Date  . Abdominal hysterectomy    . Hand surgery    . Knee surgery  1990's   . Foot surgery Bilateral     bunionectomy, calluses , also has several pins & screws  . Carpal tunnel release Right   . Tubal ligation    . Joint replacement      planned for 03/25/2014  . Total knee arthroplasty Left 03/25/2014    Procedure: TOTAL KNEE ARTHROPLASTY;  Surgeon: Garald Balding, MD;  Location: [place];  Service: Orthopedics;  Laterality: Left;    There were no vitals filed for this visit.  Visit Diagnosis:  Left knee pain  Knee stiffness, left      Subjective Assessment - 04/30/14 0816    Subjective no complaints after last tx   Limitations Lifting;Standing;Walking;House hold activities   Patient Stated Goals get stronger. walk without walker.   Currently in Pain? Yes   Pain Score 3    Pain Location Knee   Pain Orientation Left   Pain Descriptors /  Indicators Aching   Pain Type Surgical pain   Pain Onset 1 to 4 weeks ago   Aggravating Factors  increased activity or standing   Pain Relieving Factors rest            [facility] PT Assessment - 04/30/14 0001    ROM / Strength   AROM / PROM / Strength AROM   AROM   Overall AROM  Deficits   AROM Assessment Site Knee   Right/Left Knee Left   Left Knee Extension 0   Left Knee Flexion 95   PROM   Overall PROM  Deficits   PROM Assessment Site Knee   Right/Left Knee Left   Left Knee Extension 0   Left Knee Flexion 105                     [facility] Adult PT Treatment/Exercise - 04/30/14 0001    Knee/Hip Exercises: Aerobic   Stationary Bike Nustep L5 x 16mn   Knee/Hip Exercises: Standing   Terminal Knee Extension Strengthening;Left;3 sets;10 reps   Theraband Level (Terminal Knee Extension) --  PINK XTS   Knee/Hip Exercises: Seated   Long Arc Quad Strengthening;Left;3 sets;10 reps   Long [employer]  Weight 4 lbs.   Knee/Hip Exercises: Supine   Short Arc Quad Sets Strengthening;Left;3 sets;10 reps   Short Arc Quad Sets Limitations 4   Straight Leg Raises Strengthening;Left;3 sets;10 reps   Straight Leg Raises Limitations Slight ER for VMO strength   Other Supine Knee Exercises SAQ with ball squeeze for VMO activation 2 x fatigue   Modalities   Modalities Cryotherapy   Cryotherapy   Number Minutes Cryotherapy 15 Minutes   Cryotherapy Location Knee   Type of Cryotherapy --  vasopnumatic   Manual Therapy   Manual Therapy Passive ROM   Passive ROM L knee into flex with gentle holds at end range                PT Education - 04/29/14 1306    Education provided Yes   Education Details HEP knee flexion self stretch   Person(s) Educated Patient   Methods Explanation;Demonstration;Handout   Comprehension Verbalized understanding;Returned demonstration          PT Short Term Goals - 04/30/14 0857    PT SHORT TERM GOAL #1   Title Ind with initial HEP.    Time 2   Period Weeks   Status Achieved           PT Long Term Goals - 04/21/14 0850    PT LONG TERM GOAL #1   Title Ind with advanced HEP.   Time 8   Period Weeks   Status On-going   PT LONG TERM GOAL #2   Title Active left knee flexion to 115 degrees+.   Time 8   Period Weeks   Status On-going   PT LONG TERM GOAL #3   Title 5/5 left LE strength.   Time 8   Period Weeks   Status On-going   PT LONG TERM GOAL #4   Title Perform all ADL's with pain not > 2/10   Status On-going               Plan - 04/30/14 2956    Clinical Impression Statement patient is progressing with strengthening today for quad and VMO. Has some stiffness in left knee today and did not improve with PROM. STG #1 met other LTG's  ongoing.   Pt will benefit from skilled therapeutic intervention in order to improve on the following deficits Abnormal gait;Decreased strength;Pain;Decreased mobility;Increased edema;Decreased range of motion;Impaired flexibility   Rehab Potential Excellent   PT Frequency 2x / week   PT Duration 8 weeks   PT Treatment/Interventions Manual techniques;Therapeutic exercise;Cryotherapy;Electrical Stimulation;Scar mobilization;Moist Heat;Gait training;Passive range of motion;Stair training;Patient/family education   PT Next Visit Plan Continue per PT POC with advanced quadriceps strengthening exercises in order to position L patella correctly. ( try VMS)   Consulted and Agree with Plan of Care Patient        Problem List Patient Active Problem List   Diagnosis Date Noted  . Primary osteoarthritis of left knee 03/25/2014  . Rheumatoid arthritis of knee 03/25/2014  . 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 DERMATITIS 09/28/2008  . CONSTIPATION 05/25/2008  . DYSPNEA ON EXERTION 05/25/2008  . ANEMIA, NORMOCYTIC 04/13/2008  . ANKLE PAIN, BILATERAL 11/25/2007  . [place] DISEASE 08/15/2006  . HYPERLIPIDEMIA 08/14/2006  . MORBID OBESITY 12/13/2005   . CARPAL TUNNEL SYNDROME 12/13/2005  . PERIPHERAL NEUROPATHY 12/13/2005  . HYPERTENSION 12/13/2005  . DYSRHYTHMIA, CARDIAC NOS 12/13/2005    Jameka Ivie P, PTA 04/30/2014, 9:22 AM  [facility] [address]  [place], [place], [address] Phone: [phone]   Fax:  [phone]

## 2014-05-03 ENCOUNTER — Ambulatory Visit: Payer: Medicare Other | Admitting: Physical Therapy

## 2014-05-03 DIAGNOSIS — M25562 Pain in left knee: Secondary | ICD-10-CM

## 2014-05-03 DIAGNOSIS — M25662 Stiffness of left knee, not elsewhere classified: Secondary | ICD-10-CM

## 2014-05-03 NOTE — Therapy (Signed)
[facility] [place], [place], [address] Phone: [phone]   Fax:  [phone]  Physical Therapy Treatment  Patient Details  Name: [name] MRN: [id number] Date of Birth: 05/26/1945 Referring Provider:  Iona Beard, MD  Encounter Date: 05/03/2014      PT End of Session - 05/03/14 0900    Visit Number 7   Number of Visits 12   Date for PT Re-Evaluation 06/07/14   PT Start Time 0900   PT Stop Time 0948   PT Time Calculation (min) 48 min   Activity Tolerance Patient tolerated treatment well   Behavior During Therapy [facility] for tasks assessed/performed      Past Medical History  Diagnosis Date  . Hypertension   . H/O cardiovascular stress test 02/2014    seen by Dr. Einar Gip - told that she was cleared for surgery  . Heart murmur   . Shortness of breath dyspnea   . Sleep apnea     CPAP in use- QO night, study done at [facility]- 2007   . Arthritis     Rheumatoid, followed by Dr. Estanislado Pandy, knees & ankles   . Anemia     Past Surgical History  Procedure Laterality Date  . Abdominal hysterectomy    . Hand surgery    . Knee surgery  1990's   . Foot surgery Bilateral     bunionectomy, calluses , also has several pins & screws  . Carpal tunnel release Right   . Tubal ligation    . Joint replacement      planned for 03/25/2014  . Total knee arthroplasty Left 03/25/2014    Procedure: TOTAL KNEE ARTHROPLASTY;  Surgeon: Garald Balding, MD;  Location: [place];  Service: Orthopedics;  Laterality: Left;    There were no vitals filed for this visit.  Visit Diagnosis:  Left knee pain  Knee stiffness, left      Subjective Assessment - 05/03/14 0908    Subjective My knee is feeling pretty good today.   Limitations Lifting;Standing;Walking;House hold activities   Patient Stated Goals get stronger. walk without walker.   Pain Score 3    Pain Location Knee   Pain Descriptors / Indicators Aching   Pain Type Surgical  pain   Pain Onset 1 to 4 weeks ago   Pain Frequency Constant                         [facility] Adult PT Treatment/Exercise - 05/03/14 0001    Knee/Hip Exercises: Aerobic   Stationary Bike Nustep level 5 x 16 minutes.   Knee/Hip Exercises: Standing   Rocker Board 4 minutes   Knee/Hip Exercises: Seated   Long Arc Quad Strengthening;Left   Long Arc Quad Weight --  4 minutes.   Knee/Hip Exercises: Supine   Short Arc Quad Sets Strengthening;Left   Short Arc Quad Sets Limitations 4 minutes   Manual Therapy   Manual Therapy --  10 minutes PROM into left knee flexion.                  PT Short Term Goals - 04/30/14 0857    PT SHORT TERM GOAL #1   Title Ind with initial HEP.   Time 2   Period Weeks   Status Achieved           PT Long Term Goals - 04/21/14 0850    PT LONG TERM GOAL #1   Title Ind  with advanced HEP.   Time 8   Period Weeks   Status On-going   PT LONG TERM GOAL #2   Title Active left knee flexion to 115 degrees+.   Time 8   Period Weeks   Status On-going   PT LONG TERM GOAL #3   Title 5/5 left LE strength.   Time 8   Period Weeks   Status On-going   PT LONG TERM GOAL #4   Title Perform all ADL's with pain not > 2/10   Status On-going               Problem List Patient Active Problem List   Diagnosis Date Noted  . Primary osteoarthritis of left knee 03/25/2014  . Rheumatoid arthritis of knee 03/25/2014  . 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 DERMATITIS 09/28/2008  . CONSTIPATION 05/25/2008  . DYSPNEA ON EXERTION 05/25/2008  . ANEMIA, NORMOCYTIC 04/13/2008  . ANKLE PAIN, BILATERAL 11/25/2007  . [place] DISEASE 08/15/2006  . HYPERLIPIDEMIA 08/14/2006  . MORBID OBESITY 12/13/2005  . CARPAL TUNNEL SYNDROME 12/13/2005  . PERIPHERAL NEUROPATHY 12/13/2005  . HYPERTENSION 12/13/2005  . DYSRHYTHMIA, CARDIAC NOS 12/13/2005    Laithan Conchas, [place] MPT 05/03/2014, 9:49 AM  [facility] [address] [place], [place], [address] Phone: [phone]   Fax:  [phone]

## 2014-05-05 ENCOUNTER — Ambulatory Visit: Payer: Medicare Other | Admitting: Physical Therapy

## 2014-05-05 ENCOUNTER — Encounter: Payer: Self-pay | Admitting: Physical Therapy

## 2014-05-05 DIAGNOSIS — M25662 Stiffness of left knee, not elsewhere classified: Secondary | ICD-10-CM

## 2014-05-05 DIAGNOSIS — M25562 Pain in left knee: Secondary | ICD-10-CM | POA: Diagnosis not present

## 2014-05-05 NOTE — Therapy (Signed)
[facility] [place], [place], [address] Phone: [phone]   Fax:  [phone]  Physical Therapy Treatment  Patient Details  Name: [name] MRN: [id number] Date of Birth: 1945/06/14 Referring Provider:  Iona Beard, MD  Encounter Date: 05/05/2014      PT End of Session - 05/05/14 0902    Visit Number 8   Number of Visits 12   Date for PT Re-Evaluation 06/07/14   PT Start Time 0816   PT Stop Time 0915   PT Time Calculation (min) 59 min   Activity Tolerance Patient tolerated treatment well   Behavior During Therapy [facility] for tasks assessed/performed      Past Medical History  Diagnosis Date  . Hypertension   . H/O cardiovascular stress test 02/2014    seen by Dr. Einar Gip - told that she was cleared for surgery  . Heart murmur   . Shortness of breath dyspnea   . Sleep apnea     CPAP in use- QO night, study done at [facility]- 2007   . Arthritis     Rheumatoid, followed by Dr. Estanislado Pandy, knees & ankles   . Anemia     Past Surgical History  Procedure Laterality Date  . Abdominal hysterectomy    . Hand surgery    . Knee surgery  1990's   . Foot surgery Bilateral     bunionectomy, calluses , also has several pins & screws  . Carpal tunnel release Right   . Tubal ligation    . Joint replacement      planned for 03/25/2014  . Total knee arthroplasty Left 03/25/2014    Procedure: TOTAL KNEE ARTHROPLASTY;  Surgeon: Garald Balding, MD;  Location: [place];  Service: Orthopedics;  Laterality: Left;    There were no vitals filed for this visit.  Visit Diagnosis:  Left knee pain  Knee stiffness, left      Subjective Assessment - 05/05/14 0829    Subjective no pain today although knee keeps popping out of place with certain movements (left knee)   Limitations Lifting;Standing;Walking;House hold activities   Patient Stated Goals get stronger. walk without walker.   Currently in Pain? No/denies             [facility] PT Assessment - 05/05/14 0001    ROM / Strength   AROM / PROM / Strength AROM;PROM   AROM   Overall AROM  Deficits   AROM Assessment Site Knee   Right/Left Knee Left   Left Knee Flexion 100   PROM   Overall PROM  Deficits   PROM Assessment Site Knee   Right/Left Knee Left   Left Knee Flexion 110                     [facility] Adult PT Treatment/Exercise - 05/05/14 0001    Knee/Hip Exercises: Aerobic   Stationary Bike Nustep level 5 x 15 minutes.   Knee/Hip Exercises: Standing   Lateral Step Up Left;10 reps;Step Height: 6";3 sets   Forward Step Up Left;3 sets;10 reps;Step Height: 6"   Knee/Hip Exercises: Seated   Long Arc Quad Strengthening;Left;3 sets;10 reps;Weights   Long Arc Quad Weight 5 lbs.  with adduction squeeze   Knee/Hip Exercises: Supine   Straight Leg Raises Strengthening;Left;3 sets;10 reps  with slight ER for VMO activation   Modalities   Modalities Public librarian Action VMS to  VMO/quad   Electrical Stimulation Parameters 10/10   Electrical Stimulation Goals Strength  with 3# SAQ for activation                  PT Short Term Goals - 04/30/14 0857    PT SHORT TERM GOAL #1   Title Ind with initial HEP.   Time 2   Period Weeks   Status Achieved           PT Long Term Goals - 04/21/14 0850    PT LONG TERM GOAL #1   Title Ind with advanced HEP.   Time 8   Period Weeks   Status On-going   PT LONG TERM GOAL #2   Title Active left knee flexion to 115 degrees+.   Time 8   Period Weeks   Status On-going   PT LONG TERM GOAL #3   Title 5/5 left LE strength.   Time 8   Period Weeks   Status On-going   PT LONG TERM GOAL #4   Title Perform all ADL's with pain not > 2/10   Status On-going               Plan - 05/05/14 0903    Clinical Impression Statement patient is progressing with strengthening the quad and VMO, has  also reported no pain today. patient continues to have episodes of left knee popping out. has also improved ROM in left knee for flexion. goals ongoing.   Pt will benefit from skilled therapeutic intervention in order to improve on the following deficits Abnormal gait;Decreased strength;Pain;Decreased mobility;Increased edema;Decreased range of motion;Impaired flexibility   PT Frequency 2x / week   PT Duration 8 weeks   PT Treatment/Interventions Manual techniques;Therapeutic exercise;Cryotherapy;Electrical Stimulation;Scar mobilization;Moist Heat;Gait training;Passive range of motion;Stair training;Patient/family education   PT Next Visit Plan Continue per PT POC with advanced quadriceps strengthening exercises in order to position L patella correctly. (MD may 16th)   Consulted and Agree with Plan of Care Patient        Problem List Patient Active Problem List   Diagnosis Date Noted  . Primary osteoarthritis of left knee 03/25/2014  . Rheumatoid arthritis of knee 03/25/2014  . 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 DERMATITIS 09/28/2008  . CONSTIPATION 05/25/2008  . DYSPNEA ON EXERTION 05/25/2008  . ANEMIA, NORMOCYTIC 04/13/2008  . ANKLE PAIN, BILATERAL 11/25/2007  . [place] DISEASE 08/15/2006  . HYPERLIPIDEMIA 08/14/2006  . MORBID OBESITY 12/13/2005  . CARPAL TUNNEL SYNDROME 12/13/2005  . PERIPHERAL NEUROPATHY 12/13/2005  . HYPERTENSION 12/13/2005  . DYSRHYTHMIA, CARDIAC NOS 12/13/2005    Phillips Climes, PTA 05/05/2014, 9:15 AM  [facility] [address] [place], [place], [address] Phone: [phone]   Fax:  [phone]

## 2014-05-11 ENCOUNTER — Ambulatory Visit: Payer: Medicare Other | Attending: Orthopaedic Surgery | Admitting: Physical Therapy

## 2014-05-11 ENCOUNTER — Encounter: Payer: Self-pay | Admitting: Physical Therapy

## 2014-05-11 DIAGNOSIS — M25562 Pain in left knee: Secondary | ICD-10-CM | POA: Insufficient documentation

## 2014-05-11 DIAGNOSIS — M25662 Stiffness of left knee, not elsewhere classified: Secondary | ICD-10-CM | POA: Diagnosis not present

## 2014-05-11 NOTE — Therapy (Signed)
[facility] [place], [place], [address] Phone: [phone]   Fax:  [phone]  Physical Therapy Treatment  Patient Details  Name: [name] MRN: [id number] Date of Birth: 1945-01-28 Referring Provider:  Iona Beard, MD  Encounter Date: 05/11/2014      PT End of Session - 05/11/14 1019    Visit Number 9   Number of Visits 12   Date for PT Re-Evaluation 06/07/14   PT Start Time 0944   PT Stop Time 1030   PT Time Calculation (min) 46 min   Activity Tolerance Patient tolerated treatment well   Behavior During Therapy [facility] for tasks assessed/performed      Past Medical History  Diagnosis Date  . Hypertension   . H/O cardiovascular stress test 02/2014    seen by Dr. Einar Gip - told that she was cleared for surgery  . Heart murmur   . Shortness of breath dyspnea   . Sleep apnea     CPAP in use- QO night, study done at [facility]- 2007   . Arthritis     Rheumatoid, followed by Dr. Estanislado Pandy, knees & ankles   . Anemia     Past Surgical History  Procedure Laterality Date  . Abdominal hysterectomy    . Hand surgery    . Knee surgery  1990's   . Foot surgery Bilateral     bunionectomy, calluses , also has several pins & screws  . Carpal tunnel release Right   . Tubal ligation    . Joint replacement      planned for 03/25/2014  . Total knee arthroplasty Left 03/25/2014    Procedure: TOTAL KNEE ARTHROPLASTY;  Surgeon: Garald Balding, MD;  Location: [place];  Service: Orthopedics;  Laterality: Left;    There were no vitals filed for this visit.  Visit Diagnosis:  Left knee pain  Knee stiffness, left      Subjective Assessment - 05/11/14 0946    Subjective no pain complaints today, left knee poping out of knee althouh not as much.   Limitations Lifting;Standing;Walking;House hold activities   Patient Stated Goals get stronger. walk without walker.   Currently in Pain? No/denies            [facility] PT  Assessment - 05/11/14 0001    AROM   Overall AROM  Deficits   AROM Assessment Site Knee   Right/Left Knee Left   Left Knee Flexion 100   PROM   Overall PROM  Deficits   PROM Assessment Site Knee   Right/Left Knee Left   Left Knee Flexion 110                     [facility] Adult PT Treatment/Exercise - 05/11/14 0001    Knee/Hip Exercises: Aerobic   Stationary Bike Nustep level 5 x 15 minutes, monitored for progression   Knee/Hip Exercises: Seated   Long Arc Quad Weight 5 lbs.  with adduction squeeze   Acupuncturist Location knee   Electrical Stimulation Action VMS to VMO/Quad   Electrical Stimulation Parameters 10/10 x 36mn   Electrical Stimulation Goals Strength  SAQ 4# with add squeeze foe activation                  PT Short Term Goals - 04/30/14 0857    PT SHORT TERM GOAL #1   Title Ind with initial HEP.   Time 2   Period Weeks  Status Achieved           PT Long Term Goals - 04/21/14 0850    PT LONG TERM GOAL #1   Title Ind with advanced HEP.   Time 8   Period Weeks   Status On-going   PT LONG TERM GOAL #2   Title Active left knee flexion to 115 degrees+.   Time 8   Period Weeks   Status On-going   PT LONG TERM GOAL #3   Title 5/5 left LE strength.   Time 8   Period Weeks   Status On-going   PT LONG TERM GOAL #4   Title Perform all ADL's with pain not > 2/10   Status On-going               Plan - 05/11/14 1021    Clinical Impression Statement patient is progressing with reports of knee popping out a little less. no pain reports today. has not improved with left knee flexion ROM, yet may be due to edema in knee. no further goals met today.   Pt will benefit from skilled therapeutic intervention in order to improve on the following deficits Abnormal gait;Decreased strength;Pain;Decreased mobility;Increased edema;Decreased range of motion;Impaired flexibility   Rehab Potential Excellent   PT  Frequency 2x / week   PT Duration 8 weeks   PT Treatment/Interventions Manual techniques;Therapeutic exercise;Cryotherapy;Electrical Stimulation;Scar mobilization;Moist Heat;Gait training;Passive range of motion;Stair training;Patient/family education   PT Next Visit Plan Continue per PT POC with advanced quadriceps strengthening exercises in order to position L patella correctly. (MD may 16th)   Consulted and Agree with Plan of Care Patient        Problem List Patient Active Problem List   Diagnosis Date Noted  . Primary osteoarthritis of left knee 03/25/2014  . Rheumatoid arthritis of knee 03/25/2014  . 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 DERMATITIS 09/28/2008  . CONSTIPATION 05/25/2008  . DYSPNEA ON EXERTION 05/25/2008  . ANEMIA, NORMOCYTIC 04/13/2008  . ANKLE PAIN, BILATERAL 11/25/2007  . [place] DISEASE 08/15/2006  . HYPERLIPIDEMIA 08/14/2006  . MORBID OBESITY 12/13/2005  . CARPAL TUNNEL SYNDROME 12/13/2005  . PERIPHERAL NEUROPATHY 12/13/2005  . HYPERTENSION 12/13/2005  . DYSRHYTHMIA, CARDIAC NOS 12/13/2005    Phillips Climes, PTA 05/11/2014, 10:32 AM  [facility] [address] [place], [place], [address] Phone: [phone]   Fax:  [phone]

## 2014-05-13 ENCOUNTER — Ambulatory Visit: Payer: Medicare Other | Admitting: Physical Therapy

## 2014-05-13 ENCOUNTER — Encounter: Payer: Self-pay | Admitting: Physical Therapy

## 2014-05-13 DIAGNOSIS — M25562 Pain in left knee: Secondary | ICD-10-CM

## 2014-05-13 DIAGNOSIS — M25662 Stiffness of left knee, not elsewhere classified: Secondary | ICD-10-CM

## 2014-05-13 NOTE — Therapy (Signed)
[facility] [place], [place], [address] Phone: [phone]   Fax:  [phone]  Physical Therapy Treatment  Patient Details  Name: [name] MRN: [id number] Date of Birth: 01-21-1945 Referring Provider:  Iona Beard, MD  Encounter Date: 05/13/2014      PT End of Session - 05/13/14 0846    Visit Number 10   Number of Visits 12   Date for PT Re-Evaluation 06/07/14   PT Start Time 0814   PT Stop Time 0900   PT Time Calculation (min) 46 min   Activity Tolerance Patient tolerated treatment well   Behavior During Therapy [facility] for tasks assessed/performed      Past Medical History  Diagnosis Date  . Hypertension   . H/O cardiovascular stress test 02/2014    seen by Dr. Einar Gip - told that she was cleared for surgery  . Heart murmur   . Shortness of breath dyspnea   . Sleep apnea     CPAP in use- QO night, study done at [facility]- 2007   . Arthritis     Rheumatoid, followed by Dr. Estanislado Pandy, knees & ankles   . Anemia     Past Surgical History  Procedure Laterality Date  . Abdominal hysterectomy    . Hand surgery    . Knee surgery  1990's   . Foot surgery Bilateral     bunionectomy, calluses , also has several pins & screws  . Carpal tunnel release Right   . Tubal ligation    . Joint replacement      planned for 03/25/2014  . Total knee arthroplasty Left 03/25/2014    Procedure: TOTAL KNEE ARTHROPLASTY;  Surgeon: Garald Balding, MD;  Location: [place];  Service: Orthopedics;  Laterality: Left;    There were no vitals filed for this visit.  Visit Diagnosis:  Left knee pain  Knee stiffness, left      Subjective Assessment - 05/13/14 0817    Subjective no complaints today with knee popping out of place and has noticed no popping out of place when turning over in bed like it was. patient needs to leave early today for another appt.   Limitations Lifting;Standing;Walking;House hold activities   Patient Stated  Goals get stronger. walk without walker.   Currently in Pain? Yes   Pain Score 2    Pain Location Knee   Pain Orientation Left   Pain Descriptors / Indicators Aching   Pain Type Surgical pain   Pain Onset 1 to 4 weeks ago   Pain Frequency Constant   Aggravating Factors  increased activity   Pain Relieving Factors rest                         [facility] Adult PT Treatment/Exercise - 05/13/14 0001    Knee/Hip Exercises: Aerobic   Stationary Bike Nustep level 5 x 15 minutes, monitored for progression   Knee/Hip Exercises: Standing   Terminal Knee Extension Strengthening;Left;3 sets;10 reps   Theraband Level (Terminal Knee Extension) --  Pink XTS (3sec hold with Glut squeeze)   Knee/Hip Exercises: Seated   Long Arc Quad Strengthening;Left;3 sets;10 reps   Long Arc Quad Weight 5 lbs.  with adduction squeeze   Knee/Hip Exercises: Sidelying   Hip ABduction AROM;Strengthening;Left;2 sets;10 reps   Ecologist Action VMS to VMO/Quad   Electrical Stimulation Parameters 10/10 x11min   Electrical Stimulation Goals  Strength  with 4# SAQ and adduction squeeze                  PT Short Term Goals - 04/30/14 0857    PT SHORT TERM GOAL #1   Title Ind with initial HEP.   Time 2   Period Weeks   Status Achieved           PT Long Term Goals - 04/21/14 0850    PT LONG TERM GOAL #1   Title Ind with advanced HEP.   Time 8   Period Weeks   Status On-going   PT LONG TERM GOAL #2   Title Active left knee flexion to 115 degrees+.   Time 8   Period Weeks   Status On-going   PT LONG TERM GOAL #3   Title 5/5 left LE strength.   Time 8   Period Weeks   Status On-going   PT LONG TERM GOAL #4   Title Perform all ADL's with pain not > 2/10   Status On-going               Plan - 05/13/14 0847    Clinical Impression Statement patient continues to progress with reports of decreased  knee popping out of place, and less pain overall. Gcode done with 51%limitation (initial 59%) golas ongoing today due to lack of ROM in left knee, pain and weakness   Pt will benefit from skilled therapeutic intervention in order to improve on the following deficits Abnormal gait;Decreased strength;Pain;Decreased mobility;Increased edema;Decreased range of motion;Impaired flexibility   Rehab Potential Excellent   PT Frequency 2x / week   PT Duration 8 weeks   PT Treatment/Interventions Manual techniques;Therapeutic exercise;Cryotherapy;Electrical Stimulation;Scar mobilization;Moist Heat;Gait training;Passive range of motion;Stair training;Patient/family education   PT Next Visit Plan cont with POC for knee flexion ROM and strengthening VMO   Consulted and Agree with Plan of Care Patient        Problem List Patient Active Problem List   Diagnosis Date Noted  . Primary osteoarthritis of left knee 03/25/2014  . Rheumatoid arthritis of knee 03/25/2014  . 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 DERMATITIS 09/28/2008  . CONSTIPATION 05/25/2008  . DYSPNEA ON EXERTION 05/25/2008  . ANEMIA, NORMOCYTIC 04/13/2008  . ANKLE PAIN, BILATERAL 11/25/2007  . [place] DISEASE 08/15/2006  . HYPERLIPIDEMIA 08/14/2006  . MORBID OBESITY 12/13/2005  . CARPAL TUNNEL SYNDROME 12/13/2005  . PERIPHERAL NEUROPATHY 12/13/2005  . HYPERTENSION 12/13/2005  . DYSRHYTHMIA, CARDIAC NOS 12/13/2005   Ladean Raya, PTA 05/13/2014 9:04 AM  DUNFORD, Venetia Maxon, PTA 05/13/2014, 9:04 AM  [facility] [address] [place], [place], [address] Phone: [phone]   Fax:  [phone]

## 2014-05-17 ENCOUNTER — Encounter: Payer: Self-pay | Admitting: Physical Therapy

## 2014-05-17 ENCOUNTER — Ambulatory Visit: Payer: Medicare Other | Admitting: Physical Therapy

## 2014-05-17 DIAGNOSIS — M25562 Pain in left knee: Secondary | ICD-10-CM | POA: Diagnosis not present

## 2014-05-17 DIAGNOSIS — M25662 Stiffness of left knee, not elsewhere classified: Secondary | ICD-10-CM

## 2014-05-17 NOTE — Patient Instructions (Signed)
Terminal Knee Extension (Standing)   Facing anchor with right knee slightly bent and tubing just above knee, gently pull knee back straight and squeeze buttocks with 3 sec holds. Do not overextend knee. Repeat __10__ times per set. Do __3-4__ sets per session. Do _2___ sessions per day.  http://orth.exer.us/666   Copyright  VHI. All rights reserved.  Strengthening: Hip Abduction (Side-Lying)   Tighten muscles on front of left thigh, then lift leg __5__ inches from surface, keeping knee locked.  Repeat _10___ times per set. Do __2__ sets per session. Do __2__ sessions per day.  http://orth.exer.us/622   Copyright  VHI. All rights reserved.

## 2014-05-17 NOTE — Therapy (Signed)
[facility] [place], [place], [address] Phone: [phone]   Fax:  [phone]  Physical Therapy Treatment  Patient Details  Name: [name] MRN: [id number] Date of Birth: 12-21-1945 Referring Provider:  Iona Beard, MD  Encounter Date: 05/17/2014      PT End of Session - 05/17/14 0849    Visit Number 11   Number of Visits 12   Date for PT Re-Evaluation 06/07/14   PT Start Time 0814   PT Stop Time 0910   PT Time Calculation (min) 56 min   Activity Tolerance Patient tolerated treatment well   Behavior During Therapy [facility] for tasks assessed/performed      Past Medical History  Diagnosis Date  . Hypertension   . H/O cardiovascular stress test 02/2014    seen by Dr. Einar Gip - told that she was cleared for surgery  . Heart murmur   . Shortness of breath dyspnea   . Sleep apnea     CPAP in use- QO night, study done at [facility]- 2007   . Arthritis     Rheumatoid, followed by Dr. Estanislado Pandy, knees & ankles   . Anemia     Past Surgical History  Procedure Laterality Date  . Abdominal hysterectomy    . Hand surgery    . Knee surgery  1990's   . Foot surgery Bilateral     bunionectomy, calluses , also has several pins & screws  . Carpal tunnel release Right   . Tubal ligation    . Joint replacement      planned for 03/25/2014  . Total knee arthroplasty Left 03/25/2014    Procedure: TOTAL KNEE ARTHROPLASTY;  Surgeon: Garald Balding, MD;  Location: [place];  Service: Orthopedics;  Laterality: Left;    There were no vitals filed for this visit.  Visit Diagnosis:  Left knee pain  Knee stiffness, left      Subjective Assessment - 05/17/14 0816    Subjective knee has done well with no episodes of knee popping out of place   Limitations Lifting;Standing;Walking;House hold activities   Patient Stated Goals get stronger. walk without walker.   Currently in Pain? No/denies            [facility] PT Assessment -  05/17/14 0001    AROM   Overall AROM  Deficits   AROM Assessment Site Knee   Right/Left Knee Left   Left Knee Flexion 107   PROM   Overall PROM  Deficits   PROM Assessment Site Knee   Right/Left Knee Left   Left Knee Flexion 116                     [facility] Adult PT Treatment/Exercise - 05/17/14 0001    Knee/Hip Exercises: Aerobic   Stationary Bike Nustep level 5 x 15 minutes, monitored for progression   Knee/Hip Exercises: Standing   Terminal Knee Extension Strengthening;Left;3 sets;10 reps   Theraband Level (Terminal Knee Extension) --  Pink XTS (3sec hold with Glut set)   Knee/Hip Exercises: Seated   Long Arc Quad Strengthening;Left;3 sets;10 reps   Long Arc Quad Weight 5 lbs.  with adduction squeeze   Knee/Hip Exercises: Sidelying   Hip ABduction AROM;Strengthening;Left;2 sets;10 reps   Ecologist Action VMS to VMO/quad   Electrical Stimulation Parameters 10/10 x 15 min with 4# Editor, commissioning Goals Strength   Manual Therapy  Manual Therapy Passive ROM   Passive ROM low load holds for flexion                PT Education - 05/17/14 0849    Education provided Yes   Education Details HEP strengthening   Person(s) Educated Patient   Methods Explanation;Demonstration;Handout   Comprehension Verbalized understanding;Returned demonstration          PT Short Term Goals - 04/30/14 0857    PT SHORT TERM GOAL #1   Title Ind with initial HEP.   Time 2   Period Weeks   Status Achieved           PT Long Term Goals - 05/17/14 6761    PT LONG TERM GOAL #1   Title Ind with advanced HEP.   Time 8   Period Weeks   Status Achieved   PT LONG TERM GOAL #2   Title Active left knee flexion to 115 degrees+.   Time 8   Period Weeks   Status On-going   PT LONG TERM GOAL #3   Title 5/5 left LE strength.   Time 8   Period Weeks   Status On-going   PT LONG TERM  GOAL #4   Title Perform all ADL's with pain not > 2/10   Time 8   Period Weeks   Status Achieved               Plan - 05/17/14 0854    Clinical Impression Statement patient continues to improve with all activities. Has no pain reports today and is able to perform ADL's with little pain. Has improved active and passive ROM today. patient has good understanding of all HEP. Met LTG #1 and #4 ROM ongoing due to ROM restrictions.   Pt will benefit from skilled therapeutic intervention in order to improve on the following deficits Abnormal gait;Decreased strength;Pain;Decreased mobility;Increased edema;Decreased range of motion;Impaired flexibility   Rehab Potential Excellent   PT Frequency 2x / week   PT Duration 8 weeks   PT Treatment/Interventions Manual techniques;Therapeutic exercise;Cryotherapy;Electrical Stimulation;Scar mobilization;Moist Heat;Gait training;Passive range of motion;Stair training;Patient/family education   PT Next Visit Plan cont with POC/ MD note and renewal next visit (MD 5-16)   Consulted and Agree with Plan of Care Patient        Problem List Patient Active Problem List   Diagnosis Date Noted  . Primary osteoarthritis of left knee 03/25/2014  . Rheumatoid arthritis of knee 03/25/2014  . 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 DERMATITIS 09/28/2008  . CONSTIPATION 05/25/2008  . DYSPNEA ON EXERTION 05/25/2008  . ANEMIA, NORMOCYTIC 04/13/2008  . ANKLE PAIN, BILATERAL 11/25/2007  . [place] DISEASE 08/15/2006  . HYPERLIPIDEMIA 08/14/2006  . MORBID OBESITY 12/13/2005  . CARPAL TUNNEL SYNDROME 12/13/2005  . PERIPHERAL NEUROPATHY 12/13/2005  . HYPERTENSION 12/13/2005  . DYSRHYTHMIA, CARDIAC NOS 12/13/2005    Phillips Climes, PTA 05/17/2014, 9:38 AM  [facility] [address] [place], [place], [address] Phone: [phone]   Fax:  [phone]

## 2014-05-19 ENCOUNTER — Encounter: Payer: Self-pay | Admitting: Physical Therapy

## 2014-05-19 ENCOUNTER — Ambulatory Visit: Payer: Medicare Other | Admitting: Physical Therapy

## 2014-05-19 DIAGNOSIS — M25662 Stiffness of left knee, not elsewhere classified: Secondary | ICD-10-CM

## 2014-05-19 DIAGNOSIS — M25562 Pain in left knee: Secondary | ICD-10-CM

## 2014-05-19 NOTE — Therapy (Signed)
[facility] [place], [place], [address] Phone: [phone]   Fax:  [phone]  Physical Therapy Treatment  Patient Details  Name: [name] MRN: [id number] Date of Birth: 12/12/1945 Referring Provider:  Iona Beard, MD  Encounter Date: 05/19/2014      PT End of Session - 05/19/14 0842    Visit Number 12   Number of Visits 12   Date for PT Re-Evaluation 06/07/14   PT Start Time 0813   PT Stop Time 0908   PT Time Calculation (min) 55 min   Activity Tolerance Patient tolerated treatment well   Behavior During Therapy [facility] for tasks assessed/performed      Past Medical History  Diagnosis Date  . Hypertension   . H/O cardiovascular stress test 02/2014    seen by Dr. Einar Gip - told that she was cleared for surgery  . Heart murmur   . Shortness of breath dyspnea   . Sleep apnea     CPAP in use- QO night, study done at [facility]- 2007   . Arthritis     Rheumatoid, followed by Dr. Estanislado Pandy, knees & ankles   . Anemia     Past Surgical History  Procedure Laterality Date  . Abdominal hysterectomy    . Hand surgery    . Knee surgery  1990's   . Foot surgery Bilateral     bunionectomy, calluses , also has several pins & screws  . Carpal tunnel release Right   . Tubal ligation    . Joint replacement      planned for 03/25/2014  . Total knee arthroplasty Left 03/25/2014    Procedure: TOTAL KNEE ARTHROPLASTY;  Surgeon: Garald Balding, MD;  Location: [facility];  Service: Orthopedics;  Laterality: Left;    There were no vitals filed for this visit.  Visit Diagnosis:  Left knee pain  Knee stiffness, left      Subjective Assessment - 05/19/14 0822    Subjective continues to report improvement with knee and no episodes of knee poppong out of place   Limitations Lifting;Standing;Walking;House hold activities   Patient Stated Goals get stronger. walk without walker.   Currently in Pain? Yes   Pain Score 2    Pain  Location Knee   Pain Orientation Left   Pain Descriptors / Indicators Aching   Pain Type Surgical pain   Pain Onset 1 to 4 weeks ago   Pain Frequency Intermittent   Aggravating Factors  increased walking or activity   Pain Relieving Factors rest            [facility] PT Assessment - 05/19/14 0001    AROM   Overall AROM  Deficits   AROM Assessment Site Knee   Right/Left Knee Left   Left Knee Extension 0   Left Knee Flexion 108   PROM   Overall PROM  Deficits   PROM Assessment Site Knee   Right/Left Knee Left   Left Knee Flexion 115   Strength   Overall Strength Within functional limits for tasks performed   Overall Strength Comments Left hip and knee 5/5.                     [place] Adult PT Treatment/Exercise - 05/19/14 0001    Knee/Hip Exercises: Aerobic   Stationary Bike Nustep level 5 x 15 minutes, monitored for progression   Knee/Hip Exercises: Standing   Terminal Knee Extension Strengthening;Left;3 sets;10 reps   Theraband Level (Terminal  Knee Extension) --  PINK XTS (3sec hold with glut set)   Knee/Hip Exercises: Seated   Long Arc Quad Strengthening;Left;3 sets;10 reps   Long Arc Quad Weight 5 lbs.  with adduction squeeze   Knee/Hip Exercises: Supine   Straight Leg Raises Strengthening;Left;3 sets;10 reps  with slightER for VMO activation   Knee/Hip Exercises: Sidelying   Hip ABduction AROM;Strengthening;Left;2 sets;10 reps   Electrical Stimulation   Electrical Stimulation Location knee   Electrical Stimulation Action VMS to VMO   Electrical Stimulation Parameters 10/10 x57mn with 4#SAQ   Electrical Stimulation Goals Strength   Manual Therapy   Manual Therapy Passive ROM   Passive ROM low load holds for flexion                  PT Short Term Goals - 05/19/14 0841    PT SHORT TERM GOAL #1   Title Ind with initial HEP.   Time 2   Period Weeks   Status Achieved           PT Long Term Goals - 05/19/14 0841    PT LONG TERM GOAL  #1   Title Ind with advanced HEP.   Time 8   Period Weeks   Status Achieved   PT LONG TERM GOAL #2   Title Active left knee flexion to 115 degrees+.   Time 8   Period Weeks   Status On-going   PT LONG TERM GOAL #3   Title 5/5 left LE strength.   Time 8   Period Weeks   Status Achieved   PT LONG TERM GOAL #4   Title Perform all ADL's with pain not > 2/10   Time 8   Period Weeks   Status Achieved               Plan - 05/19/14 0848    Clinical Impression Statement patient continues to progress with all activities. reports little pain in left knee, has had no episodes of knee popping out and improved strength overall. met all goals except ROM goal due to limitiation. educated  patient  on self  knee flexion ROM 3-4 times a day to improve active ROM in left knee   Pt will benefit from skilled therapeutic intervention in order to improve on the following deficits Abnormal gait;Decreased strength;Pain;Decreased mobility;Increased edema;Decreased range of motion;Impaired flexibility   Rehab Potential Excellent   PT Frequency 2x / week   PT Duration 8 weeks   PT Treatment/Interventions Manual techniques;Therapeutic exercise;Cryotherapy;Electrical Stimulation;Scar mobilization;Moist Heat;Gait training;Passive range of motion;Stair training;Patient/family education   PT Next Visit Plan cont with POC per MD PJoni Fears  Consulted and Agree with Plan of Care Patient        Problem List Patient Active Problem List   Diagnosis Date Noted  . Primary osteoarthritis of left knee 03/25/2014  . Rheumatoid arthritis of knee 03/25/2014  . 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 DERMATITIS 09/28/2008  . CONSTIPATION 05/25/2008  . DYSPNEA ON EXERTION 05/25/2008  . ANEMIA, NORMOCYTIC 04/13/2008  . ANKLE PAIN, BILATERAL 11/25/2007  . DKellDISEASE 08/15/2006  . HYPERLIPIDEMIA 08/14/2006  . MORBID OBESITY 12/13/2005  . CARPAL TUNNEL SYNDROME 12/13/2005   . PERIPHERAL NEUROPATHY 12/13/2005  . HYPERTENSION 12/13/2005  . DYSRHYTHMIA, CARDIAC NOS 12/13/2005   CLadean Raya PTA 05/19/2014 9:08 AM Delina Kruczek, CVenetia Maxon5/11/2014, 9:08 AM  [facility] [address]Phone: [phone]  Fax:  [phone]

## 2014-05-25 DIAGNOSIS — M25562 Pain in left knee: Secondary | ICD-10-CM | POA: Diagnosis not present

## 2014-05-25 NOTE — Therapy (Signed)
[facility] [place], [place], [address] Phone: [phone]   Fax:  [phone]  Physical Therapy Treatment  Patient Details  Name: [name] MRN: [id number] Date of Birth: 09/24/1945 Referring Provider:  Iona Beard, MD  Encounter Date: 05/19/2014    Past Medical History  Diagnosis Date  . Hypertension   . H/O cardiovascular stress test 02/2014    seen by Dr. Einar Gip - told that she was cleared for surgery  . Heart murmur   . Shortness of breath dyspnea   . Sleep apnea     CPAP in use- QO night, study done at [facility]- 2007   . Arthritis     Rheumatoid, followed by Dr. Estanislado Pandy, knees & ankles   . Anemia     Past Surgical History  Procedure Laterality Date  . Abdominal hysterectomy    . Hand surgery    . Knee surgery  1990's   . Foot surgery Bilateral     bunionectomy, calluses , also has several pins & screws  . Carpal tunnel release Right   . Tubal ligation    . Joint replacement      planned for 03/25/2014  . Total knee arthroplasty Left 03/25/2014    Procedure: TOTAL KNEE ARTHROPLASTY;  Surgeon: Garald Balding, MD;  Location: [facility];  Service: Orthopedics;  Laterality: Left;    There were no vitals filed for this visit.  Visit Diagnosis:  Left knee pain - Plan: PT plan of care cert/re-cert  Knee stiffness, left - Plan: PT plan of care cert/re-cert                                 PT Short Term Goals - 05/19/14 0841    PT SHORT TERM GOAL #1   Title Ind with initial HEP.   Time 2   Period Weeks   Status Achieved           PT Long Term Goals - 05/19/14 0841    PT LONG TERM GOAL #1   Title Ind with advanced HEP.   Time 8   Period Weeks   Status Achieved   PT LONG TERM GOAL #2   Title Active left knee flexion to 115 degrees+.   Time 8   Period Weeks   Status On-going   PT LONG TERM GOAL #3   Title 5/5 left LE strength.   Time 8   Period Weeks   Status  Achieved   PT LONG TERM GOAL #4   Title Perform all ADL's with pain not > 2/10   Time 8   Period Weeks   Status Achieved                 G-Codes - May 28, 2014 1558    Functional Assessment Tool Used FOTO.   Functional Limitation Mobility: Walking and moving around   Mobility: Walking and Moving Around Current Status [phone]) At least 40 percent but less than 60 percent impaired, limited or restricted   Mobility: Walking and Moving Around Goal Status [phone]) At least 20 percent but less than 40 percent impaired, limited or restricted   Mobility: Walking and Moving Around Discharge Status [phone]) At least 40 percent but less than 60 percent impaired, limited or restricted      Problem List Patient Active Problem List   Diagnosis Date Noted  . Primary osteoarthritis of left knee 03/25/2014  . Rheumatoid  arthritis of knee 03/25/2014  . 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 DERMATITIS 09/28/2008  . CONSTIPATION 05/25/2008  . DYSPNEA ON EXERTION 05/25/2008  . ANEMIA, NORMOCYTIC 04/13/2008  . ANKLE PAIN, BILATERAL 11/25/2007  . DEGENERATIVE DISC DISEASE 08/15/2006  . HYPERLIPIDEMIA 08/14/2006  . MORBID OBESITY 12/13/2005  . CARPAL TUNNEL SYNDROME 12/13/2005  . PERIPHERAL NEUROPATHY 12/13/2005  . HYPERTENSION 12/13/2005  . DYSRHYTHMIA, CARDIAC NOS 12/13/2005  PHYSICAL THERAPY DISCHARGE SUMMARY  Visits from Start of Care: 12  Current functional level related to goals / functional outcomes: Please see above.   Remaining deficits: Goal #2 unmet.   Education / Equipment: HEP. Plan: Patient agrees to discharge.  Patient goals were partially met. Patient is being discharged due to the physician's request.  ?????       ,  MPT 05/25/2014, 3:59 PM  [facility] [address] [place], , [address] Phone: [phone]   Fax:  [phone]      

## 2014-05-26 ENCOUNTER — Encounter: Payer: Medicare Other | Admitting: Physical Therapy

## 2014-05-27 ENCOUNTER — Other Ambulatory Visit (HOSPITAL_COMMUNITY): Payer: Self-pay | Admitting: Family Medicine

## 2014-05-27 DIAGNOSIS — Z1231 Encounter for screening mammogram for malignant neoplasm of breast: Secondary | ICD-10-CM

## 2014-05-28 ENCOUNTER — Encounter: Payer: Medicare Other | Admitting: Physical Therapy

## 2014-06-02 ENCOUNTER — Ambulatory Visit (HOSPITAL_COMMUNITY): Payer: Medicare Other

## 2014-06-02 ENCOUNTER — Ambulatory Visit (HOSPITAL_COMMUNITY)
Admission: RE | Admit: 2014-06-02 | Discharge: 2014-06-02 | Disposition: A | Discharge status: Home / Self Care | Payer: Medicare Other | Source: Ambulatory Visit | Attending: Family Medicine | Admitting: Family Medicine

## 2014-06-02 DIAGNOSIS — Z1231 Encounter for screening mammogram for malignant neoplasm of breast: Secondary | ICD-10-CM | POA: Insufficient documentation

## 2014-10-15 ENCOUNTER — Other Ambulatory Visit (HOSPITAL_COMMUNITY): Payer: Self-pay | Admitting: Rheumatology

## 2014-10-15 DIAGNOSIS — M545 Low back pain: Secondary | ICD-10-CM

## 2014-11-01 ENCOUNTER — Ambulatory Visit (HOSPITAL_COMMUNITY)
Admission: RE | Admit: 2014-11-01 | Discharge: 2014-11-01 | Disposition: A | Discharge status: Home / Self Care | Payer: Medicare Other | Source: Ambulatory Visit | Attending: Rheumatology | Admitting: Rheumatology

## 2014-11-01 DIAGNOSIS — M4806 Spinal stenosis, lumbar region: Secondary | ICD-10-CM | POA: Diagnosis not present

## 2014-11-01 DIAGNOSIS — M545 Low back pain: Secondary | ICD-10-CM

## 2014-11-01 DIAGNOSIS — M4316 Spondylolisthesis, lumbar region: Secondary | ICD-10-CM | POA: Insufficient documentation

## 2014-11-01 DIAGNOSIS — M5137 Other intervertebral disc degeneration, lumbosacral region: Secondary | ICD-10-CM | POA: Diagnosis not present

## 2014-11-01 DIAGNOSIS — M4326 Fusion of spine, lumbar region: Secondary | ICD-10-CM | POA: Diagnosis not present

## 2014-12-28 ENCOUNTER — Other Ambulatory Visit (HOSPITAL_COMMUNITY): Payer: Self-pay | Admitting: Orthopaedic Surgery

## 2015-01-05 NOTE — Pre-Procedure Instructions (Signed)
    [name]  01/05/2015      [facility], [place] [address] [place] [address] Phone: [phone] Fax: [phone]    Your procedure is scheduled on Friday, January 14, 2015  Report to [facility] Admitting at 8:00 A.M.  Call this number if you have problems the morning of surgery:  [phone]   Remember:  Do not eat food or drink liquids after midnight Thursday, January 13, 2015  Take these medicines the morning of surgery with A SIP OF WATER : if needed: traMADol [name]) for pain Stop taking Aspirin, vitamins, fish oil, and herbal medications. Do not take any NSAIDs ie: Ibuprofen, Advil, Naproxen or any medication containing Aspirin; stop Sunday, January 09, 2015.   Do not wear jewelry, make-up or nail polish.  Do not wear lotions, powders, or perfumes.  You may not wear deodorant.  Do not shave 48 hours prior to surgery.    Do not bring valuables to the hospital.  [facility] is not responsible for any belongings or valuables.  Contacts, dentures or bridgework may not be worn into surgery.  Leave your suitcase in the car.  After surgery it may be brought to your room.  For patients admitted to the hospital, discharge time will be determined by your treatment team.   Name and phone number of your driver:  [name]  -- spouse  Special instructions:Shower the night before surgery and the morning of surgery with CHG.  Please read over the following fact sheets that you were given. Pain Booklet, Coughing and Deep Breathing, MRSA Information and Surgical Site Infection Prevention

## 2015-01-06 ENCOUNTER — Encounter (HOSPITAL_COMMUNITY): Payer: Self-pay

## 2015-01-06 ENCOUNTER — Encounter (HOSPITAL_COMMUNITY)
Admission: RE | Admit: 2015-01-06 | Discharge: 2015-01-06 | Disposition: A | Discharge status: Home / Self Care | Payer: Medicare Other | Source: Ambulatory Visit | Attending: Orthopaedic Surgery | Admitting: Orthopaedic Surgery

## 2015-01-06 ENCOUNTER — Ambulatory Visit (HOSPITAL_COMMUNITY)
Admission: RE | Admit: 2015-01-06 | Discharge: 2015-01-06 | Disposition: A | Discharge status: Home / Self Care | Payer: Medicare Other | Source: Ambulatory Visit | Attending: Orthopaedic Surgery | Admitting: Orthopaedic Surgery

## 2015-01-06 DIAGNOSIS — Z87891 Personal history of nicotine dependence: Secondary | ICD-10-CM | POA: Diagnosis not present

## 2015-01-06 DIAGNOSIS — Z01818 Encounter for other preprocedural examination: Secondary | ICD-10-CM | POA: Diagnosis not present

## 2015-01-06 DIAGNOSIS — I517 Cardiomegaly: Secondary | ICD-10-CM | POA: Diagnosis not present

## 2015-01-06 DIAGNOSIS — Z01812 Encounter for preprocedural laboratory examination: Secondary | ICD-10-CM | POA: Diagnosis not present

## 2015-01-06 DIAGNOSIS — M48061 Spinal stenosis, lumbar region without neurogenic claudication: Secondary | ICD-10-CM

## 2015-01-06 DIAGNOSIS — G4730 Sleep apnea, unspecified: Secondary | ICD-10-CM | POA: Diagnosis not present

## 2015-01-06 DIAGNOSIS — I10 Essential (primary) hypertension: Secondary | ICD-10-CM | POA: Insufficient documentation

## 2015-01-06 DIAGNOSIS — Z01810 Encounter for preprocedural cardiovascular examination: Secondary | ICD-10-CM | POA: Insufficient documentation

## 2015-01-06 DIAGNOSIS — R9431 Abnormal electrocardiogram [ECG] [EKG]: Secondary | ICD-10-CM | POA: Diagnosis not present

## 2015-01-06 LAB — SURGICAL PCR SCREEN
MRSA, PCR: NEGATIVE
Staphylococcus aureus: NEGATIVE

## 2015-01-06 LAB — CBC
HCT: 37.9 % (ref 36.0–46.0)
Hemoglobin: 11.9 g/dL — ABNORMAL LOW (ref 12.0–15.0)
MCH: 28.7 pg (ref 26.0–34.0)
MCHC: 31.4 g/dL (ref 30.0–36.0)
MCV: 91.5 fL (ref 78.0–100.0)
Platelets: 266 10*3/uL (ref 150–400)
RBC: 4.14 MIL/uL (ref 3.87–5.11)
RDW: 13.5 % (ref 11.5–15.5)
WBC: 8.2 10*3/uL (ref 4.0–10.5)

## 2015-01-06 LAB — COMPREHENSIVE METABOLIC PANEL
ALT: 13 U/L — ABNORMAL LOW (ref 14–54)
AST: 19 U/L (ref 15–41)
Albumin: 3.5 g/dL (ref 3.5–5.0)
Alkaline Phosphatase: 105 U/L (ref 38–126)
Anion gap: 8 (ref 5–15)
BUN: 19 mg/dL (ref 6–20)
CO2: 27 mmol/L (ref 22–32)
Calcium: 9.3 mg/dL (ref 8.9–10.3)
Chloride: 107 mmol/L (ref 101–111)
Creatinine, Ser: 1.14 mg/dL — ABNORMAL HIGH (ref 0.44–1.00)
GFR calc Af Amer: 56 mL/min — ABNORMAL LOW (ref >60)
GFR calc non Af Amer: 48 mL/min — ABNORMAL LOW (ref >60)
Glucose, Bld: 90 mg/dL (ref 65–99)
Potassium: 4.1 mmol/L (ref 3.5–5.1)
Sodium: 142 mmol/L (ref 135–145)
Total Bilirubin: 0.2 mg/dL — ABNORMAL LOW (ref 0.3–1.2)
Total Protein: 7.4 g/dL (ref 6.5–8.1)

## 2015-01-06 LAB — PROTIME-INR
INR: 1.02 (ref 0.00–1.49)
Prothrombin Time: 13.6 seconds (ref 11.6–15.2)

## 2015-01-06 NOTE — Progress Notes (Addendum)
Patient just had Knee surgery in March 2016.  She was sent to Dr. Einar Gip for cardiac workup.  Had a stress test (will request those results) and has NOT been back to see him since the follow up.  Denies any cardiac issues. PCP is Dr. French Ana. Had Sleep Study over 5 yrs ago.  Wears CPAP, but doesn't know the settings.

## 2015-01-11 NOTE — Progress Notes (Signed)
Anesthesia chart review: Patient is a 70 year old female scheduled for L2-3, L3-4 decompression on 01/14/2015 by Dr. Lorin Mercy.  History includes former smoker, hypertension, murmur (not specified), OSA with CPAP, rheumatoid arthritis, anemia, exertional dyspnea, hysterectomy, left TKA 03/25/14. PCP is listed as Dr. Iona Beard. She was seen by cardiologist Dr. Einar Gip in 02/2014 for pre-operative evaluation for TKA.    Meds include 65 FE, Folvite, Humira (held after 12/30/14), HCTZ, ramipril, tramadol.  01/06/15 EKG: NSR, possible LAE, non-specific T wave abnormality.  02/26/2014 Nuclear stress test [facility] CV): Impression:  1. Resting EKG: Normal sinus rhythm, normal axis. Nonspecific ST segment changes. Stress EKG is non-.diagnostic for ischemia. Stress symptoms included dizziness. 2. Myocardial perfusion imaging is normal. Overall left ventricular systolic function was normal without regional wall motion abnormalities. The left ventricular EF was 81%.  01/06/15 CXR: IMPRESSION: No active cardiopulmonary disease.  Preoperative labs noted.  If no acute changes then I anticipate that she can proceed as planned.  George Hugh [facility] Short Stay Center/Anesthesiology Phone [phone] 01/11/2015 11:37 AM

## 2015-01-13 MED ORDER — CEFAZOLIN SODIUM-DEXTROSE 2-3 GM-% IV SOLR
2.0000 g | INTRAVENOUS | Status: DC
  Filled 2015-01-13: qty 50

## 2015-01-13 MED ORDER — CHLORHEXIDINE GLUCONATE 4 % EX LIQD: 60.0000 mL | Freq: Once | CUTANEOUS | Status: DC

## 2015-01-14 ENCOUNTER — Ambulatory Visit (HOSPITAL_COMMUNITY): Admission: RE | Admit: 2015-01-14 | Payer: Medicare Other | Source: Ambulatory Visit | Admitting: Orthopaedic Surgery

## 2015-01-14 ENCOUNTER — Encounter (HOSPITAL_COMMUNITY): Admission: RE | Payer: Self-pay | Source: Ambulatory Visit

## 2015-01-14 SURGERY — LUMBAR LAMINECTOMY/DECOMPRESSION MICRODISCECTOMY
Anesthesia: General

## 2015-01-14 NOTE — Progress Notes (Signed)
I spoke with [name] and informed her of arrival time of 12:20 on Monday, January 9.

## 2015-01-14 NOTE — H&P (Signed)
[name] is an 70 y.o. female.    A 70 year old female seen with back pain and claudication symptoms when she stands up.  She has rheumatoid arthritis.  She has been on Humira.  She has had low back pain, numbness when she stands, can only stand for a few minutes then has to sit, ambulate long.  Onset of symptoms 2001.  She has had injections.  Saw Dr. Luiz Ochoa 2 years ago and surgery was recommended so the patient had proceeded with nonoperative treatment.  The patient has seronegative rheumatoid arthritis.     PAST SURGICAL HISTORY:   The patient had previous bunion surgery.  She also had hysterectomy THBSO and foot surgery.  Childbirth 1970, 1976. Admission for dehydration in 2012.   CURRENT MEDICATIONS:   The patient has been on Naprosyn, ramipril, folic acid, tramadol, hydrochlorothiazide, and Humira 40 mg twice a month.  She takes a baby aspirin a day, multivitamins and iron daily.     Past Medical History  Diagnosis Date  . Hypertension   . H/O cardiovascular stress test 02/2014    seen by Dr. Einar Gip - told that she was cleared for surgery  . Heart murmur   . Sleep apnea     CPAP in use- QO night, [facility] done [facility]- 2007   . Arthritis     Rheumatoid, followed by Dr. Estanislado Pandy, knees & ankles   . Anemia   . Shortness of breath dyspnea     with exertion    Past Surgical History  Procedure Laterality Date  . Abdominal hysterectomy    . Hand surgery    . Knee surgery  1990's   . Foot surgery Bilateral     bunionectomy, calluses , also has several pins & screws  . Carpal tunnel release Right   . Tubal ligation    . Joint replacement      planned for 03/25/2014  . Total knee arthroplasty Left 03/25/2014    Procedure: TOTAL KNEE ARTHROPLASTY;  Surgeon: Garald Balding, MD;  Location: [place];  Service: Orthopedics;  Laterality: Left;    No family history on file. Social History:  reports that she quit smoking about 34 years ago. She does not have any smokeless tobacco  history on file. She reports that she does not drink alcohol or use illicit drugs.  Allergies: No Known Allergies  No prescriptions prior to admission    No results found for this or any previous visit (from the past 48 hour(s)). No results found.  Review of Systems  Constitutional: Negative.   HENT: Negative.   Eyes: Negative.   Respiratory: Negative.   Cardiovascular: Negative.   Gastrointestinal: Negative.   Musculoskeletal: Positive for back pain.  Skin: Negative.   Neurological: Negative.   Psychiatric/Behavioral: Negative.     There were no vitals taken for this visit. Physical Exam  Constitutional: She is oriented to person, place, and time. No distress.  HENT:  Head: Atraumatic.  Eyes: EOM are normal.  Cardiovascular: Normal rate.   Respiratory: Effort normal.  GI: She exhibits no distension.  Musculoskeletal: She exhibits tenderness.  Neurological: She is alert and oriented to person, place, and time.  Skin: Skin is warm and dry.  Psychiatric: She has a normal mood and affect.      PHYSICAL EXAMINATION:  The patient is alert and oriented.  Good visual acuity with glasses.  No conjunctivitis.  Lungs are clear.  No audible wheezing.  Heart:  Regular rate and rhythm.  No abdominal tenderness.  Abdomen is soft.  Liver and spleen are not palpably enlarged.  The patient has no lower extremity muscle atrophy.  Reflexes are 2+.  Decreased range of motion of both shoulders.  Pain with forward flexion of the lumbar spine.  Distal pulse is palpable.  Negative log roll of her hips.     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  The L2-3 shows increased multifactorial spinal stenosis which is moderate.  There is facet hypertrophy.     ASSESSMENT:  The patient has progressive stenosis.     PLAN:  We discussed options with the patient.  Although  there is slight listhesis at L3-4, the plan would be 2-level decompression L2-3, L3-4.  I do not think at this point he needs L3-4 level fused.  My plan is to preserve the facets.  Use of the operative microscope.  Overnight stay, possible 2 nights for 2-level decompression based on his associated rheumatoid arthritis and other joints.  He will need to stop his Humira for 2 weeks before surgery and then we can restart it after surgery a week later.  Recommendations made.  Briant Angelillo M 01/14/2015, 7:09 AM

## 2015-01-14 NOTE — Progress Notes (Signed)
Pt notified of time of arrival for surgery on 01/17/15. Instructed pt to be here at 12:45 PM. She voiced understanding.

## 2015-01-17 ENCOUNTER — Encounter (HOSPITAL_COMMUNITY)
Admission: RE | Disposition: A | Discharge status: Home / Self Care | Payer: Self-pay | Source: Ambulatory Visit | Attending: Orthopaedic Surgery

## 2015-01-17 ENCOUNTER — Ambulatory Visit (HOSPITAL_COMMUNITY): Payer: Medicare Other | Admitting: Vascular Surgery

## 2015-01-17 ENCOUNTER — Encounter (HOSPITAL_COMMUNITY): Payer: Self-pay | Admitting: *Deleted

## 2015-01-17 ENCOUNTER — Inpatient Hospital Stay (HOSPITAL_COMMUNITY)
Admission: RE | Admit: 2015-01-17 | Discharge: 2015-01-21 | DRG: 518 | Disposition: A | Discharge status: Home / Self Care | Payer: Medicare Other | Source: Ambulatory Visit | Attending: Orthopaedic Surgery | Admitting: Orthopaedic Surgery

## 2015-01-17 ENCOUNTER — Ambulatory Visit (HOSPITAL_COMMUNITY): Payer: Medicare Other

## 2015-01-17 ENCOUNTER — Ambulatory Visit (HOSPITAL_COMMUNITY): Payer: Medicare Other | Admitting: Anesthesiology

## 2015-01-17 DIAGNOSIS — Z6841 Body Mass Index (BMI) 40.0 and over, adult: Secondary | ICD-10-CM

## 2015-01-17 DIAGNOSIS — M4806 Spinal stenosis, lumbar region: Secondary | ICD-10-CM | POA: Diagnosis present

## 2015-01-17 DIAGNOSIS — Z87891 Personal history of nicotine dependence: Secondary | ICD-10-CM | POA: Diagnosis not present

## 2015-01-17 DIAGNOSIS — M0600 Rheumatoid arthritis without rheumatoid factor, unspecified site: Secondary | ICD-10-CM | POA: Diagnosis present

## 2015-01-17 DIAGNOSIS — G9519 Other vascular myelopathies: Secondary | ICD-10-CM | POA: Diagnosis present

## 2015-01-17 DIAGNOSIS — R109 Unspecified abdominal pain: Secondary | ICD-10-CM

## 2015-01-17 DIAGNOSIS — Z96652 Presence of left artificial knee joint: Secondary | ICD-10-CM | POA: Diagnosis present

## 2015-01-17 DIAGNOSIS — Z9889 Other specified postprocedural states: Secondary | ICD-10-CM

## 2015-01-17 DIAGNOSIS — K567 Ileus, unspecified: Secondary | ICD-10-CM | POA: Diagnosis not present

## 2015-01-17 DIAGNOSIS — M48061 Spinal stenosis, lumbar region without neurogenic claudication: Secondary | ICD-10-CM | POA: Diagnosis present

## 2015-01-17 DIAGNOSIS — G4730 Sleep apnea, unspecified: Secondary | ICD-10-CM | POA: Diagnosis present

## 2015-01-17 DIAGNOSIS — I10 Essential (primary) hypertension: Secondary | ICD-10-CM | POA: Diagnosis present

## 2015-01-17 DIAGNOSIS — Z419 Encounter for procedure for purposes other than remedying health state, unspecified: Secondary | ICD-10-CM

## 2015-01-17 HISTORY — PX: LUMBAR LAMINECTOMY/DECOMPRESSION MICRODISCECTOMY: SHX5026

## 2015-01-17 LAB — PREPARE RBC (CROSSMATCH)

## 2015-01-17 SURGERY — LUMBAR LAMINECTOMY/DECOMPRESSION MICRODISCECTOMY
Anesthesia: General

## 2015-01-17 MED ORDER — FENTANYL CITRATE (PF) 100 MCG/2ML IJ SOLN
INTRAMUSCULAR | Status: DC | PRN
  Administered 2015-01-17: 50 ug via INTRAVENOUS
  Administered 2015-01-17: 150 ug via INTRAVENOUS
  Administered 2015-01-17 (×3): 50 ug via INTRAVENOUS
  Administered 2015-01-17: 100 ug via INTRAVENOUS
  Administered 2015-01-17: 50 ug via INTRAVENOUS

## 2015-01-17 MED ORDER — RAMIPRIL 2.5 MG PO CAPS
2.5000 mg | ORAL_CAPSULE | Freq: Every day | ORAL | Status: DC
  Administered 2015-01-17 – 2015-01-21 (×3): 2.5 mg via ORAL
  Filled 2015-01-17 (×5): qty 1

## 2015-01-17 MED ORDER — 0.9 % SODIUM CHLORIDE (POUR BTL) OPTIME
TOPICAL | Status: DC | PRN
  Administered 2015-01-17: 1000 mL

## 2015-01-17 MED ORDER — PHENYLEPHRINE 40 MCG/ML (10ML) SYRINGE FOR IV PUSH (FOR BLOOD PRESSURE SUPPORT)
PREFILLED_SYRINGE | INTRAVENOUS | Status: AC
  Filled 2015-01-17: qty 10

## 2015-01-17 MED ORDER — PHENOL 1.4 % MT LIQD: 1.0000 | OROMUCOSAL | Status: DC | PRN

## 2015-01-17 MED ORDER — ACETAMINOPHEN 325 MG PO TABS: 650.0000 mg | ORAL_TABLET | ORAL | Status: DC | PRN

## 2015-01-17 MED ORDER — ACETAMINOPHEN 650 MG RE SUPP: 650.0000 mg | RECTAL | Status: DC | PRN

## 2015-01-17 MED ORDER — SODIUM CHLORIDE 0.9 % IV SOLN
INTRAVENOUS | Status: DC | PRN
  Administered 2015-01-17: 18:00:00 via INTRAVENOUS

## 2015-01-17 MED ORDER — MIDAZOLAM HCL 5 MG/5ML IJ SOLN
INTRAMUSCULAR | Status: DC | PRN
  Administered 2015-01-17: 2 mg via INTRAVENOUS

## 2015-01-17 MED ORDER — LIDOCAINE HCL (CARDIAC) 20 MG/ML IV SOLN
INTRAVENOUS | Status: DC | PRN
  Administered 2015-01-17: 80 mg via INTRAVENOUS

## 2015-01-17 MED ORDER — METHOCARBAMOL 500 MG PO TABS
500.0000 mg | ORAL_TABLET | Freq: Four times a day (QID) | ORAL | Status: DC | PRN
  Administered 2015-01-17 – 2015-01-21 (×9): 500 mg via ORAL
  Filled 2015-01-17 (×10): qty 1

## 2015-01-17 MED ORDER — EPHEDRINE SULFATE 50 MG/ML IJ SOLN
INTRAMUSCULAR | Status: AC
  Filled 2015-01-17: qty 1

## 2015-01-17 MED ORDER — ARTIFICIAL TEARS OP OINT
TOPICAL_OINTMENT | OPHTHALMIC | Status: DC | PRN
  Administered 2015-01-17: 1 via OPHTHALMIC

## 2015-01-17 MED ORDER — POTASSIUM CHLORIDE IN NACL 20-0.45 MEQ/L-% IV SOLN
INTRAVENOUS | Status: DC
  Administered 2015-01-18 – 2015-01-20 (×5): via INTRAVENOUS
  Filled 2015-01-17 (×10): qty 1000

## 2015-01-17 MED ORDER — ARTIFICIAL TEARS OP OINT
TOPICAL_OINTMENT | OPHTHALMIC | Status: AC
  Filled 2015-01-17: qty 7

## 2015-01-17 MED ORDER — FENTANYL CITRATE (PF) 100 MCG/2ML IJ SOLN
INTRAMUSCULAR | Status: AC
  Administered 2015-01-17: 50 ug via INTRAVENOUS
  Filled 2015-01-17: qty 2

## 2015-01-17 MED ORDER — SODIUM CHLORIDE 0.9 % IV SOLN: 250.0000 mL | INTRAVENOUS | Status: DC

## 2015-01-17 MED ORDER — HEMOSTATIC AGENTS (NO CHARGE) OPTIME
TOPICAL | Status: DC | PRN
  Administered 2015-01-17: 1 via TOPICAL

## 2015-01-17 MED ORDER — ROCURONIUM BROMIDE 100 MG/10ML IV SOLN
INTRAVENOUS | Status: DC | PRN
  Administered 2015-01-17: 10 mg via INTRAVENOUS
  Administered 2015-01-17: 40 mg via INTRAVENOUS

## 2015-01-17 MED ORDER — LIDOCAINE HCL (CARDIAC) 20 MG/ML IV SOLN
INTRAVENOUS | Status: AC
  Filled 2015-01-17: qty 5

## 2015-01-17 MED ORDER — BISACODYL 10 MG RE SUPP: 10.0000 mg | Freq: Every day | RECTAL | Status: DC | PRN

## 2015-01-17 MED ORDER — SODIUM CHLORIDE 0.9 % IJ SOLN
3.0000 mL | Freq: Two times a day (BID) | INTRAMUSCULAR | Status: DC
  Administered 2015-01-19: 3 mL via INTRAVENOUS

## 2015-01-17 MED ORDER — GLYCOPYRROLATE 0.2 MG/ML IJ SOLN
INTRAMUSCULAR | Status: AC
  Filled 2015-01-17: qty 3

## 2015-01-17 MED ORDER — LACTATED RINGERS IV SOLN: INTRAVENOUS | Status: DC | PRN

## 2015-01-17 MED ORDER — FOLIC ACID 1 MG PO TABS
2.0000 mg | ORAL_TABLET | Freq: Every day | ORAL | Status: DC
  Administered 2015-01-17 – 2015-01-21 (×5): 2 mg via ORAL
  Filled 2015-01-17 (×5): qty 2

## 2015-01-17 MED ORDER — FENTANYL CITRATE (PF) 250 MCG/5ML IJ SOLN
INTRAMUSCULAR | Status: AC
  Filled 2015-01-17: qty 10

## 2015-01-17 MED ORDER — EPHEDRINE SULFATE 50 MG/ML IJ SOLN
INTRAMUSCULAR | Status: DC | PRN
  Administered 2015-01-17 (×5): 10 mg via INTRAVENOUS

## 2015-01-17 MED ORDER — HYDROCHLOROTHIAZIDE 25 MG PO TABS
25.0000 mg | ORAL_TABLET | Freq: Every day | ORAL | Status: DC
  Administered 2015-01-17 – 2015-01-21 (×3): 25 mg via ORAL
  Filled 2015-01-17 (×3): qty 1

## 2015-01-17 MED ORDER — METOCLOPRAMIDE HCL 5 MG/ML IJ SOLN: 10.0000 mg | Freq: Once | INTRAMUSCULAR | Status: DC | PRN

## 2015-01-17 MED ORDER — POTASSIUM CHLORIDE IN NACL 20-0.45 MEQ/L-% IV SOLN: INTRAVENOUS | Status: DC

## 2015-01-17 MED ORDER — CEFAZOLIN SODIUM-DEXTROSE 2-3 GM-% IV SOLR
INTRAVENOUS | Status: AC
  Filled 2015-01-17: qty 50

## 2015-01-17 MED ORDER — CEFAZOLIN SODIUM 1-5 GM-% IV SOLN
1.0000 g | Freq: Three times a day (TID) | INTRAVENOUS | Status: AC
  Administered 2015-01-18 (×2): 1 g via INTRAVENOUS
  Filled 2015-01-17 (×2): qty 50

## 2015-01-17 MED ORDER — SODIUM CHLORIDE 0.9 % IJ SOLN
INTRAMUSCULAR | Status: AC
  Filled 2015-01-17: qty 10

## 2015-01-17 MED ORDER — OXYCODONE-ACETAMINOPHEN 5-325 MG PO TABS
1.0000 | ORAL_TABLET | Freq: Four times a day (QID) | ORAL | Status: DC | PRN
  Administered 2015-01-18 – 2015-01-21 (×7): 2 via ORAL
  Filled 2015-01-17 (×7): qty 2

## 2015-01-17 MED ORDER — BUPIVACAINE HCL (PF) 0.25 % IJ SOLN
INTRAMUSCULAR | Status: AC
  Filled 2015-01-17: qty 30

## 2015-01-17 MED ORDER — HYDROCODONE-ACETAMINOPHEN 5-325 MG PO TABS
1.0000 | ORAL_TABLET | ORAL | Status: DC | PRN
  Administered 2015-01-18 – 2015-01-21 (×8): 2 via ORAL
  Filled 2015-01-17 (×8): qty 2

## 2015-01-17 MED ORDER — OXYCODONE-ACETAMINOPHEN 5-325 MG PO TABS: 1.0000 | ORAL_TABLET | ORAL | Status: DC | PRN

## 2015-01-17 MED ORDER — POLYETHYLENE GLYCOL 3350 17 G PO PACK: 17.0000 g | PACK | Freq: Every day | ORAL | Status: DC | PRN

## 2015-01-17 MED ORDER — ONDANSETRON HCL 4 MG/2ML IJ SOLN: 4.0000 mg | INTRAMUSCULAR | Status: DC | PRN

## 2015-01-17 MED ORDER — METHOCARBAMOL 500 MG PO TABS: 500.0000 mg | ORAL_TABLET | Freq: Four times a day (QID) | ORAL | Status: DC | PRN

## 2015-01-17 MED ORDER — PROPOFOL 10 MG/ML IV BOLUS
INTRAVENOUS | Status: AC
  Filled 2015-01-17: qty 20

## 2015-01-17 MED ORDER — BUPIVACAINE HCL (PF) 0.25 % IJ SOLN
INTRAMUSCULAR | Status: DC | PRN
  Administered 2015-01-17: 10 mL

## 2015-01-17 MED ORDER — MENTHOL 3 MG MT LOZG: 1.0000 | LOZENGE | OROMUCOSAL | Status: DC | PRN

## 2015-01-17 MED ORDER — POLYETHYLENE GLYCOL 3350 17 G PO PACK
17.0000 g | PACK | Freq: Every day | ORAL | Status: DC | PRN
  Administered 2015-01-18: 17 g via ORAL
  Filled 2015-01-17: qty 1

## 2015-01-17 MED ORDER — MORPHINE SULFATE (PF) 2 MG/ML IV SOLN
1.0000 mg | INTRAVENOUS | Status: DC | PRN
Start: 2015-01-17 — End: 2015-01-19
  Administered 2015-01-19: 4 mg via INTRAVENOUS
  Administered 2015-01-19: 2 mg via INTRAVENOUS
  Filled 2015-01-17: qty 1
  Filled 2015-01-17: qty 2

## 2015-01-17 MED ORDER — FENTANYL CITRATE (PF) 100 MCG/2ML IJ SOLN
25.0000 ug | INTRAMUSCULAR | Status: DC | PRN
  Administered 2015-01-17: 50 ug via INTRAVENOUS
  Administered 2015-01-17: 25 ug via INTRAVENOUS

## 2015-01-17 MED ORDER — SODIUM CHLORIDE 0.9 % IV SOLN
10.0000 mg | INTRAVENOUS | Status: DC | PRN
  Administered 2015-01-17: 20 ug/min via INTRAVENOUS

## 2015-01-17 MED ORDER — HYDROMORPHONE HCL 1 MG/ML IJ SOLN
0.5000 mg | INTRAMUSCULAR | Status: DC | PRN
  Administered 2015-01-18 (×3): 1 mg via INTRAVENOUS
  Filled 2015-01-17 (×3): qty 1

## 2015-01-17 MED ORDER — LACTATED RINGERS IV SOLN
INTRAVENOUS | Status: DC | PRN
  Administered 2015-01-17 (×4): via INTRAVENOUS

## 2015-01-17 MED ORDER — NEOSTIGMINE METHYLSULFATE 10 MG/10ML IV SOLN
INTRAVENOUS | Status: DC | PRN
  Administered 2015-01-17: 2.5 mg via INTRAVENOUS

## 2015-01-17 MED ORDER — METHOCARBAMOL 1000 MG/10ML IJ SOLN: 500.0000 mg | Freq: Four times a day (QID) | INTRAVENOUS | Status: DC | PRN

## 2015-01-17 MED ORDER — PROPOFOL 10 MG/ML IV BOLUS
INTRAVENOUS | Status: DC | PRN
  Administered 2015-01-17: 120 mg via INTRAVENOUS
  Administered 2015-01-17: 30 mg via INTRAVENOUS

## 2015-01-17 MED ORDER — ALBUMIN HUMAN 5 % IV SOLN
INTRAVENOUS | Status: DC | PRN
  Administered 2015-01-17 (×3): via INTRAVENOUS

## 2015-01-17 MED ORDER — CEFAZOLIN SODIUM-DEXTROSE 2-3 GM-% IV SOLR
INTRAVENOUS | Status: DC | PRN
  Administered 2015-01-17 (×2): 2 g via INTRAVENOUS

## 2015-01-17 MED ORDER — SODIUM CHLORIDE 0.9 % IJ SOLN: 3.0000 mL | INTRAMUSCULAR | Status: DC | PRN

## 2015-01-17 MED ORDER — ONDANSETRON HCL 4 MG/2ML IJ SOLN
INTRAMUSCULAR | Status: AC
  Filled 2015-01-17: qty 4

## 2015-01-17 MED ORDER — GLYCOPYRROLATE 0.2 MG/ML IJ SOLN
INTRAMUSCULAR | Status: DC | PRN
  Administered 2015-01-17: 0.3 mg via INTRAVENOUS

## 2015-01-17 MED ORDER — FERROUS SULFATE 325 (65 FE) MG PO TABS
325.0000 mg | ORAL_TABLET | Freq: Every day | ORAL | Status: DC
  Administered 2015-01-18 – 2015-01-21 (×4): 325 mg via ORAL
  Filled 2015-01-17 (×4): qty 1

## 2015-01-17 MED ORDER — MEPERIDINE HCL 25 MG/ML IJ SOLN: 6.2500 mg | INTRAMUSCULAR | Status: DC | PRN

## 2015-01-17 MED ORDER — ONDANSETRON HCL 4 MG/2ML IJ SOLN
INTRAMUSCULAR | Status: DC | PRN
  Administered 2015-01-17: 4 mg via INTRAVENOUS

## 2015-01-17 MED ORDER — DOCUSATE SODIUM 100 MG PO CAPS: 100.0000 mg | ORAL_CAPSULE | Freq: Two times a day (BID) | ORAL | Status: DC

## 2015-01-17 MED ORDER — DEXAMETHASONE SODIUM PHOSPHATE 10 MG/ML IJ SOLN
INTRAMUSCULAR | Status: AC
  Filled 2015-01-17: qty 1

## 2015-01-17 MED ORDER — MIDAZOLAM HCL 2 MG/2ML IJ SOLN
INTRAMUSCULAR | Status: AC
  Filled 2015-01-17: qty 2

## 2015-01-17 MED ORDER — DEXAMETHASONE SODIUM PHOSPHATE 10 MG/ML IJ SOLN
INTRAMUSCULAR | Status: DC | PRN
  Administered 2015-01-17: 10 mg via INTRAVENOUS

## 2015-01-17 MED ORDER — ONDANSETRON HCL 4 MG/2ML IJ SOLN
INTRAMUSCULAR | Status: AC
  Filled 2015-01-17: qty 2

## 2015-01-17 MED ORDER — DOCUSATE SODIUM 100 MG PO CAPS
100.0000 mg | ORAL_CAPSULE | Freq: Two times a day (BID) | ORAL | Status: DC
  Administered 2015-01-17 – 2015-01-21 (×8): 100 mg via ORAL
  Filled 2015-01-17 (×8): qty 1

## 2015-01-17 MED ORDER — ADALIMUMAB 40 MG/0.8ML ~~LOC~~ AJKT: 40.0000 mg | AUTO-INJECTOR | SUBCUTANEOUS | Status: DC

## 2015-01-17 MED ORDER — ARTIFICIAL TEARS OP OINT
TOPICAL_OINTMENT | OPHTHALMIC | Status: AC
  Filled 2015-01-17: qty 3.5

## 2015-01-17 SURGICAL SUPPLY — 58 items
ADH SKN CLS APL DERMABOND .7 (GAUZE/BANDAGES/DRESSINGS) ×1
BUR ROUND FLUTED 4 SOFT TCH (BURR) ×1 IMPLANT
BUR ROUND FLUTED 4MM SOFT TCH (BURR) ×1
CLOSURE STERI-STRIP 1/2X4 (GAUZE/BANDAGES/DRESSINGS) ×1
CLOSURE WOUND 1/2 X4 (GAUZE/BANDAGES/DRESSINGS) ×1
CLSR STERI-STRIP ANTIMIC 1/2X4 (GAUZE/BANDAGES/DRESSINGS) ×2 IMPLANT
COVER SURGICAL LIGHT HANDLE (MISCELLANEOUS) ×3 IMPLANT
DERMABOND ADVANCED (GAUZE/BANDAGES/DRESSINGS) ×2
DERMABOND ADVANCED .7 DNX12 (GAUZE/BANDAGES/DRESSINGS) ×1 IMPLANT
DRAPE MICROSCOPE LEICA (MISCELLANEOUS) ×3 IMPLANT
DRAPE PROXIMA HALF (DRAPES) ×6 IMPLANT
DRSG MEPILEX BORDER 4X4 (GAUZE/BANDAGES/DRESSINGS) ×1 IMPLANT
DRSG MEPILEX BORDER 4X8 (GAUZE/BANDAGES/DRESSINGS) ×3 IMPLANT
DURAPREP 26ML APPLICATOR (WOUND CARE) ×3 IMPLANT
DURASEAL SPINE SEALANT 3ML (MISCELLANEOUS) ×2 IMPLANT
ELECT BLADE 6.5 EXT (BLADE) ×2 IMPLANT
ELECT REM PT RETURN 9FT ADLT (ELECTROSURGICAL) ×3
ELECTRODE REM PT RTRN 9FT ADLT (ELECTROSURGICAL) ×1 IMPLANT
GLOVE BIOGEL PI IND STRL 6.5 (GLOVE) IMPLANT
GLOVE BIOGEL PI IND STRL 8 (GLOVE) ×2 IMPLANT
GLOVE BIOGEL PI INDICATOR 6.5 (GLOVE) ×4
GLOVE BIOGEL PI INDICATOR 8 (GLOVE) ×4
GLOVE ORTHO TXT STRL SZ7.5 (GLOVE) ×6 IMPLANT
GLOVE SURG SS PI 6.5 STRL IVOR (GLOVE) ×8 IMPLANT
GOWN STRL REUS W/ TWL LRG LVL3 (GOWN DISPOSABLE) ×2 IMPLANT
GOWN STRL REUS W/ TWL XL LVL3 (GOWN DISPOSABLE) ×1 IMPLANT
GOWN STRL REUS W/TWL 2XL LVL3 (GOWN DISPOSABLE) ×3 IMPLANT
GOWN STRL REUS W/TWL LRG LVL3 (GOWN DISPOSABLE) ×6
GOWN STRL REUS W/TWL XL LVL3 (GOWN DISPOSABLE) ×3
KIT BASIN OR (CUSTOM PROCEDURE TRAY) ×3 IMPLANT
KIT ROOM TURNOVER OR (KITS) ×3 IMPLANT
MANIFOLD NEPTUNE II (INSTRUMENTS) ×1 IMPLANT
MATRIX HEMOSTAT SURGIFLO (HEMOSTASIS) ×4 IMPLANT
NDL HYPO 25GX1X1/2 BEV (NEEDLE) ×1 IMPLANT
NDL SPNL 18GX3.5 QUINCKE PK (NEEDLE) ×1 IMPLANT
NEEDLE HYPO 25GX1X1/2 BEV (NEEDLE) ×3 IMPLANT
NEEDLE SPNL 18GX3.5 QUINCKE PK (NEEDLE) ×6 IMPLANT
NS IRRIG 1000ML POUR BTL (IV SOLUTION) ×3 IMPLANT
PACK LAMINECTOMY ORTHO (CUSTOM PROCEDURE TRAY) ×3 IMPLANT
PAD ARMBOARD 7.5X6 YLW CONV (MISCELLANEOUS) ×6 IMPLANT
PATTIES SURGICAL .5 X.5 (GAUZE/BANDAGES/DRESSINGS) ×2 IMPLANT
PATTIES SURGICAL .5 X1 (DISPOSABLE) ×4 IMPLANT
PATTIES SURGICAL .75X.75 (GAUZE/BANDAGES/DRESSINGS) IMPLANT
SPONGE LAP 4X18 X RAY DECT (DISPOSABLE) ×2 IMPLANT
SPONGE SURGIFOAM ABS GEL 100 (HEMOSTASIS) ×2 IMPLANT
STRIP CLOSURE SKIN 1/2X4 (GAUZE/BANDAGES/DRESSINGS) ×1 IMPLANT
SUT BONE WAX W31G (SUTURE) IMPLANT
SUT NURALON 4 0 TR CR/8 (SUTURE) ×2 IMPLANT
SUT VIC AB 0 CT1 27 (SUTURE) ×3
SUT VIC AB 0 CT1 27XBRD ANBCTR (SUTURE) ×1 IMPLANT
SUT VIC AB 2-0 CT1 27 (SUTURE) ×3
SUT VIC AB 2-0 CT1 TAPERPNT 27 (SUTURE) ×1 IMPLANT
SUT VIC AB 3-0 X1 27 (SUTURE) IMPLANT
SYR CONTROL 10ML LL (SYRINGE) ×2 IMPLANT
TOWEL OR 17X24 6PK STRL BLUE (TOWEL DISPOSABLE) ×3 IMPLANT
TOWEL OR 17X26 10 PK STRL BLUE (TOWEL DISPOSABLE) ×3 IMPLANT
TRAY FOLEY W/METER SILVER 16FR (SET/KITS/TRAYS/PACK) ×2 IMPLANT
WATER STERILE IRR 1000ML POUR (IV SOLUTION) ×3 IMPLANT

## 2015-01-17 NOTE — Transfer of Care (Signed)
Immediate Anesthesia Transfer of Care Note  Patient: [name]  Procedure(s) Performed: Procedure(s): L2-3, L3-4 Decompression (N/A)  Patient Location: PACU  Anesthesia Type:General  Level of Consciousness: awake, oriented, sedated, patient cooperative and responds to stimulation  Airway & Oxygen Therapy: Patient Spontanous Breathing and Patient connected to nasal cannula oxygen  Post-op Assessment: Report given to RN, Post -op Vital signs reviewed and stable, Patient moving all extremities and Patient moving all extremities X 4  Post vital signs: Reviewed and stable  Last Vitals:  Filed Vitals:   01/17/15 1249 01/17/15 1250  BP:  123/60  Pulse: 62   Temp: 36.7 C   Resp: 20     Complications: No apparent anesthesia complications

## 2015-01-17 NOTE — Progress Notes (Signed)
Patient ID: [name], female   DOB: 1945/08/29, 70 y.o.   MRN: [id number] PACU exam after extubation some right AT weakness. GC strong. Quads and HS working. No left LE weakness.

## 2015-01-17 NOTE — Anesthesia Procedure Notes (Signed)
Procedure Name: Intubation Date/Time: 01/17/2015 2:28 PM Performed by: Jacquiline Doe A Pre-anesthesia Checklist: Patient identified, Emergency Drugs available, Suction available, Patient being monitored and Timeout performed Patient Re-evaluated:Patient Re-evaluated prior to inductionOxygen Delivery Method: Circle system utilized Preoxygenation: Pre-oxygenation with 100% oxygen Intubation Type: IV induction Ventilation: Oral airway inserted - appropriate to patient size and Mask ventilation without difficulty Laryngoscope Size: Mac and 4 Grade View: Grade II Tube type: Oral Tube size: 7.5 mm Number of attempts: 1 Airway Equipment and Method: Stylet Placement Confirmation: ETT inserted through vocal cords under direct vision,  positive ETCO2 and breath sounds checked- equal and bilateral Secured at: 22 cm Tube secured with: Tape Dental Injury: Teeth and Oropharynx as per pre-operative assessment

## 2015-01-17 NOTE — Anesthesia Preprocedure Evaluation (Signed)
Anesthesia Evaluation  Patient identified by MRN, date of birth, ID band Patient awake    Reviewed: Allergy & Precautions, NPO status , Patient's Chart, lab work & pertinent test results, reviewed documented beta blocker date and time   Airway Mallampati: II  TM Distance: >3 FB Neck ROM: Full    Dental no notable dental hx. (+) Teeth Intact   Pulmonary shortness of breath and with exertion, sleep apnea , former smoker,    Pulmonary exam normal breath sounds clear to auscultation       Cardiovascular hypertension, Pt. on medications Normal cardiovascular exam Rhythm:Regular Rate:Normal     Neuro/Psych  Neuromuscular disease negative psych ROS   GI/Hepatic negative GI ROS, Neg liver ROS,   Endo/Other  Morbid obesity  Renal/GU negative Renal ROS  negative genitourinary   Musculoskeletal  (+) Arthritis , Osteoarthritis,  Lumbar spinal stenosis   Abdominal (+) + obese,   Peds  Hematology  (+) anemia ,   Anesthesia Other Findings   Reproductive/Obstetrics                             Anesthesia Physical Anesthesia Plan  ASA: III  Anesthesia Plan: General   Post-op Pain Management:    Induction: Intravenous  Airway Management Planned: Oral ETT  Additional Equipment:   Intra-op Plan:   Post-operative Plan: Extubation in OR  Informed Consent: I have reviewed the patients History and Physical, chart, labs and discussed the procedure including the risks, benefits and alternatives for the proposed anesthesia with the patient or authorized representative who has indicated his/her understanding and acceptance.   Dental advisory given  Plan Discussed with: CRNA, Anesthesiologist and Surgeon  Anesthesia Plan Comments:         Anesthesia Quick Evaluation

## 2015-01-17 NOTE — H&P (Signed)
[name] is an 70 y.o. female.   A 70 year old female seen with back pain and claudication symptoms when she stands up.  She has rheumatoid arthritis.  She has been on Humira.  She has had low back pain, numbness when she stands, can only stand for a few minutes then has to sit, ambulate long.  Onset of symptoms 2001.  She has had injections.  Saw Dr. Luiz Ochoa 2 years ago and surgery was recommended so the patient had proceeded with nonoperative treatment.  The patient has seronegative rheumatoid arthritis.     PAST SURGICAL HISTORY:   The patient had previous bunion surgery.  She also had hysterectomy THBSO and foot surgery.  Childbirth 1970, 1976. Admission for dehydration in 2012.   CURRENT MEDICATIONS:   The patient has been on Naprosyn, ramipril, folic acid, tramadol, hydrochlorothiazide, and Humira 40 mg twice a month.  She takes a baby aspirin a day, multivitamins and iron daily.       Past Medical History  Diagnosis Date  . Hypertension   . H/O cardiovascular stress test 02/2014    seen by Dr. Einar Gip - told that she was cleared for surgery  . Heart murmur   . Sleep apnea     CPAP in use- QO night, [facility] done [facility]- [facility]   . Arthritis     Rheumatoid, followed by Dr. Estanislado Pandy, knees & ankles   . Anemia   . Shortness of breath dyspnea     with exertion    Past Surgical History  Procedure Laterality Date  . Abdominal hysterectomy    . Hand surgery    . Knee surgery  1990's   . Foot surgery Bilateral     bunionectomy, calluses , also has several pins & screws  . Carpal tunnel release Right   . Tubal ligation    . Joint replacement      planned for 03/25/2014  . Total knee arthroplasty Left 03/25/2014    Procedure: TOTAL KNEE ARTHROPLASTY;  Surgeon: Garald Balding, MD;  Location: [facility];  Service: Orthopedics;  Laterality: Left;    No family history on file. Social History:  reports that she quit smoking about 34 years ago. She does not have any smokeless tobacco  history on file. She reports that she does not drink alcohol or use illicit drugs.  Allergies: No Known Allergies  No prescriptions prior to admission    No results found for this or any previous visit (from the past 48 hour(s)). No results found.  Review of Systems  Constitutional: Negative.   HENT: Negative.   Eyes: Negative.   Respiratory: Negative.   Cardiovascular: Negative.   Gastrointestinal: Negative.   Genitourinary: Negative.   Musculoskeletal: Positive for back pain.  Skin: Negative.   Psychiatric/Behavioral: Negative.     There were no vitals taken for this visit. Physical Exam  Constitutional: She is oriented to person, place, and time. No distress.  HENT:  Head: Atraumatic.  Eyes: EOM are normal.  Neck: Normal range of motion.  Cardiovascular: Normal rate.   Respiratory: No respiratory distress.  GI: She exhibits no distension.  Musculoskeletal: She exhibits tenderness.  Neurological: She is alert and oriented to person, place, and time.  Skin: Skin is warm and dry.  Psychiatric: She has a normal mood and affect.    PHYSICAL EXAMINATION:  The patient is alert and oriented.  Good visual acuity with glasses.  No conjunctivitis.  Lungs are clear.  No audible wheezing.  Heart:  Regular rate and rhythm.  No abdominal tenderness.  Abdomen is soft.  Liver and spleen are not palpably enlarged.  The patient has no lower extremity muscle atrophy.  Reflexes are 2+.  Decreased range of motion of both shoulders.  Pain with forward flexion of the lumbar spine.  Distal pulse is palpable.  Negative log roll of her hips.     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  The L2-3 shows increased multifactorial spinal stenosis which is moderate.  There is facet hypertrophy.     ASSESSMENT:  The patient has progressive stenosis.     PLAN:  We  discussed options with the patient.  Although there is slight listhesis at L3-4, the plan would be 2-level decompression L2-3, L3-4.  I do not think at this point he needs L3-4 level fused.  My plan is to preserve the facets.  Use of the operative microscope.  Overnight stay, possible 2 nights for 2-level decompression based on his associated rheumatoid arthritis and other joints.  He will need to stop his Humira for 2 weeks before surgery and then we can restart it after surgery a week later.  Recommendations made.   Fronie Holstein M 01/17/2015, 7:15 AM

## 2015-01-17 NOTE — Progress Notes (Signed)
RT note: Placed patient on CPAP 10 cmH2O per RCP protocol with nasal mask. Patient resting comfortably.

## 2015-01-17 NOTE — Interval H&P Note (Signed)
History and Physical Interval Note:  01/17/2015 11:09 AM  [name]  has presented today for surgery, with the diagnosis of L2-3, L3-4 Stenosis  The various methods of treatment have been discussed with the patient and family. After consideration of risks, benefits and other options for treatment, the patient has consented to  Procedure(s): L2-3, L3-4 Decompression (N/A) as a surgical intervention .  The patient's history has been reviewed, patient examined, no change in status, stable for surgery.  I have reviewed the patient's chart and labs.  Questions were answered to the patient's satisfaction.     YATES,MARK C

## 2015-01-17 NOTE — Anesthesia Postprocedure Evaluation (Signed)
Anesthesia Post Note  Patient: [name]  Procedure(s) Performed: Procedure(s) (LRB): L2-3, L3-4 Decompression (N/A)  Patient location during evaluation: PACU Anesthesia Type: General Level of consciousness: awake and alert Pain management: pain level controlled Vital Signs Assessment: post-procedure vital signs reviewed and stable Respiratory status: spontaneous breathing, nonlabored ventilation, respiratory function stable and patient connected to nasal cannula oxygen Cardiovascular status: blood pressure returned to baseline and stable Postop Assessment: no signs of nausea or vomiting Anesthetic complications: no    Last Vitals:  Filed Vitals:   01/17/15 2030 01/17/15 2045  BP: 114/70 106/67  Pulse: 79 76  Temp:    Resp: 9 8    Last Pain:  Filed Vitals:   01/17/15 2059  PainSc: 6                  Mckensi Redinger,W. EDMOND

## 2015-01-17 NOTE — Brief Op Note (Signed)
01/17/2015  7:33 PM  PATIENT:  [name]  70 y.o. female  PRE-OPERATIVE DIAGNOSIS:  L2-3, L3-4 Stenosis  POST-OPERATIVE DIAGNOSIS:  L2-3, L3-4 Stenosis  PROCEDURE:  Procedure(s): L2-3, L3-4 Decompression (N/A),  Dural repair  SURGEON:  Surgeon(s) and Role:    * [name], MD - Primary    * Jessy Oto, MD - Assisting  PHYSICIAN ASSISTANT:   ASSISTANTS: Benjiman Core PA-C.   Basil Dess MD   ANESTHESIA:   local and general  EBL:     BLOOD ADMINISTERED:2 units CC PRBC  DRAINS: none   LOCAL MEDICATIONS USED:  MARCAINE     SPECIMEN:  No Specimen  DISPOSITION OF SPECIMEN:  N/A  COUNTS:  YES  TOURNIQUET:  * No tourniquets in log *  DICTATION: .Other Dictation: Dictation Number [id number]  PLAN OF CARE: Admit to inpatient   PATIENT DISPOSITION:  PACU - hemodynamically stable.   Delay start of Pharmacological VTE agent (>24hrs) due to surgical blood loss or risk of bleeding: None.  SCD due to spinal procedure

## 2015-01-18 ENCOUNTER — Encounter (HOSPITAL_COMMUNITY): Payer: Self-pay | Admitting: Orthopaedic Surgery

## 2015-01-18 LAB — CBC
HCT: 30.3 % — ABNORMAL LOW (ref 36.0–46.0)
Hemoglobin: 10 g/dL — ABNORMAL LOW (ref 12.0–15.0)
MCH: 29.2 pg (ref 26.0–34.0)
MCHC: 33 g/dL (ref 30.0–36.0)
MCV: 88.6 fL (ref 78.0–100.0)
Platelets: 182 10*3/uL (ref 150–400)
RBC: 3.42 MIL/uL — ABNORMAL LOW (ref 3.87–5.11)
RDW: 13.7 % (ref 11.5–15.5)
WBC: 16.7 10*3/uL — ABNORMAL HIGH (ref 4.0–10.5)

## 2015-01-18 LAB — BASIC METABOLIC PANEL
Anion gap: 6 (ref 5–15)
BUN: 15 mg/dL (ref 6–20)
CO2: 26 mmol/L (ref 22–32)
Calcium: 8.2 mg/dL — ABNORMAL LOW (ref 8.9–10.3)
Chloride: 105 mmol/L (ref 101–111)
Creatinine, Ser: 1.01 mg/dL — ABNORMAL HIGH (ref 0.44–1.00)
GFR calc Af Amer: >60 mL/min (ref >60)
GFR calc non Af Amer: 55 mL/min — ABNORMAL LOW (ref >60)
Glucose, Bld: 133 mg/dL — ABNORMAL HIGH (ref 65–99)
Potassium: 4.1 mmol/L (ref 3.5–5.1)
Sodium: 137 mmol/L (ref 135–145)

## 2015-01-18 LAB — POCT I-STAT 4, (NA,K, GLUC, HGB,HCT)
Glucose, Bld: 100 mg/dL — ABNORMAL HIGH (ref 65–99)
Glucose, Bld: 153 mg/dL — ABNORMAL HIGH (ref 65–99)
HCT: 25 % — ABNORMAL LOW (ref 36.0–46.0)
HCT: 29 % — ABNORMAL LOW (ref 36.0–46.0)
Hemoglobin: 8.5 g/dL — ABNORMAL LOW (ref 12.0–15.0)
Hemoglobin: 9.9 g/dL — ABNORMAL LOW (ref 12.0–15.0)
Potassium: 3.2 mmol/L — ABNORMAL LOW (ref 3.5–5.1)
Potassium: 3.5 mmol/L (ref 3.5–5.1)
Sodium: 139 mmol/L (ref 135–145)
Sodium: 138 mmol/L (ref 135–145)

## 2015-01-18 NOTE — Progress Notes (Signed)
Subjective: 1 Day Post-Op Procedure(s) (LRB): L2-3, L3-4 Decompression (N/A) Patient reports pain as 6 on 0-10 scale.    Objective: Vital signs in last 24 hours: Temp:  [97.2 F (36.2 C)-98 F (36.7 C)] 98 F (36.7 C) (01/10 0640) Pulse Rate:  [62-86] 84 (01/10 0640) Resp:  [8-20] 17 (01/10 0640) BP: (90-123)/(53-77) 90/53 mmHg (01/10 0640) SpO2:  [90 %-100 %] 100 % (01/10 0640) Weight:  [99.338 kg (219 lb)] 99.338 kg (219 lb) (01/09 1249)  Intake/Output from previous day: 01/09 0701 - 01/10 0700 In: 5948.8 [I.V.:4478.8; Blood:670; IV [id number] Out: 2850 [Urine:1050; Blood:1800] Intake/Output this shift:     Recent Labs  01/17/15 1701 01/17/15 1841 01/18/15 0455  HGB 8.5* 9.9* 10.0*    Recent Labs  01/17/15 1841 01/18/15 0455  WBC  --  16.7*  RBC  --  3.42*  HCT 29.0* 30.3*  PLT  --  182    Recent Labs  01/17/15 1841 01/18/15 0455  NA 138 137  K 3.5 4.1  CL  --  105  CO2  --  26  BUN  --  15  CREATININE  --  1.01*  GLUCOSE 153* 133*  CALCIUM  --  8.2*   No results for input(s): LABPT, INR in the last 72 hours.  Neurologically intact  Her sensation both LE is intact all dermatomes.  GS, AT  Strong.  Quads intact.   Assessment/Plan: 1 Day Post-Op Procedure(s) (LRB): L2-3, L3-4 Decompression (N/A) Plan : bedrest for 3 days due to dural tear. Leave foley. OK to logroll  [name] C 01/18/2015, 9:23 AM

## 2015-01-18 NOTE — Op Note (Signed)
[name], [name]             ACCOUNT NO.:  [id number]  MEDICAL RECORD NO.:  [id number]  LOCATION:  [id number]                        FACILITY:  [facility]  PHYSICIAN:  Cornell Bourbon C. Lorin Mercy, M.D.    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  PROCEDURE:  L2-3, L3-4 lumbar decompression.  Repair of dural tear.  SURGEON:  Marykate Heuberger C. Lorin Mercy, M.D.  ASSISTANT:  Jessy Oto, M.D.  SECOND ASSISTANT:  Alyson Locket. Ricard Dillon, PA-C.  ANESTHESIA:  General plus Marcaine local.  INDICATIONS:  This is a 70 year old female, who has had previous auto fusion at L4-5.  She has developed critical stenosis at L3-4 with facet changes.  She is only able to stand for 2-3 minutes, can walk very short number of feet before she has to stop and rest.  She had been recommended surgery in the past years ago.  Stenosis at 2-3 was moderately severe.  She had narrowing of the AP diameter at the L3-4 level less than 4 mm.  DESCRIPTION OF PROCEDURE:  After standard prepping and draping, the patient placed in prone position.  Back was prepped with DuraPrep.  The patient's mass at 99.4 kilos.  Her Carter legs to be taped with extra tape, extra straps across her thighs and calves.  Arms were at 90-90. Yellow pads were used.  Some tincture of benzoin was applied over the sacrum pulling down the skin prior to wet prepping.  The area was squared with towels, sterile skin marker used in the midline and Betadine, Steri-Drape, laminectomy sheets and drape.  Time-out procedure was completed.  Prophylactic antibiotics were given.  Midline incision was made after needle localization, cross-table lateral, with two needles requiring maximal exposure for visualization due to her body habitus.   Dissection down the spinous process was performed, extra long instruments were needed including the Kerrisons.  Second x-ray was taken with the Kocher clamp was placed on, the level planned decompression. Third x-ray was taken after this was adjusted and then removal of the spinous processes, thinning of the lamina with the 4-mm bur and then removing the extremely thick chunks of ligament and overhanging spurs. Most severe level was 3-4 level, this was done first.  Second L2-3 level was not as severe, but there were some overhanging spurs.  The patient had previous epidural injections and at the L2-3 level on the right side at the very end of the decompression.  On the right lateral gutter, dural tear occurred or the tendon was scarred down laterally. Immediately, large number of nerve roots poked out and the patient had bleeding throughout the case, oozing from all areas.  Thrombin with Gelfoam, some Surgiflo, 2 x 2s were used, multiple patties were used. Multiple times bipolar cautery, regular cautery was used throughout the case.  Epidural vein bleeding was not as much a problem as oozing from the lateral walls.  Multiple applications of bone wax were used.  Dura was well decompressed out to the pedicles at the severe  3-4 level. Complete laminectomy was done at this level.  Dural tear was extremely difficult to repair and it took couple hours at multiple sutures.  There was longitudinal component that extended down 2 cm and then one that went from dorsal to ventral down in the axilla of the nerve root.  A 4-0 Nurolon was used.  The Nurolon needle had to be bent slightly. Multiples simple sutures were used for passage.  Stay sutures were placed on the edge, that helped with tucking of the nerve roots.  Benjiman Core, PA was assisting throughout the case and Dr. Louanne Skye came in after his office clinic and assisted in the last portion of the dural repair and assistance was medically necessary  for the dural repair.  Told the tucked nerve roots in place for exposure due to some bleeding that occurred.  Two units of packed cells was given by the Anesthesia Team. A total of 8 or 10 sutures were placed.  There was no spinal fluid leakage, but using the operative microscope, it was noted that the dura was thin.  Some Durabond was applied, which congealed quickly nicely. Due to the patient's thick body habitus, it was easy to obtain a fat graft to the sutures, stay sutures were left it, this was tied over the top of the fat graft without undue tension.  Meticulous fascial closure with #1 Vicryl, 0 Vicryl in the thick fat layer, 2-0 on the subcutaneous tissue, 3-0 Vicryl for subcuticular skin closure, Dermabond on the skin and then postop dressing was applied.  The patient tolerated the procedure well.  Marcaine was infiltrated in the skin prior to Steri- Strips were placed over the top of the Durabond.  Mepilex dressing was applied and the patient was transferred to the recovery room.  Once the patient was turned over, Foley catheter was inserted due to the extended surgical procedure taking longer due to the dural repair.  Foley catheter was placed.  The patient was not awake enough at this point for neurologic exam.  The patient was hemodynamically stable, although, she did receive 2 units of packed cells and will stay flat for 3 days where we get her up.     Shamell Hittle C. Lorin Mercy, M.D.     MCY/MEDQ  D:  01/17/2015  T:  01/18/2015  Job:  [id number]

## 2015-01-19 ENCOUNTER — Inpatient Hospital Stay (HOSPITAL_COMMUNITY): Payer: Medicare Other

## 2015-01-19 ENCOUNTER — Encounter (HOSPITAL_COMMUNITY): Payer: Self-pay | Admitting: General Practice

## 2015-01-19 LAB — CBC
HCT: 29.2 % — ABNORMAL LOW (ref 36.0–46.0)
Hemoglobin: 9.5 g/dL — ABNORMAL LOW (ref 12.0–15.0)
MCH: 29.4 pg (ref 26.0–34.0)
MCHC: 32.5 g/dL (ref 30.0–36.0)
MCV: 90.4 fL (ref 78.0–100.0)
Platelets: 178 10*3/uL (ref 150–400)
RBC: 3.23 MIL/uL — ABNORMAL LOW (ref 3.87–5.11)
RDW: 13.8 % (ref 11.5–15.5)
WBC: 18.4 10*3/uL — ABNORMAL HIGH (ref 4.0–10.5)

## 2015-01-19 MED ORDER — KETOROLAC TROMETHAMINE 30 MG/ML IJ SOLN: 30.0000 mg | Freq: Four times a day (QID) | INTRAMUSCULAR | Status: DC | PRN

## 2015-01-19 MED ORDER — BISACODYL 10 MG RE SUPP
10.0000 mg | Freq: Once | RECTAL | Status: AC
  Administered 2015-01-19: 10 mg via RECTAL
  Filled 2015-01-19: qty 1

## 2015-01-19 NOTE — Progress Notes (Signed)
Patient ID: [name], female   DOB: [birth year], 70 y.o.   MRN: [id number] Has decreased bowel sounds. No abdominal tenderness.KUB increased gas.  Will order suppository, stop Morphine, toradol ordered.   Logroll. Will start getting up Thursday late AM with therapy. Expect early ileus will resolve.  No rebound tenderness.

## 2015-01-19 NOTE — Progress Notes (Signed)
Subjective: Pain controlled. No bm since Saturday.  No flatus.  Feels bloated.  Denies N/V, photophobia, headache, dizziness.     Objective: Vital signs in last 24 hours: Temp:  [98.1 F (36.7 C)-100.5 F (38.1 C)] 98.7 F (37.1 C) (01/11 0600) Pulse Rate:  [77-95] 88 (01/11 0600) Resp:  [16-18] 18 (01/11 0600) BP: (98-110)/(46-62) 100/51 mmHg (01/11 0600) SpO2:  [95 %-100 %] 95 % (01/11 0600)  Intake/Output from previous day: 01/10 0701 - 01/11 0700 In: [id number] [P.O.:760; I.V.:825] Out: 2600 [Urine:2600] Intake/Output this shift:     Recent Labs  01/17/15 1701 01/17/15 1841 01/18/15 0455 01/19/15 0725  HGB 8.5* 9.9* 10.0* 9.5*    Recent Labs  01/18/15 0455 01/19/15 0725  WBC 16.7* 18.4*  RBC 3.42* 3.23*  HCT 30.3* 29.2*  PLT 182 178    Recent Labs  01/17/15 1841 01/18/15 0455  NA 138 137  K 3.5 4.1  CL  --  105  CO2  --  26  BUN  --  15  CREATININE  --  1.01*  GLUCOSE 153* 133*  CALCIUM  --  8.2*   No results for input(s): LABPT, INR in the last 72 hours.  Exam:  Pleasant bf alert and oriented x 3.  bilat calves nontender.  Continues to have right ant tibia and ehl weakness.    Assessment/Plan: Will get kub to r/o postop ileus.  Will keep patient flat until 3 days postop.  Continue present care.     Aerial Dilley M 01/19/2015, 10:32 AM

## 2015-01-20 LAB — CBC
HCT: 29 % — ABNORMAL LOW (ref 36.0–46.0)
Hemoglobin: 9.4 g/dL — ABNORMAL LOW (ref 12.0–15.0)
MCH: 29.5 pg (ref 26.0–34.0)
MCHC: 32.4 g/dL (ref 30.0–36.0)
MCV: 90.9 fL (ref 78.0–100.0)
Platelets: 193 10*3/uL (ref 150–400)
RBC: 3.19 MIL/uL — ABNORMAL LOW (ref 3.87–5.11)
RDW: 13.7 % (ref 11.5–15.5)
WBC: 18.7 10*3/uL — ABNORMAL HIGH (ref 4.0–10.5)

## 2015-01-20 NOTE — Progress Notes (Signed)
Subjective: 3 Days Post-Op Procedure(s) (LRB): L2-3, L3-4 Decompression (N/A) Patient reports pain as mild.  Small BM times 2.   Ready to get up  Objective: Vital signs in last 24 hours: Temp:  [98.3 F (36.8 C)-99.4 F (37.4 C)] 99.4 F (37.4 C) (01/12 0541) Pulse Rate:  [87-105] 94 (01/12 0541) Resp:  [16-18] 16 (01/12 0541) BP: (100-115)/(51-53) 103/51 mmHg (01/12 0541) SpO2:  [97 %-100 %] 100 % (01/12 0541)  Intake/Output from previous day: 01/11 0701 - 01/12 0700 In: 1620 [P.O.:720; I.V.:900] Out: 2300 [Urine:2300] Intake/Output this shift:     Recent Labs  01/17/15 1701 01/17/15 1841 01/18/15 0455 01/19/15 0725 01/20/15 0543  HGB 8.5* 9.9* 10.0* 9.5* 9.4*    Recent Labs  01/19/15 0725 01/20/15 0543  WBC 18.4* 18.7*  RBC 3.23* 3.19*  HCT 29.2* 29.0*  PLT 178 193    Recent Labs  01/17/15 1841 01/18/15 0455  NA 138 137  K 3.5 4.1  CL  --  105  CO2  --  26  BUN  --  15  CREATININE  --  1.01*  GLUCOSE 153* 133*  CALCIUM  --  8.2*   No results for input(s): LABPT, INR in the last 72 hours.  ABD soft Positive BS Assessment/Plan: 3 Days Post-Op Procedure(s) (LRB): L2-3, L3-4 Decompression (N/A) Up with therapy.   Resolving early ileus.  BM small times 2.  D/c bedrest.  OOB to recliner with PT. Will decide on SNF vs home with HHPT, bath aide based on how she does with therapy.   [name] C 01/20/2015, 7:46 AM

## 2015-01-20 NOTE — Care Management Important Message (Signed)
Important Message  Patient Details  Name: RAWDA BENALLY MRN: [id number] Date of Birth: [birth year]   Medicare Important Message Given:  Yes    Barb Merino Ayrabella Labombard 01/20/2015, 1:34 PM

## 2015-01-20 NOTE — Evaluation (Signed)
Physical Therapy Evaluation Patient Details Name: [name] MRN: [id number] DOB: 1945/10/03 Today's Date: 01/20/2015   History of Present Illness  70 yo admitted for L2-4 spinal decompression with dural tear and 3 days bedrest post-op. PMHx: L TKA, RA, HTN  Clinical Impression  Pt pleasant and eager to get OOB after 3 days bedrest. Pt with generalized weakness with need for assist for all mobility particularly powering up from surface. Pt with decreased RLE dorsiflexion and hip flexion which pt states as new since Sx. Pt and son educated for back precautions, transfers, gait and DME use with handout provided. Pt with decreased strength, mobility, function and gait who will benefit from acute therapy to maximize mobility and decrease burden of care.     Follow Up Recommendations Home health PT;Supervision/Assistance - 24 hour    Equipment Recommendations  None recommended by PT    Recommendations for Other Services       Precautions / Restrictions Precautions Precautions: Fall;Back Restrictions Weight Bearing Restrictions: No      Mobility  Bed Mobility Overal bed mobility: Needs Assistance Bed Mobility: Rolling;Sidelying to Sit Rolling: Mod assist Sidelying to sit: Mod assist       General bed mobility comments: cues for sequence with assist to rotate pelvis and elevate trunk from surface. Mod assist with pad for reciprocal scooting to EOB  Transfers Overall transfer level: Needs assistance   Transfers: Sit to/from Stand Sit to Stand: Mod assist         General transfer comment: cues for hand placement with increased assist to power up and rise from surface, min assist to sit in chair with armrests  Ambulation/Gait Ambulation/Gait assistance: Min assist Ambulation Distance (Feet): 30 Feet Assistive device: Rolling walker (2 wheeled) Gait Pattern/deviations: Step-through pattern;Decreased stride length;Trunk flexed;Shuffle;Decreased dorsiflexion - right    Gait velocity interpretation: Below normal speed for age/gender General Gait Details: pt with maintained hip flexion throughout gait with cues for posture, position in RW, looking up, chair to follow due to fatigue. Pt with decreased dorsiflexion RLE but clearing foot  Stairs            Wheelchair Mobility    Modified Rankin (Stroke Patients Only)       Balance                                             Pertinent Vitals/Pain Pain Assessment: 0-10 Pain Score: 5  Pain Location: lumbar  Pain Descriptors / Indicators: Aching Pain Intervention(s): Limited activity within patient's tolerance;Premedicated before session;Repositioned    Home Living Family/patient expects to be discharged to:: Private residence Living Arrangements: Spouse/significant other;Other relatives Available Help at Discharge: Family;Available 24 hours/day Type of Home: House Home Access: Stairs to enter Entrance Stairs-Rails: None Entrance Stairs-Number of Steps: 3 Home Layout: One level Home Equipment: Walker - 2 wheels;Bedside commode;Tub bench;Cane - single point Additional Comments: Pt has steps to get up onto bed as well    Prior Function Level of Independence: Independent with assistive device(s)         Comments: was walking in house with RW, relying on grocery cart for assist in stores     Hand Dominance        Extremity/Trunk Assessment   Upper Extremity Assessment: Generalized weakness           Lower Extremity Assessment: RLE deficits/detail;LLE deficits/detail RLE Deficits /  Details: dorsiflexion 2-/5, hip flexion 3/5, knee extension 4/5, knee flexion 5/5 LLE Deficits / Details: dorsiflexion 4/5, hip flexion 4/5, knee extension and flexion 5/5  Cervical / Trunk Assessment: Lordotic;Other exceptions  Communication   Communication: No difficulties  Cognition Arousal/Alertness: Awake/alert Behavior During Therapy: Flat affect Overall Cognitive  Status: Within Functional Limits for tasks assessed                      General Comments      Exercises        Assessment/Plan    PT Assessment Patient needs continued PT services  PT Diagnosis Difficulty walking;Acute pain;Generalized weakness   PT Problem List Decreased strength;Decreased activity tolerance;Decreased balance;Pain;Decreased knowledge of precautions;Decreased knowledge of use of DME;Decreased mobility  PT Treatment Interventions Stair training;Functional mobility training;Therapeutic activities;Therapeutic exercise;Gait training;DME instruction;Patient/family education   PT Goals (Current goals can be found in the Care Plan section) Acute Rehab PT Goals Patient Stated Goal: walk in the grocery store without leaning on the cart PT Goal Formulation: With patient/family Time For Goal Achievement: 02/03/15 Potential to Achieve Goals: Fair    Frequency Min 5X/week   Barriers to discharge        Co-evaluation               End of Session Equipment Utilized During Treatment: Gait belt Activity Tolerance: Patient tolerated treatment well Patient left: in chair;with call bell/phone within reach;with family/visitor present Nurse Communication: Mobility status;Precautions         Time: [id number] PT Time Calculation (min) (ACUTE ONLY): 24 min   Charges:   PT Evaluation $PT Eval Moderate Complexity: 1 Procedure PT Treatments $Therapeutic Activity: 8-22 mins   PT G CodesMelford Aase 01/20/2015, 12:30 PM Elwyn Reach, [place]

## 2015-01-21 LAB — TYPE AND SCREEN
ABO/RH(D): O POS
Antibody Screen: NEGATIVE
Unit division: 0
Unit division: 0
Unit division: 0
Unit division: 0

## 2015-01-21 MED ORDER — POLYETHYLENE GLYCOL 3350 17 G PO PACK: 17.0000 g | PACK | Freq: Every day | ORAL | Status: DC

## 2015-01-21 MED ORDER — METHOCARBAMOL 500 MG PO TABS: 500.0000 mg | ORAL_TABLET | Freq: Four times a day (QID) | ORAL | Status: DC | PRN

## 2015-01-21 MED ORDER — HYDROCODONE-ACETAMINOPHEN 10-325 MG PO TABS: 1.0000 | ORAL_TABLET | Freq: Four times a day (QID) | ORAL | Status: DC | PRN

## 2015-01-21 NOTE — Care Management Note (Signed)
Case Management Note  Patient Details  Name: [name] MRN: [id number] Date of Birth: 10-02-1945  Subjective/Objective:  70 yr old female s/p kyphoplasty                  Action/Plan: Case manager spoke with patient and wife concerning home health and DME needs at discharge. Choice was offered. Referral was called to Roby Lofts, [facility] Coordinator, Patient's wife provides 24hr care.    Expected Discharge Date:    01/21/15              Expected Discharge Plan:  [place]  In-House Referral:     Discharge planning Services  CM Consult  Post Acute Care Choice:  Durable Medical Equipment Choice offered to:  Patient, Spouse  DME Arranged:  Walker rolling DME Agency:  [place]:  PT [facility] Agency:  [place]  Status of Service:  Completed, signed off  Medicare Important Message Given:  Yes Date Medicare IM Given:    Medicare IM give by:    Date Additional Medicare IM Given:    Additional Medicare Important Message give by:     If discussed at [place] of Stay Meetings, dates discussed:    Additional Comments:  Ninfa Meeker, RN 01/21/2015, 12:27 PM

## 2015-01-21 NOTE — Care Management Note (Signed)
Case Management Note  Patient Details  Name: KALIJAH JARECKI MRN: [id number] Date of Birth: November 24, 1945  Subjective/Objective:    70 yr old female s/p L2-3, 3-4 lumbar decompression, with dural tear.                Action/Plan: Patient states she has family support at discharge and has rolling walker and 3in1. Case manager spoke with Dr. Lorin Mercy concerning home health, patient will not require therapy at home.   Expected Discharge Date:   01/21/15               Expected Discharge Plan:   home self/care  In-House Referral:     Discharge planning Services     Post Acute Care Choice:    Choice offered to:     DME Arranged:    DME Agency:     HH Arranged:    HH Agency:     Status of Service:     Medicare Important Message Given:  Yes Date Medicare IM Given:    Medicare IM give by:    Date Additional Medicare IM Given:    Additional Medicare Important Message give by:     If discussed at [place] of Stay Meetings, dates discussed:    Additional Comments:  Ninfa Meeker, RN 01/21/2015, 12:06 PM

## 2015-01-21 NOTE — Progress Notes (Signed)
Physical Therapy Treatment Patient Details Name: [name] MRN: [id number] DOB: 1945/09/08 Today's Date: 01/21/2015    History of Present Illness 70 yo admitted for L2-4 spinal decompression with dural tear and 3 days bedrest post-op. PMHx: L TKA, RA, HTN    PT Comments    Pt with improved transfers and gait today but remains limited. Pt educated for HEP to increase hip flexion and dorsiflexion with reinforcement of precautions and mobility throughout. Pt remains focused on return home with family assist. Will continue to follow. Education with use of handout provided again today.  Follow Up Recommendations  Home health PT;Supervision/Assistance - 24 hour     Equipment Recommendations       Recommendations for Other Services       Precautions / Restrictions Precautions Precautions: Fall;Back    Mobility  Bed Mobility Overal bed mobility: Needs Assistance Bed Mobility: Rolling;Sidelying to Sit Rolling: Min assist Sidelying to sit: Min assist       General bed mobility comments: cues for sequence with assist to rotate pelvis and elevate trunk from surface. pt able to scoot to EOB with cues only  Transfers Overall transfer level: Needs assistance   Transfers: Sit to/from Stand Sit to Stand: Min assist         General transfer comment: cues for hand placement with increased assist to power up and rise from surface  Ambulation/Gait Ambulation/Gait assistance: Min assist;+2 safety/equipment Ambulation Distance (Feet): 50 Feet Assistive device: Rolling walker (2 wheeled) Gait Pattern/deviations: Step-through pattern;Decreased stride length;Trunk flexed;Decreased dorsiflexion - right   Gait velocity interpretation: Below normal speed for age/gender General Gait Details: pt with maintained hip flexion throughout gait with cues for posture, position in RW, looking up, shoulder depression, increased RLE dorsiflexion and chair to follow due to fatigue. Pt with  decreased dorsiflexion RLE but clearing foot   Stairs            Wheelchair Mobility    Modified Rankin (Stroke Patients Only)       Balance                                    Cognition Arousal/Alertness: Awake/alert Behavior During Therapy: Flat affect Overall Cognitive Status: Within Functional Limits for tasks assessed                      Exercises General Exercises - Lower Extremity Long Arc Quad: AROM;Seated;Both;15 reps Hip Flexion/Marching: AROM;Seated;Both;15 reps Toe Raises: AROM;Seated;Both;15 reps    General Comments        Pertinent Vitals/Pain Pain Score: 4  Pain Location: right thigh aching, back sore Pain Descriptors / Indicators: Aching;Sore Pain Intervention(s): Limited activity within patient's tolerance;Monitored during session;Repositioned    Home Living                      Prior Function            PT Goals (current goals can now be found in the care plan section) Progress towards PT goals: Progressing toward goals    Frequency       PT Plan Current plan remains appropriate    Co-evaluation             End of Session Equipment Utilized During Treatment: Gait belt Activity Tolerance: Patient tolerated treatment well Patient left: in chair;with call bell/phone within reach;with family/visitor present     Time: [id number]  PT Time Calculation (min) (ACUTE ONLY): 17 min  Charges:  $Gait Training: 8-22 mins                    G Codes:      Melford Aase February 10, 2015, 9:17 AM Elwyn Reach, [place]

## 2015-01-21 NOTE — Progress Notes (Signed)
Subjective: Doing great.  Pain controlled.  Tolerating regular diet.  ready to go home.     Objective: Vital signs in last 24 hours: Temp:  [98.1 F (36.7 C)-98.7 F (37.1 C)] 98.7 F (37.1 C) (01/13 0430) Pulse Rate:  [98-99] 98 (01/13 0430) Resp:  [16-18] 16 (01/13 0430) BP: (88-115)/(51-65) 115/57 mmHg (01/13 0430) SpO2:  [96 %-99 %] 97 % (01/13 0430)  Intake/Output from previous day: 01/12 0701 - 01/13 0700 In: 1676.3 [I.V.:1676.3] Out: 1300 [Urine:1300] Intake/Output this shift: Total I/O In: 240 [P.O.:240] Out: -    Recent Labs  01/19/15 0725 01/20/15 0543  HGB 9.5* 9.4*    Recent Labs  01/19/15 0725 01/20/15 0543  WBC 18.4* 18.7*  RBC 3.23* 3.19*  HCT 29.2* 29.0*  PLT 178 193   No results for input(s): NA, K, CL, CO2, BUN, CREATININE, GLUCOSE, CALCIUM in the last 72 hours. No results for input(s): LABPT, INR in the last 72 hours.  Exam:  Wound looks good.  steris intact.  No drainage or signs of infection.  bilat calves nontender.  Still has right ant tib and ehl weakness.  Alert and oriented.    Assessment/Plan: D/c home today.  F/u with Dr Lorin Mercy next week.  Scripts on chart for norco, robaxin and miralax.  Call office if any issues.     [name] M 01/21/2015, 12:58 PM

## 2015-01-21 NOTE — Progress Notes (Addendum)
Pt ambulated to BR unable to void at this time 1655 pt ambulated to BR voided 125cc

## 2015-02-05 ENCOUNTER — Inpatient Hospital Stay (HOSPITAL_COMMUNITY)
Admission: AD | Admit: 2015-02-05 | Discharge: 2015-02-11 | DRG: 857 | Disposition: A | Discharge status: Home / Self Care | Payer: Medicare Other | Source: Ambulatory Visit | Attending: Orthopaedic Surgery | Admitting: Orthopaedic Surgery

## 2015-02-05 ENCOUNTER — Encounter (HOSPITAL_COMMUNITY): Payer: Self-pay | Admitting: *Deleted

## 2015-02-05 DIAGNOSIS — M069 Rheumatoid arthritis, unspecified: Secondary | ICD-10-CM | POA: Diagnosis present

## 2015-02-05 DIAGNOSIS — Y838 Other surgical procedures as the cause of abnormal reaction of the patient, or of later complication, without mention of misadventure at the time of the procedure: Secondary | ICD-10-CM | POA: Diagnosis present

## 2015-02-05 DIAGNOSIS — Z87891 Personal history of nicotine dependence: Secondary | ICD-10-CM | POA: Diagnosis not present

## 2015-02-05 DIAGNOSIS — T814XXA Infection following a procedure, initial encounter: Secondary | ICD-10-CM | POA: Diagnosis present

## 2015-02-05 DIAGNOSIS — B9689 Other specified bacterial agents as the cause of diseases classified elsewhere: Secondary | ICD-10-CM | POA: Diagnosis not present

## 2015-02-05 DIAGNOSIS — I10 Essential (primary) hypertension: Secondary | ICD-10-CM | POA: Diagnosis present

## 2015-02-05 DIAGNOSIS — Z96652 Presence of left artificial knee joint: Secondary | ICD-10-CM | POA: Diagnosis present

## 2015-02-05 DIAGNOSIS — Z79899 Other long term (current) drug therapy: Secondary | ICD-10-CM | POA: Diagnosis not present

## 2015-02-05 DIAGNOSIS — M869 Osteomyelitis, unspecified: Secondary | ICD-10-CM

## 2015-02-05 DIAGNOSIS — G4730 Sleep apnea, unspecified: Secondary | ICD-10-CM | POA: Diagnosis present

## 2015-02-05 DIAGNOSIS — T8140XA Infection following a procedure, unspecified, initial encounter: Secondary | ICD-10-CM | POA: Diagnosis present

## 2015-02-05 DIAGNOSIS — T8131XA Disruption of external operation (surgical) wound, not elsewhere classified, initial encounter: Secondary | ICD-10-CM | POA: Diagnosis present

## 2015-02-05 DIAGNOSIS — M545 Low back pain: Secondary | ICD-10-CM | POA: Diagnosis present

## 2015-02-05 DIAGNOSIS — S31000A Unspecified open wound of lower back and pelvis without penetration into retroperitoneum, initial encounter: Secondary | ICD-10-CM | POA: Diagnosis not present

## 2015-02-05 DIAGNOSIS — M4626 Osteomyelitis of vertebra, lumbar region: Secondary | ICD-10-CM | POA: Diagnosis present

## 2015-02-05 LAB — CBC WITH DIFFERENTIAL/PLATELET
Basophils Absolute: 0.1 10*3/uL (ref 0.0–0.1)
Basophils Relative: 0 %
Eosinophils Absolute: 0.2 10*3/uL (ref 0.0–0.7)
Eosinophils Relative: 1 %
HCT: 34.1 % — ABNORMAL LOW (ref 36.0–46.0)
Hemoglobin: 10.6 g/dL — ABNORMAL LOW (ref 12.0–15.0)
Lymphocytes Relative: 23 %
Lymphs Abs: 2.7 10*3/uL (ref 0.7–4.0)
MCH: 28.9 pg (ref 26.0–34.0)
MCHC: 31.1 g/dL (ref 30.0–36.0)
MCV: 92.9 fL (ref 78.0–100.0)
Monocytes Absolute: 0.5 10*3/uL (ref 0.1–1.0)
Monocytes Relative: 4 %
Neutro Abs: 8.6 10*3/uL — ABNORMAL HIGH (ref 1.7–7.7)
Neutrophils Relative %: 72 %
Platelets: 428 10*3/uL — ABNORMAL HIGH (ref 150–400)
RBC: 3.67 MIL/uL — ABNORMAL LOW (ref 3.87–5.11)
RDW: 13.9 % (ref 11.5–15.5)
WBC: 12 10*3/uL — ABNORMAL HIGH (ref 4.0–10.5)

## 2015-02-05 LAB — COMPREHENSIVE METABOLIC PANEL
ALT: 15 U/L (ref 14–54)
AST: 19 U/L (ref 15–41)
Albumin: 3.3 g/dL — ABNORMAL LOW (ref 3.5–5.0)
Alkaline Phosphatase: 87 U/L (ref 38–126)
Anion gap: 16 — ABNORMAL HIGH (ref 5–15)
BUN: 20 mg/dL (ref 6–20)
CO2: 26 mmol/L (ref 22–32)
Calcium: 9.6 mg/dL (ref 8.9–10.3)
Chloride: 101 mmol/L (ref 101–111)
Creatinine, Ser: 1.1 mg/dL — ABNORMAL HIGH (ref 0.44–1.00)
GFR calc Af Amer: 58 mL/min — ABNORMAL LOW (ref >60)
GFR calc non Af Amer: 50 mL/min — ABNORMAL LOW (ref >60)
Glucose, Bld: 126 mg/dL — ABNORMAL HIGH (ref 65–99)
Potassium: 3.8 mmol/L (ref 3.5–5.1)
Sodium: 143 mmol/L (ref 135–145)
Total Bilirubin: 0.5 mg/dL (ref 0.3–1.2)
Total Protein: 7.2 g/dL (ref 6.5–8.1)

## 2015-02-05 LAB — PROTIME-INR
INR: 1.12 (ref 0.00–1.49)
Prothrombin Time: 14.6 seconds (ref 11.6–15.2)

## 2015-02-05 LAB — APTT: aPTT: 35 seconds (ref 24–37)

## 2015-02-05 LAB — MRSA PCR SCREENING: MRSA by PCR: NEGATIVE

## 2015-02-05 MED ORDER — DOCUSATE SODIUM 100 MG PO CAPS
100.0000 mg | ORAL_CAPSULE | Freq: Every day | ORAL | Status: DC
  Administered 2015-02-07 – 2015-02-11 (×5): 100 mg via ORAL
  Filled 2015-02-05 (×5): qty 1

## 2015-02-05 MED ORDER — HYDROCODONE-ACETAMINOPHEN 5-325 MG PO TABS: 1.0000 | ORAL_TABLET | ORAL | Status: DC | PRN

## 2015-02-05 MED ORDER — PIPERACILLIN-TAZOBACTAM 3.375 G IVPB
3.3750 g | Freq: Three times a day (TID) | INTRAVENOUS | Status: DC
  Administered 2015-02-05 – 2015-02-08 (×8): 3.375 g via INTRAVENOUS
  Filled 2015-02-05 (×11): qty 50

## 2015-02-05 MED ORDER — SODIUM CHLORIDE 0.9 % IV SOLN
1500.0000 mg | Freq: Once | INTRAVENOUS | Status: AC
  Administered 2015-02-05: 1500 mg via INTRAVENOUS
  Filled 2015-02-05: qty 1500

## 2015-02-05 MED ORDER — SODIUM CHLORIDE 0.45 % IV SOLN
INTRAVENOUS | Status: DC
  Administered 2015-02-05: 50 mL/h via INTRAVENOUS

## 2015-02-05 MED ORDER — VANCOMYCIN HCL 10 G IV SOLR
1500.0000 mg | INTRAVENOUS | Status: DC
  Administered 2015-02-06 – 2015-02-08 (×2): 1500 mg via INTRAVENOUS
  Filled 2015-02-05 (×3): qty 1500

## 2015-02-05 MED ORDER — CHLORHEXIDINE GLUCONATE 4 % EX LIQD
60.0000 mL | Freq: Once | CUTANEOUS | Status: AC
  Administered 2015-02-06: 4 via TOPICAL
  Filled 2015-02-05: qty 60

## 2015-02-05 NOTE — Consult Note (Addendum)
ANTIBIOTIC CONSULT NOTE - INITIAL  Pharmacy Consult for Vancomycin and Zosyn Indication: wound infection  No Known Allergies  Patient Measurements: Height: 5' (152.4 cm) Weight: 207 lb 4.8 oz (94.031 kg) IBW/kg (Calculated) : 45.5  Vital Signs: Temp: 98.7 F (37.1 C) (01/28 1800) Temp Source: Oral (01/28 1800) BP: 120/81 mmHg (01/28 1800) Pulse Rate: 85 (01/28 1800)    Labs: Pending  Microbiology: No results found for this or any previous visit (from the past 720 hour(s)).  Medical History: Past Medical History  Diagnosis Date  . Hypertension   . H/O cardiovascular stress test 02/2014    seen by Dr. Einar Gip - told that she was cleared for surgery  . Heart murmur   . Sleep apnea     CPAP in use- QO night, study done at [facility]- 2007   . Arthritis     Rheumatoid, followed by Dr. Estanislado Pandy, knees & ankles   . Anemia   . Shortness of breath dyspnea     with exertion   Assessment: 69yof s/p back surgery 01/17/15 now with persistent drainage, foul odor, and tissue necrosis at her incision site. She will begin vancomycin and zosyn with plans for I&D tomorrow morning. Labs pending.  Goal of Therapy:  Vancomycin trough level 10-15 mcg/ml  Plan:  1) Vancomycin 1500mg  IV x 1 - f/u labs for maintenance dosing 2) Zosyn 3.375g IV q8 (4 hour infusion) 3) Follow renal function, cultures, LOT, level if needed  Deboraha Sprang 02/05/2015,6:51 PM   Addendum: Labs resulted. SCr is wnl at 1.1 with CrCl ~ 96ml/min.  Plan: 1) Continue with vancomycin 1500mg  IV q24   Deboraha Sprang 02/05/2015, 8:42 PM

## 2015-02-05 NOTE — H&P (Addendum)
[name] is an 70 y.o. female.   Chief Complaint: post op infection, draining lumbar incision after 2 level decompression 01/17/15 HPI: 70 yo female underwent lumbar 2 level decompression with dural tear and repair. After surgery patient has had some persistent drainage from her incision. There was an area of excoriation of the skin on each side of her incision. Some of this appeared to be from the tape burn. Patient has had edge necrosis of the skin with increased drainage and foul smell and some subcutaneous 2-0 Vicryl sutures of visible. She denies fever or chills. Her preoperative neurogenic claudication is significantly improved. She has not had any fever nor chills. Leg strength is been good other than some right anterior tib weakness which has persisted since the surgery. She denies bowel or bladder problems. Patient called me and stated she was having increased drainage with foul smell and was brought in for direct admission.  When incision was erythematous and she had slight drainage last week she was started on Doxycycline 100 mg po bid and has been on in for one week.   Past Medical History  Diagnosis Date  . Hypertension   . H/O cardiovascular stress test 02/2014    seen by Dr. Einar Gip - told that she was cleared for surgery  . Heart murmur   . Sleep apnea     CPAP in use- QO night, study done at [facility]- 2007   . Arthritis     Rheumatoid, followed by Dr. Estanislado Pandy, knees & ankles   . Anemia   . Shortness of breath dyspnea     with exertion    Past Surgical History  Procedure Laterality Date  . Abdominal hysterectomy    . Hand surgery    . Knee surgery  1990's   . Foot surgery Bilateral     bunionectomy, calluses , also has several pins & screws  . Carpal tunnel release Right   . Tubal ligation    . Joint replacement      planned for 03/25/2014  . Total knee arthroplasty Left 03/25/2014    Procedure: TOTAL KNEE ARTHROPLASTY;  Surgeon: Garald Balding, MD;  Location: [facility];   Service: Orthopedics;  Laterality: Left;  . Lumbar laminectomy/decompression microdiscectomy N/A 01/17/2015    Procedure: L2-3, L3-4 Decompression;  Surgeon: [name], MD;  Location: [facility];  Service: Orthopedics;  Laterality: N/A;    No family history on file. Social History:  reports that she quit smoking about 34 years ago. She has never used smokeless tobacco. She reports that she does not drink alcohol or use illicit drugs.  Allergies: No Known Allergies  Medications Prior to Admission  Medication Sig Dispense Refill  . doxycycline (VIBRA-TABS) 100 MG tablet Take 1 tablet by mouth 2 (two) times daily.    . ferrous sulfate 325 (65 FE) MG EC tablet Take 325 mg by mouth daily with breakfast.    . folic acid (FOLVITE) 1 MG tablet Take 2 mg by mouth daily.    . hydrochlorothiazide (HYDRODIURIL) 25 MG tablet Take 25 mg by mouth daily.    Marland Kitchen HYDROcodone-acetaminophen (NORCO) 10-325 MG tablet Take 1 tablet by mouth every 6 (six) hours as needed. 60 tablet 0  . methocarbamol (ROBAXIN) 500 MG tablet Take 1 tablet (500 mg total) by mouth every 6 (six) hours as needed for muscle spasms. 60 tablet 0  . Multiple Vitamin (MULTIVITAMIN WITH MINERALS) TABS tablet Take 1 tablet by mouth daily.    Marland Kitchen  polyethylene glycol (MIRALAX / GLYCOLAX) packet Take 17 g by mouth daily. 30 each 1  . ramipril (ALTACE) 2.5 MG capsule Take 2.5 mg by mouth daily.    . traMADol (ULTRAM) 50 MG tablet Take 50 mg by mouth every 8 (eight) hours as needed.    Marland Kitchen HUMIRA PEN 40 MG/0.8ML PNKT Inject 40 mg as directed every 14 (fourteen) days. Reported on 02/05/2015      No results found for this or any previous visit (from the past 48 hour(s)). No results found.  Review of Systems  Constitutional: Negative for fever, chills and weight loss.       Slight right AT weakness after surgery.   HENT: Negative for ear pain and tinnitus.   Eyes: Negative for blurred vision.  Respiratory: Negative for cough and hemoptysis.    Cardiovascular: Negative for chest pain and orthopnea.  Genitourinary: Negative for dysuria and urgency.  Neurological: Positive for weakness. Negative for headaches.  Psychiatric/Behavioral: Negative for depression and substance abuse.    Blood pressure 120/81, pulse 85, temperature 98.7 F (37.1 C), temperature source Oral, resp. rate 18, height 5' (1.524 m), weight 94.031 kg (207 lb 4.8 oz), SpO2 98 %. Physical Exam  Constitutional: She is oriented to person, place, and time. She appears well-developed and well-nourished.  HENT:  Head: Normocephalic and atraumatic.  Eyes: Conjunctivae are normal. Pupils are equal, round, and reactive to light.  Neck: Normal range of motion. Neck supple.  Neg. Nuchal rigidity  Cardiovascular: Normal rate and regular rhythm.   Respiratory: Effort normal.  GI: Soft. She exhibits no distension. There is no tenderness.  Neurological: She is alert and oriented to person, place, and time. No cranial nerve deficit.  Skin: Rash noted. There is erythema.  Psychiatric: She has a normal mood and affect. Her behavior is normal. Judgment and thought content normal.     Assessment/Plan Post op Lumbar Decompression wound dehiscence with infection , skin edge necrosis .  Culture obtained at bedside and also PCR nasal swab done by Nursing staff. Will proceed with IV antibiotic treatment  and surgery in the morning. Plan will be excision and debridement of necrotic tissue skin and subcutaneous tissue and fascia as necessary. Possible antibiotic bead placement and possible VAC. Procedure discussed with patient risks  of surgery discussed she and her husband agree and wish to proceed.  Had been on Humira before surgery but held it pre-op and has not restarted it. Sed rate , CRP , cultures, PCR, CBC. Vanc, Zosyn per pharmacy protocol. Dressing change as needed.  With previous dural repair from 01/17/15 may not be able to use VAC. If infection stays above the fascia then VAC  will be used. Discussed with pt and her husband in detail. Patient agrees to proceed . All ?'s answered.  Shelba Susi C 02/05/2015, 6:35 PM

## 2015-02-06 ENCOUNTER — Inpatient Hospital Stay (HOSPITAL_COMMUNITY): Payer: Medicare Other | Admitting: Anesthesiology

## 2015-02-06 ENCOUNTER — Encounter (HOSPITAL_COMMUNITY)
Admission: AD | Disposition: A | Discharge status: Home / Self Care | Payer: Self-pay | Source: Ambulatory Visit | Attending: Orthopaedic Surgery

## 2015-02-06 DIAGNOSIS — M4626 Osteomyelitis of vertebra, lumbar region: Secondary | ICD-10-CM | POA: Diagnosis present

## 2015-02-06 DIAGNOSIS — M869 Osteomyelitis, unspecified: Secondary | ICD-10-CM

## 2015-02-06 HISTORY — PX: LUMBAR LAMINECTOMY/DECOMPRESSION MICRODISCECTOMY: SHX5026

## 2015-02-06 SURGERY — LUMBAR LAMINECTOMY/DECOMPRESSION MICRODISCECTOMY
Anesthesia: General | Site: Back

## 2015-02-06 MED ORDER — VANCOMYCIN HCL 500 MG IV SOLR
INTRAVENOUS | Status: AC
  Filled 2015-02-06: qty 500

## 2015-02-06 MED ORDER — LACTATED RINGERS IV SOLN
INTRAVENOUS | Status: DC | PRN
  Administered 2015-02-06 (×2): via INTRAVENOUS

## 2015-02-06 MED ORDER — PHENYLEPHRINE HCL 10 MG/ML IJ SOLN
INTRAMUSCULAR | Status: DC | PRN
  Administered 2015-02-06 (×2): 80 ug via INTRAVENOUS

## 2015-02-06 MED ORDER — MIDAZOLAM HCL 2 MG/2ML IJ SOLN
INTRAMUSCULAR | Status: AC
  Filled 2015-02-06: qty 2

## 2015-02-06 MED ORDER — SODIUM CHLORIDE 0.9 % IR SOLN
Status: DC | PRN
  Administered 2015-02-06: 3000 mL

## 2015-02-06 MED ORDER — ROCURONIUM BROMIDE 50 MG/5ML IV SOLN
INTRAVENOUS | Status: AC
  Filled 2015-02-06: qty 1

## 2015-02-06 MED ORDER — FOLIC ACID 1 MG PO TABS
2.0000 mg | ORAL_TABLET | Freq: Every day | ORAL | Status: DC
  Administered 2015-02-06 – 2015-02-11 (×6): 2 mg via ORAL
  Filled 2015-02-06 (×6): qty 2

## 2015-02-06 MED ORDER — SUCCINYLCHOLINE CHLORIDE 20 MG/ML IJ SOLN
INTRAMUSCULAR | Status: DC | PRN
  Administered 2015-02-06: 120 mg via INTRAVENOUS

## 2015-02-06 MED ORDER — HYDROCHLOROTHIAZIDE 25 MG PO TABS
25.0000 mg | ORAL_TABLET | Freq: Every day | ORAL | Status: DC
  Administered 2015-02-06 – 2015-02-11 (×6): 25 mg via ORAL
  Filled 2015-02-06 (×6): qty 1

## 2015-02-06 MED ORDER — HYDROMORPHONE HCL 1 MG/ML IJ SOLN
0.2500 mg | INTRAMUSCULAR | Status: DC | PRN
  Administered 2015-02-06 (×4): 0.5 mg via INTRAVENOUS

## 2015-02-06 MED ORDER — MORPHINE SULFATE (PF) 2 MG/ML IV SOLN
2.0000 mg | INTRAVENOUS | Status: DC | PRN
  Administered 2015-02-06 – 2015-02-11 (×7): 2 mg via INTRAVENOUS
  Filled 2015-02-06 (×8): qty 1

## 2015-02-06 MED ORDER — HYDROMORPHONE HCL 1 MG/ML IJ SOLN
INTRAMUSCULAR | Status: AC
  Administered 2015-02-06: 0.5 mg via INTRAVENOUS
  Filled 2015-02-06: qty 1

## 2015-02-06 MED ORDER — RAMIPRIL 2.5 MG PO CAPS
2.5000 mg | ORAL_CAPSULE | Freq: Every day | ORAL | Status: DC
  Administered 2015-02-06 – 2015-02-11 (×5): 2.5 mg via ORAL
  Filled 2015-02-06 (×6): qty 1

## 2015-02-06 MED ORDER — VANCOMYCIN HCL 1000 MG IV SOLR
INTRAVENOUS | Status: DC | PRN
  Administered 2015-02-06: 1000 mg

## 2015-02-06 MED ORDER — SUCCINYLCHOLINE CHLORIDE 20 MG/ML IJ SOLN
INTRAMUSCULAR | Status: AC
  Filled 2015-02-06: qty 1

## 2015-02-06 MED ORDER — MIDAZOLAM HCL 2 MG/2ML IJ SOLN
INTRAMUSCULAR | Status: DC | PRN
  Administered 2015-02-06: 2 mg via INTRAVENOUS

## 2015-02-06 MED ORDER — SODIUM CHLORIDE 0.45 % IV SOLN
INTRAVENOUS | Status: DC
  Administered 2015-02-06: 75 mL/h via INTRAVENOUS

## 2015-02-06 MED ORDER — ADULT MULTIVITAMIN W/MINERALS CH
1.0000 | ORAL_TABLET | Freq: Every day | ORAL | Status: DC
  Administered 2015-02-06 – 2015-02-11 (×6): 1 via ORAL
  Filled 2015-02-06 (×6): qty 1

## 2015-02-06 MED ORDER — LIDOCAINE HCL (CARDIAC) 20 MG/ML IV SOLN
INTRAVENOUS | Status: AC
  Filled 2015-02-06: qty 5

## 2015-02-06 MED ORDER — POLYETHYLENE GLYCOL 3350 17 G PO PACK
17.0000 g | PACK | Freq: Every day | ORAL | Status: DC
  Administered 2015-02-06 – 2015-02-09 (×4): 17 g via ORAL
  Filled 2015-02-06 (×6): qty 1

## 2015-02-06 MED ORDER — VANCOMYCIN HCL 1000 MG IV SOLR
INTRAVENOUS | Status: AC
  Filled 2015-02-06: qty 1000

## 2015-02-06 MED ORDER — PROPOFOL 10 MG/ML IV BOLUS
INTRAVENOUS | Status: DC | PRN
  Administered 2015-02-06: 150 mg via INTRAVENOUS

## 2015-02-06 MED ORDER — 0.9 % SODIUM CHLORIDE (POUR BTL) OPTIME
TOPICAL | Status: DC | PRN
  Administered 2015-02-06: 1000 mL

## 2015-02-06 MED ORDER — FERROUS SULFATE 325 (65 FE) MG PO TABS
325.0000 mg | ORAL_TABLET | Freq: Every day | ORAL | Status: DC
  Administered 2015-02-07 – 2015-02-11 (×5): 325 mg via ORAL
  Filled 2015-02-06 (×9): qty 1

## 2015-02-06 MED ORDER — PROMETHAZINE HCL 25 MG/ML IJ SOLN: 6.2500 mg | INTRAMUSCULAR | Status: DC | PRN

## 2015-02-06 MED ORDER — ALBUMIN HUMAN 5 % IV SOLN
INTRAVENOUS | Status: DC | PRN
  Administered 2015-02-06: 10:00:00 via INTRAVENOUS

## 2015-02-06 MED ORDER — FENTANYL CITRATE (PF) 250 MCG/5ML IJ SOLN
INTRAMUSCULAR | Status: DC | PRN
  Administered 2015-02-06 (×5): 50 ug via INTRAVENOUS

## 2015-02-06 MED ORDER — PROPOFOL 10 MG/ML IV BOLUS
INTRAVENOUS | Status: AC
  Filled 2015-02-06: qty 20

## 2015-02-06 MED ORDER — SCOPOLAMINE 1 MG/3DAYS TD PT72
MEDICATED_PATCH | TRANSDERMAL | Status: AC
  Filled 2015-02-06: qty 1

## 2015-02-06 MED ORDER — FENTANYL CITRATE (PF) 250 MCG/5ML IJ SOLN
INTRAMUSCULAR | Status: AC
  Filled 2015-02-06: qty 5

## 2015-02-06 MED ORDER — HYDROCODONE-ACETAMINOPHEN 10-325 MG PO TABS
1.0000 | ORAL_TABLET | Freq: Four times a day (QID) | ORAL | Status: DC | PRN
  Administered 2015-02-06 – 2015-02-11 (×11): 1 via ORAL
  Filled 2015-02-06 (×11): qty 1

## 2015-02-06 MED ORDER — ONDANSETRON HCL 4 MG/2ML IJ SOLN
INTRAMUSCULAR | Status: AC
  Filled 2015-02-06: qty 2

## 2015-02-06 MED ORDER — LIDOCAINE HCL (CARDIAC) 20 MG/ML IV SOLN
INTRAVENOUS | Status: DC | PRN
  Administered 2015-02-06: 60 mg via INTRATRACHEAL

## 2015-02-06 MED ORDER — ONDANSETRON HCL 4 MG/2ML IJ SOLN
INTRAMUSCULAR | Status: DC | PRN
  Administered 2015-02-06: 4 mg via INTRAVENOUS

## 2015-02-06 MED ORDER — SCOPOLAMINE 1 MG/3DAYS TD PT72
MEDICATED_PATCH | TRANSDERMAL | Status: DC | PRN
  Administered 2015-02-06: 1 via TRANSDERMAL

## 2015-02-06 SURGICAL SUPPLY — 58 items
ADH SKN CLS APL DERMABOND .7 (GAUZE/BANDAGES/DRESSINGS)
BUR ROUND FLUTED 4 SOFT TCH (BURR) IMPLANT
BUR ROUND FLUTED 4MM SOFT TCH (BURR)
CLOSURE STERI-STRIP 1/2X4 (GAUZE/BANDAGES/DRESSINGS)
CLSR STERI-STRIP ANTIMIC 1/2X4 (GAUZE/BANDAGES/DRESSINGS) ×1 IMPLANT
COVER SURGICAL LIGHT HANDLE (MISCELLANEOUS) ×3 IMPLANT
DERMABOND ADVANCED (GAUZE/BANDAGES/DRESSINGS)
DERMABOND ADVANCED .7 DNX12 (GAUZE/BANDAGES/DRESSINGS) ×1 IMPLANT
DRAPE INCISE IOBAN 66X45 STRL (DRAPES) ×2 IMPLANT
DRAPE MICROSCOPE LEICA (MISCELLANEOUS) ×3 IMPLANT
DRAPE PROXIMA HALF (DRAPES) ×6 IMPLANT
DRAPE SURG 17X23 STRL (DRAPES) ×8 IMPLANT
DRESSING ALLEVYN LIFE SACRUM (GAUZE/BANDAGES/DRESSINGS) ×2 IMPLANT
DRSG MEPILEX BORDER 4X4 (GAUZE/BANDAGES/DRESSINGS) ×1 IMPLANT
DRSG MEPILEX BORDER 4X8 (GAUZE/BANDAGES/DRESSINGS) ×1 IMPLANT
DRSG PAD ABDOMINAL 8X10 ST (GAUZE/BANDAGES/DRESSINGS) ×2 IMPLANT
DRSG VAC ATS SM SENSATRAC (GAUZE/BANDAGES/DRESSINGS) ×2 IMPLANT
DURAPREP 26ML APPLICATOR (WOUND CARE) ×3 IMPLANT
ELECT REM PT RETURN 9FT ADLT (ELECTROSURGICAL) ×3
ELECTRODE REM PT RTRN 9FT ADLT (ELECTROSURGICAL) ×1 IMPLANT
GLOVE BIOGEL PI IND STRL 7.5 (GLOVE) IMPLANT
GLOVE BIOGEL PI IND STRL 8 (GLOVE) ×2 IMPLANT
GLOVE BIOGEL PI INDICATOR 7.5 (GLOVE) ×2
GLOVE BIOGEL PI INDICATOR 8 (GLOVE) ×2
GLOVE ORTHO TXT STRL SZ7.5 (GLOVE) ×4 IMPLANT
GOWN STRL REUS W/ TWL LRG LVL3 (GOWN DISPOSABLE) ×2 IMPLANT
GOWN STRL REUS W/ TWL XL LVL3 (GOWN DISPOSABLE) ×1 IMPLANT
GOWN STRL REUS W/TWL 2XL LVL3 (GOWN DISPOSABLE) ×1 IMPLANT
GOWN STRL REUS W/TWL LRG LVL3 (GOWN DISPOSABLE) ×3
GOWN STRL REUS W/TWL XL LVL3 (GOWN DISPOSABLE) ×3
HANDPIECE INTERPULSE COAX TIP (DISPOSABLE) ×3
KIT BASIN OR (CUSTOM PROCEDURE TRAY) ×3 IMPLANT
KIT ROOM TURNOVER OR (KITS) ×3 IMPLANT
KIT STIMULAN RAPID CURE 5CC (Orthopedic Implant) ×2 IMPLANT
MANIFOLD NEPTUNE II (INSTRUMENTS) ×3 IMPLANT
NDL HYPO 25GX1X1/2 BEV (NEEDLE) ×1 IMPLANT
NDL SPNL 18GX3.5 QUINCKE PK (NEEDLE) ×1 IMPLANT
NEEDLE HYPO 25GX1X1/2 BEV (NEEDLE) ×3 IMPLANT
NEEDLE SPNL 18GX3.5 QUINCKE PK (NEEDLE) IMPLANT
NS IRRIG 1000ML POUR BTL (IV SOLUTION) ×3 IMPLANT
PACK LAMINECTOMY ORTHO (CUSTOM PROCEDURE TRAY) ×3 IMPLANT
PAD ARMBOARD 7.5X6 YLW CONV (MISCELLANEOUS) ×6 IMPLANT
PAD NEG PRESSURE SENSATRAC (MISCELLANEOUS) ×2 IMPLANT
PATTIES SURGICAL .5 X.5 (GAUZE/BANDAGES/DRESSINGS) IMPLANT
PATTIES SURGICAL .5 X1 (DISPOSABLE) ×2 IMPLANT
PATTIES SURGICAL .75X.75 (GAUZE/BANDAGES/DRESSINGS) IMPLANT
SET HNDPC FAN SPRY TIP SCT (DISPOSABLE) IMPLANT
SPONGE LAP 18X18 X RAY DECT (DISPOSABLE) ×2 IMPLANT
SUT BONE WAX W31G (SUTURE) ×2 IMPLANT
SUT ETHILON 2 LR (SUTURE) ×4 IMPLANT
SUT VIC AB 0 CT1 27 (SUTURE)
SUT VIC AB 0 CT1 27XBRD ANBCTR (SUTURE) ×1 IMPLANT
SUT VIC AB 2-0 CT1 27 (SUTURE)
SUT VIC AB 2-0 CT1 TAPERPNT 27 (SUTURE) ×1 IMPLANT
SUT VIC AB 3-0 X1 27 (SUTURE) IMPLANT
TOWEL OR 17X24 6PK STRL BLUE (TOWEL DISPOSABLE) ×3 IMPLANT
TOWEL OR 17X26 10 PK STRL BLUE (TOWEL DISPOSABLE) ×3 IMPLANT
WATER STERILE IRR 1000ML POUR (IV SOLUTION) ×3 IMPLANT

## 2015-02-06 NOTE — Transfer of Care (Signed)
Immediate Anesthesia Transfer of Care Note  Patient: [name]  Procedure(s) Performed: Procedure(s): I AND D LUMBAR WITH WOUND VAC PLACEMENT (N/A)  Patient Location: PACU  Anesthesia Type:General  Level of Consciousness: awake  Airway & Oxygen Therapy: Patient Spontanous Breathing and Patient connected to nasal cannula oxygen  Post-op Assessment: Report given to RN and Post -op Vital signs reviewed and stable  Post vital signs: Reviewed and stable  Last Vitals:  Filed Vitals:   02/05/15 2046 02/06/15 0547  BP: 109/56 131/59  Pulse: 91 85  Temp: 37.2 C 36.8 C  Resp: 18 18    Complications: No apparent anesthesia complications

## 2015-02-06 NOTE — Brief Op Note (Signed)
02/05/2015 - 02/06/2015  10:53 AM  PATIENT:  [name]  70 y.o. female  PRE-OPERATIVE DIAGNOSIS:  i and d lumbar  POST-OPERATIVE DIAGNOSIS:  i and d lumbar  PROCEDURE:  Procedure(s): I AND D LUMBAR WITH WOUND VAC PLACEMENT (N/A) superficial infection.    Stimulin antibiotic bead placement and small VAC  SURGEON:  Surgeon(s) and Role:    * [name], MD - Primary  PHYSICIAN ASSISTANT:   ASSISTANTS: none   ANESTHESIA:   general  EBL:  Total I/O In: [id number] [I.V.:1500; IV Piggyback:250] Out: 50 [Blood:50]  BLOOD ADMINISTERED:none  DRAINS: Small VAC continuous  LOCAL MEDICATIONS USED:  NONE  SPECIMEN:  Source of Specimen:  cultures of lumbar incision wound  DISPOSITION OF SPECIMEN:  N/A  COUNTS:  YES  TOURNIQUET:  * No tourniquets in log *  DICTATION: .Other Dictation: Dictation Number [id number]  PLAN OF CARE: already inpt  PATIENT DISPOSITION:  PACU - hemodynamically stable.   Delay start of Pharmacological VTE agent (>24hrs) due to surgical blood loss or risk of bleeding: not applicable

## 2015-02-06 NOTE — Interval H&P Note (Signed)
History and Physical Interval Note:  02/06/2015 9:29 AM  [name]  has presented today for surgery, with the diagnosis of i and d lumbar  The various methods of treatment have been discussed with the patient and family. After consideration of risks, benefits and other options for treatment, the patient has consented to  Procedure(s): I AND D LUMBAR  (N/A) as a surgical intervention .  The patient's history has been reviewed, patient examined, no change in status, stable for surgery.  I have reviewed the patient's chart and labs.  Questions were answered to the patient's satisfaction.     YATES,MARK C

## 2015-02-06 NOTE — Anesthesia Procedure Notes (Signed)
Procedure Name: Intubation Date/Time: 02/06/2015 9:41 AM Performed by: Marinda Elk A Pre-anesthesia Checklist: Patient identified, Emergency Drugs available, Suction available, Patient being monitored and Timeout performed Patient Re-evaluated:Patient Re-evaluated prior to inductionOxygen Delivery Method: Circle system utilized Preoxygenation: Pre-oxygenation with 100% oxygen Intubation Type: IV induction Laryngoscope Size: Mac and 3 Grade View: Grade III Tube type: Oral Tube size: 7.5 mm Number of attempts: 1 Airway Equipment and Method: Stylet Placement Confirmation: ETT inserted through vocal cords under direct vision,  positive ETCO2 and breath sounds checked- equal and bilateral Secured at: 22 cm Tube secured with: Tape Dental Injury: Teeth and Oropharynx as per pre-operative assessment

## 2015-02-06 NOTE — Op Note (Signed)
NAMECARRE, BEREZIN             ACCOUNT NO.:  [id number]  MEDICAL RECORD NO.:  [id number]  LOCATION:  [facility]                         FACILITY:  [facility]  PHYSICIAN:  Vada Yellen C. Lorin Mercy, M.D.    DATE OF BIRTH:  Dec 07, 1945  DATE OF PROCEDURE:  02/06/2015 DATE OF DISCHARGE:                              OPERATIVE REPORT   PREOPERATIVE DIAGNOSIS:  Postop lumbar superficial infection.  POSTOPERATIVE DIAGNOSIS:  Postop lumbar superficial infection.  PROCEDURES:  Excisional debridement, superficial subcutaneous lumbar incision.  Placement of Stimulan beads with vancomycin and small VAC application.  SURGEON:  Ayaana Biondo C. Lorin Mercy, M.D.  ANESTHESIA:  General.  INDICATIONS:  This is a 70 year old female who had previous lumbar 2 level decompression on January 17, 2015.  Postoperatively, she had some problems with skin from tape burn.  She previously had been on Humira, but had stopped this 2 weeks prior to the surgery.  Postoperatively, she had some drainage from the wound and then had skin necrosis for 2-3 cm. The patient had weight of 94 kg in the lumbar area of L3-4 and L4-5, would fold over in the patient's sitting or supine position.  She had been changing the dressing and the wound dehisced open 5-6 mm with visual Vicryl sutures present and edge skin necrosis.  DESCRIPTION OF PROCEDURE:  After induction of general anesthesia, orotracheal intubation, the patient was placed prone with standard prepping with DuraPrep.  The area was squared with towels.  Yellow pads were placed underneath the ulnar nerve and rolled up yellow pads anterior to the shoulder.  10/10 drapes were placed around.  DuraPrep was used.  Laminectomy sheet drapes were applied.  Time-out procedure was completed.  The patient was already getting vancomycin and Zosyn, so no additional antibiotics were given.  Sharp excision with a scalpel used to debride the whitish gray necrotic skin edges extended 1-2 cm. Scalpel was used  excising subcutaneous tissue.  In the subcutaneous tissue, there was an area about the size of a golf ball that had brownish necrotic looking fat necrosis.  This was excised sharply with a scalpel, irrigated, then continued debridement in all directions to make sure all necrotic fat was removed.  There were numerous small arterial bleeders present and venous bleeders present in the subcutaneous tissue with good vascularity of the fat.  The tissue extended down to the fascia.  There was no necrotic tissue on the fascia.  No necrotic fascia present.  Copious irrigation with pulsatile lavage was used.  Continued multiple sponges were used, curette, scalpel, pickups.  Rongeur was used for the sharp excisional debridement.  Multiple looks and intermittent irrigation and then re-looking until there was no remaining necrotic tissue present.  Foul odor was gone.  Before we started debriding once the initial opening was made, aerobic and anaerobic cultures were sent. Lab later called and said that the top had somehow come off the top of the anaerobic tube even though it was screwed down tightly and apparently they cannot guarantee reliability of this.  The patient had a culture obtained last night and had been on p.o. doxycycline for a week when she had a little bit of clear drainage from her incision and skin  edges did not show any necrosis.  Dry sponges were placed and then careful looking.  One of the sutures were removed from the fascia looking down below.  There was no area of involvement down the muscle, and the muscle looked good.  I did not see any necrosis.  No drainage.  Pushing and pressure was applied, there was no serous fluid present.  This was repeat lavaged and then Stimulan beads were mixed, placed in after 3 L saline had been irrigated, and #2 Vicryl was used for far-near near-far suture with 2 sutures placed distally, 1 placed proximally and this left the remaining area where  the skin necrosis with room for a small VAC sponge, which was placed down in it and then sealed with tincture of benzoin, Betadine, and Steri-Drape tucking into the skin fold and then hooking the VAC.  There was excellent seal.  No leakage.  ABD was applied over the top of the tubing for padding, and the patient was transferred to the recovery room. Instrument count and needle count were correct.  The patient tolerated the procedure well.     Jawad Wiacek C. Lorin Mercy, M.D.     MCY/MEDQ  D:  02/06/2015  T:  02/06/2015  Job:  [id number]

## 2015-02-06 NOTE — Anesthesia Preprocedure Evaluation (Addendum)
Anesthesia Evaluation  Patient identified by MRN, date of birth, ID band Patient awake    Reviewed: Allergy & Precautions, NPO status , Patient's Chart, lab work & pertinent test results  Airway Mallampati: II  TM Distance: >3 FB Neck ROM: Full    Dental  (+) Teeth Intact, Dental Advisory Given   Pulmonary shortness of breath, sleep apnea and Continuous Positive Airway Pressure Ventilation , former smoker,    breath sounds clear to auscultation       Cardiovascular hypertension, Pt. on medications + dysrhythmias + Valvular Problems/Murmurs  Rhythm:Regular Rate:Normal     Neuro/Psych    GI/Hepatic negative GI ROS, Neg liver ROS,   Endo/Other  negative endocrine ROS  Renal/GU negative Renal ROS     Musculoskeletal  (+) Arthritis ,   Abdominal   Peds  Hematology   Anesthesia Other Findings   Reproductive/Obstetrics                           Anesthesia Physical Anesthesia Plan  ASA: III  Anesthesia Plan: General   Post-op Pain Management:    Induction: Intravenous  Airway Management Planned: Oral ETT  Additional Equipment:   Intra-op Plan:   Post-operative Plan: Extubation in OR  Informed Consent: I have reviewed the patients History and Physical, chart, labs and discussed the procedure including the risks, benefits and alternatives for the proposed anesthesia with the patient or authorized representative who has indicated his/her understanding and acceptance.   Dental advisory given  Plan Discussed with: CRNA and Anesthesiologist  Anesthesia Plan Comments:         Anesthesia Quick Evaluation

## 2015-02-06 NOTE — Anesthesia Postprocedure Evaluation (Signed)
Anesthesia Post Note  Patient: [name]  Procedure(s) Performed: Procedure(s) (LRB): I AND D LUMBAR WITH WOUND VAC PLACEMENT (N/A)  Patient location during evaluation: PACU Anesthesia Type: General Level of consciousness: awake Pain management: pain level controlled Vital Signs Assessment: post-procedure vital signs reviewed and stable Cardiovascular status: stable Anesthetic complications: no    Last Vitals:  Filed Vitals:   02/06/15 1130 02/06/15 1145  BP: 122/79 124/69  Pulse: 71 74  Temp:  37 C  Resp: 11 9    Last Pain:  Filed Vitals:   02/06/15 1217  PainSc: 6                  EDWARDS,Adryanna Friedt

## 2015-02-07 ENCOUNTER — Encounter (HOSPITAL_COMMUNITY): Payer: Self-pay | Admitting: Orthopaedic Surgery

## 2015-02-07 DIAGNOSIS — B9689 Other specified bacterial agents as the cause of diseases classified elsewhere: Secondary | ICD-10-CM

## 2015-02-07 DIAGNOSIS — T814XXA Infection following a procedure, initial encounter: Principal | ICD-10-CM

## 2015-02-07 DIAGNOSIS — M069 Rheumatoid arthritis, unspecified: Secondary | ICD-10-CM

## 2015-02-07 DIAGNOSIS — S31000A Unspecified open wound of lower back and pelvis without penetration into retroperitoneum, initial encounter: Secondary | ICD-10-CM

## 2015-02-07 DIAGNOSIS — Y838 Other surgical procedures as the cause of abnormal reaction of the patient, or of later complication, without mention of misadventure at the time of the procedure: Secondary | ICD-10-CM

## 2015-02-07 LAB — CBC
HCT: 29.5 % — ABNORMAL LOW (ref 36.0–46.0)
Hemoglobin: 9.4 g/dL — ABNORMAL LOW (ref 12.0–15.0)
MCH: 29.2 pg (ref 26.0–34.0)
MCHC: 31.9 g/dL (ref 30.0–36.0)
MCV: 91.6 fL (ref 78.0–100.0)
Platelets: 325 10*3/uL (ref 150–400)
RBC: 3.22 MIL/uL — ABNORMAL LOW (ref 3.87–5.11)
RDW: 13.6 % (ref 11.5–15.5)
WBC: 9.1 10*3/uL (ref 4.0–10.5)

## 2015-02-07 NOTE — Progress Notes (Signed)
Subjective: Doing well.  Pain controlled.     Objective: Vital signs in last 24 hours: Temp:  [98 F (36.7 C)-98.7 F (37.1 C)] 98 F (36.7 C) (01/30 0406) Pulse Rate:  [65-82] 65 (01/30 0406) Resp:  [16] 16 (01/30 0406) BP: (63-106)/(36-62) 63/36 mmHg (01/30 1002) SpO2:  [98 %-100 %] 99 % (01/30 0406)  Intake/Output from previous day: 01/29 0701 - 01/30 0700 In: 1750 [I.V.:1500; IV Piggyback:250] Out: 50 [Blood:50] Intake/Output this shift: Total I/O In: 360 [P.O.:360] Out: -    Recent Labs  02/05/15 1932 02/07/15 0718  HGB 10.6* 9.4*    Recent Labs  02/05/15 1932 02/07/15 0718  WBC 12.0* 9.1  RBC 3.67* 3.22*  HCT 34.1* 29.5*  PLT 428* 325    Recent Labs  02/05/15 1932  NA 143  K 3.8  CL 101  CO2 26  BUN 20  CREATININE 1.10*  GLUCOSE 126*  CALCIUM 9.6    Recent Labs  02/05/15 1932  INR 1.12      2d ago    Specimen Description WOUND   Special Requests Normal   Gram Stain FEW WBC PRESENT,BOTH PMN AND MONONUCLEAR  NO SQUAMOUS EPITHELIAL CELLS SEEN  FEW GRAM POSITIVE COCCI IN PAIRS  FEW GRAM NEGATIVE RODS  Performed at [facility]       Culture Culture reincubated for better growth  Performed at [place] [id number]            1d ago    Specimen Description WOUND   Special Requests LUMBAR WOUND POF ZOSYN   Gram Stain FEW WBC RARE SQUAMOUS EPITHELIAL CELLS PRESENT  FEW GRAM POSITIVE COCCI IN PAIRS  Performed at [facility]       Culture NO GROWTH 1 DAY  Performed at [place] [id number]      Specimen Collected: 02/06/15 10:03 AM Last Resulted: 02/07/15 7:28 AM         Exam:  Alert and oriented.  Dressing c/d/i.  Wound vac working.    Assessment/Plan: Patient s/p I&D with wound vac placement yesterday.  Reviewed gram stain results with Dr Lorin Mercy.  Will get infectious  disease consult.     [name] M 02/07/2015, 11:49 AM

## 2015-02-07 NOTE — Therapy (Signed)
[facility] [place], [place], [address] Phone: [phone]   Fax:  [phone]  Physical Therapy Treatment  Patient Details  Name: [name] MRN: [id number] Date of Birth: 06-Feb-1945 No Data Recorded  Encounter Date: 05/19/2014    Past Medical History  Diagnosis Date  . Hypertension   . H/O cardiovascular stress test 02/2014    seen by Dr. Einar Gip - told that she was cleared for surgery  . Heart murmur   . Sleep apnea     CPAP in use- QO night, study done at [facility]- 2007   . Arthritis     Rheumatoid, followed by Dr. Estanislado Pandy, knees & ankles   . Anemia   . Shortness of breath dyspnea     with exertion    Past Surgical History  Procedure Laterality Date  . Abdominal hysterectomy    . Hand surgery    . Knee surgery  1990's   . Foot surgery Bilateral     bunionectomy, calluses , also has several pins & screws  . Carpal tunnel release Right   . Tubal ligation    . Joint replacement      planned for 03/25/2014  . Total knee arthroplasty Left 03/25/2014    Procedure: TOTAL KNEE ARTHROPLASTY;  Surgeon: Garald Balding, MD;  Location: [facility];  Service: Orthopedics;  Laterality: Left;  . Lumbar laminectomy/decompression microdiscectomy N/A 01/17/2015    Procedure: L2-3, L3-4 Decompression;  Surgeon: [name], MD;  Location: [facility];  Service: Orthopedics;  Laterality: N/A;  . Lumbar laminectomy/decompression microdiscectomy N/A 02/06/2015    Procedure: I AND D LUMBAR WITH WOUND VAC PLACEMENT;  Surgeon: [name], MD;  Location: [facility];  Service: Orthopedics;  Laterality: N/A;    There were no vitals filed for this visit.  Visit Diagnosis:  Left knee pain - Plan: PT plan of care cert/re-cert  Knee stiffness, left - Plan: PT plan of care cert/re-cert                                 PT Short Term Goals - 05/19/14 0841    PT SHORT TERM GOAL #1   Title Ind with initial HEP.   Time 2    Period Weeks   Status Achieved           PT Long Term Goals - 05/19/14 0841    PT LONG TERM GOAL #1   Title Ind with advanced HEP.   Time 8   Period Weeks   Status Achieved   PT LONG TERM GOAL #2   Title Active left knee flexion to 115 degrees+.   Time 8   Period Weeks   Status On-going   PT LONG TERM GOAL #3   Title 5/5 left LE strength.   Time 8   Period Weeks   Status Achieved   PT LONG TERM GOAL #4   Title Perform all ADL's with pain not > 2/10   Time 8   Period Weeks   Status Achieved               Problem List Patient Active Problem List   Diagnosis Date Noted  . 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 Post op infection 02/05/2015  . History of lumbar laminectomy for spinal cord decompression 01/17/2015  . Lumbar spinal stenosis 01/17/2015  . Primary osteoarthritis of left knee 03/25/2014  .  Rheumatoid arthritis of knee ([place]) 03/25/2014  . 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 DERMATITIS 09/28/2008  . CONSTIPATION 05/25/2008  . DYSPNEA ON EXERTION 05/25/2008  . ANEMIA, NORMOCYTIC 04/13/2008  . ANKLE PAIN, BILATERAL 11/25/2007  . [place] DISEASE 08/15/2006  . HYPERLIPIDEMIA 08/14/2006  . MORBID OBESITY 12/13/2005  . CARPAL TUNNEL SYNDROME 12/13/2005  . PERIPHERAL NEUROPATHY 12/13/2005  . HYPERTENSION 12/13/2005  . DYSRHYTHMIA, CARDIAC NOS 12/13/2005   PHYSICAL THERAPY DISCHARGE SUMMARY  Visits from Start of Care: 12  Current functional level related to goals / functional outcomes: Please see above.   Remaining deficits: Goal #2 unmet.   Education / Equipment: HEP. Plan: Patient agrees to discharge.  Patient goals were partially met. Patient is being discharged due to meeting the stated rehab goals.  ?????       Shavana Calder, [place] MPT 02/07/2015, 6:01 PM  [facility] [address] [place], [place], [address] Phone: [phone]   Fax:   [phone]  Name: [name] MRN: [id number] Date of Birth: [birth year]

## 2015-02-07 NOTE — Progress Notes (Addendum)
Subjective: 1 Day Post-Op Procedure(s) (LRB): I AND D LUMBAR WITH WOUND VAC PLACEMENT (N/A) Patient reports pain as moderate and severe.  Got orthotic when she stood up due to pain. Got bolus  Objective: Vital signs in last 24 hours: Temp:  [98 F (36.7 C)-98.7 F (37.1 C)] 98.5 F (36.9 C) (01/30 1414) Pulse Rate:  [65-82] 79 (01/30 1414) Resp:  [16-18] 18 (01/30 1414) BP: (63-106)/(36-62) 105/59 mmHg (01/30 1414) SpO2:  [98 %-100 %] 98 % (01/30 1414)  Intake/Output from previous day: 01/29 0701 - 01/30 0700 In: 1750 [I.V.:1500; IV Piggyback:250] Out: 50 [Blood:50] Intake/Output this shift: Total I/O In: 600 [P.O.:600] Out: -    Recent Labs  02/05/15 1932 02/07/15 0718  HGB 10.6* 9.4*    Recent Labs  02/05/15 1932 02/07/15 0718  WBC 12.0* 9.1  RBC 3.67* 3.22*  HCT 34.1* 29.5*  PLT 428* 325    Recent Labs  02/05/15 1932  NA 143  K 3.8  CL 101  CO2 26  BUN 20  CREATININE 1.10*  GLUCOSE 126*  CALCIUM 9.6    Recent Labs  02/05/15 1932  INR 1.12    right AT is stronger than it was last week, continuing to improve. VAC some blood .   Assessment/Plan: 1 Day Post-Op Procedure(s) (LRB): I AND D LUMBAR WITH WOUND VAC PLACEMENT (N/A) Continue ABX therapy due to Post-op infection     ID note pending. Cont Deniece Ree Lajean Silvius. Increase IV rate I will come by at lunchtime for [facility] removal and wound check  Nurse can premed with pain meds.  Lavin Petteway C 02/07/2015, 2:44 PM

## 2015-02-07 NOTE — Consult Note (Addendum)
WOC consult requested to change Vac dressing on Tuesday. WOC will re-apply Vac dressing at 1:00 on Tuesday after Dr Lorin Mercy comes in to remove the dressing and assesses the wound. Julien Girt MSN, RN, [place], [place], [place]

## 2015-02-07 NOTE — Progress Notes (Signed)
Utilization review completed.  

## 2015-02-07 NOTE — Consult Note (Signed)
[place] for Infectious Disease       Reason for Consult: post op infection   Referring Physician: Dr. Lorin Mercy  Active Problems:   Post op infection   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[place])   . docusate sodium  100 mg Oral Daily  . ferrous sulfate  325 mg Oral Q breakfast  . folic acid  2 mg Oral Daily  . hydrochlorothiazide  25 mg Oral Daily  . multivitamin with minerals  1 tablet Oral Daily  . piperacillin-tazobactam (ZOSYN)  IV  3.375 g Intravenous 3 times per day  . polyethylene glycol  17 g Oral Daily  . ramipril  2.5 mg Oral Daily  . vancomycin  1,500 mg Intravenous Q24H    Recommendations: Continue with vancomycin and zosyn Will watch culture  Will consider picc line tomorrow unless there is a positive culture amenable to oral therapy Would treat for 3-4 weeks  Assessment: She has a post op lumbar infection that is superficial to the fascia with necrotic fat tissue, s/p debridement.  Cultures pending but done on antibiotics and also possibly contaminated  RA - on Humira every 2 weeks  Antibiotics: Vancomycin and zosyn Doxycycline for 1 weeks  HPI: [name] is a 70 y.o. 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  She had some persistent drainage after sugery and then developed more drainage with foul smell and necrotic edges and taken to the OR 02/06/15 for I and D. Noted fat necrosis superficial to the fascia and no necrosis of the fascia.  Cultures sent and ngtd.  Had been on doxycycline for 1 week prior to the surgery and was on vancomycin and zosyn.  No fever and no chills. Previous hospitalization and TKA reviewed.    Review of Systems:  Constitutional: negative for fevers and chills Gastrointestinal: negative for nausea and diarrhea Integument/breast:  negative for rash All other systems reviewed and are negative   Past Medical History  Diagnosis Date  . Hypertension   . H/O cardiovascular stress test 02/2014    seen by Dr. Einar Gip - told that she was cleared for surgery  . Heart murmur   . Sleep apnea     CPAP in use- QO night, study done at [facility]- 2007   . Arthritis     Rheumatoid, followed by Dr. Estanislado Pandy, knees & ankles   . Anemia   . Shortness of breath dyspnea     with exertion    Social History  Substance Use Topics  . Smoking status: Former Smoker    Quit date: 03/16/1980  . Smokeless tobacco: Never Used  . Alcohol Use: No    FMHx: no rheumatoid arthritis  No Known Allergies  Physical Exam: Constitutional: in no apparent distress and alert  Filed Vitals:   02/07/15 1328 02/07/15 1414  BP: 81/53 105/59  Pulse:  79  Temp:  98.5 F (36.9 C)  Resp:  18   EYES: anicteric ENMT: no thrush Cardiovascular: Cor RRR Respiratory: CTA B; normal respitaroy effort GI: Bowel sounds are normal, liver is not enlarged, spleen is not enlarged Musculoskeletal: no pedal edema noted Skin: negatives: no rash Hematologic: no cervical lad  Lab Results  Component Value Date   WBC 9.1 02/07/2015   HGB 9.4* 02/07/2015   HCT 29.5* 02/07/2015  MCV 91.6 02/07/2015   PLT 325 02/07/2015    Lab Results  Component Value Date   CREATININE 1.10* 02/05/2015   BUN 20 02/05/2015   NA 143 02/05/2015   K 3.8 02/05/2015   CL 101 02/05/2015   CO2 26 02/05/2015    Lab Results  Component Value Date   ALT 15 02/05/2015   AST 19 02/05/2015   ALKPHOS 87 02/05/2015     Microbiology: Recent Results (from the past 240 hour(s))  MRSA PCR Screening     Status: None   Collection Time: 02/05/15  7:44 PM  Result Value Ref Range Status   MRSA by PCR NEGATIVE NEGATIVE Final    Comment:        The GeneXpert MRSA Assay (FDA approved for NASAL specimens only), is one component of a comprehensive MRSA colonization surveillance program. It  is not intended to diagnose MRSA infection nor to guide or monitor treatment for MRSA infections.   Wound culture     Status: None (Preliminary result)   Collection Time: 02/05/15  7:45 PM  Result Value Ref Range Status   Specimen Description WOUND  Final   Special Requests Normal  Final   Gram Stain   Final    FEW WBC PRESENT,BOTH PMN AND MONONUCLEAR NO SQUAMOUS EPITHELIAL CELLS SEEN FEW GRAM POSITIVE COCCI IN PAIRS FEW GRAM NEGATIVE RODS Performed at [facility]    Culture   Final    Culture reincubated for better growth Performed at [facility]    Report Status PENDING  Incomplete  Wound culture     Status: None (Preliminary result)   Collection Time: 02/06/15 10:03 AM  Result Value Ref Range Status   Specimen Description WOUND  Final   Special Requests LUMBAR WOUND POF ZOSYN  Final   Gram Stain   Final    FEW WBC RARE SQUAMOUS EPITHELIAL CELLS PRESENT FEW GRAM POSITIVE COCCI IN PAIRS Performed at [facility]    Culture   Final    NO GROWTH 1 DAY Performed at [facility]    Report Status PENDING  Incomplete    COMER, Herbie [place], [facility] [email or web address] [phone] pager  [phone] cell 02/07/2015, 2:54 PM

## 2015-02-07 NOTE — Evaluation (Signed)
Physical Therapy Evaluation Patient Details Name: [name] MRN: [id number] DOB: 12/06/1945 Today's Date: 02/07/2015   History of Present Illness  70 yo admitted for L2-4 spinal decompression with dural tear 01/17/15 and returned 02/05/15 with infection s/p I&D with wound VAC. PMHx: L TKA, RA, HTN  Clinical Impression  Pt pleasant and familiar from prior admission. Pt with improved bed mobility but limited by hypotension today. Pt with BP 63/39 (48) sitting with cues for bil LE movement and after 3 min improvement to 82/51 (62) with RN present and aware end of session. Pt with decreased mobility, transfers and gait who will benefit from acute therapy to maximize mobility, function and safety.     Follow Up Recommendations Home health PT;Supervision/Assistance - 24 hour    Equipment Recommendations  None recommended by PT    Recommendations for Other Services       Precautions / Restrictions Precautions Precautions: Fall;Back Precaution Comments: watch bp Restrictions Weight Bearing Restrictions: No      Mobility  Bed Mobility Overal bed mobility: Needs Assistance Bed Mobility: Rolling;Sidelying to Sit Rolling: Supervision Sidelying to sit: Min assist       General bed mobility comments: cues for sequence with pt utilizing rail to roll to side and min assist to fully elevat trunk from surface  Transfers Overall transfer level: Needs assistance   Transfers: Sit to/from Stand;Stand Pivot Transfers Sit to Stand: Min assist         General transfer comment: cues for hand placement with increased assist to power up and rise from surfacex 2 trials. Pt pivoted bed to [facility] then complained of feeling faint. Pt stood from [facility] with [place] traded for recliner. BP in recliner 63/39  Ambulation/Gait Ambulation/Gait assistance:  (unable due to hypotension at this time)              Stairs            Wheelchair Mobility    Modified Rankin (Stroke Patients Only)        Balance                                             Pertinent Vitals/Pain Pain Score: 3  Pain Location: back Pain Descriptors / Indicators: Burning Pain Intervention(s): Limited activity within patient's tolerance;Monitored during session;Repositioned;Premedicated before session    Home Living Family/patient expects to be discharged to:: Private residence Living Arrangements: Spouse/significant other Available Help at Discharge: Family;Available 24 hours/day Type of Home: House Home Access: Stairs to enter Entrance Stairs-Rails: None Entrance Stairs-Number of Steps: 3 Home Layout: One level Home Equipment: Walker - 2 wheels;Bedside commode;Tub bench;Cane - single point Additional Comments: Pt has steps to get up onto bed as well    Prior Function Level of Independence: Needs assistance      ADL's / Homemaking Assistance Needed: spouse helping with ADLs since back sx  Comments: was walking in house with RW, relying on grocery cart for assist in stores.      Hand Dominance        Extremity/Trunk Assessment   Upper Extremity Assessment: Generalized weakness           Lower Extremity Assessment: Generalized weakness      Cervical / Trunk Assessment: Lordotic  Communication   Communication: No difficulties  Cognition Arousal/Alertness: Awake/alert Behavior During Therapy: Flat affect Overall Cognitive Status: Within Functional Limits for  tasks assessed                      General Comments      Exercises        Assessment/Plan    PT Assessment Patient needs continued PT services  PT Diagnosis Difficulty walking;Acute pain;Generalized weakness   PT Problem List Decreased strength;Decreased activity tolerance;Decreased balance;Pain;Decreased knowledge of use of DME;Decreased mobility  PT Treatment Interventions Stair training;Functional mobility training;Therapeutic activities;Therapeutic exercise;Gait training;DME  instruction;Patient/family education   PT Goals (Current goals can be found in the Care Plan section) Acute Rehab PT Goals Patient Stated Goal: walk in the grocery store without leaning on the cart PT Goal Formulation: With patient Time For Goal Achievement: 02/14/15 Potential to Achieve Goals: Good    Frequency Min 3X/week   Barriers to discharge        Co-evaluation               End of Session Equipment Utilized During Treatment: Gait belt Activity Tolerance: Treatment limited secondary to medical complications (Comment) Patient left: in chair;with call bell/phone within reach;with nursing/sitter in room Nurse Communication: Mobility status;Precautions         Time: [id number] PT Time Calculation (min) (ACUTE ONLY): 21 min   Charges:   PT Evaluation $PT Eval Moderate Complexity: 1 Procedure     PT G CodesMelford Aase 02/07/2015, 10:07 AM Elwyn Reach, [place]

## 2015-02-08 LAB — CBC
HCT: 27.3 % — ABNORMAL LOW (ref 36.0–46.0)
Hemoglobin: 9 g/dL — ABNORMAL LOW (ref 12.0–15.0)
MCH: 30.2 pg (ref 26.0–34.0)
MCHC: 33 g/dL (ref 30.0–36.0)
MCV: 91.6 fL (ref 78.0–100.0)
Platelets: 320 10*3/uL (ref 150–400)
RBC: 2.98 MIL/uL — ABNORMAL LOW (ref 3.87–5.11)
RDW: 13.7 % (ref 11.5–15.5)
WBC: 9.6 10*3/uL (ref 4.0–10.5)

## 2015-02-08 LAB — CREATININE, SERUM
Creatinine, Ser: 1.03 mg/dL — ABNORMAL HIGH (ref 0.44–1.00)
GFR calc Af Amer: >60 mL/min (ref >60)
GFR calc non Af Amer: 54 mL/min — ABNORMAL LOW (ref >60)

## 2015-02-08 LAB — WOUND CULTURE

## 2015-02-08 MED ORDER — PRO-STAT SUGAR FREE PO LIQD
30.0000 mL | Freq: Two times a day (BID) | ORAL | Status: DC
  Administered 2015-02-08 – 2015-02-11 (×4): 30 mL via ORAL
  Filled 2015-02-08 (×4): qty 30

## 2015-02-08 MED ORDER — AMPICILLIN-SULBACTAM SODIUM 3 (2-1) G IJ SOLR
3.0000 g | Freq: Four times a day (QID) | INTRAMUSCULAR | Status: DC
  Administered 2015-02-08 – 2015-02-11 (×12): 3 g via INTRAVENOUS
  Filled 2015-02-08 (×15): qty 3

## 2015-02-08 NOTE — Progress Notes (Signed)
Pharmacy Antibiotic Note  [name] [name] is a 70 y.o. female admitted on 02/05/2015 with a post-op lumbar wound infection. On D#4 antibiotics  The patient is now s/p I&D with VAC placement on 1/29. Intra-op cx grew multiple organisms but no staph aureus. ID recommended narrowing to unasyn.   Plan: - Unasyn 3g IV Q 6 hrs - Will continue to follow renal function, culture results, LOT, and antibiotic de-escalation plans   Height: 5' (152.4 cm) Weight: 207 lb 4.8 oz (94.031 kg) IBW/kg (Calculated) : 45.5  Temp (24hrs), Avg:98.3 F (36.8 C), Min:98.3 F (36.8 C), Max:98.4 F (36.9 C)   Recent Labs Lab 02/05/15 1932 02/07/15 0718 02/08/15 0725 02/08/15 1108  WBC 12.0* 9.1 9.6  --   CREATININE 1.10*  --   --  1.03*    Estimated Creatinine Clearance: 52.8 mL/min (by C-G formula based on Cr of 1.03).    No Known Allergies  Antimicrobials this admission: Doxy PTA Vanc 1/28 >> 1/31 Zosyn 1/28 >> 1/31 Unasyn 1/31  Dose adjustments this admission: n/a  Microbiology results: 1/28 MRSA PCR >> neg 1/28 WCx (superficial) >> multiple organisms, none predominant 1/29 WCx (intra-op) >> multiple organisms, none predominant  Thank you for allowing pharmacy to be a part of this patient's care.  Maryanna Shape, PharmD, BCPS  Clinical Pharmacist  Pager: [phone]   02/08/2015 3:03 PM

## 2015-02-08 NOTE — Progress Notes (Signed)
Subjective: 2 Days Post-Op Procedure(s) (LRB): I AND D LUMBAR WITH WOUND VAC PLACEMENT (N/A) Patient reports pain as moderate.  Just got IV med.    Objective: Vital signs in last 24 hours: Temp:  [98.3 F (36.8 C)-98.5 F (36.9 C)] 98.3 F (36.8 C) (01/31 0602) Pulse Rate:  [67-80] 67 (01/31 0602) Resp:  [18-19] 18 (01/31 0602) BP: (81-106)/(53-65) 103/65 mmHg (01/31 0602) SpO2:  [98 %-100 %] 100 % (01/31 0602)  Intake/Output from previous day: 01/30 0701 - 01/31 0700 In: 2940 [P.O.:840; I.V.:1500; IV Piggyback:600] Out: 50 [Drains:50] Intake/Output this shift: Total I/O In: 360 [P.O.:360] Out: -    Recent Labs  02/05/15 1932 02/07/15 0718 02/08/15 0725  HGB 10.6* 9.4* 9.0*    Recent Labs  02/07/15 0718 02/08/15 0725  WBC 9.1 9.6  RBC 3.22* 2.98*  HCT 29.5* 27.3*  PLT 325 320    Recent Labs  02/05/15 1932 02/08/15 1108  NA 143  --   K 3.8  --   CL 101  --   CO2 26  --   BUN 20  --   CREATININE 1.10* 1.03*  GLUCOSE 126*  --   CALCIUM 9.6  --     Recent Labs  02/05/15 1932  INR 1.12    no change still trace right AT weakness.   Assessment/Plan: 2 Days Post-Op Procedure(s) (LRB): I AND D LUMBAR WITH WOUND VAC PLACEMENT (N/A) Plan:       VAC REMOVED. ABSORBABLE ANTIBIOTIC BEADS IN WOUND BED. NO NECROTIC TISSUE. NO PUS, NO ODOR.      CULTURES MULTIPLE ORGANISMS.   CONTINUE IV ABX , VAC CHANGE Friday  THEN POSSIBLE HOME ON ? PO ABX. ( POSSIBLE IV)  Zyairah Wacha C 02/08/2015, 12:40 PM

## 2015-02-08 NOTE — Care Management Important Message (Signed)
Important Message  Patient Details  Name: BYRLE SUNDERMEYER MRN: [id number] Date of Birth: [birth year]   Medicare Important Message Given:  Yes    Barb Merino Kmari Brian 02/08/2015, 3:29 PM

## 2015-02-08 NOTE — Progress Notes (Signed)
Dressing to lumbar wound removed by orthopedic surgeon. Wound bed red. Minimal sanguinous drainage from site. Measures 9cm in length, 5cm width and 3cm depth. Periwound skin clean, dry and intact. No foul odor noted.  One piece of foam to fill wound space. Wound vac suction applied and set at 100.  ABD pad and tape applied over wound vac dressing in order to protect the dressing from friction.  Will continue to monitor wound status.

## 2015-02-08 NOTE — Progress Notes (Signed)
[place] [facility] for Infectious Disease   Reason for visit: Follow up on post op wound infection  Interval History: VAC removed, patient lying on front.  No necrotic tissue noted, no pus, no odor.  Culture shows multiple organisms no Staph or GAS.   Physical Exam: Constitutional:  Filed Vitals:   02/08/15 0602 02/08/15 1300  BP: 103/65 118/62  Pulse: 67 77  Temp: 98.3 F (36.8 C) 98.3 F (36.8 C)  Resp: 18 18   patient appears in NAD, lying on front Respiratory: Normal respiratory effort; CTA B Cardiovascular: RRR Skin: no rashes  Review of Systems: Constitutional: negative for fevers and chills Cardiovascular: negative for chest pain Gastrointestinal: negative for nausea and diarrhea  Lab Results  Component Value Date   WBC 9.6 02/08/2015   HGB 9.0* 02/08/2015   HCT 27.3* 02/08/2015   MCV 91.6 02/08/2015   PLT 320 02/08/2015    Lab Results  Component Value Date   CREATININE 1.03* 02/08/2015   BUN 20 02/05/2015   NA 143 02/05/2015   K 3.8 02/05/2015   CL 101 02/05/2015   CO2 26 02/05/2015    Lab Results  Component Value Date   ALT 15 02/05/2015   AST 19 02/05/2015   ALKPHOS 87 02/05/2015     Microbiology: Recent Results (from the past 240 hour(s))  MRSA PCR Screening     Status: None   Collection Time: 02/05/15  7:44 PM  Result Value Ref Range Status   MRSA by PCR NEGATIVE NEGATIVE Final    Comment:        The GeneXpert MRSA Assay (FDA approved for NASAL specimens only), is one component of a comprehensive MRSA colonization surveillance program. It is not intended to diagnose MRSA infection nor to guide or monitor treatment for MRSA infections.   Wound culture     Status: None   Collection Time: 02/05/15  7:46 PM  Result Value Ref Range Status   Specimen Description WOUND  Final   Special Requests LUMBAR  Final   Gram Stain   Final    FEW WBC PRESENT,BOTH PMN AND MONONUCLEAR NO SQUAMOUS EPITHELIAL CELLS SEEN FEW GRAM POSITIVE COCCI IN  PAIRS FEW GRAM NEGATIVE RODS Performed at [facility]    Culture   Final    MULTIPLE ORGANISMS PRESENT, NONE PREDOMINANT Note: NO STAPHYLOCOCCUS AUREUS ISOLATED NO GROUP A STREP (S.PYOGENES) ISOLATED Performed at [facility]    Report Status 02/08/2015 FINAL  Final  Wound culture     Status: None   Collection Time: 02/06/15 10:03 AM  Result Value Ref Range Status   Specimen Description WOUND BACK SPEC A SURGICAL COLLECTION  Final   Special Requests LUMBAR WOUND POF ZOSYN  Final   Gram Stain   Final    FEW WBC RARE SQUAMOUS EPITHELIAL CELLS PRESENT FEW GRAM POSITIVE COCCI IN PAIRS Performed at [facility]    Culture   Final    MULTIPLE ORGANISMS PRESENT, NONE PREDOMINANT Note: NO STAPHYLOCOCCUS AUREUS ISOLATED NO GROUP A STREP (S.PYOGENES) ISOLATED Performed at [facility]    Report Status 02/08/2015 FINAL  Final    Impression/Plan:  1. Post op wound infection, superficial - s/p debridement and VAC placement.  No erythema or pus noted now when changing VAC per Dr. Lorin Mercy.  Culture noted and no Staph, has multiple organisms. Will narrow antibiotics with no Staph noted, multiple organisms.  Dr. Baxter Flattery to follow up tomorrow  2. Rheumatoid arthritis - on immunosuppressives.

## 2015-02-08 NOTE — Care Management Note (Addendum)
Case Management Note  Patient Details  Name: [name] MRN: [id number] Date of Birth: 01-13-1945  Subjective/Objective:      Admitted with lumbar postop infection, s/p I and D with wound VAC application               Action/Plan: Spoke with patient about discharge plan, she selected [facility] for RN visits for [facility] wound care. Informed patient that PT recommended HHPT but Dr. Lorin Mercy does not anticipate that she will required HHPT. We will continue to monitor her progress. Contacted [name] with [facility], she will let me know if they are able to service patient. Contacted [name] at [facility] and gave her required information to obtain [facility] for home. Will send her wound measurements after dressing change today. Will continue to follow patient for discharge needs.  Received call from [facility] with [facility], they will not be able to service patient. Spoke with patient, she chose [facility]. Contacted Susan at [facility] and [facility] up [facility] for [facility] care, will let them know if patient needs home IV antibiotics. Will continue to follow.     Expected Discharge Date:                  Expected Discharge Plan:  [place]  In-House Referral:  NA  Discharge planning Services  CM Consult  Post Acute Care Choice:  Home Health Choice offered to:  Patient  DME Arranged:    DME Agency:     HH Arranged:  RN [place] Agency:  [place]  Status of Service:  In process, will continue to follow  Medicare Important Message Given:    Date Medicare IM Given:    Medicare IM give by:    Date Additional Medicare IM Given:    Additional Medicare Important Message give by:     If discussed at [place] of Stay Meetings, dates discussed:    Additional Comments:  Brittney Nephew, RN 02/08/2015, 10:58 AM

## 2015-02-08 NOTE — Progress Notes (Signed)
Spoke with [place]. She stated that Dr. Lorin Mercy would come and remove wound vac today. She stated that she would be around to replace the vac at 1pm.  This nurse asked if she could replace the wound vac dressing since I am the wound treatment associate on this unit and have recently been checked off in this skill. Dawn stated that was ok and that she would check back in with me around 1pm. Will continue to monitor patient.

## 2015-02-08 NOTE — Progress Notes (Signed)
Initial Nutrition Assessment  DOCUMENTATION CODES:   Morbid obesity  INTERVENTION:  Provide 30 ml Prostat po BID, each supplement provides 100 kcal and 15 grams of protein.   Encourage adequate PO intake.   NUTRITION DIAGNOSIS:   Increased nutrient needs related to wound healing as evidenced by estimated needs.  GOAL:   Patient will meet greater than or equal to 90% of their needs  MONITOR:   PO intake, Supplement acceptance, Weight trends, Labs, I & O's  REASON FOR ASSESSMENT:   Consult Wound healing  ASSESSMENT:   71 yo admitted for L2-4 spinal decompression with dural tear 01/17/15 and returned 02/05/15 with infection s/p I&D with wound VAC. PMHx: L TKA, RA, HTN  Procedure (1/31): I AND D LUMBAR WITH WOUND VAC PLACEMENT (N/A)  Meal completion has been varied from 30-100%. Pt reports appetite is fine currently and PTA with consumption of at least 3 meals a day with no other difficulties. Usual body weight reported to be ~219 lbs. Pt with a 5.5% weight loss from usual body weight. Pt is agreeable to nutritional supplements to aid in wound healing. RD to order Prostat. Pt with no observed significant fat or muscle mass loss.   Labs and medications reviewed.   Diet Order:  Diet regular Room service appropriate?: Yes; Fluid consistency:: Thin  Skin:   (Incision on back with wound VAC)  Last BM:  1/29  Height:   Ht Readings from Last 1 Encounters:  02/05/15 5' (1.524 m)    Weight:   Wt Readings from Last 1 Encounters:  02/05/15 207 lb 4.8 oz (94.031 kg)    Ideal Body Weight:  45.45 kg  BMI:  Body mass index is 40.49 kg/(m^2).  Estimated Nutritional Needs:   Kcal:  1800-2000  Protein:  95-115 grams  Fluid:  1.8 - 2 L/day  EDUCATION NEEDS:   No education needs identified at this time  Corrin Parker, MS, RD, LDN Pager # [phone] After hours/ weekend pager # [phone]

## 2015-02-08 NOTE — Consult Note (Signed)
WOC wound consult note Dr Lorin Mercy in earlier to assess back wound during first post-op dressing change to full thickness wound.  Bedside nurse is a WTA and she obtained wound measurements and re-applied Vac dressing to middle back wound.  Dr Lorin Mercy requests [place] assess wound on Friday with dressing change.  Cont suction on at 137mm with small amt red drainage in the cannister. Julien Girt MSN, RN, [place], [place], [place]

## 2015-02-08 NOTE — Progress Notes (Signed)
Pharmacy Antibiotic Note  [name] is a 70 y.o. female admitted on 02/05/2015 with a post-op lumbar wound infection. Pharmacy has been consulted for Vancomycin + Zosyn dosing.   The patient is now s/o I&D with placement of Vanc powder/beads and small VAC placement on 1/29. Intra-op cx taken but per ID note concerned about contamination. ID planning 4 weeks of IV abx pending cultures. Renal function has remained stable, SCr 1.03, CrCl~50 ml/min. Will continue the current doses for now.   Plan: 1. Continue Vancomycin 1500 mg IV every 24 hours 2. Continue Zosyn 3.375g IV every 8 hours 3. Will continue to follow renal function, culture results, LOT, and antibiotic de-escalation plans   Height: 5' (152.4 cm) Weight: 207 lb 4.8 oz (94.031 kg) IBW/kg (Calculated) : 45.5  Temp (24hrs), Avg:98.4 F (36.9 C), Min:98.3 F (36.8 C), Max:98.5 F (36.9 C)   Recent Labs Lab 02/05/15 1932 02/07/15 0718 02/08/15 0725  WBC 12.0* 9.1 9.6  CREATININE 1.10*  --   --     Estimated Creatinine Clearance: 49.5 mL/min (by C-G formula based on Cr of 1.1).    No Known Allergies  Antimicrobials this admission: Doxy PTA Vanc 1/28 >> Zosyn 1/28 >>  Dose adjustments this admission: n/a  Microbiology results: 1/28 MRSA PCR >> neg 1/28 WCx (superficial) >> multiple organisms, none predominant 1/29 WCx (intra-op) >> multiple organisms, none predominant  Thank you for allowing pharmacy to be a part of this patient's care.  Alycia Rossetti, PharmD, BCPS Clinical Pharmacist Pager: [phone] 02/08/2015 10:07 AM

## 2015-02-09 LAB — CBC
HCT: 28.8 % — ABNORMAL LOW (ref 36.0–46.0)
Hemoglobin: 9.3 g/dL — ABNORMAL LOW (ref 12.0–15.0)
MCH: 29.8 pg (ref 26.0–34.0)
MCHC: 32.3 g/dL (ref 30.0–36.0)
MCV: 92.3 fL (ref 78.0–100.0)
Platelets: 312 10*3/uL (ref 150–400)
RBC: 3.12 MIL/uL — ABNORMAL LOW (ref 3.87–5.11)
RDW: 13.8 % (ref 11.5–15.5)
WBC: 9.5 10*3/uL (ref 4.0–10.5)

## 2015-02-09 NOTE — Progress Notes (Signed)
Patient ID: [name], female   DOB: 1945/03/26, 70 y.o.   MRN: [id number] Working with PT. Plan d/c iv ABX on Friday. Home with Augmentin 875 mg po bid times 3 wks.   VAC changes at home 3X a week and ROV with Markas [place] in 2-3 wks.

## 2015-02-09 NOTE — Progress Notes (Signed)
Physical Therapy Treatment Patient Details Name: [name] MRN: [id number] DOB: [birth year] Today's Date: 02/09/2015    History of Present Illness 70 yo admitted for L2-4 spinal decompression with dural tear 01/17/15 and returned 02/05/15 with infection s/p I&D with wound VAC. PMHx: L TKA, RA, HTN    PT Comments    Much improved activity tolerance today compared to last session; Serial BPs as follows:   02/09/15 1420 02/09/15 1428 02/09/15 1452  Vital Signs (above times are approximate)  Pulse Rate 73 94 (!) 102  BP 117/72 mmHg 128/81 mmHg 124/79 mmHg  Patient Position (if appropriate) Sitting Standing Standing (after walking)   Noted likely for dc this Friday -- she is on track from a mobility standpoint; Would like to see if Youth-sized RW is more optimal for walking, and look at stair negotiation.   Follow Up Recommendations  Home health PT;Supervision/Assistance - 24 hour     Equipment Recommendations  Other (comment) (worth double-checking to see if her RW at home is Youth-sized)    Recommendations for Other Services       Precautions / Restrictions Precautions Precautions: Fall;Back Precaution Comments: watch bp (BP much more stable 2/1)    Mobility  Bed Mobility               General bed mobility comments: recieved pt in chair  Transfers Overall transfer level: Needs assistance Equipment used: Rolling walker (2 wheeled) Transfers: Sit to/from Stand Sit to Stand: Min assist         General transfer comment: Cues to scoot to Edge of cahir and use armrests; cues to control descent with stand to sit  Ambulation/Gait Ambulation/Gait assistance: Min guard;+2 safety/equipment Ambulation Distance (Feet): 40 Feet Assistive device: Rolling walker (2 wheeled) Gait Pattern/deviations: Step-through pattern;Decreased step length - right;Decreased step length - left;Decreased stride length Gait velocity: quite slow   General Gait Details: Short steps with  noted heavy reliance on RW for UE support; Cues to self-monitor for activity tolerance; Standard height RW slightly too tall for [name]   Financial trader Rankin (Stroke Patients Only)       Balance Overall balance assessment: Needs assistance           Standing balance-Leahy Scale: Poor (approaching Fair, but noted reliance on RW for support)                      Cognition Arousal/Alertness: Awake/alert Behavior During Therapy: WFL for tasks assessed/performed Overall Cognitive Status: Within Functional Limits for tasks assessed                      Exercises      General Comments General comments (skin integrity, edema, etc.): Briefly discussed using Back precautions for comfort with mobility      Pertinent Vitals/Pain Pain Assessment: No/denies pain Pain Intervention(s): Monitored during session    Home Living                      Prior Function            PT Goals (current goals can now be found in the care plan section) Acute Rehab PT Goals Patient Stated Goal: walk in the grocery store without leaning on the cart PT Goal Formulation: With patient Time For Goal Achievement: 02/14/15 Potential to Achieve Goals: Good Progress towards PT goals: Progressing  toward goals    Frequency       PT Plan Current plan remains appropriate    Co-evaluation             End of Session Equipment Utilized During Treatment: Gait belt Activity Tolerance: Patient tolerated treatment well Patient left: in chair;with call bell/phone within reach     Time: 1413-1437 PT Time Calculation (min) (ACUTE ONLY): 24 min  Charges:  $Gait Training: 8-22 mins $Therapeutic Activity: 8-22 mins                    G Codes:      Quin Hoop 02/09/2015, 3:01 PM  Roney [place], [place] Pager [phone] Office [phone]

## 2015-02-10 DIAGNOSIS — M869 Osteomyelitis, unspecified: Secondary | ICD-10-CM

## 2015-02-10 NOTE — Progress Notes (Signed)
[facility] for Infectious Disease  abtx day #6: amp/sub x 3rd day  Reason for visit: Follow up on post op wound infection  Interval History: remains afebrile, just took pain medication to help with back pain  Physical Exam: Constitutional:  Filed Vitals:   02/10/15 0631 02/10/15 1242  BP: 118/71 105/56  Pulse: 76 78  Temp: 98.6 F (37 C) 98.7 F (37.1 C)  Resp: 18 18   patient appears in NAD, lying on front Respiratory: Normal respiratory effort; CTA B Cardiovascular: RRR Skin: no rashes  Review of Systems: Constitutional: negative for fevers and chills Cardiovascular: negative for chest pain Gastrointestinal: negative for nausea and diarrhea  Lab Results  Component Value Date   WBC 9.5 02/09/2015   HGB 9.3* 02/09/2015   HCT 28.8* 02/09/2015   MCV 92.3 02/09/2015   PLT 312 02/09/2015    Lab Results  Component Value Date   CREATININE 1.03* 02/08/2015   BUN 20 02/05/2015   NA 143 02/05/2015   K 3.8 02/05/2015   CL 101 02/05/2015   CO2 26 02/05/2015    Lab Results  Component Value Date   ALT 15 02/05/2015   AST 19 02/05/2015   ALKPHOS 87 02/05/2015     Microbiology: Recent Results (from the past 240 hour(s))  MRSA PCR Screening     Status: None   Collection Time: 02/05/15  7:44 PM  Result Value Ref Range Status   MRSA by PCR NEGATIVE NEGATIVE Final    Comment:        The GeneXpert MRSA Assay (FDA approved for NASAL specimens only), is one component of a comprehensive MRSA colonization surveillance program. It is not intended to diagnose MRSA infection nor to guide or monitor treatment for MRSA infections.   Wound culture     Status: None   Collection Time: 02/05/15  7:46 PM  Result Value Ref Range Status   Specimen Description WOUND  Final   Special Requests LUMBAR  Final   Gram Stain   Final    FEW WBC PRESENT,BOTH PMN AND MONONUCLEAR NO SQUAMOUS EPITHELIAL CELLS SEEN FEW GRAM POSITIVE COCCI IN PAIRS FEW GRAM NEGATIVE  RODS Performed at [facility]    Culture   Final    MULTIPLE ORGANISMS PRESENT, NONE PREDOMINANT Note: NO STAPHYLOCOCCUS AUREUS ISOLATED NO GROUP A STREP (S.PYOGENES) ISOLATED Performed at [facility]    Report Status 02/08/2015 FINAL  Final  Wound culture     Status: None   Collection Time: 02/06/15 10:03 AM  Result Value Ref Range Status   Specimen Description WOUND BACK SPEC A SURGICAL COLLECTION  Final   Special Requests LUMBAR WOUND POF ZOSYN  Final   Gram Stain   Final    FEW WBC RARE SQUAMOUS EPITHELIAL CELLS PRESENT FEW GRAM POSITIVE COCCI IN PAIRS Performed at [facility]    Culture   Final    MULTIPLE ORGANISMS PRESENT, NONE PREDOMINANT Note: NO STAPHYLOCOCCUS AUREUS ISOLATED NO GROUP A STREP (S.PYOGENES) ISOLATED Performed at [facility]    Report Status 02/08/2015 FINAL  Final    Impression/Plan:  1. Polymicrobial post op wound infection, superficial - s/p debridement and VAC placement.  No erythema or pus noted now when changing VAC per Dr. Lorin Mercy.  Culture noted and no Staph, has multiple organisms. Will continue on amp/sub until discharge. Can convert to amox/clav 875mg  BID x 3 wks for remaining part of course of treatment  2. Rheumatoid arthritis - on immunosuppressives.  Will see back in the ID clinic in 2-3 wk for follow up. Will sign off.

## 2015-02-10 NOTE — Progress Notes (Signed)
Physical Therapy Treatment Patient Details Name: [name] MRN: [id number] DOB: 1945/06/21 Today's Date: 02/10/2015    History of Present Illness 70 yo admitted for L2-4 spinal decompression with dural tear 01/17/15 and returned 02/05/15 with infection s/p I&D with wound VAC. PMHx: L TKA, RA, HTN    PT Comments    Pt with excellent progression with gait and activity tolerance today. No physical assist for transfers or gait today with improved speed. No lightheadedness and pt able to state precautions although difficulty with lack of bending with mobility due to prior habits of flexion.   Follow Up Recommendations  Home health PT;Supervision/Assistance - 24 hour     Equipment Recommendations       Recommendations for Other Services       Precautions / Restrictions Precautions Precautions: Back Precaution Comments: VAc    Mobility  Bed Mobility   Bed Mobility: Rolling;Sidelying to Sit Rolling: Modified independent (Device/Increase time) Sidelying to sit: Modified independent (Device/Increase time)       General bed mobility comments: use of rail with pt able to complete without assist  Transfers       Sit to Stand: Min guard         General transfer comment: cues for hand placement x 1 with 3 trials at bed, chair, BSC  Ambulation/Gait Ambulation/Gait assistance: Min guard Ambulation Distance (Feet): 60 Feet Assistive device: Rolling walker (2 wheeled) Gait Pattern/deviations: Step-through pattern;Decreased stride length;Trunk flexed   Gait velocity interpretation: at or above normal speed for age/gender General Gait Details: Short steps with forward flexion with cues for safety and upright posture, much faster gait speed today   Stairs Stairs: Yes Stairs assistance: Min assist Stair Management: Forwards;One rail Right;Step to pattern Number of Stairs: 5 General stair comments: cues for sequence with use of rail on right and hand held assist on  left  Wheelchair Mobility    Modified Rankin (Stroke Patients Only)       Balance                                    Cognition Arousal/Alertness: Awake/alert Behavior During Therapy: WFL for tasks assessed/performed Overall Cognitive Status: Within Functional Limits for tasks assessed                      Exercises      General Comments        Pertinent Vitals/Pain Pain Score: 4  Pain Location: back Pain Descriptors / Indicators: Aching;Burning Pain Intervention(s): Limited activity within patient's tolerance;Monitored during session;Premedicated before session;Repositioned    Home Living                      Prior Function            PT Goals (current goals can now be found in the care plan section) Progress towards PT goals: Progressing toward goals    Frequency       PT Plan Current plan remains appropriate    Co-evaluation             End of Session Equipment Utilized During Treatment: Gait belt Activity Tolerance: Patient tolerated treatment well Patient left: in chair;with call bell/phone within reach     Time: [id number] PT Time Calculation (min) (ACUTE ONLY): 23 min  Charges:  $Gait Training: 8-22 mins $Therapeutic Activity: 8-22 mins  G CodesMelford Aase 02-23-15, 9:02 AM Elwyn Reach, [place]

## 2015-02-11 MED ORDER — METHOCARBAMOL 500 MG PO TABS: 500.0000 mg | ORAL_TABLET | Freq: Four times a day (QID) | ORAL | Status: DC | PRN

## 2015-02-11 MED ORDER — AMOXICILLIN-POT CLAVULANATE 875-125 MG PO TABS: 1.0000 | ORAL_TABLET | Freq: Two times a day (BID) | ORAL | Status: DC

## 2015-02-11 MED ORDER — HYDROCODONE-ACETAMINOPHEN 10-325 MG PO TABS: 1.0000 | ORAL_TABLET | Freq: Four times a day (QID) | ORAL | Status: DC | PRN

## 2015-02-11 NOTE — Care Management Important Message (Signed)
Important Message  Patient Details  Name: [name] MRN: [id number] Date of Birth: 1945/04/14   Medicare Important Message Given:  Yes    Barb Merino Prudy Candy 02/11/2015, 4:19 PM

## 2015-02-11 NOTE — Consult Note (Signed)
WOC wound follow up Wound type: surgical, lumbar Measurement: see bedside nurse measurements from 02/08/15 Wound bed: ruddy, antibiotic beads, clean, no necrotic tissue noted Drainage (amount, consistency, odor) scant, serosanguinous in the canister Periwound: intact, sutures along the distal and proximal edges of the incision Dressing procedure/placement/frequency: Skin prep applied due to multiple skin folds.  Strips of hydrocolloid used in the skin folds.  1pc of black foam used to fill the wound.  Seal at 110mmHG.  Patient tolerated well, she did receive IV pain meds for dressing change and PO pain med at the same time.  Hooked to home unit for DC to home today.  Instructed patient on how to plug home unit into the wall for charging.  HHRN will need to see Monday for next dressing change.   Discussed POC with patient and bedside nurse.  Re consult if needed, will not follow at this time. Thanks  Mayo Owczarzak [place], [place] [phone])

## 2015-02-11 NOTE — Progress Notes (Signed)
Subjective: Doing well.  Pain controlled. Ready to d/c home.    Objective: Vital signs in last 24 hours: Temp:  [98.5 F (36.9 C)-98.7 F (37.1 C)] 98.5 F (36.9 C) (02/03 0445) Pulse Rate:  [72-76] 76 (02/03 0445) Resp:  [16-17] 16 (02/03 0445) BP: (95-112)/(56-62) 112/62 mmHg (02/03 0445) SpO2:  [100 %] 100 % (02/03 0445)  Intake/Output from previous day: 02/02 0701 - 02/03 0700 In: 780 [P.O.:780] Out: -  Intake/Output this shift: Total I/O In: 360 [P.O.:360] Out: -    Recent Labs  02/09/15 0611  HGB 9.3*    Recent Labs  02/09/15 0611  WBC 9.5  RBC 3.12*  HCT 28.8*  PLT 312   No results for input(s): NA, K, CL, CO2, BUN, CREATININE, GLUCOSE, CALCIUM in the last 72 hours. No results for input(s): LABPT, INR in the last 72 hours.  Exam:  Wound vac is on but there is a "leak alarm" going off.  bilat calves nontender.  Still has some right ant tib/ehl weakness. Alert and oriented.   Assessment/Plan: Will get wound care RN to check vac dressing.  D/c home today.  F/u with Dr Lorin Mercy 10-14 days. Scripts for percocet, robaxin and augmentin on chart.     [name] M 02/11/2015, 12:58 PM

## 2015-02-11 NOTE — Progress Notes (Signed)
Called by patient that her home wound vac alarming d/t dressing leak. I reinforced dressing  And the alarm stopped. Will continue to monitor patient. I paged the wound care nurse as well for consult. Waiting for her phone call back. Instructed patient to call me if it the alarm go off again.

## 2015-02-11 NOTE — Consult Note (Signed)
WOC contacted by ortho PA that VAC dressing was alarming leak.  When I arrived to the bedside patient is up in the chair, she reports that when she got up to the bathroom the VAC started to alarm shortly after that.  Unfortunatley with this wound in the lower lumbar region and this patient has multiple back skin folds, she is a large woman.  When she moves it places stress in the dressing and pulls the drape away in the skin folds. I have used strips of hydrocolloid in the skin folds to attempt to lessen the likelihood that the dressing will leak.  At my second visit to trouble shoot the dressing today I lined the TRAC pad on each side with hydrocollid and I applied a windowframe of drape.  When she sits back in the chair it again creates tension on the dressing/drape and seems to want to loosen on the right side.    I was able to get the dressing resealed and I have let the patient know that when she is up and about to DC home the dressing may pull.  She will need to contact [facility] RN over the weekend if she has problems with the dressing.  This will be a difficult dressing to maintain due to her skin folds.   Discussed POC with patient and bedside nurse.  Re consult if needed, will not follow at this time. Thanks  [name], [name] [phone])

## 2015-02-11 NOTE — Progress Notes (Signed)
Pharmacy Antibiotic Note  [name] is a 70 y.o. female admitted on 02/05/2015 with a post-op lumbar wound infection. On D#7 antibiotics. ID planning 4 weeks, to discharge home on Augmentin. Afeb, wbc wnl.  The patient is now s/p I&D with VAC placement on 1/29. Intra-op cx grew multiple organisms but no staph aureus. ID recommended narrowing to unasyn.   Plan: - Unasyn 3g IV Q 6 hrs - Will continue to follow renal function, culture results, LOT, and antibiotic de-escalation plans   Height: 5' (152.4 cm) Weight: 207 lb 4.8 oz (94.031 kg) IBW/kg (Calculated) : 45.5  Temp (24hrs), Avg:98.6 F (37 C), Min:98.5 F (36.9 C), Max:98.7 F (37.1 C)   Recent Labs Lab 02/05/15 1932 02/07/15 0718 02/08/15 0725 02/08/15 1108 02/09/15 0611  WBC 12.0* 9.1 9.6  --  9.5  CREATININE 1.10*  --   --  1.03*  --     Estimated Creatinine Clearance: 52.8 mL/min (by C-G formula based on Cr of 1.03).    No Known Allergies  Antimicrobials this admission: Doxy PTA Vanc 1/28 >> 1/31 Zosyn 1/28 >> 1/31 Unasyn 1/31 >> (home on Augmentin)  Dose adjustments this admission: n/a  Microbiology results: 1/28 MRSA PCR >> neg 1/28 WCx (superficial) >> multiple organisms, none predominant 1/29 WCx (intra-op) >> multiple organisms, none predominant  Elicia Lamp, PharmD, [facility] Clinical Pharmacist Pager [phone] 02/11/2015 11:28 AM

## 2015-02-16 NOTE — Discharge Summary (Signed)
Patient ID: [name] MRN: [id number] DOB/AGE: 03/23/1945 70 y.o.  Admit date: 01/17/2015 Discharge date: 02/16/2015  Admission Diagnoses:  Active Problems:   History of lumbar laminectomy for spinal cord decompression   Lumbar spinal stenosis   Discharge Diagnoses:  Active Problems:   History of lumbar laminectomy for spinal cord decompression   Lumbar spinal stenosis  status post Procedure(s): L2-3, L3-4 Decompression  Past Medical History  Diagnosis Date  . Hypertension   . H/O cardiovascular stress test 02/2014    seen by Dr. Einar Gip - told that she was cleared for surgery  . Heart murmur   . Sleep apnea     CPAP in use- QO night, study done at [facility]- 2007   . Arthritis     Rheumatoid, followed by Dr. Estanislado Pandy, knees & ankles   . Anemia   . Shortness of breath dyspnea     with exertion    Surgeries: Procedure(s): L2-3, L3-4 Decompression on 01/17/2015   Consultants:    Discharged Condition: Improved  Hospital Course: [name] is an 70 y.o. female who was admitted 01/17/2015 for operative treatment of lumbar infection. Patient failed conservative treatments (please see the history and physical for the specifics) and had severe unremitting pain that affects sleep, daily activities and work/hobbies. After pre-op clearance, the patient was taken to the operating room on 01/17/2015 and underwent  Procedure(s): L2-3, L3-4 Decompression.    Patient was given perioperative antibiotics:  Anti-infectives    Start     Dose/Rate Route Frequency Ordered Stop   01/18/15 0000  ceFAZolin (ANCEF) IVPB 1 g/50 mL premix     1 g 100 mL/hr over 30 Minutes Intravenous Every 8 hours 01/17/15 2159 01/18/15 0945       Patient was given sequential compression devices and early ambulation to prevent DVT.   Patient benefited maximally from hospital stay and there were no complications. At the time of discharge, the patient was urinating/moving their bowels without difficulty,  tolerating a regular diet, pain is controlled with oral pain medications and they have been cleared by PT/OT.   Recent vital signs: No data found.    Recent laboratory studies: No results for input(s): WBC, HGB, HCT, PLT, NA, K, CL, CO2, BUN, CREATININE, GLUCOSE, INR, CALCIUM in the last 72 hours.  Invalid input(s): PT, 2   Discharge Medications:     Medication List    STOP taking these medications        multivitamin with minerals Tabs tablet     traMADol 50 MG tablet  Commonly known as:  ULTRAM      TAKE these medications        ferrous sulfate 325 (65 FE) MG EC tablet  Take 325 mg by mouth daily with breakfast.     folic acid 1 MG tablet  Commonly known as:  FOLVITE  Take 2 mg by mouth daily.     HUMIRA PEN 40 MG/0.8ML Pnkt  Generic drug:  Adalimumab  Inject 40 mg as directed every 14 (fourteen) days. Reported on 02/05/2015     hydrochlorothiazide 25 MG tablet  Commonly known as:  HYDRODIURIL  Take 25 mg by mouth daily.     polyethylene glycol packet  Commonly known as:  MIRALAX / GLYCOLAX  Take 17 g by mouth daily.     ramipril 2.5 MG capsule  Commonly known as:  ALTACE  Take 2.5 mg by mouth daily.        Diagnostic Studies: Dg  Lumbar Spine 2-3 Views  01/17/2015  CLINICAL DATA:  Surgical localization imaging for lumbar spine decompression. EXAM: LUMBAR SPINE - 2-3 VIEW COMPARISON:  Lumbar MRI, 11/01/2014 FINDINGS: Two surgical needles have been inserted posteriorly. The more superior has its tip projecting 3.8 cm posterior to the lower posterior margin of the L2 vertebrae. The more inferior needle has its tip projecting 3.8 cm posterior to the posterior margin of the L3-L4 disc. IMPRESSION: Surgical localization imaging as described. Electronically Signed   By: Lajean Manes M.D.   On: 01/17/2015 15:25   Dg Abd 1 View  01/19/2015  CLINICAL DATA:  Pain and pressure in lower abdomen. EXAM: ABDOMEN - 1 VIEW COMPARISON:  None. FINDINGS: Portable supine view the  abdomen shows diffuse gaseous distention of large and small bowel. Stool is seen scattered along the length of the colon. The visualized bony anatomy is unremarkable. IMPRESSION: Diffuse gaseous bowel distention, compatible with ileus. Electronically Signed   By: Misty Stanley M.D.   On: 01/19/2015 11:54        Discharge Instructions    Call MD / Call 911    Complete by:  As directed   If you experience chest pain or shortness of breath, CALL 911 and be transported to the hospital emergency room.  If you develope a fever above 101 F, pus (white drainage) or increased drainage or redness at the wound, or calf pain, call your surgeon's office.     Constipation Prevention    Complete by:  As directed   Drink plenty of fluids.  Prune juice may be helpful.  You may use a stool softener, such as Colace (over the counter) 100 mg twice a day.  Use MiraLax (over the counter) for constipation as needed.     Diet - low sodium heart healthy    Complete by:  As directed      Discharge instructions    Complete by:  As directed   Ok to shower 5 days postop.  Do not apply any creams or ointments to incision.  Do not remove steri-strips.  Can use 4x4 gauze and tape for dressing changes.  No aggressive activity.  No bending, squatting or prolonged sitting.  Mostly be in reclined position or lying down.     Driving restrictions    Complete by:  As directed   No driving until further notice     Increase activity slowly as tolerated    Complete by:  As directed      Lifting restrictions    Complete by:  As directed   No lifting until further notice.           Follow-up Information    Follow up with [facility].   Specialty:  [place]   Why:  someone from [facility] will contact you concerning start date and time for therapy   Contact information:   [place] [address] [phone]       Schedule an appointment as soon as  possible for a visit with [name], MD.   Specialty:  Orthopedic Surgery   Why:  need return office visit next week.    Contact information:   [place] [address] [phone]       Discharge Plan:  discharge to home  Disposition:     Signed: [name]  02/16/2015, 9:55 AM

## 2015-02-16 NOTE — Discharge Summary (Signed)
Patient ID: [name] MRN: [id number] DOB/AGE: May 17, 1945 70 y.o.  Admit date: 02/05/2015 Discharge date: 02/16/2015  Admission Diagnoses:  Active Problems:   Post op infection   Infection of lumbar spine [facility])   Discharge Diagnoses:  Active Problems:   Post op infection   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[place])  status post Procedure(s): I AND D LUMBAR WITH WOUND VAC PLACEMENT  Past Medical History  Diagnosis Date  . Hypertension   . H/O cardiovascular stress test 02/2014    seen by Dr. Einar Gip - told that she was cleared for surgery  . Heart murmur   . Sleep apnea     CPAP in use- QO night, study done at [facility]- 2007   . Arthritis     Rheumatoid, followed by Dr. Estanislado Pandy, knees & ankles   . Anemia   . Shortness of breath dyspnea     with exertion    Surgeries: Procedure(s): I AND D LUMBAR WITH WOUND VAC PLACEMENT on 02/05/2015 - 02/06/2015   Consultants:    Discharged Condition: Improved  Hospital Course: [name] is an 70 y.o. female who was admitted 02/05/2015 for operative treatment of postop wound infection. Patient failed conservative treatments (please see the history and physical for the specifics) and had severe unremitting pain that affects sleep, daily activities and work/hobbies. After pre-op clearance, the patient was taken to the operating room on 02/05/2015 - 02/06/2015 and underwent  Procedure(s): I AND D LUMBAR WITH WOUND VAC PLACEMENT.    Patient was given perioperative antibiotics:  Anti-infectives    Start     Dose/Rate Route Frequency Ordered Stop   02/11/15 0000  amoxicillin-clavulanate (AUGMENTIN) 875-125 MG tablet    Comments:  For postop infection.   1 tablet Oral 2 times daily 02/11/15 1249     02/08/15 1600  Ampicillin-Sulbactam (UNASYN) 3 g in sodium chloride 0.9 % 100 mL IVPB  Status:  Discontinued     3 g 100 mL/hr over 60 Minutes Intravenous Every 6 hours 02/08/15 1516 02/11/15 2108   02/06/15 2100  vancomycin (VANCOCIN) 1,500 mg  in sodium chloride 0.9 % 500 mL IVPB  Status:  Discontinued     1,500 mg 250 mL/hr over 120 Minutes Intravenous Every 24 hours 02/05/15 2043 02/08/15 1454   02/06/15 1023  vancomycin (VANCOCIN) powder  Status:  Discontinued       As needed 02/06/15 1023 02/06/15 1046   02/05/15 2000  vancomycin (VANCOCIN) 1,500 mg in sodium chloride 0.9 % 500 mL IVPB     1,500 mg 250 mL/hr over 120 Minutes Intravenous  Once 02/05/15 1854 02/05/15 2347   02/05/15 2000  piperacillin-tazobactam (ZOSYN) IVPB 3.375 g  Status:  Discontinued     3.375 g 12.5 mL/hr over 240 Minutes Intravenous 3 times per day 02/05/15 1854 02/08/15 1454       Patient was given sequential compression devices and early ambulation to prevent DVT.   Patient benefited maximally from hospital stay and there were no complications. At the time of discharge, the patient was urinating/moving their bowels without difficulty, tolerating a regular diet, pain is controlled with oral pain medications and they have been cleared by PT/OT.   Recent vital signs: No data found.    Recent laboratory studies: No results for input(s): WBC, HGB, HCT, PLT, NA, K, CL, CO2, BUN, CREATININE, GLUCOSE, INR, CALCIUM in the last 72 hours.  Invalid input(s): PT, 2   Discharge Medications:     Medication List  STOP taking these medications        doxycycline 100 MG tablet  Commonly known as:  VIBRA-TABS     multivitamin with minerals Tabs tablet     traMADol 50 MG tablet  Commonly known as:  ULTRAM      TAKE these medications        amoxicillin-clavulanate 875-125 MG tablet  Commonly known as:  AUGMENTIN  Take 1 tablet by mouth 2 (two) times daily.     ferrous sulfate 325 (65 FE) MG EC tablet  Take 325 mg by mouth daily with breakfast.     folic acid 1 MG tablet  Commonly known as:  FOLVITE  Take 2 mg by mouth daily.     HUMIRA PEN 40 MG/0.8ML Pnkt  Generic drug:  Adalimumab  Inject 40 mg as directed every 14 (fourteen) days.  Reported on 02/05/2015     hydrochlorothiazide 25 MG tablet  Commonly known as:  HYDRODIURIL  Take 25 mg by mouth daily.     HYDROcodone-acetaminophen 10-325 MG tablet  Commonly known as:  NORCO  Take 1 tablet by mouth every 6 (six) hours as needed.     methocarbamol 500 MG tablet  Commonly known as:  ROBAXIN  Take 1 tablet (500 mg total) by mouth every 6 (six) hours as needed for muscle spasms.     polyethylene glycol packet  Commonly known as:  MIRALAX / GLYCOLAX  Take 17 g by mouth daily.     ramipril 2.5 MG capsule  Commonly known as:  ALTACE  Take 2.5 mg by mouth daily.        Diagnostic Studies: Dg Lumbar Spine 2-3 Views  01/17/2015  CLINICAL DATA:  Surgical localization imaging for lumbar spine decompression. EXAM: LUMBAR SPINE - 2-3 VIEW COMPARISON:  Lumbar MRI, 11/01/2014 FINDINGS: Two surgical needles have been inserted posteriorly. The more superior has its tip projecting 3.8 cm posterior to the lower posterior margin of the L2 vertebrae. The more inferior needle has its tip projecting 3.8 cm posterior to the posterior margin of the L3-L4 disc. IMPRESSION: Surgical localization imaging as described. Electronically Signed   By: Lajean Manes M.D.   On: 01/17/2015 15:25   Dg Abd 1 View  01/19/2015  CLINICAL DATA:  Pain and pressure in lower abdomen. EXAM: ABDOMEN - 1 VIEW COMPARISON:  None. FINDINGS: Portable supine view the abdomen shows diffuse gaseous distention of large and small bowel. Stool is seen scattered along the length of the colon. The visualized bony anatomy is unremarkable. IMPRESSION: Diffuse gaseous bowel distention, compatible with ileus. Electronically Signed   By: Misty Stanley M.D.   On: 01/19/2015 11:54        Discharge Instructions    Call MD / Call 911    Complete by:  As directed   If you experience chest pain or shortness of breath, CALL 911 and be transported to the hospital emergency room.  If you develope a fever above 101 F, pus (white  drainage) or increased drainage or redness at the wound, or calf pain, call your surgeon's office.     Constipation Prevention    Complete by:  As directed   Drink plenty of fluids.  Prune juice may be helpful.  You may use a stool softener, such as Colace (over the counter) 100 mg twice a day.  Use MiraLax (over the counter) for constipation as needed.     Diet - low sodium heart healthy    Complete by:  As  directed      Discharge instructions    Complete by:  As directed   Home health agency RN to do wound vac changes Mon, Wed, Fri.     Driving restrictions    Complete by:  As directed   No driving     Increase activity slowly as tolerated    Complete by:  As directed      Lifting restrictions    Complete by:  As directed   No lifting           Follow-up Information    Follow up with [facility].   Why:  They will contact you to schedule nurse visits.   Contact information:   [address] [place] [address] [phone]       Schedule an appointment as soon as possible for a visit with [name], MD.   Specialty:  Orthopedic Surgery   Why:  2 weeks   Contact information:   [place] [address] [phone]       Discharge Plan:  discharge to home  Disposition:     Signed: [name] 02/16/2015, 12:16 PM

## 2015-03-08 ENCOUNTER — Ambulatory Visit (INDEPENDENT_AMBULATORY_CARE_PROVIDER_SITE_OTHER): Payer: Medicare Other | Admitting: Internal Medicine

## 2015-03-08 VITALS — BP 128/72 | HR 92 | Temp 98.2°F | Ht 60.0 in | Wt 218.0 lb

## 2015-03-08 DIAGNOSIS — T814XXS Infection following a procedure, sequela: Secondary | ICD-10-CM | POA: Diagnosis not present

## 2015-03-08 DIAGNOSIS — IMO0001 Reserved for inherently not codable concepts without codable children: Secondary | ICD-10-CM

## 2015-03-08 LAB — BASIC METABOLIC PANEL
BUN: 20 mg/dL (ref 7–25)
CO2: 22 mmol/L (ref 20–31)
Calcium: 9.4 mg/dL (ref 8.6–10.4)
Chloride: 105 mmol/L (ref 98–110)
Creat: 1 mg/dL — ABNORMAL HIGH (ref 0.50–0.99)
Glucose, Bld: 106 mg/dL — ABNORMAL HIGH (ref 65–99)
Potassium: 3.7 mmol/L (ref 3.5–5.3)
Sodium: 141 mmol/L (ref 135–146)

## 2015-03-08 LAB — CBC WITH DIFFERENTIAL/PLATELET
Basophils Absolute: 0.1 10*3/uL (ref 0.0–0.1)
Basophils Relative: 1 % (ref 0–1)
Eosinophils Absolute: 0.2 10*3/uL (ref 0.0–0.7)
Eosinophils Relative: 2 % (ref 0–5)
HCT: 34.8 % — ABNORMAL LOW (ref 36.0–46.0)
Hemoglobin: 11 g/dL — ABNORMAL LOW (ref 12.0–15.0)
Lymphocytes Relative: 21 % (ref 12–46)
Lymphs Abs: 1.9 10*3/uL (ref 0.7–4.0)
MCH: 28.4 pg (ref 26.0–34.0)
MCHC: 31.6 g/dL (ref 30.0–36.0)
MCV: 89.7 fL (ref 78.0–100.0)
MPV: 10.2 fL (ref 8.6–12.4)
Monocytes Absolute: 0.4 10*3/uL (ref 0.1–1.0)
Monocytes Relative: 4 % (ref 3–12)
Neutro Abs: 6.6 10*3/uL (ref 1.7–7.7)
Neutrophils Relative %: 72 % (ref 43–77)
Platelets: 339 10*3/uL (ref 150–400)
RBC: 3.88 MIL/uL (ref 3.87–5.11)
RDW: 13.5 % (ref 11.5–15.5)
WBC: 9.1 10*3/uL (ref 4.0–10.5)

## 2015-03-08 LAB — C-REACTIVE PROTEIN: CRP: 5.8 mg/dL — ABNORMAL HIGH (ref <0.60)

## 2015-03-08 NOTE — Progress Notes (Signed)
Rfv: hospital follow up with superficial post operative wound infection Subjective:    Patient ID: [name], female    DOB: [birth year], 70 y.o.   MRN: [id number]  HPI  This is a 70 year old female with history of rheumatoid arthritis on humira who had previous lumbar 2 level decompression on January 17, 2015. Postoperatively, she had some problems with serous drainage from her surgical incision as well as skin irritation from tape. She previously had been on Humira,but had stopped this 2 weeks prior to the surgery. the drainage from the wound progressed, at one point had noticeable foul odor with associated skin necrosis for 2-3 cm and the wound dehisced open 5-6 mm with visual Vicryl sutures present and edge skin necrosis. She underwent I x D of superficial wound infection on jan 29th by Dr. Lorin Mercy. OR cultures were polymicrobial. She was initially on amp/sub and subsequently discharged on 4 weeks of amox/clav, which she is still taking, roughly has 7 days left. She has been on wound vac in addn to abtx for the past 4 wk. The wound is steadily getting better. Photos shown to dr. Lorin Mercy last week. She sees him tomorrow.  She denies any purulent drainage, no fever ,chills, pain to back. occ dislocation of wound vac  No Known Allergies Current Outpatient Prescriptions on File Prior to Visit  Medication Sig Dispense Refill  . amoxicillin-clavulanate (AUGMENTIN) 875-125 MG tablet Take 1 tablet by mouth 2 (two) times daily. 60 tablet 0  . ferrous sulfate 325 (65 FE) MG EC tablet Take 325 mg by mouth daily with breakfast.    . folic acid (FOLVITE) 1 MG tablet Take 2 mg by mouth daily.    Marland Kitchen HUMIRA PEN 40 MG/0.8ML PNKT Inject 40 mg as directed every 14 (fourteen) days. Reported on 02/05/2015    . hydrochlorothiazide (HYDRODIURIL) 25 MG tablet Take 25 mg by mouth daily.    Marland Kitchen HYDROcodone-acetaminophen (NORCO) 10-325 MG tablet Take 1 tablet by mouth every 6 (six) hours as needed. 60 tablet 0  .  methocarbamol (ROBAXIN) 500 MG tablet Take 1 tablet (500 mg total) by mouth every 6 (six) hours as needed for muscle spasms. 60 tablet 0  . polyethylene glycol (MIRALAX / GLYCOLAX) packet Take 17 g by mouth daily. 30 each 1  . ramipril (ALTACE) 2.5 MG capsule Take 2.5 mg by mouth daily.     No current facility-administered medications on file prior to visit.   Active Ambulatory Problems    Diagnosis Date Noted  . HYPERLIPIDEMIA 08/14/2006  . MORBID OBESITY 12/13/2005  . ANEMIA, NORMOCYTIC 04/13/2008  . CARPAL TUNNEL SYNDROME 12/13/2005  . PERIPHERAL NEUROPATHY 12/13/2005  . HYPERTENSION 12/13/2005  . DYSRHYTHMIA, CARDIAC NOS 12/13/2005  . CONSTIPATION 05/25/2008  . DERMATITIS 09/28/2008  . ANKLE PAIN, BILATERAL 11/25/2007  . [place] DISEASE 08/15/2006  . DYSPNEA ON EXERTION 05/25/2008  . Primary osteoarthritis of left knee 03/25/2014  . Rheumatoid arthritis of knee ([place]) 03/25/2014  . 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 History of lumbar laminectomy for spinal cord decompression 01/17/2015  . Lumbar spinal stenosis 01/17/2015  . Post op infection 02/05/2015  . 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[place]) 02/06/2015   Resolved Ambulatory Problems    Diagnosis Date Noted  . No Resolved Ambulatory Problems   Past Medical History  Diagnosis Date  . Hypertension   . H/O cardiovascular stress test 02/2014  . Heart murmur   . Sleep apnea   . Arthritis   . Anemia   .  Shortness of breath dyspnea    Social History  Substance Use Topics  . Smoking status: Former Smoker    Quit date: 03/16/1980  . Smokeless tobacco: Never Used  . Alcohol Use: No  family history is not on file.  Review of Systems + occ low back pain. Otherwise no drainage. No fever, chills, nightsweats    Objective:   Physical Exam BP 128/72 mmHg  Pulse 92  Temp(Src) 98.2 F (36.8 C)  Ht 5' (1.524 m)  Wt 218 lb (98.884 kg)  BMI 42.58 kg/m2 Physical Exam  Constitutional:  oriented to  person, place, and time. appears well-developed and well-nourished. No distress.  HENT: [place]/AT, PERRLA, no scleral icterus Mouth/Throat: Oropharynx is clear and moist. No oropharyngeal exudate.  Skin: Skin is warm and dry. No rash noted. No erythema or warmth of area visible through wound vac.  Psychiatric: a normal mood and affect.  behavior is normal.   Lab Results  Component Value Date   ESRSEDRATE 47* 03/08/2015   Lab Results  Component Value Date   CRP 5.8* 03/08/2015          Assessment & Plan:  Postsurgical wound infection, polymicrobial. Currently on 4th week of oral abtx with amox/clav. Will check sed rate, crp, cbc and bmp to ensure she is tolerating abtx. If inflammatory markers are still elevated, will continue with oral abtx x 2 more weeks at minimum. Would optimally like to see her inflammatory markers trending downward and normalizing  Hx of candidal viaginitis = continue to use fluconazole 150mg  prn  Addendum -> will treat for addn 3 wk of amox/clav and follow up in 3 wk

## 2015-03-09 ENCOUNTER — Inpatient Hospital Stay: Payer: Medicare Other | Admitting: Internal Medicine

## 2015-03-09 DIAGNOSIS — I639 Cerebral infarction, unspecified: Secondary | ICD-10-CM

## 2015-03-09 HISTORY — DX: Cerebral infarction, unspecified: I63.9

## 2015-03-09 LAB — SEDIMENTATION RATE: Sed Rate: 47 mm/hr — ABNORMAL HIGH (ref 0–30)

## 2015-03-11 ENCOUNTER — Encounter: Payer: Self-pay | Admitting: Internal Medicine

## 2015-03-11 MED ORDER — AMOXICILLIN-POT CLAVULANATE 875-125 MG PO TABS: 1.0000 | ORAL_TABLET | Freq: Two times a day (BID) | ORAL | Status: DC

## 2015-03-19 ENCOUNTER — Emergency Department (HOSPITAL_COMMUNITY): Payer: Medicare Other

## 2015-03-19 ENCOUNTER — Encounter (HOSPITAL_COMMUNITY): Payer: Self-pay | Admitting: Emergency Medicine

## 2015-03-19 ENCOUNTER — Observation Stay (HOSPITAL_COMMUNITY)
Admission: EM | Admit: 2015-03-19 | Discharge: 2015-03-21 | Disposition: A | Discharge status: Home / Self Care | Payer: Medicare Other | Attending: Internal Medicine | Admitting: Internal Medicine

## 2015-03-19 ENCOUNTER — Observation Stay (HOSPITAL_COMMUNITY): Payer: Medicare Other

## 2015-03-19 DIAGNOSIS — D649 Anemia, unspecified: Secondary | ICD-10-CM | POA: Diagnosis present

## 2015-03-19 DIAGNOSIS — Z6841 Body Mass Index (BMI) 40.0 and over, adult: Secondary | ICD-10-CM | POA: Diagnosis not present

## 2015-03-19 DIAGNOSIS — E785 Hyperlipidemia, unspecified: Secondary | ICD-10-CM | POA: Diagnosis present

## 2015-03-19 DIAGNOSIS — M4806 Spinal stenosis, lumbar region: Secondary | ICD-10-CM

## 2015-03-19 DIAGNOSIS — M19011 Primary osteoarthritis, right shoulder: Secondary | ICD-10-CM | POA: Insufficient documentation

## 2015-03-19 DIAGNOSIS — Z87891 Personal history of nicotine dependence: Secondary | ICD-10-CM | POA: Insufficient documentation

## 2015-03-19 DIAGNOSIS — I10 Essential (primary) hypertension: Secondary | ICD-10-CM | POA: Diagnosis present

## 2015-03-19 DIAGNOSIS — M48061 Spinal stenosis, lumbar region without neurogenic claudication: Secondary | ICD-10-CM | POA: Diagnosis present

## 2015-03-19 DIAGNOSIS — G459 Transient cerebral ischemic attack, unspecified: Principal | ICD-10-CM | POA: Diagnosis present

## 2015-03-19 DIAGNOSIS — R200 Anesthesia of skin: Secondary | ICD-10-CM

## 2015-03-19 DIAGNOSIS — B999 Unspecified infectious disease: Secondary | ICD-10-CM | POA: Diagnosis not present

## 2015-03-19 DIAGNOSIS — R29810 Facial weakness: Secondary | ICD-10-CM

## 2015-03-19 DIAGNOSIS — Y838 Other surgical procedures as the cause of abnormal reaction of the patient, or of later complication, without mention of misadventure at the time of the procedure: Secondary | ICD-10-CM | POA: Insufficient documentation

## 2015-03-19 DIAGNOSIS — M50321 Other cervical disc degeneration at C4-C5 level: Secondary | ICD-10-CM | POA: Diagnosis not present

## 2015-03-19 DIAGNOSIS — M869 Osteomyelitis, unspecified: Secondary | ICD-10-CM | POA: Diagnosis not present

## 2015-03-19 DIAGNOSIS — G4733 Obstructive sleep apnea (adult) (pediatric): Secondary | ICD-10-CM | POA: Insufficient documentation

## 2015-03-19 DIAGNOSIS — I639 Cerebral infarction, unspecified: Secondary | ICD-10-CM

## 2015-03-19 DIAGNOSIS — M4802 Spinal stenosis, cervical region: Secondary | ICD-10-CM | POA: Diagnosis not present

## 2015-03-19 DIAGNOSIS — T814XXA Infection following a procedure, initial encounter: Secondary | ICD-10-CM | POA: Insufficient documentation

## 2015-03-19 DIAGNOSIS — M069 Rheumatoid arthritis, unspecified: Secondary | ICD-10-CM | POA: Diagnosis not present

## 2015-03-19 DIAGNOSIS — Z79899 Other long term (current) drug therapy: Secondary | ICD-10-CM | POA: Insufficient documentation

## 2015-03-19 DIAGNOSIS — Z9889 Other specified postprocedural states: Secondary | ICD-10-CM

## 2015-03-19 DIAGNOSIS — M4626 Osteomyelitis of vertebra, lumbar region: Secondary | ICD-10-CM | POA: Diagnosis present

## 2015-03-19 HISTORY — DX: Heart failure, unspecified: I50.9

## 2015-03-19 LAB — I-STAT CHEM 8, ED
BUN: 21 mg/dL — ABNORMAL HIGH (ref 6–20)
Calcium, Ion: 1.28 mmol/L (ref 1.13–1.30)
Chloride: 101 mmol/L (ref 101–111)
Creatinine, Ser: 1 mg/dL (ref 0.44–1.00)
Glucose, Bld: 85 mg/dL (ref 65–99)
HCT: 38 % (ref 36.0–46.0)
Hemoglobin: 12.9 g/dL (ref 12.0–15.0)
Potassium: 3.9 mmol/L (ref 3.5–5.1)
Sodium: 140 mmol/L (ref 135–145)
TCO2: 27 mmol/L (ref 0–100)

## 2015-03-19 LAB — COMPREHENSIVE METABOLIC PANEL
ALT: 16 U/L (ref 14–54)
AST: 24 U/L (ref 15–41)
Albumin: 3.4 g/dL — ABNORMAL LOW (ref 3.5–5.0)
Alkaline Phosphatase: 97 U/L (ref 38–126)
Anion gap: 8 (ref 5–15)
BUN: 22 mg/dL — ABNORMAL HIGH (ref 6–20)
CO2: 29 mmol/L (ref 22–32)
Calcium: 9.6 mg/dL (ref 8.9–10.3)
Chloride: 103 mmol/L (ref 101–111)
Creatinine, Ser: 0.97 mg/dL (ref 0.44–1.00)
GFR calc Af Amer: >60 mL/min (ref >60)
GFR calc non Af Amer: 58 mL/min — ABNORMAL LOW (ref >60)
Glucose, Bld: 83 mg/dL (ref 65–99)
Potassium: 4.3 mmol/L (ref 3.5–5.1)
Sodium: 140 mmol/L (ref 135–145)
Total Bilirubin: 0.3 mg/dL (ref 0.3–1.2)
Total Protein: 7.3 g/dL (ref 6.5–8.1)

## 2015-03-19 LAB — CBC WITH DIFFERENTIAL/PLATELET
Basophils Absolute: 0.1 10*3/uL (ref 0.0–0.1)
Basophils Relative: 1 %
Eosinophils Absolute: 0.1 10*3/uL (ref 0.0–0.7)
Eosinophils Relative: 1 %
HCT: 35.7 % — ABNORMAL LOW (ref 36.0–46.0)
Hemoglobin: 11.3 g/dL — ABNORMAL LOW (ref 12.0–15.0)
Lymphocytes Relative: 24 %
Lymphs Abs: 2.4 10*3/uL (ref 0.7–4.0)
MCH: 28.8 pg (ref 26.0–34.0)
MCHC: 31.7 g/dL (ref 30.0–36.0)
MCV: 90.8 fL (ref 78.0–100.0)
Monocytes Absolute: 0.5 10*3/uL (ref 0.1–1.0)
Monocytes Relative: 5 %
Neutro Abs: 6.7 10*3/uL (ref 1.7–7.7)
Neutrophils Relative %: 69 %
Platelets: 330 10*3/uL (ref 150–400)
RBC: 3.93 MIL/uL (ref 3.87–5.11)
RDW: 13 % (ref 11.5–15.5)
WBC: 9.8 10*3/uL (ref 4.0–10.5)

## 2015-03-19 LAB — I-STAT TROPONIN, ED: Troponin i, poc: 0 ng/mL (ref 0.00–0.08)

## 2015-03-19 LAB — APTT: aPTT: 31 seconds (ref 24–37)

## 2015-03-19 LAB — CBG MONITORING, ED: Glucose-Capillary: 71 mg/dL (ref 65–99)

## 2015-03-19 LAB — PROTIME-INR
INR: 0.97 (ref 0.00–1.49)
Prothrombin Time: 13.1 seconds (ref 11.6–15.2)

## 2015-03-19 MED ORDER — ASPIRIN 300 MG RE SUPP
300.0000 mg | Freq: Every day | RECTAL | Status: DC
Start: 2015-03-20 — End: 2015-03-21

## 2015-03-19 MED ORDER — ADULT MULTIVITAMIN W/MINERALS CH
1.0000 | ORAL_TABLET | Freq: Every day | ORAL | Status: DC
  Administered 2015-03-19 – 2015-03-21 (×3): 1 via ORAL
  Filled 2015-03-19 (×4): qty 1

## 2015-03-19 MED ORDER — RAMIPRIL 2.5 MG PO CAPS
2.5000 mg | ORAL_CAPSULE | Freq: Every day | ORAL | Status: DC
  Administered 2015-03-19 – 2015-03-20 (×2): 2.5 mg via ORAL
  Filled 2015-03-19 (×4): qty 1

## 2015-03-19 MED ORDER — HYDROCODONE-ACETAMINOPHEN 10-325 MG PO TABS: 1.0000 | ORAL_TABLET | Freq: Four times a day (QID) | ORAL | Status: DC | PRN

## 2015-03-19 MED ORDER — ASPIRIN 325 MG PO TABS
325.0000 mg | ORAL_TABLET | Freq: Every day | ORAL | Status: DC
  Administered 2015-03-20 – 2015-03-21 (×2): 325 mg via ORAL
  Filled 2015-03-19 (×2): qty 1

## 2015-03-19 MED ORDER — SENNOSIDES-DOCUSATE SODIUM 8.6-50 MG PO TABS
1.0000 | ORAL_TABLET | Freq: Every evening | ORAL | Status: DC | PRN
Start: 2015-03-19 — End: 2015-03-21
  Filled 2015-03-19: qty 1

## 2015-03-19 MED ORDER — AMOXICILLIN-POT CLAVULANATE 875-125 MG PO TABS
1.0000 | ORAL_TABLET | Freq: Two times a day (BID) | ORAL | Status: DC
  Administered 2015-03-19 – 2015-03-21 (×4): 1 via ORAL
  Filled 2015-03-19 (×4): qty 1

## 2015-03-19 MED ORDER — ENOXAPARIN SODIUM 40 MG/0.4ML ~~LOC~~ SOLN
40.0000 mg | Freq: Every day | SUBCUTANEOUS | Status: DC
  Administered 2015-03-19 – 2015-03-20 (×2): 40 mg via SUBCUTANEOUS
  Filled 2015-03-19 (×2): qty 0.4

## 2015-03-19 MED ORDER — STROKE: EARLY STAGES OF RECOVERY BOOK
Freq: Once | Status: AC
  Administered 2015-03-20: 08:00:00
  Filled 2015-03-19: qty 1

## 2015-03-19 MED ORDER — IOHEXOL 350 MG/ML SOLN
80.0000 mL | Freq: Once | INTRAVENOUS | Status: AC | PRN
  Administered 2015-03-19: 100 mL via INTRAVENOUS

## 2015-03-19 MED ORDER — POLYETHYLENE GLYCOL 3350 17 G PO PACK
17.0000 g | PACK | Freq: Every day | ORAL | Status: DC
  Administered 2015-03-20 – 2015-03-21 (×2): 17 g via ORAL
  Filled 2015-03-19 (×4): qty 1

## 2015-03-19 MED ORDER — ASPIRIN 81 MG PO CHEW
324.0000 mg | CHEWABLE_TABLET | Freq: Once | ORAL | Status: AC
  Administered 2015-03-19: 324 mg via ORAL
  Filled 2015-03-19: qty 4

## 2015-03-19 MED ORDER — METHOCARBAMOL 500 MG PO TABS: 500.0000 mg | ORAL_TABLET | Freq: Four times a day (QID) | ORAL | Status: DC | PRN

## 2015-03-19 MED ORDER — FERROUS SULFATE 325 (65 FE) MG PO TABS
325.0000 mg | ORAL_TABLET | Freq: Every day | ORAL | Status: DC
  Administered 2015-03-20 – 2015-03-21 (×2): 325 mg via ORAL
  Filled 2015-03-19 (×2): qty 1

## 2015-03-19 MED ORDER — GADOBENATE DIMEGLUMINE 529 MG/ML IV SOLN
20.0000 mL | Freq: Once | INTRAVENOUS | Status: AC | PRN
  Administered 2015-03-19: 20 mL via INTRAVENOUS

## 2015-03-19 NOTE — ED Notes (Signed)
Pt to receive meds when she returns back to from [place]

## 2015-03-19 NOTE — ED Provider Notes (Signed)
CSN: [id number]     Arrival date & time 03/19/15  1038 History   First MD Initiated Contact with Patient 03/19/15 1042     Chief Complaint  Patient presents with  . Facial Droop     HPI 70 y.o. female presenting as a transfer from [facility] with concern for stroke. Patient reports that her son noticed some drooping to the entire right side of her face at around 9:30 this morning. This is accompanied by mild sensation of weakness in the right upper extremity. No sensory deficits. Patient has weakness to her bilateral lower extremities that she states is at baseline. No issues with dates no vision changes. She reports a mild right-sided frontal headache. Also complains of some tingling to the right lips. No other sensory disturbances. Patient does have a wound VAC in place over her lower back from recent spine surgery. Patient has no prior history of stroke or TIA.  Labs performed at the outside hospital including CBC, CMP, and i-STAT troponin were unremarkable. CT head with no evidence of acute ischemia. No infectious symptoms or recent illnesses. Glucose within normal limits.    Past Medical History  Diagnosis Date  . Hypertension   . H/O cardiovascular stress test 02/2014    seen by Dr. Einar Gip - told that she was cleared for surgery  . Heart murmur   . Sleep apnea     CPAP in use- QO night, study done at [facility]- 2007   . Arthritis     Rheumatoid, followed by Dr. Estanislado Pandy, knees & ankles   . Anemia   . Shortness of breath dyspnea     with exertion   Past Surgical History  Procedure Laterality Date  . Abdominal hysterectomy    . Hand surgery    . Knee surgery  1990's   . Foot surgery Bilateral     bunionectomy, calluses , also has several pins & screws  . Carpal tunnel release Right   . Tubal ligation    . Joint replacement      planned for 03/25/2014  . Total knee arthroplasty Left 03/25/2014    Procedure: TOTAL KNEE ARTHROPLASTY;  Surgeon: Garald Balding, MD;   Location: [place];  Service: Orthopedics;  Laterality: Left;  . Lumbar laminectomy/decompression microdiscectomy N/A 01/17/2015    Procedure: L2-3, L3-4 Decompression;  Surgeon: [name], MD;  Location: [place];  Service: Orthopedics;  Laterality: N/A;  . Lumbar laminectomy/decompression microdiscectomy N/A 02/06/2015    Procedure: I AND D LUMBAR WITH WOUND VAC PLACEMENT;  Surgeon: [name], MD;  Location: [place];  Service: Orthopedics;  Laterality: N/A;   History reviewed. No pertinent family history. Social History  Substance Use Topics  . Smoking status: Former Smoker    Quit date: 03/16/1980  . Smokeless tobacco: Never Used  . Alcohol Use: No   OB History    No data available     Review of Systems  Constitutional: Negative for fever, chills, activity change and appetite change.  HENT: Negative for congestion, rhinorrhea and sore throat.   Eyes: Negative for visual disturbance.  Respiratory: Negative for cough, shortness of breath and wheezing.   Cardiovascular: Negative for chest pain and palpitations.  Gastrointestinal: Negative for nausea, vomiting, abdominal pain, diarrhea, constipation, blood in stool and abdominal distention.  Genitourinary: Negative for dysuria, frequency and flank pain.  Musculoskeletal: Negative for myalgias, back pain, joint swelling, arthralgias, gait problem, neck pain and neck stiffness.  Skin: Negative for rash.  Neurological: Positive for weakness (R side of face, RUE) and headaches (mild, R frontal). Negative for dizziness, tremors, syncope, facial asymmetry, speech difficulty and numbness.  Psychiatric/Behavioral: Negative for behavioral problems, confusion and agitation.      Allergies  Review of patient's allergies indicates no known allergies.  Home Medications   Prior to Admission medications   Medication Sig Start Date End Date Taking? Authorizing Provider  amoxicillin-clavulanate (AUGMENTIN) 875-125 MG tablet Take 1 tablet by mouth 2  (two) times daily. 03/11/15  Yes Carlyle Basques, MD  ferrous sulfate 325 (65 FE) MG EC tablet Take 325 mg by mouth daily with breakfast.   Yes Historical Provider, MD  HUMIRA PEN 40 MG/0.8ML PNKT Inject 40 mg as directed every 14 (fourteen) days. Reported on 02/05/2015 12/09/14  Yes Historical Provider, MD  hydrochlorothiazide (HYDRODIURIL) 25 MG tablet Take 25 mg by mouth daily.   Yes Historical Provider, MD  HYDROcodone-acetaminophen (NORCO) 10-325 MG tablet Take 1 tablet by mouth every 6 (six) hours as needed. 02/11/15  Yes [name], PA-C  methocarbamol (ROBAXIN) 500 MG tablet Take 1 tablet (500 mg total) by mouth every 6 (six) hours as needed for muscle spasms. 02/11/15  Yes [name], PA-C  Multiple Vitamin (MULTIVITAMIN WITH MINERALS) TABS tablet Take 1 tablet by mouth daily.   Yes Historical Provider, MD  polyethylene glycol (MIRALAX / GLYCOLAX) packet Take 17 g by mouth daily. 01/21/15  Yes [name], PA-C  ramipril (ALTACE) 2.5 MG capsule Take 2.5 mg by mouth daily.   Yes Historical Provider, MD  traMADol (ULTRAM) 50 MG tablet Take 1 tablet by mouth every 6 (six) hours as needed for moderate pain.  12/25/14  Yes Historical Provider, MD   BP 120/69 mmHg  Pulse 71  Temp(Src) 98.6 F (37 C) (Oral)  Resp 16  Ht 5' (1.524 m)  Wt 98.431 kg  BMI 42.38 kg/m2  SpO2 100% Physical Exam  Constitutional: She is oriented to person, place, and time. She appears well-developed and well-nourished. No distress.  HENT:  Head: Normocephalic and atraumatic.  Right Ear: External ear normal.  Left Ear: External ear normal.  Nose: Nose normal.  Mouth/Throat: Oropharynx is clear and moist. No oropharyngeal exudate.  Eyes: Conjunctivae and EOM are normal. Pupils are equal, round, and reactive to light. Right eye exhibits no discharge. Left eye exhibits no discharge.  Neck: Normal range of motion. Neck supple.  Cardiovascular: Normal rate, regular rhythm and normal heart sounds.  Exam reveals no  gallop and no friction rub.   No murmur heard. Pulmonary/Chest: Effort normal and breath sounds normal. No respiratory distress. She has no wheezes. She has no rales.  Abdominal: Soft. Bowel sounds are normal. She exhibits no distension and no mass. There is no tenderness. There is no rebound and no guarding.  Musculoskeletal: Normal range of motion. She exhibits no edema or tenderness.  Neurological: She is alert and oriented to person, place, and time. She exhibits normal muscle tone.  Face symmetric, tongue midline. 5/5 strength in the RUE, 4/5 strength in the RLE (pt reports this is at baseline since recent spine surgery). 5/5 strength in the LUE and LLE. Intact sensation to light touch in all 4 extremities. Normal finger to nose, heel to shin, and rapid alternating movements. Normal speech.   Skin: Skin is warm and dry. No rash noted. She is not diaphoretic.  Psychiatric: She has a normal mood and affect. Her behavior is normal. Judgment and thought content normal.  ED Course  Procedures (including critical care time) Labs Review Labs Reviewed  CBC WITH DIFFERENTIAL/PLATELET - Abnormal; Notable for the following:    Hemoglobin 11.3 (*)    HCT 35.7 (*)    All other components within normal limits  COMPREHENSIVE METABOLIC PANEL - Abnormal; Notable for the following:    BUN 22 (*)    Albumin 3.4 (*)    GFR calc non Af Amer 58 (*)    All other components within normal limits  I-STAT CHEM 8, ED - Abnormal; Notable for the following:    BUN 21 (*)    All other components within normal limits  PROTIME-INR  APTT  CBG MONITORING, ED  I-STAT TROPOININ, ED    Imaging Review Ct Head Wo Contrast  03/19/2015  CLINICAL DATA:  Numbness and tingling in her lips. Right-sided facial drooping. Symptoms began upon waking today at 8:30 a.m. EXAM: CT HEAD WITHOUT CONTRAST TECHNIQUE: Contiguous axial images were obtained from the base of the skull through the vertex without intravenous contrast.  COMPARISON:  CT head without contrast 08/08/2011. FINDINGS: No acute cortical infarct, hemorrhage, or mass lesion is present. The ventricles are of normal size. No significant extra-axial fluid collection is evident. The paranasal sinuses and mastoid air cells are clear. The calvarium is intact. No significant extracranial soft tissue lesion is present. The globes and orbits are intact. IMPRESSION: 1. Negative CT of the head pre Electronically Signed   By: San Morelle M.D.   On: 03/19/2015 11:43   I have personally reviewed and evaluated these images and lab results as part of my medical decision-making.   EKG Interpretation   Date/Time:  Saturday March 19 2015 10:47:52 EST Ventricular Rate:  86 PR Interval:  124 QRS Duration: 91 QT Interval:  350 QTC Calculation: 419 R Axis:   56 Text Interpretation:  Sinus rhythm Confirmed by Lita Mains  MD, DAVID  ([id number]) on 03/19/2015 11:12:37 AM      MDM   Final diagnoses:  Lower facial weakness    4:27 PM Patient assessed on arrival to [facility]. Her right-sided facial droop has resolved but she still complains of tingling to the right side of her lips. She still feels as if her right upper extremity is weaker this is imperceptible to me on exam. Remainder of neuro exam as above. Patient denies additional symptoms at this time and MRI brain ordered for further evaluation.  MRI brain without indication of acute stroke. On reassessment all of the patient's symptoms have resolved. Given her risk factors for stroke, however, will admit to hospitalist service for TIA workup. She is stable for transfer to the floor.    Storm Sovine Algernon Huxley, MD 03/20/15 [id number]  Forde Dandy, MD 03/20/15 0100

## 2015-03-19 NOTE — ED Notes (Signed)
Pt's son sitting in chair watching tv waiting for pt who is in MRI.

## 2015-03-19 NOTE — ED Notes (Signed)
Attempted to call report x3. RN told to call back in 5 min

## 2015-03-19 NOTE — ED Notes (Addendum)
Attempted to call report. This RN was placed on hold and then told to call back in 10 minutes. This RN was also informed that the bed has not been approved yet and she would talk to the Charge RN when she returned.

## 2015-03-19 NOTE — ED Notes (Signed)
EDP in w/pt and son.

## 2015-03-19 NOTE — ED Notes (Signed)
Dr Oleta Mouse aware MRI has resulted.

## 2015-03-19 NOTE — H&P (Signed)
[facility] History and Physical  [name] [id number] DOB: 25-Sep-1945 DOA: 03/19/2015  Referring physician: ED physician PCP: Maggie Font, MD  Specialists:   Chief Complaint: Right facial droop, right lip numbness  HPI: [name] is a 70 y.o. female with PMH of hypertension, OSA not on CPAP, anemia, arthritis, rheumatoid arthritis, chronic back pain, recent lumbar decompression surgery and post surgery wound infection on VAC, who presents with right facial droop and R lip numbness.  Patient reports that she woke up with right facial droop and right lip numbness in the morning. Her R facial droop has gradually resolved, but she still has numbness and tingling sensation in the right lip and right face. Patient has bilateral leg numbness which she attributes to recent back surgery, no new weakness in her arms or legs. No vision change or hearing loss. She reports that she had lower back wound infection after lumbar surgery on 01/17/15. She has Sarah Ann RN coming to her home in helping her Vac and wound care. Her wound is improving and healing. Patient does not have chest pain, shortness breath, cough, abdominal pain, diarrhea, nausea, vomiting or symptoms of UTI.  In ED, patient was found to have WBC 9.8, temperature normal, lactate 0.97, no tachycardia, electrolytes and renal function okay, negative CT-head is negative for acute intracranial abnormalities, negative MRI of brain for acute intracranial abnormalities. Patient is amitted to inpatient for further evaluation and treatment and for observation. Neurology will be consulted by EDP.  EKG: Independently reviewed. QTC 419, no ischemic change.  Where does patient live?   At home   Can patient participate in ADLs?   Some   Review of Systems:   General: no fevers, chills, no changes in body weight, has fatigue HEENT: no blurry vision, hearing changes or sore throat Pulm: no dyspnea, coughing, wheezing CV: no chest pain,  no palpitations Abd: no nausea, vomiting, abdominal pain, diarrhea, constipation GU: no dysuria, burning on urination, increased urinary frequency, hematuria  Ext: no leg edema Neuro: has right facial droop and right lip numbness, has bilateral leg weakness, no vision change or hearing loss Skin: no rash MSK: No muscle spasm, no deformity, no limitation of range of movement in spin Heme: No easy bruising.  Travel history: No recent long distant travel.  Allergy: No Known Allergies  Past Medical History  Diagnosis Date  . Hypertension   . H/O cardiovascular stress test 02/2014    seen by Dr. Einar Gip - told that she was cleared for surgery  . Heart murmur   . Sleep apnea     CPAP in use- QO night, [facility] done at [facility]- 2007   . Arthritis     Rheumatoid, followed by Dr. Estanislado Pandy, knees & ankles   . Anemia   . Shortness of breath dyspnea     with exertion    Past Surgical History  Procedure Laterality Date  . Abdominal hysterectomy    . Hand surgery    . Knee surgery  1990's   . Foot surgery Bilateral     bunionectomy, calluses , also has several pins & screws  . Carpal tunnel release Right   . Tubal ligation    . Joint replacement      planned for 03/25/2014  . Total knee arthroplasty Left 03/25/2014    Procedure: TOTAL KNEE ARTHROPLASTY;  Surgeon: Garald Balding, MD;  Location: [place];  Service: Orthopedics;  Laterality: Left;  . Lumbar laminectomy/decompression microdiscectomy N/A 01/17/2015  Procedure: L2-3, L3-4 Decompression;  Surgeon: [name], MD;  Location: [place];  Service: Orthopedics;  Laterality: N/A;  . Lumbar laminectomy/decompression microdiscectomy N/A 02/06/2015    Procedure: I AND D LUMBAR WITH WOUND VAC PLACEMENT;  Surgeon: [name], MD;  Location: [place];  Service: Orthopedics;  Laterality: N/A;    Social History:  reports that she quit smoking about 35 years ago. She has never used smokeless tobacco. She reports that she does not drink alcohol or use  illicit drugs.  Family History:  Family History  Problem Relation Age of Onset  . Seizures Mother   . Transient ischemic attack Mother   . Heart disease Father     CHF  . Stroke Sister      Prior to Admission medications   Medication Sig Start Date End Date Taking? Authorizing Provider  amoxicillin-clavulanate (AUGMENTIN) 875-125 MG tablet Take 1 tablet by mouth 2 (two) times daily. 03/11/15  Yes Carlyle Basques, MD  ferrous sulfate 325 (65 FE) MG EC tablet Take 325 mg by mouth daily with breakfast.   Yes Historical Provider, MD  HUMIRA PEN 40 MG/0.8ML PNKT Inject 40 mg as directed every 14 (fourteen) days. Reported on 02/05/2015 12/09/14  Yes Historical Provider, MD  hydrochlorothiazide (HYDRODIURIL) 25 MG tablet Take 25 mg by mouth daily.   Yes Historical Provider, MD  HYDROcodone-acetaminophen (NORCO) 10-325 MG tablet Take 1 tablet by mouth every 6 (six) hours as needed. 02/11/15  Yes [name], PA-C  methocarbamol (ROBAXIN) 500 MG tablet Take 1 tablet (500 mg total) by mouth every 6 (six) hours as needed for muscle spasms. 02/11/15  Yes [name], PA-C  Multiple Vitamin (MULTIVITAMIN WITH MINERALS) TABS tablet Take 1 tablet by mouth daily.   Yes Historical Provider, MD  polyethylene glycol (MIRALAX / GLYCOLAX) packet Take 17 g by mouth daily. 01/21/15  Yes [name], PA-C  ramipril (ALTACE) 2.5 MG capsule Take 2.5 mg by mouth daily.   Yes Historical Provider, MD  traMADol (ULTRAM) 50 MG tablet Take 1 tablet by mouth every 6 (six) hours as needed for moderate pain.  12/25/14  Yes Historical Provider, MD    Physical Exam: Filed Vitals:   03/19/15 1600 03/19/15 1751 03/19/15 1800 03/19/15 1930  BP: 120/69 119/54 129/70 121/66  Pulse: 71 66 65 80  Temp: 98.6 F (37 C)     TempSrc: Oral     Resp: 16 16 11 13   Height:      Weight:      SpO2: 100% 100% 100% 100%   General: Not in acute distress HEENT:       Eyes: PERRL, EOMI, no scleral icterus.       ENT: No discharge from  the ears and nose, no pharynx injection, no tonsillar enlargement.        Neck: No JVD, no bruit, no mass felt. Heme: No neck lymph node enlargement. Cardiac: S1/S2, RRR, No murmurs, No gallops or rubs. Pulm: Good air movement bilaterally. No rales, wheezing, rhonchi or rubs. Abd: Soft, nondistended, nontender, no rebound pain, no organomegaly, BS present. Ext: No pitting leg edema bilaterally. 2+DP/PT pulse bilaterally. Musculoskeletal: No joint deformities, No joint redness or warmth, no limitation of ROM in spin. Skin: No rashes.  Neuro: Alert, oriented X3, cranial nerves II-XII grossly intact, moves all extremities normally. Muscle strength 4/5 in both legs, 5/5 in arms, sensation to light touch is decreased in right face. Negative Babinski's sign. Normal finger to nose test.  Psych: Patient is not psychotic, no suicidal or hemocidal ideation.  Labs on Admission:  Basic Metabolic Panel:  Recent Labs Lab 03/19/15 1052 03/19/15 1104  NA 140 140  K 3.9 4.3  CL 101 103  CO2  --  29  GLUCOSE 85 83  BUN 21* 22*  CREATININE 1.00 0.97  CALCIUM  --  9.6   Liver Function Tests:  Recent Labs Lab 03/19/15 1104  AST 24  ALT 16  ALKPHOS 97  BILITOT 0.3  PROT 7.3  ALBUMIN 3.4*   No results for input(s): LIPASE, AMYLASE in the last 168 hours. No results for input(s): AMMONIA in the last 168 hours. CBC:  Recent Labs Lab 03/19/15 1052 03/19/15 1104  WBC  --  9.8  NEUTROABS  --  6.7  HGB 12.9 11.3*  HCT 38.0 35.7*  MCV  --  90.8  PLT  --  330   Cardiac Enzymes: No results for input(s): CKTOTAL, CKMB, CKMBINDEX, TROPONINI in the last 168 hours.  BNP (last 3 results) No results for input(s): BNP in the last 8760 hours.  ProBNP (last 3 results) No results for input(s): PROBNP in the last 8760 hours.  CBG:  Recent Labs Lab 03/19/15 1045  GLUCAP 71    Radiological Exams on Admission: Ct Head Wo Contrast  03/19/2015  CLINICAL DATA:  Numbness and tingling in her  lips. Right-sided facial drooping. Symptoms began upon waking today at 8:30 a.m. EXAM: CT HEAD WITHOUT CONTRAST TECHNIQUE: Contiguous axial images were obtained from the base of the skull through the vertex without intravenous contrast. COMPARISON:  CT head without contrast 08/08/2011. FINDINGS: No acute cortical infarct, hemorrhage, or mass lesion is present. The ventricles are of normal size. No significant extra-axial fluid collection is evident. The paranasal sinuses and mastoid air cells are clear. The calvarium is intact. No significant extracranial soft tissue lesion is present. The globes and orbits are intact. IMPRESSION: 1. Negative CT of the head pre Electronically Signed   By: San Morelle M.D.   On: 03/19/2015 11:43   Mr Jeri Cos [id number] Contrast  03/19/2015  CLINICAL DATA:  Right-sided facial droop and right arm weakness. Right-sided lip tingling. EXAM: MRI HEAD WITHOUT AND WITH CONTRAST TECHNIQUE: Multiplanar, multiecho pulse sequences of the brain and surrounding structures were obtained without and with intravenous contrast. CONTRAST:  72mL MULTIHANCE GADOBENATE DIMEGLUMINE 529 MG/ML IV SOLN COMPARISON:  Head CT 03/19/2015 and MRI 07/19/2005 FINDINGS: There is no evidence of acute infarct, intracranial hemorrhage, mass, midline shift, or extra-axial fluid collection. Ventricles and sulci are within normal limits for age. Several punctate foci of T2 hyperintensity in the cerebral white matter are similar to the prior MRI, nonspecific, and not greater than expected for age. No abnormal enhancement is identified. Orbits are unremarkable. There is a small amount of fluid in the left sphenoid sinus. The mastoid air cells are clear. Major intracranial vascular flow voids are preserved, with the right vertebral artery appearing hypoplastic. Multilevel cervical spondylosis is partially visualized, and there is grade 1 anterolisthesis of C4 on C5. IMPRESSION: Unremarkable appearance of the brain for  age. Electronically Signed   By: Logan Bores M.D.   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 Neurology was consulted. Dr. Leonel Ramsay evaluated patient, recommended TIA workup.  -will admit to tele bed -Appreciate Dr. Leonel Ramsay consultation, the follow-up recommendations as follows   1. HgbA1c, fasting lipid panel  2. CTA head and neck  3. Frequent neuro checks  4. Echocardiogram  5. Carotid dopplers are not needed given CTA  6. Prophylactic therapy-Antiplatelet med: Aspirin - dose 325mg  PO or 300mg  PR  7. Risk factor modification  8. Telemetry monitoring -check Vb12 level  HLD: Last LDL was not on record -Check FLP  Hypertension: -Hold HCTZ -Continue ramipril  Wound infection in lumbar spin after lumbar laminectomy for spinal cord decompression: Has Vac in place, improving per pt. -consult to wound care -continue Augmentin -continue Norco and robaxion  DVT ppx: SQ Lovenox  Code Status: Full code Family Communication: Yes, patient's son at bed side Disposition Plan: Admit to inpatient   Date of Service 03/19/2015    Ivor Costa [facility] Pager [phone]  If 7PM-7AM, please contact night-coverage [email or web address] Password [email or web address] 03/19/2015, 8:25 PM

## 2015-03-19 NOTE — ED Notes (Signed)
Pt given crackers and Ginger Ale as requested - aware EDP will be in to give results of MRI soon.

## 2015-03-19 NOTE — ED Notes (Signed)
Neurologist at bedside. 

## 2015-03-19 NOTE — ED Notes (Addendum)
Pt arrived from [facility] via [facility]. Pt alert, oriented. Denies any c/o weakness except to bil legs which she states is not new. Denies numbness. C/o mild tingling to right lips. Pt noted w/wound vac to lower back - states had lower back surgery Jan 2017 and has wound vac d/t infection. States HH RN comes to her home 3 times/week.

## 2015-03-19 NOTE — ED Provider Notes (Signed)
CSN: [id number]     Arrival date & time 03/19/15  1038 History  By signing my name below, I, Stephania Fragmin, attest that this documentation has been prepared under the direction and in the presence of Julianne Rice, MD. Electronically Signed: Stephania Fragmin, ED Scribe. 03/19/2015. 11:02 AM.    Chief Complaint  Patient presents with  . Facial Droop   The history is provided by the patient. No language interpreter was used.   HPI Comments: [name] is a 70 y.o. female with a history of hypertension, who presents to the Emergency Department complaining of right-sided facial droop that her son first noticed this morning about 1 hour ago, at 9:30 AM. She states she was in her usual state of health when she went to bed and was LKW with no witnessed droop at 10 PM. Patient states she is asymptomatic otherwise and would not have noticed the facial droop if her son had not pointed it out. She notes a history of chronic RLE weakness and numbness but states it is unchanged since her back surgery.  She notes she has experienced facial numbness in the past but has not ever had a facial droop. She states she has not eaten anything since waking up at 8:30 AM this morning. She denies any numbness, ear pain, or tinnitus. No visual changes or speech changes.  Past Medical History  Diagnosis Date  . Hypertension   . H/O cardiovascular stress test 02/2014    seen by Dr. Einar Gip - told that she was cleared for surgery  . Heart murmur   . Sleep apnea     CPAP in use- QO night, study done at [facility]- 2007   . Arthritis     Rheumatoid, followed by Dr. Estanislado Pandy, knees & ankles   . Anemia   . Shortness of breath dyspnea     with exertion   Past Surgical History  Procedure Laterality Date  . Abdominal hysterectomy    . Hand surgery    . Knee surgery  1990's   . Foot surgery Bilateral     bunionectomy, calluses , also has several pins & screws  . Carpal tunnel release Right   . Tubal ligation    . Joint  replacement      planned for 03/25/2014  . Total knee arthroplasty Left 03/25/2014    Procedure: TOTAL KNEE ARTHROPLASTY;  Surgeon: Garald Balding, MD;  Location: [facility];  Service: Orthopedics;  Laterality: Left;  . Lumbar laminectomy/decompression microdiscectomy N/A 01/17/2015    Procedure: L2-3, L3-4 Decompression;  Surgeon: [name], MD;  Location: [facility];  Service: Orthopedics;  Laterality: N/A;  . Lumbar laminectomy/decompression microdiscectomy N/A 02/06/2015    Procedure: I AND D LUMBAR WITH WOUND VAC PLACEMENT;  Surgeon: [name], MD;  Location: [facility];  Service: Orthopedics;  Laterality: N/A;   History reviewed. No pertinent family history. Social History  Substance Use Topics  . Smoking status: Former Smoker    Quit date: 03/16/1980  . Smokeless tobacco: Never Used  . Alcohol Use: No   OB History    No data available     Review of Systems  Constitutional: Negative for fever and chills.  HENT: Negative for ear pain and tinnitus.   Eyes: Negative for visual disturbance.  Respiratory: Negative for shortness of breath.   Cardiovascular: Negative for chest pain.  Gastrointestinal: Negative for nausea, vomiting, abdominal pain and diarrhea.  Musculoskeletal: Negative for myalgias, back pain, neck pain and neck  stiffness.  Skin: Negative for rash and wound.  Neurological: Positive for facial asymmetry, weakness and numbness. Negative for dizziness and headaches.  All other systems reviewed and are negative.    Allergies  Review of patient's allergies indicates no known allergies.  Home Medications   Prior to Admission medications   Medication Sig Start Date End Date Taking? Authorizing Provider  amoxicillin-clavulanate (AUGMENTIN) 875-125 MG tablet Take 1 tablet by mouth 2 (two) times daily. 03/11/15  Yes Carlyle Basques, MD  ferrous sulfate 325 (65 FE) MG EC tablet Take 325 mg by mouth daily with breakfast.   Yes Historical Provider, MD  HUMIRA PEN 40 MG/0.8ML PNKT  Inject 40 mg as directed every 14 (fourteen) days. Reported on 02/05/2015 12/09/14  Yes Historical Provider, MD  hydrochlorothiazide (HYDRODIURIL) 25 MG tablet Take 25 mg by mouth daily.   Yes Historical Provider, MD  HYDROcodone-acetaminophen (NORCO) 10-325 MG tablet Take 1 tablet by mouth every 6 (six) hours as needed. 02/11/15  Yes [name], PA-C  methocarbamol (ROBAXIN) 500 MG tablet Take 1 tablet (500 mg total) by mouth every 6 (six) hours as needed for muscle spasms. 02/11/15  Yes [name], PA-C  Multiple Vitamin (MULTIVITAMIN WITH MINERALS) TABS tablet Take 1 tablet by mouth daily.   Yes Historical Provider, MD  polyethylene glycol (MIRALAX / GLYCOLAX) packet Take 17 g by mouth daily. 01/21/15  Yes [name], PA-C  ramipril (ALTACE) 2.5 MG capsule Take 2.5 mg by mouth daily.   Yes Historical Provider, MD  traMADol (ULTRAM) 50 MG tablet Take 1 tablet by mouth every 6 (six) hours as needed for moderate pain.  12/25/14  Yes Historical Provider, MD   BP 125/67 mmHg  Pulse 66  Temp(Src) 97.9 F (36.6 C) (Oral)  Resp 10  Ht 5' (1.524 m)  Wt 217 lb (98.431 kg)  BMI 42.38 kg/m2  SpO2 99% Physical Exam  Constitutional: She is oriented to person, place, and time. She appears well-developed and well-nourished. No distress.  HENT:  Head: Normocephalic and atraumatic.  Right Ear: External ear normal.  Left Ear: External ear normal.  Mouth/Throat: Oropharynx is clear and moist.  Eyes: Conjunctivae and EOM are normal. Pupils are equal, round, and reactive to light.  Neck: Normal range of motion. Neck supple. No tracheal deviation present.  No meningismus  Cardiovascular: Normal rate and regular rhythm.   Pulmonary/Chest: Effort normal and breath sounds normal. No respiratory distress. She has no wheezes. She has no rales.  Abdominal: Soft. Bowel sounds are normal.  Musculoskeletal: Normal range of motion. She exhibits no edema or tenderness.  Neurological: She is alert and oriented to  person, place, and time.  Patient is alert and oriented x3 with clear, goal oriented speech. Patient has 5/5 motor in bilateral upper extremities and left lower extremity. 4/5 motor and right lower extremity. Sensation is intact to light touch in all extremities except slightly decreased in the right lower. Per patient both weakness and numbness in the right lower extremity is chronic. Bilateral finger-to-nose is normal with no signs of dysmetria. Patient has a normal gait and walks without assistance. Patient has slight right lower facial droop. Small amount of right eye ptosis. Right forehead appears to be slightly weaker than the left when patient asked to raise eyebrows.  Skin: Skin is warm and dry. No rash noted. No erythema.  Psychiatric: She has a normal mood and affect. Her behavior is normal.  Nursing note and vitals reviewed.   ED Course  Procedures (including critical care time)  DIAGNOSTIC STUDIES: Oxygen Saturation is 100% on RA, normal by my interpretation.    COORDINATION OF CARE: 10:41 AM - Suspect tIA. Discussed treatment plan with pt at bedside which includes diagnostic testing. Pt verbalized understanding and agreed to plan.   Labs Review Labs Reviewed  CBC WITH DIFFERENTIAL/PLATELET - Abnormal; Notable for the following:    Hemoglobin 11.3 (*)    HCT 35.7 (*)    All other components within normal limits  COMPREHENSIVE METABOLIC PANEL - Abnormal; Notable for the following:    BUN 22 (*)    Albumin 3.4 (*)    GFR calc non Af Amer 58 (*)    All other components within normal limits  I-STAT CHEM 8, ED - Abnormal; Notable for the following:    BUN 21 (*)    All other components within normal limits  PROTIME-INR  APTT  CBG MONITORING, ED  I-STAT TROPOININ, ED    Imaging Review Ct Head Wo Contrast  03/19/2015  CLINICAL DATA:  Numbness and tingling in her lips. Right-sided facial drooping. Symptoms began upon waking today at 8:30 a.m. EXAM: CT HEAD WITHOUT CONTRAST  TECHNIQUE: Contiguous axial images were obtained from the base of the skull through the vertex without intravenous contrast. COMPARISON:  CT head without contrast 08/08/2011. FINDINGS: No acute cortical infarct, hemorrhage, or mass lesion is present. The ventricles are of normal size. No significant extra-axial fluid collection is evident. The paranasal sinuses and mastoid air cells are clear. The calvarium is intact. No significant extracranial soft tissue lesion is present. The globes and orbits are intact. IMPRESSION: 1. Negative CT of the head pre Electronically Signed   By: San Morelle M.D.   On: 03/19/2015 11:43   I have personally reviewed and evaluated these images and lab results as part of my medical decision-making.   EKG Interpretation   Date/Time:  Saturday March 19 2015 10:47:52 EST Ventricular Rate:  86 PR Interval:  124 QRS Duration: 91 QT Interval:  350 QTC Calculation: 419 R Axis:   56 Text Interpretation:  Sinus rhythm Confirmed by Lita Mains  MD, Lilyann Gravelle  ([id number]) on 03/19/2015 11:12:37 AM      MDM   Final diagnoses:  Lower facial weakness    I personally performed the services described in this documentation, which was scribed in my presence. The recorded information has been reviewed and is accurate.   Likely Bell's palsy. The right forehead weakness is not profound. Patient does have chronic right lower extremity weakness. Last seen normal was 10 PM yesterday therefore the patient is not a candidate for any thrombolytics. Given age and the neurologic exam will workup to rule out stroke. CT without evidence of acute infarct. Discussed with Dr. Merlene Laughter. Recommends aspirin and transfer to [facility] for MRI. Dr. Rex Kras will accept patient in transfer.    Julianne Rice, MD 03/19/15 1325

## 2015-03-19 NOTE — ED Notes (Signed)
Son arrived to bedside

## 2015-03-19 NOTE — ED Notes (Addendum)
This RN was placed on hold for 10 min upon the 2nd attempt to call report.

## 2015-03-19 NOTE — ED Notes (Signed)
Hospitalist MD at bedside. 

## 2015-03-19 NOTE — Consult Note (Signed)
Neurology Consultation Reason for Consult: Transient right facial weakness and numbness Referring Physician: Mora Bellman  CC: Transient right facial weakness and numbness  History is obtained from: Patient, husband  HPI: [name] is a 70 y.o. female with a history of hypertension, rheumatoid arthritis who presents with transient right facial weakness and numbness. She states that she awoke this morning with these symptoms. She went to [facility] where facial weakness was seen and documented, and the suspicion of Bell's palsy was raised. Given that she had numbness as well, however, [facility] was referred [facility].    LKW: 3/10 10 PM tpa given?: no, resolved symptoms    ROS: A 14 point ROS was performed and is negative except as noted in the HPI.   Past Medical History  Diagnosis Date  . Hypertension   . H/O cardiovascular stress test 02/2014    seen by Dr. Einar Gip - told that she was cleared for surgery  . Heart murmur   . Sleep apnea     CPAP in use- QO night, study done at [facility]- 2007   . Arthritis     Rheumatoid, followed by Dr. Estanislado Pandy, knees & ankles   . Anemia   . Shortness of breath dyspnea     with exertion     Family History  Problem Relation Age of Onset  . Seizures Mother   . Transient ischemic attack Mother   . Heart disease Father     CHF  . Stroke Sister      Social History:  reports that she quit smoking about 35 years ago. She has never used smokeless tobacco. She reports that she does not drink alcohol or use illicit drugs.   Exam: Current vital signs: BP 121/66 mmHg  Pulse 80  Temp(Src) 98.6 F (37 C) (Oral)  Resp 13  Ht 5' (1.524 m)  Wt 98.431 kg (217 lb)  BMI 42.38 kg/m2  SpO2 100% Vital signs in last 24 hours: Temp:  [97.8 F (36.6 C)-98.6 F (37 C)] 98.6 F (37 C) (03/11 1600) Pulse Rate:  [65-82] 80 (03/11 1930) Resp:  [9-18] 13 (03/11 1930) BP: (103-149)/(53-95) 121/66 mmHg (03/11 1930) SpO2:  [95 %-100 %] 100 % (03/11  1930) Weight:  [98.431 kg (217 lb)] 98.431 kg (217 lb) (03/11 1048)   Physical Exam  Constitutional: Appears early Psych: Affect appropriate to situation Eyes: No scleral injection HENT: No OP obstrucion Head: Normocephalic.  Cardiovascular: Normal rate and regular rhythm.  Respiratory: Effort normal and breath sounds normal to anterior ascultation GI: Soft.  No distension. There is no tenderness.  Skin: WDI  Neuro: Mental Status: Patient is awake, alert, oriented to person, place, month, year, and situation. Patient is able to give a clear and coherent history. No signs of aphasia or neglect Cranial Nerves: II: Visual Fields are full. Pupils are equal, round, and reactive to light.   III,IV, VI: EOMI without ptosis or diploplia.  V: Facial sensation is symmetric to temperature VII: Facial movement is symmetric.  VIII: hearing is intact to voice X: Uvula elevates symmetrically XI: Shoulder shrug is symmetric. XII: tongue is midline without atrophy or fasciculations.  Motor: Tone is normal. Bulk is normal. 5/5 strength was present in bilateral upper extremities, right >left lower extremity weakness 4 minus/5 in the right leg, 4/5 in the left leg Sensory: Sensation is diminished in the right leg Cerebellar: FNF intact bilaterally   I have reviewed labs in epic and the results pertinent  to this consultation are: CMP-unremarkable  I have reviewed the images obtained: MRI brain-negative  Impression: 70 year old female with transient right facial weakness and numbness. Given that resolved, this is not Bell's palsy. Also numbness is not typical for Bell's palsy. I suspect this does represent TIA she is being admitted for workup of such. On no antiplatelet therapy prior to arrival.   Recommendations: 1. HgbA1c, fasting lipid panel 2. CTA head and neck 3. Frequent neuro checks 4. Echocardiogram 5. Carotid dopplers are not needed given CTA 6. Prophylactic therapy-Antiplatelet  med: Aspirin - dose 325mg  PO or 300mg  PR 7. Risk factor modification 8. Telemetry monitoring 9. please page stroke NP  Or  PA  Or MD  M-F from 8am -4 pm starting 3/12 as this patient will be followed by the stroke team at this point.   You can look them up on [email or web address]  Password [email or web address]    Roland Rack, MD [facility] [phone]  If 7pm- 7am, please page neurology on call as listed in [facility].

## 2015-03-19 NOTE — ED Notes (Signed)
Pt states she woke up this morning with numbness and tingling in her lips.  States her son noticed some facial drooping, but pt did not notice herself.  Woke up around 0830.  Pt denies weakness, tingling, numbness in extremities.

## 2015-03-20 ENCOUNTER — Observation Stay (HOSPITAL_BASED_OUTPATIENT_CLINIC_OR_DEPARTMENT_OTHER): Payer: Medicare Other

## 2015-03-20 DIAGNOSIS — G459 Transient cerebral ischemic attack, unspecified: Secondary | ICD-10-CM

## 2015-03-20 DIAGNOSIS — E785 Hyperlipidemia, unspecified: Secondary | ICD-10-CM | POA: Diagnosis not present

## 2015-03-20 DIAGNOSIS — M869 Osteomyelitis, unspecified: Secondary | ICD-10-CM | POA: Diagnosis not present

## 2015-03-20 DIAGNOSIS — G451 Carotid artery syndrome (hemispheric): Secondary | ICD-10-CM

## 2015-03-20 DIAGNOSIS — M4806 Spinal stenosis, lumbar region: Secondary | ICD-10-CM | POA: Diagnosis not present

## 2015-03-20 DIAGNOSIS — I10 Essential (primary) hypertension: Secondary | ICD-10-CM | POA: Diagnosis not present

## 2015-03-20 LAB — ECHOCARDIOGRAM COMPLETE
AORTIC ROOT 2D: 30 mm
E decel time: 278 msec
E/e' ratio: 9.63
FS: 31 % (ref 28–44)
Height: 60 in
IVS/LV PW RATIO, ED: 0.91
LA ID, A-P, ES: 26 mm
LA SIZE INDEX: 1.35 mm/m2
LA VOL 2D INDEX: 23.4 mL/m2
LA VOL 2D: 45.1 mL
LA diam end sys: 26 mm
LA vol index: 21.7 mL/m2
LA vol: 41.9 mL
LV PW d: 11.6 mm — AB (ref 0.6–1.1)
LV PW s: 11.6 mm
LVIDD: 41.4 mm — AB (ref 3.5–6.0)
LVIDS: 28.5 mm — AB (ref 2.1–4.0)
LVOT area: 3.14 cm2
LVOT diameter: 20 mm
MV Dec: 278 ms
MV Peak grad: 4 mmHg
MV pk A vel: 101 cm/s
MV pk E vel: 104 cm/s
TDI e' lateral: 10.8 cm/s
TDI e' medial: 8.81 cm/s
TR max vel: 224 cm/s
TV PEAK RV-RA GRADIENT: 20 cm/s
Weight: 3463.87 oz

## 2015-03-20 LAB — LIPID PANEL
Cholesterol: 166 mg/dL (ref 0–200)
HDL: 66 mg/dL (ref >40)
LDL Cholesterol: 89 mg/dL (ref 0–99)
Total CHOL/HDL Ratio: 2.5 RATIO
Triglycerides: 55 mg/dL (ref <150)
VLDL: 11 mg/dL (ref 0–40)

## 2015-03-20 LAB — RAPID URINE DRUG SCREEN, HOSP PERFORMED
Amphetamines: NOT DETECTED
Barbiturates: NOT DETECTED
Benzodiazepines: NOT DETECTED
Cocaine: NOT DETECTED
Opiates: NOT DETECTED
Tetrahydrocannabinol: NOT DETECTED

## 2015-03-20 LAB — VITAMIN B12: Vitamin B-12: 408 pg/mL (ref 180–914)

## 2015-03-20 MED ORDER — ATORVASTATIN CALCIUM 10 MG PO TABS
10.0000 mg | ORAL_TABLET | Freq: Every day | ORAL | Status: DC
  Administered 2015-03-20: 10 mg via ORAL
  Filled 2015-03-20: qty 1

## 2015-03-20 NOTE — Progress Notes (Signed)
  Echocardiogram 2D Echocardiogram has been performed.  Diamond Nickel 03/20/2015, 3:28 PM

## 2015-03-20 NOTE — Progress Notes (Signed)
[name] [id number] DOB: 12/07/1945 DOA: 03/19/2015 PCP: Maggie Font, MD  Brief narrative: 70 y/o ? H/o lumbar laminectomy 01/17/15 l2-3, l3-4 Resulting wound infection r TKA 2016 H/o Rheum Arthritis on humira priro hemorrhoidal bleed HLD Htn  Admitted with s/sym of TIA 03/19/15    Past medical history-As per Problem list Chart reviewed as below-   Consultants:  Neurology  Procedures:  CTA  MRI  Antibiotics:  None   Subjective  Tells me symptoms started yesterday morning when she awoke. Numbness to the right side of face watery eyes no slurred speech noted group no upper or lower extremity weakness    Objective    Interim History: None  Telemetry: Sinus F2   Objective: Filed Vitals:   03/19/15 2330 03/20/15 0152 03/20/15 0415 03/20/15 0606  BP: 104/55 107/72 103/79 115/67  Pulse: 72 74 71   Temp: 98.5 F (36.9 C) 98.5 F (36.9 C) 98.7 F (37.1 C) 98.6 F (37 C)  TempSrc: Oral Oral Oral Oral  Resp: 18  18 18   Height: 5' (1.524 m)     Weight: 98.2 kg (216 lb 7.9 oz)     SpO2: 99% 98% 100% 98%   No intake or output data in the 24 hours ending 03/20/15 0723  Exam:  General: Obese pleasant oriented African-American female Cardiovascular: S1-S2 no murmur rub or gallop Respiratory: Clinically clear no added sound Abdomen: Soft nontender nondistended no rebound Skin wound VAC in situ lower back Neuro no gross focal abnormality moving all 4 limbs equally, no slurred speech, temperature is midline, power 5/5, reflexes 2/3, no lower extremity edema   Data Reviewed: Basic Metabolic Panel:  Recent Labs Lab 03/19/15 1052 03/19/15 1104  NA 140 140  K 3.9 4.3  CL 101 103  CO2  --  29  GLUCOSE 85 83  BUN 21* 22*  CREATININE 1.00 0.97  CALCIUM  --  9.6   Liver Function Tests:  Recent Labs Lab 03/19/15 1104  AST 24  ALT 16  ALKPHOS 97  BILITOT 0.3  PROT 7.3  ALBUMIN 3.4*   No results for input(s): LIPASE, AMYLASE in the  last 168 hours. No results for input(s): AMMONIA in the last 168 hours. CBC:  Recent Labs Lab 03/19/15 1052 03/19/15 1104  WBC  --  9.8  NEUTROABS  --  6.7  HGB 12.9 11.3*  HCT 38.0 35.7*  MCV  --  90.8  PLT  --  330   Cardiac Enzymes: No results for input(s): CKTOTAL, CKMB, CKMBINDEX, TROPONINI in the last 168 hours. BNP: Invalid input(s): POCBNP CBG:  Recent Labs Lab 03/19/15 1045  GLUCAP 71    No results found for this or any previous visit (from the past 240 hour(s)).   Studies:              All Imaging reviewed and is as per above notation   Scheduled Meds: .  stroke: mapping our early stages of recovery book   Does not apply Once  . amoxicillin-clavulanate  1 tablet Oral BID  . aspirin  300 mg Rectal Daily   Or  . aspirin  325 mg Oral Daily  . enoxaparin (LOVENOX) injection  40 mg Subcutaneous QHS  . ferrous sulfate  325 mg Oral Q breakfast  . multivitamin with minerals  1 tablet Oral Daily  . polyethylene glycol  17 g Oral Daily  . ramipril  2.5 mg Oral Daily   Continuous Infusions:    Assessment/Plan:  1. ? TIA-further workup per neurology await echocardiogram-continue aspirin 325 daily, follow A1c lipid panel 2. Lumbar laminectomy 01/17/15 with resulting wound infection-continue wound care., Continue Augmentin twice a day as per ID-supposed to stop treatment 03/29/15? 3. Rheumatoid arthritis on Humira-outpatient follow-up needed 4. Hypertension continue ample 2.5 daily 5. Anemia of chronic disease-continue ferrous sulfate 325 daily   Up with therapy Await echocardiogram Likely home soon   Verneita Griffes, MD  [facility] Pager [phone]   03/20/2015, 7:23 AM

## 2015-03-20 NOTE — Progress Notes (Signed)
STROKE TEAM PROGRESS NOTE   HISTORY OF PRESENT ILLNESS [name] is a 70 y.o. female with a history of hypertension, rheumatoid arthritis who presents with transient right facial weakness and numbness. She states that she awoke this morning with these symptoms. She went to [facility] where facial weakness was seen and documented, and the suspicion of Bell's palsy was raised. Given that she had numbness as well, however, [facility].    LKW: 3/10 10 PM tpa given?: no, resolved symptoms   SUBJECTIVE (INTERVAL HISTORY) No family members present. The patient states she is back to baseline. She had episode of right lip and cheek numbness but no eye closure problem. Stated that she still feel some numbness at the right cheek now. She denies any HA at the time of event and no hx of migraine.    OBJECTIVE Temp:  [97.9 F (36.6 C)-98.7 F (37.1 C)] 97.9 F (36.6 C) (03/12 1428) Pulse Rate:  [65-86] 79 (03/12 1428) Cardiac Rhythm:  [-] Normal sinus rhythm (03/12 1143) Resp:  [11-21] 20 (03/12 1428) BP: (103-129)/(54-79) 111/63 mmHg (03/12 1428) SpO2:  [98 %-100 %] 100 % (03/12 1428) Weight:  [98.2 kg (216 lb 7.9 oz)] 98.2 kg (216 lb 7.9 oz) (03/11 2330)  CBC:   Recent Labs Lab 03/19/15 1052 03/19/15 1104  WBC  --  9.8  NEUTROABS  --  6.7  HGB 12.9 11.3*  HCT 38.0 35.7*  MCV  --  90.8  PLT  --  [id number]    Basic Metabolic Panel:   Recent Labs Lab 03/19/15 1052 03/19/15 1104  NA 140 140  K 3.9 4.3  CL 101 103  CO2  --  29  GLUCOSE 85 83  BUN 21* 22*  CREATININE 1.00 0.97  CALCIUM  --  9.6    Lipid Panel:     Component Value Date/Time   CHOL 166 03/20/2015 0117   TRIG 55 03/20/2015 0117   HDL 66 03/20/2015 0117   CHOLHDL 2.5 03/20/2015 0117   VLDL 11 03/20/2015 0117   LDLCALC 89 03/20/2015 0117   HgbA1c:  Lab Results  Component Value Date   HGBA1C * 01/16/2010    6.0 (NOTE)                                                                        According to the ADA Clinical Practice Recommendations for 2011, when HbA1c is used as a screening test:   >=6.5%   Diagnostic of Diabetes Mellitus           (if abnormal result  is confirmed)  5.7-6.4%   Increased risk of developing Diabetes Mellitus  References:Diagnosis and Classification of Diabetes Mellitus,Diabetes [id number] 1):S62-S69 and Standards of Medical Care in         Diabetes - 2011,Diabetes Care,2011,34  (Suppl 1):S11-S61.   Urine Drug Screen:     Component Value Date/Time   LABOPIA NONE DETECTED 03/20/2015 0815   COCAINSCRNUR NONE DETECTED 03/20/2015 0815   LABBENZ NONE DETECTED 03/20/2015 0815   AMPHETMU NONE DETECTED 03/20/2015 0815   THCU NONE DETECTED 03/20/2015 0815   LABBARB NONE DETECTED 03/20/2015 0815      IMAGING I have personally reviewed  the radiological images below and agree with the radiology interpretations.  Ct Angio Head and Neack W/cm &/or Wo Cm 03/19/2015   CTA NECK:  No acute vascular process or high-grade stenosis on the neck on this delayed phase examination. Grade 1/2 C4-5 anterolisthesis on degenerative basis resulting in severe bilateral C4-5 neural foraminal narrowing.  CTA HEAD:  No emergent large vessel occlusion or high-grade stenosis on this delayed phase examination. Mild stenosis LEFT M2 origin.   Ct Head Wo Contrast 03/19/2015   1. Negative CT of the head pre   Mr [name] Wo Contrast 03/19/2015   Unremarkable appearance of the brain for age.   TTE - pending   PHYSICAL EXAM  Temp:  [97.9 F (36.6 C)-98.7 F (37.1 C)] 98.1 F (36.7 C) (03/12 1730) Pulse Rate:  [65-86] 84 (03/12 1730) Resp:  [11-21] 20 (03/12 1730) BP: (103-134)/(54-79) 134/73 mmHg (03/12 1730) SpO2:  [98 %-100 %] 99 % (03/12 1730) Weight:  [216 lb 7.9 oz (98.2 kg)] 216 lb 7.9 oz (98.2 kg) (03/11 2330)  General - Well nourished, well developed, in no apparent distress.  Ophthalmologic - Sharp disc margins OU.   Cardiovascular -  Regular rate and rhythm with no murmur.  Mental Status -  Level of arousal and orientation to time, place, and person were intact. Language including expression, naming, repetition, comprehension was assessed and found intact. Fund of Knowledge was assessed and was intact.  Cranial Nerves II - XII - II - Visual field intact OU. III, IV, VI - Extraocular movements intact. V - Facial sensation exam showed left cheek mild decreased light touch. VII - Facial movement intact bilaterally. VIII - Hearing & vestibular intact bilaterally. X - Palate elevates symmetrically. XI - Chin turning & shoulder shrug intact bilaterally. XII - Tongue protrusion intact.  Motor Strength - The patient's strength was normal in all extremities and pronator drift was absent.  Bulk was normal and fasciculations were absent.   Motor Tone - Muscle tone was assessed at the neck and appendages and was normal.  Reflexes - The patient's reflexes were 1+ in all extremities and she had no pathological reflexes.  Sensory - Light touch, temperature/pinprick were assessed and were symmetrical.    Coordination - The patient had normal movements in the hands and feet with no ataxia or dysmetria.  Tremor was absent.  Gait and Station - The patient's transfers, posture, gait, station, and turns were observed as normal.    ASSESSMENT/PLAN Ms. [name] is a 70 y.o. female with history of hypertension, OSA on CPAP, congestive heart failure, and rheumatoid arthritis presenting with transient right facial weakness and numbness.  She did not receive IV t-PA due to resolution of deficits.   Possible left brain TIA - risk factor including HTN, CHF, OSA.  Resultant  resolution of deficits  MRI  no acute findings  CTA of head and neck - no significant stenosis.  2D Echo - pending  LDL - 89  HgbA1c pending   UDS - negative  VTE prophylaxis - Lovenox Diet regular Room service appropriate?: Yes; Fluid  consistency:: Thin  No antithrombotic prior to admission, now on aspirin 325 mg daily. Continue ASA on discharge.  Patient counseled to be compliant with her antithrombotic medications  Ongoing aggressive stroke risk factor management  Therapy recommendations: Pending  Disposition:  Pending  Hypertension  Blood pressure tends to run mildly low  Hyperlipidemia  Home meds:  No lipid lowering medications prior to admission.  LDL  89, goal < 70  Now on Lipitor 10 mg daily  Continue statin at discharge  Other Stroke Risk Factors  Advanced age  Cigarette smoker, quit smoking 35 years ago.  Obesity, Body mass index is 42.28 kg/(m^2).   Family hx stroke (sister)  Obstructive sleep apnea, on CPAP at home  Other Active Problems  Mild anemia  Hospital day #   Neurology will sign off. Please call with questions. Pt will follow up with Cecille Rubin NP at [facility] in about 2 months. Thanks for the consult.  Rosalin Hawking, MD PhD Stroke Neurology 03/20/2015 6:00 PM   To contact Stroke Continuity provider, please refer to [email or web address] After hours, contact General Neurology

## 2015-03-21 DIAGNOSIS — D649 Anemia, unspecified: Secondary | ICD-10-CM | POA: Diagnosis not present

## 2015-03-21 DIAGNOSIS — M869 Osteomyelitis, unspecified: Secondary | ICD-10-CM | POA: Diagnosis not present

## 2015-03-21 DIAGNOSIS — I10 Essential (primary) hypertension: Secondary | ICD-10-CM | POA: Diagnosis not present

## 2015-03-21 DIAGNOSIS — M4806 Spinal stenosis, lumbar region: Secondary | ICD-10-CM | POA: Diagnosis not present

## 2015-03-21 LAB — HEMOGLOBIN A1C
Hgb A1c MFr Bld: 5.6 % (ref 4.8–5.6)
Mean Plasma Glucose: 114 mg/dL

## 2015-03-21 MED ORDER — ATORVASTATIN CALCIUM 10 MG PO TABS: 10.0000 mg | ORAL_TABLET | Freq: Every day | ORAL | Status: DC

## 2015-03-21 MED ORDER — ASPIRIN 325 MG PO TABS: 325.0000 mg | ORAL_TABLET | Freq: Every day | ORAL | Status: DC

## 2015-03-21 NOTE — Consult Note (Signed)
WOC wound consult note Reason for Consult: NPWT VAC dressing change, lumbar wound Wound type: surgical wound from I&D 02/04/15; lumbar wound Measurement: 5cm x 3cm x 3cm with tunneling at 12 o'clock that is 3 cm  Wound bed:95% pink, moist granulation tissue, 5% white at proximal end of wound bed base  Drainage (amount, consistency, odor) minimal in canister Periwound: intact  Dressing procedure/placement/frequency: 1pc of black foam used to fill space and tuck up into the tunneled area.  Periwound skin prepped with skin prep prior to new dressing placement.  Seal at 184mmHG.  Pt tolerated well.   Plan for NPWT VAC dressing changes M/W/F.  Patient reports she may DC to home today or tom.  I have verified her home unit is in the room with canister and I have plugged it in to charge.  Orders written for bedside staff to connect dressing to home unit for DC to home.  Discussed POC with patient and bedside nurse.  Re consult if needed, will not follow at this time. Thanks  Lucita Montoya [place], [place] [phone])

## 2015-03-21 NOTE — Discharge Summary (Signed)
Physician Discharge Summary  [name] [id number] DOB: 01-15-1945 DOA: 03/19/2015  PCP: Maggie Font, MD  Admit date: 03/19/2015 Discharge date: 03/21/2015  Time spent: 27minutes  Recommendations for Outpatient Follow-up:  1. Follow up with Neurology in 4- 6 weeks.   Discharge Diagnoses:  Principal Problem:   TIA (transient ischemic attack) Active Problems:   HLD (hyperlipidemia)   Morbid obesity (HCC)   ANEMIA, NORMOCYTIC   Essential hypertension   History of lumbar laminectomy for spinal cord decompression   Lumbar spinal stenosis   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HCC)   Lip numbness   Discharge Condition: stable  Diet recommendation: heart healthy  Filed Weights   03/19/15 1048 03/19/15 2330  Weight: 98.431 kg (217 lb) 98.2 kg (216 lb 7.9 oz)    History of present illness:  70 y.o. female with PMH of hypertension, OSA not on CPAP, anemia, arthritis, rheumatoid arthritis, chronic back pain, recent lumbar decompression surgery and post surgery wound infection on VAC, who presents with right facial droop and R lip numbness.  Hospital Course:  TIA (transient ischemic attack) HgbA1c pending, fasting lipid panel HDL > 40, LDL < 70. Cont statins MRI, MRA of the brain without contrast no acute findings. PT evaluated the patient rec no follow up. Echocardiogram EF of [id number] grade 1 diastolic heart failure with mild concentric hypertrophy. CT angio of the head and neck No acute findings, mild stenosis LEFT M2 origin. Started on  Aspirin - dose 325 mg PO daily  Cardiac Monitoring, No evidence on telemetry.  HLD (hyperlipidemia): Cont statins.  Essential hypertension: Continue ramipril and HCTZ.  Wound infection and lumbar spine after lumbar laminectomy for spinal cord decompression: Wound VAC in place, continue Augmentin , Norco and Robaxin as an outpatient with ortho. She has remained afebrile with no leukocytosis.  Morbid obesity  ([place]) Counseling.  Procedures:  CT angio head and neck  CT head  MRI brain  Consultations:  Neurology  Discharge Exam: Filed Vitals:   03/21/15 1037 03/21/15 1430  BP: 113/65 136/69  Pulse: 74 84  Temp: 97.9 F (36.6 C) 98 F (36.7 C)  Resp: 17 17    General: See progress note  Discharge Instructions   Discharge Instructions    Ambulatory referral to Neurology    Complete by:  As directed   Follow up with Cecille Rubin NP at [facility] in 2 months. Thanks.     Diet - low sodium heart healthy    Complete by:  As directed      Increase activity slowly    Complete by:  As directed           Current Discharge Medication List    START taking these medications   Details  aspirin 325 MG tablet Take 1 tablet (325 mg total) by mouth daily.    atorvastatin (LIPITOR) 10 MG tablet Take 1 tablet (10 mg total) by mouth daily at 6 PM. Qty: 30 tablet, Refills: 0      CONTINUE these medications which have NOT CHANGED   Details  amoxicillin-clavulanate (AUGMENTIN) 875-125 MG tablet Take 1 tablet by mouth 2 (two) times daily. Qty: 42 tablet, Refills: 0   Associated Diagnoses: Postoperative wound infection, sequela    ferrous sulfate 325 (65 FE) MG EC tablet Take 325 mg by mouth daily with breakfast.    HUMIRA PEN 40 MG/0.8ML PNKT Inject 40 mg as directed every 14 (fourteen) days. Reported on 02/05/2015    hydrochlorothiazide (HYDRODIURIL) 25 MG tablet Take 25  mg by mouth daily.    HYDROcodone-acetaminophen (NORCO) 10-325 MG tablet Take 1 tablet by mouth every 6 (six) hours as needed. Qty: 60 tablet, Refills: 0    methocarbamol (ROBAXIN) 500 MG tablet Take 1 tablet (500 mg total) by mouth every 6 (six) hours as needed for muscle spasms. Qty: 60 tablet, Refills: 0    Multiple Vitamin (MULTIVITAMIN WITH MINERALS) TABS tablet Take 1 tablet by mouth daily.    polyethylene glycol (MIRALAX / GLYCOLAX) packet Take 17 g by mouth daily. Qty: 30 each, Refills: 1    ramipril  (ALTACE) 2.5 MG capsule Take 2.5 mg by mouth daily.    traMADol (ULTRAM) 50 MG tablet Take 1 tablet by mouth every 6 (six) hours as needed for moderate pain.        No Known Allergies Follow-up Information    Follow up with Dennie Bible, NP. Schedule an appointment as soon as possible for a visit in 1 month.   Specialty:  Family Medicine   Why:  stroke clinic   Contact information:   [address] [place] [address] [phone]       Follow up with Maggie Font, MD In 2 weeks.   Specialty:  Family Medicine   Why:  hospital follow up   Contact information:   [address] [place] [address] [phone]        The results of significant diagnostics from this hospitalization (including imaging, microbiology, ancillary and laboratory) are listed below for reference.    Significant Diagnostic Studies: Ct Angio Head W/cm &/or Wo Cm  03/19/2015  CLINICAL DATA:  Follow-up stroke.  History of hypertension. EXAM: CT ANGIOGRAPHY HEAD AND NECK TECHNIQUE: Multidetector CT imaging of the head and neck was performed using the standard protocol during bolus administration of intravenous contrast. Multiplanar CT image reconstructions and MIPs were obtained to evaluate the vascular anatomy. Carotid stenosis measurements (when applicable) are obtained utilizing NASCET criteria, using the distal internal carotid diameter as the denominator. CONTRAST:  187mL OMNIPAQUE IOHEXOL 350 MG/ML SOLN COMPARISON:  MRI of the brain March 11th 2017 at 1705 hours FINDINGS: CT HEAD No abnormal intracranial enhancement. CTA NECK (mildly delayed bolus timing) Aortic arch: Normal appearance of the thoracic arch, normal branch pattern. The origins of the innominate, left Common carotid artery and subclavian artery are widely patent. Right carotid system: Common carotid artery is widely patent, coursing in a straight line fashion. Normal appearance of the carotid bifurcation without  hemodynamically significant stenosis by NASCET criteria. Normal appearance of the included internal carotid artery. Left carotid system: Common carotid artery is widely patent, coursing in a straight line fashion. Normal appearance of the carotid bifurcation without hemodynamically significant stenosis by NASCET criteria. Normal appearance of the included internal carotid artery. Vertebral arteries:Delayed bolus timing limits assessment of the vertebral artery origins, bilateral vertebral arteries are patent. Skeleton: No acute osseous process though bone windows have not been submitted. Grade 1/2 C4-5 anterolisthesis, associated with severe facet arthropathy and severe C4-5 neural mild C4-5 canal stenosis. Foraminal narrowing. Severe degenerative change of the included RIGHT shoulder. Partially imaged loose bodies RIGHT shoulder. Other neck: Soft tissues of the neck are nonacute though, not tailored for evaluation. Subcentimeter superior mediastinal lymph nodes are likely reactive. Enlarged heterogeneous thyroid most compatible with multinodular goiter. CTA HEAD (Mildly delayed bolus timing) Anterior circulation: Normal appearance of the cervical internal carotid arteries, petrous, cavernous and supra clinoid internal carotid arteries. Widely patent anterior communicating artery. Patent  anterior and middle cerebral arteries. Mild stenosis LEFT M2 origin. Posterior circulation: Normal appearance of the vertebral arteries, vertebrobasilar junction and basilar artery. RIGHT vertebral artery terminates in the posterior inferior cerebellar artery. Bilateral posterior communicating artery infundibulum. Normal appearance of the posterior cerebral arteries. No large vessel occlusion, hemodynamically significant stenosis, dissection, definite luminal irregularity, contrast extravasation or aneurysm within the anterior nor posterior circulation. IMPRESSION: CTA NECK: No acute vascular process or high-grade stenosis on the  neck on this delayed phase examination. Grade 1/2 C4-5 anterolisthesis on degenerative basis resulting in severe bilateral C4-5 neural foraminal narrowing. CTA HEAD: No emergent large vessel occlusion or high-grade stenosis on this delayed phase examination. Mild stenosis LEFT M2 origin. Electronically Signed   By: Elon Alas M.D.   On: 03/19/2015 23:19   Ct Head Wo Contrast  03/19/2015  CLINICAL DATA:  Numbness and tingling in her lips. Right-sided facial drooping. Symptoms began upon waking today at 8:30 a.m. EXAM: CT HEAD WITHOUT CONTRAST TECHNIQUE: Contiguous axial images were obtained from the base of the skull through the vertex without intravenous contrast. COMPARISON:  CT head without contrast 08/08/2011. FINDINGS: No acute cortical infarct, hemorrhage, or mass lesion is present. The ventricles are of normal size. No significant extra-axial fluid collection is evident. The paranasal sinuses and mastoid air cells are clear. The calvarium is intact. No significant extracranial soft tissue lesion is present. The globes and orbits are intact. IMPRESSION: 1. Negative CT of the head pre Electronically Signed   By: San Morelle M.D.   On: 03/19/2015 11:43   Ct Angio Neck W/cm &/or Wo/cm  03/19/2015  CLINICAL DATA:  Follow-up stroke.  History of hypertension. EXAM: CT ANGIOGRAPHY HEAD AND NECK TECHNIQUE: Multidetector CT imaging of the head and neck was performed using the standard protocol during bolus administration of intravenous contrast. Multiplanar CT image reconstructions and MIPs were obtained to evaluate the vascular anatomy. Carotid stenosis measurements (when applicable) are obtained utilizing NASCET criteria, using the distal internal carotid diameter as the denominator. CONTRAST:  120mL OMNIPAQUE IOHEXOL 350 MG/ML SOLN COMPARISON:  MRI of the brain March 11th 2017 at 1705 hours FINDINGS: CT HEAD No abnormal intracranial enhancement. CTA NECK (mildly delayed bolus timing) Aortic  arch: Normal appearance of the thoracic arch, normal branch pattern. The origins of the innominate, left Common carotid artery and subclavian artery are widely patent. Right carotid system: Common carotid artery is widely patent, coursing in a straight line fashion. Normal appearance of the carotid bifurcation without hemodynamically significant stenosis by NASCET criteria. Normal appearance of the included internal carotid artery. Left carotid system: Common carotid artery is widely patent, coursing in a straight line fashion. Normal appearance of the carotid bifurcation without hemodynamically significant stenosis by NASCET criteria. Normal appearance of the included internal carotid artery. Vertebral arteries:Delayed bolus timing limits assessment of the vertebral artery origins, bilateral vertebral arteries are patent. Skeleton: No acute osseous process though bone windows have not been submitted. Grade 1/2 C4-5 anterolisthesis, associated with severe facet arthropathy and severe C4-5 neural mild C4-5 canal stenosis. Foraminal narrowing. Severe degenerative change of the included RIGHT shoulder. Partially imaged loose bodies RIGHT shoulder. Other neck: Soft tissues of the neck are nonacute though, not tailored for evaluation. Subcentimeter superior mediastinal lymph nodes are likely reactive. Enlarged heterogeneous thyroid most compatible with multinodular goiter. CTA HEAD (Mildly delayed bolus timing) Anterior circulation: Normal appearance of the cervical internal carotid arteries, petrous, cavernous and supra clinoid internal carotid arteries. Widely patent anterior communicating artery. Patent anterior and  middle cerebral arteries. Mild stenosis LEFT M2 origin. Posterior circulation: Normal appearance of the vertebral arteries, vertebrobasilar junction and basilar artery. RIGHT vertebral artery terminates in the posterior inferior cerebellar artery. Bilateral posterior communicating artery infundibulum.  Normal appearance of the posterior cerebral arteries. No large vessel occlusion, hemodynamically significant stenosis, dissection, definite luminal irregularity, contrast extravasation or aneurysm within the anterior nor posterior circulation. IMPRESSION: CTA NECK: No acute vascular process or high-grade stenosis on the neck on this delayed phase examination. Grade 1/2 C4-5 anterolisthesis on degenerative basis resulting in severe bilateral C4-5 neural foraminal narrowing. CTA HEAD: No emergent large vessel occlusion or high-grade stenosis on this delayed phase examination. Mild stenosis LEFT M2 origin. Electronically Signed   By: Elon Alas M.D.   On: 03/19/2015 23:19   Mr Jeri Cos [id number] Contrast  03/19/2015  CLINICAL DATA:  Right-sided facial droop and right arm weakness. Right-sided lip tingling. EXAM: MRI HEAD WITHOUT AND WITH CONTRAST TECHNIQUE: Multiplanar, multiecho pulse sequences of the brain and surrounding structures were obtained without and with intravenous contrast. CONTRAST:  62mL MULTIHANCE GADOBENATE DIMEGLUMINE 529 MG/ML IV SOLN COMPARISON:  Head CT 03/19/2015 and MRI 07/19/2005 FINDINGS: There is no evidence of acute infarct, intracranial hemorrhage, mass, midline shift, or extra-axial fluid collection. Ventricles and sulci are within normal limits for age. Several punctate foci of T2 hyperintensity in the cerebral white matter are similar to the prior MRI, nonspecific, and not greater than expected for age. No abnormal enhancement is identified. Orbits are unremarkable. There is a small amount of fluid in the left sphenoid sinus. The mastoid air cells are clear. Major intracranial vascular flow voids are preserved, with the right vertebral artery appearing hypoplastic. Multilevel cervical spondylosis is partially visualized, and there is grade 1 anterolisthesis of C4 on C5. IMPRESSION: Unremarkable appearance of the brain for age. Electronically Signed   By: Logan Bores M.D.   On:  03/19/2015 18:17    Microbiology: No results found for this or any previous visit (from the past 240 hour(s)).   Labs: Basic Metabolic Panel:  Recent Labs Lab 03/19/15 1052 03/19/15 1104  NA 140 140  K 3.9 4.3  CL 101 103  CO2  --  29  GLUCOSE 85 83  BUN 21* 22*  CREATININE 1.00 0.97  CALCIUM  --  9.6   Liver Function Tests:  Recent Labs Lab 03/19/15 1104  AST 24  ALT 16  ALKPHOS 97  BILITOT 0.3  PROT 7.3  ALBUMIN 3.4*   No results for input(s): LIPASE, AMYLASE in the last 168 hours. No results for input(s): AMMONIA in the last 168 hours. CBC:  Recent Labs Lab 03/19/15 1052 03/19/15 1104  WBC  --  9.8  NEUTROABS  --  6.7  HGB 12.9 11.3*  HCT 38.0 35.7*  MCV  --  90.8  PLT  --  330   Cardiac Enzymes: No results for input(s): CKTOTAL, CKMB, CKMBINDEX, TROPONINI in the last 168 hours. BNP: BNP (last 3 results) No results for input(s): BNP in the last 8760 hours.  ProBNP (last 3 results) No results for input(s): PROBNP in the last 8760 hours.  CBG:  Recent Labs Lab 03/19/15 1045  GLUCAP 71      Signed:  Charlynne Cousins MD.  [facility] 03/21/2015, 3:47 PM

## 2015-03-21 NOTE — Care Management Note (Signed)
Case Management Note  Patient Details  Name: [name] MRN: [id number] Date of Birth: [birth year]  Subjective/Objective:                    Action/Plan: Patient discharging home today. Patient already active with [facility]. Tiffany with [facility] notified of patients returning home. No further needs per CM.   Expected Discharge Date:                  Expected Discharge Plan:     In-House Referral:     Discharge planning Services     Post Acute Care Choice:    Choice offered to:     DME Arranged:    DME Agency:     HH Arranged:    [place] Agency:     Status of Service:     Medicare Important Message Given:    Date Medicare IM Given:    Medicare IM give by:    Date Additional Medicare IM Given:    Additional Medicare Important Message give by:     If discussed at [place] of Stay Meetings, dates discussed:    Additional Comments:  Pollie Friar, RN 03/21/2015, 3:46 PM

## 2015-03-21 NOTE — Evaluation (Signed)
Physical Therapy Evaluation and D/C Patient Details Name: [name] MRN: [id number] DOB: 1945/04/02 Today's Date: 03/21/2015   History of Present Illness  [name] is a 70 y.o. female with a history of hypertension, rheumatoid arthritis who presents with transient right facial weakness and numbness. She states that she awoke this morning with these symptoms. She went to [facility] where facial weakness was seen and documented, and the suspicion of Bell's palsy was raised. Given that she had numbness as well, however, [facility]. L2-4 spinal decompression in Jan with dural tear and wound infection with need for VAC placement.    Clinical Impression  Pt admitted with above diagnosis. Pt currently without significant functional limitations and is able to ambulate with RW with Modif I and does not need PT f/u.  Has all needed equipment and 24 hour care as well. Will sign off.    Follow Up Recommendations No PT follow up;Supervision - Intermittent    Equipment Recommendations  None recommended by PT    Recommendations for Other Services       Precautions / Restrictions Precautions Precautions: Back Restrictions Weight Bearing Restrictions: No      Mobility  Bed Mobility Overal bed mobility: Independent                Transfers Overall transfer level: Modified independent                  Ambulation/Gait Ambulation/Gait assistance: Modified independent (Device/Increase time) Ambulation Distance (Feet): 110 Feet Assistive device: Rolling walker (2 wheeled) Gait Pattern/deviations: Step-through pattern;Decreased stride length;Trunk flexed   Gait velocity interpretation: <1.8 ft/sec, indicative of risk for recurrent falls General Gait Details: Encouraged pt to stand up tall. Pt was able to respond to verbal cuing.  Overall good gait pattern and good safety with RW.  Pt with no LOB in controlled environment.  Did cue pt to stand up  tall as she bends over at times due to fatigue.    Stairs            Wheelchair Mobility    Modified Rankin (Stroke Patients Only)       Balance Overall balance assessment: Needs assistance Sitting-balance support: No upper extremity supported;Feet supported Sitting balance-Leahy Scale: Good     Standing balance support: Bilateral upper extremity supported;During functional activity Standing balance-Leahy Scale: Fair Standing balance comment: can stand statically without RW.              High level balance activites: Turns;Direction changes High Level Balance Comments: No difficulties with Modif I  with RW.             Pertinent Vitals/Pain Pain Assessment: No/denies pain  VSS    Home Living Family/patient expects to be discharged to:: Private residence Living Arrangements: Spouse/significant other Available Help at Discharge: Family;Available 24 hours/day Type of Home: House Home Access: Stairs to enter Entrance Stairs-Rails: None Entrance Stairs-Number of Steps: 3 Home Layout: One level Home Equipment: Walker - 2 wheels;Bedside commode;Tub bench;Cane - single point Additional Comments: Pt has steps to get up onto bed as well    Prior Function Level of Independence: Needs assistance      ADL's / Homemaking Assistance Needed: spouse helping with ADLs since back sx  Comments: was walking in house with RW, relying on grocery cart for assist in stores.      Hand Dominance        Extremity/Trunk Assessment   Upper  Extremity Assessment: Defer to OT evaluation           Lower Extremity Assessment: Generalized weakness      Cervical / Trunk Assessment: Kyphotic  Communication   Communication: No difficulties  Cognition Arousal/Alertness: Awake/alert Behavior During Therapy: WFL for tasks assessed/performed Overall Cognitive Status: Within Functional Limits for tasks assessed                      General Comments General  comments (skin integrity, edema, etc.): VAC in place on back wound.    Exercises        Assessment/Plan    PT Assessment Patent does not need any further PT services  PT Diagnosis     PT Problem List    PT Treatment Interventions     PT Goals (Current goals can be found in the Care Plan section) Acute Rehab PT Goals PT Goal Formulation: All assessment and education complete, DC therapy    Frequency     Barriers to discharge        Co-evaluation               End of Session Equipment Utilized During Treatment: Gait belt (wound vac on back wound) Activity Tolerance: Patient tolerated treatment well Patient left: in chair;with call bell/phone within reach;with family/visitor present Nurse Communication: Mobility status    Functional Assessment Tool Used: clinical judgment Functional Limitation: Mobility: Walking and moving around Mobility: Walking and Moving Around Current Status [phone]): 0 percent impaired, limited or restricted Mobility: Walking and Moving Around Goal Status [phone]): 0 percent impaired, limited or restricted Mobility: Walking and Moving Around Discharge Status [phone]): 0 percent impaired, limited or restricted    Time: [id number] PT Time Calculation (min) (ACUTE ONLY): 15 min   Charges:   PT Evaluation $PT Eval Moderate Complexity: 1 Procedure     PT G Codes:   PT G-Codes **NOT FOR INPATIENT CLASS** Functional Assessment Tool Used: clinical judgment Functional Limitation: Mobility: Walking and moving around Mobility: Walking and Moving Around Current Status [id number]): 0 percent impaired, limited or restricted Mobility: Walking and Moving Around Goal Status [id number]): 0 percent impaired, limited or restricted Mobility: Walking and Moving Around Discharge Status [id number]): 0 percent impaired, limited or restricted    Irwin Brakeman F 03/21/2015, 4:00 PM [facility],PT Acute Rehabilitation [phone] (pager)

## 2015-03-21 NOTE — Care Management Obs Status (Signed)
[facility] NOTIFICATION   Patient Details  Name: [name] MRN: [id number] Date of Birth: [birth year]   Medicare Observation Status Notification Given:  Yes (MRI results negative)    Pollie Friar, RN 03/21/2015, 1:58 PM

## 2015-03-21 NOTE — Progress Notes (Signed)
[facility] PROGRESS NOTE    Progress Note   [name] [id number] DOB: 29-Jun-1945 DOA: 03/19/2015 PCP: Maggie Font, MD   Brief Narrative:   [name] is an 70 y.o. female  Past medical history of hypertension and obstructive sleep apnea woke up this morning with facial droop and right lip numbness in the morning. Which was resolved when she got to the ED.  Assessment/Plan:   TIA (transient ischemic attack) HgbA1c pending, fasting lipid panel HDL > 40, LDL < 70. Cont statins MRI, MRA of the brain without contrast no acute findings. PT, OT, Speech consult  Echocardiogram  EF of [id number] grade 1 diastolic heart failure with mild concentric hypertrophy. CT angio of the head and neck  No acute findings,Mild stenosis LEFT M2 origin. Prophylactic therapy-Antiplatelet med: Aspirin - dose 325 mg PO daily  Cardiac Monitoring,  No evidence on telemetry.  PT evaluation is pending. Neurochecks q4h   HLD (hyperlipidemia): Cont statins.  Essential hypertension: Continue ramipril , continue to hold hydrochlorothiazide.  Wound infection and lumbar spine after lumbar laminectomy for spinal cord decompression: Wound VAC in place, continue Augmentin , Norco and Robaxin. She has remained afebrile with no leukocytosis, PT evaluation is pending.  Morbid obesity ([place]) Counseling.  ANEMIA, NORMOCYTIC Stable, with mild fluctuations.    DVT Prophylaxis - Lovenox ordered.  Family Communication: none Disposition Plan: d/c in 1-2 days Code Status:     Code Status Orders        Start     Ordered   03/19/15 2007  Full code   Continuous     03/19/15 2008    Code Status History    Date Active Date Inactive Code Status Order ID Comments User Context   01/17/2015  9:59 PM 01/21/2015  8:40 PM Full Code [id number]  [name], PA-C Inpatient   03/25/2014  5:44 PM 03/27/2014  5:35 PM Full Code [id number]  [name], PA-C Inpatient        IV Access:     Peripheral IV   Procedures and diagnostic studies:   Ct Angio Head W/cm &/or Wo Cm  03/19/2015  CLINICAL DATA:  Follow-up stroke.  History of hypertension. EXAM: CT ANGIOGRAPHY HEAD AND NECK TECHNIQUE: Multidetector CT imaging of the head and neck was performed using the standard protocol during bolus administration of intravenous contrast. Multiplanar CT image reconstructions and MIPs were obtained to evaluate the vascular anatomy. Carotid stenosis measurements (when applicable) are obtained utilizing NASCET criteria, using the distal internal carotid diameter as the denominator. CONTRAST:  189mL OMNIPAQUE IOHEXOL 350 MG/ML SOLN COMPARISON:  MRI of the brain March 11th 2017 at 1705 hours FINDINGS: CT HEAD No abnormal intracranial enhancement. CTA NECK (mildly delayed bolus timing) Aortic arch: Normal appearance of the thoracic arch, normal branch pattern. The origins of the innominate, left Common carotid artery and subclavian artery are widely patent. Right carotid system: Common carotid artery is widely patent, coursing in a straight line fashion. Normal appearance of the carotid bifurcation without hemodynamically significant stenosis by NASCET criteria. Normal appearance of the included internal carotid artery. Left carotid system: Common carotid artery is widely patent, coursing in a straight line fashion. Normal appearance of the carotid bifurcation without hemodynamically significant stenosis by NASCET criteria. Normal appearance of the included internal carotid artery. Vertebral arteries:Delayed bolus timing limits assessment of the vertebral artery origins, bilateral vertebral arteries are patent. Skeleton: No acute osseous process though bone windows have not been submitted. Grade 1/2 C4-5  anterolisthesis, associated with severe facet arthropathy and severe C4-5 neural mild C4-5 canal stenosis. Foraminal narrowing. Severe degenerative change of the included RIGHT shoulder. Partially imaged  loose bodies RIGHT shoulder. Other neck: Soft tissues of the neck are nonacute though, not tailored for evaluation. Subcentimeter superior mediastinal lymph nodes are likely reactive. Enlarged heterogeneous thyroid most compatible with multinodular goiter. CTA HEAD (Mildly delayed bolus timing) Anterior circulation: Normal appearance of the cervical internal carotid arteries, petrous, cavernous and supra clinoid internal carotid arteries. Widely patent anterior communicating artery. Patent anterior and middle cerebral arteries. Mild stenosis LEFT M2 origin. Posterior circulation: Normal appearance of the vertebral arteries, vertebrobasilar junction and basilar artery. RIGHT vertebral artery terminates in the posterior inferior cerebellar artery. Bilateral posterior communicating artery infundibulum. Normal appearance of the posterior cerebral arteries. No large vessel occlusion, hemodynamically significant stenosis, dissection, definite luminal irregularity, contrast extravasation or aneurysm within the anterior nor posterior circulation. IMPRESSION: CTA NECK: No acute vascular process or high-grade stenosis on the neck on this delayed phase examination. Grade 1/2 C4-5 anterolisthesis on degenerative basis resulting in severe bilateral C4-5 neural foraminal narrowing. CTA HEAD: No emergent large vessel occlusion or high-grade stenosis on this delayed phase examination. Mild stenosis LEFT M2 origin. Electronically Signed   By: Elon Alas M.D.   On: 03/19/2015 23:19   Ct Head Wo Contrast  03/19/2015  CLINICAL DATA:  Numbness and tingling in her lips. Right-sided facial drooping. Symptoms began upon waking today at 8:30 a.m. EXAM: CT HEAD WITHOUT CONTRAST TECHNIQUE: Contiguous axial images were obtained from the base of the skull through the vertex without intravenous contrast. COMPARISON:  CT head without contrast 08/08/2011. FINDINGS: No acute cortical infarct, hemorrhage, or mass lesion is present. The  ventricles are of normal size. No significant extra-axial fluid collection is evident. The paranasal sinuses and mastoid air cells are clear. The calvarium is intact. No significant extracranial soft tissue lesion is present. The globes and orbits are intact. IMPRESSION: 1. Negative CT of the head pre Electronically Signed   By: San Morelle M.D.   On: 03/19/2015 11:43   Ct Angio Neck W/cm &/or Wo/cm  03/19/2015  CLINICAL DATA:  Follow-up stroke.  History of hypertension. EXAM: CT ANGIOGRAPHY HEAD AND NECK TECHNIQUE: Multidetector CT imaging of the head and neck was performed using the standard protocol during bolus administration of intravenous contrast. Multiplanar CT image reconstructions and MIPs were obtained to evaluate the vascular anatomy. Carotid stenosis measurements (when applicable) are obtained utilizing NASCET criteria, using the distal internal carotid diameter as the denominator. CONTRAST:  176mL OMNIPAQUE IOHEXOL 350 MG/ML SOLN COMPARISON:  MRI of the brain March 11th 2017 at 1705 hours FINDINGS: CT HEAD No abnormal intracranial enhancement. CTA NECK (mildly delayed bolus timing) Aortic arch: Normal appearance of the thoracic arch, normal branch pattern. The origins of the innominate, left Common carotid artery and subclavian artery are widely patent. Right carotid system: Common carotid artery is widely patent, coursing in a straight line fashion. Normal appearance of the carotid bifurcation without hemodynamically significant stenosis by NASCET criteria. Normal appearance of the included internal carotid artery. Left carotid system: Common carotid artery is widely patent, coursing in a straight line fashion. Normal appearance of the carotid bifurcation without hemodynamically significant stenosis by NASCET criteria. Normal appearance of the included internal carotid artery. Vertebral arteries:Delayed bolus timing limits assessment of the vertebral artery origins, bilateral vertebral  arteries are patent. Skeleton: No acute osseous process though bone windows have not been submitted. Grade 1/2 C4-5 anterolisthesis,  associated with severe facet arthropathy and severe C4-5 neural mild C4-5 canal stenosis. Foraminal narrowing. Severe degenerative change of the included RIGHT shoulder. Partially imaged loose bodies RIGHT shoulder. Other neck: Soft tissues of the neck are nonacute though, not tailored for evaluation. Subcentimeter superior mediastinal lymph nodes are likely reactive. Enlarged heterogeneous thyroid most compatible with multinodular goiter. CTA HEAD (Mildly delayed bolus timing) Anterior circulation: Normal appearance of the cervical internal carotid arteries, petrous, cavernous and supra clinoid internal carotid arteries. Widely patent anterior communicating artery. Patent anterior and middle cerebral arteries. Mild stenosis LEFT M2 origin. Posterior circulation: Normal appearance of the vertebral arteries, vertebrobasilar junction and basilar artery. RIGHT vertebral artery terminates in the posterior inferior cerebellar artery. Bilateral posterior communicating artery infundibulum. Normal appearance of the posterior cerebral arteries. No large vessel occlusion, hemodynamically significant stenosis, dissection, definite luminal irregularity, contrast extravasation or aneurysm within the anterior nor posterior circulation. IMPRESSION: CTA NECK: No acute vascular process or high-grade stenosis on the neck on this delayed phase examination. Grade 1/2 C4-5 anterolisthesis on degenerative basis resulting in severe bilateral C4-5 neural foraminal narrowing. CTA HEAD: No emergent large vessel occlusion or high-grade stenosis on this delayed phase examination. Mild stenosis LEFT M2 origin. Electronically Signed   By: Elon Alas M.D.   On: 03/19/2015 23:19   Mr Jeri Cos [id number] Contrast  03/19/2015  CLINICAL DATA:  Right-sided facial droop and right arm weakness. Right-sided lip tingling.  EXAM: MRI HEAD WITHOUT AND WITH CONTRAST TECHNIQUE: Multiplanar, multiecho pulse sequences of the brain and surrounding structures were obtained without and with intravenous contrast. CONTRAST:  68mL MULTIHANCE GADOBENATE DIMEGLUMINE 529 MG/ML IV SOLN COMPARISON:  Head CT 03/19/2015 and MRI 07/19/2005 FINDINGS: There is no evidence of acute infarct, intracranial hemorrhage, mass, midline shift, or extra-axial fluid collection. Ventricles and sulci are within normal limits for age. Several punctate foci of T2 hyperintensity in the cerebral white matter are similar to the prior MRI, nonspecific, and not greater than expected for age. No abnormal enhancement is identified. Orbits are unremarkable. There is a small amount of fluid in the left sphenoid sinus. The mastoid air cells are clear. Major intracranial vascular flow voids are preserved, with the right vertebral artery appearing hypoplastic. Multilevel cervical spondylosis is partially visualized, and there is grade 1 anterolisthesis of C4 on C5. IMPRESSION: Unremarkable appearance of the brain for age. Electronically Signed   By: Logan Bores M.D.   On: 03/19/2015 18:17     Medical Consultants:    None.  Anti-Infectives:   Anti-infectives    Start     Dose/Rate Route Frequency Ordered Stop   03/19/15 2200  amoxicillin-clavulanate (AUGMENTIN) 875-125 MG per tablet 1 tablet     1 tablet Oral 2 times daily 03/19/15 2005        Subjective:    [name]  No complains.  Objective:    Filed Vitals:   03/20/15 1730 03/20/15 2235 03/21/15 0159 03/21/15 0658  BP: 134/73 121/66 113/55 103/55  Pulse: 84 84 82 79  Temp: 98.1 F (36.7 C) 98.4 F (36.9 C) 98.2 F (36.8 C) 98 F (36.7 C)  TempSrc: Oral Oral Oral Oral  Resp: 20 20 20 20   Height:      Weight:      SpO2: 99% 100% 99% 100%    Intake/Output Summary (Last 24 hours) at 03/21/15 0803 Last data filed at 03/20/15 1730  Gross per 24 hour  Intake    360 ml  Output    500  ml  Net   -140 ml   Filed Weights   03/19/15 1048 03/19/15 2330  Weight: 98.431 kg (217 lb) 98.2 kg (216 lb 7.9 oz)    Exam: Gen:  NAD Cardiovascular:  RRR. Chest and lungs:   CTAB Abdomen:  Abdomen soft, NT/ND, + BS Extremities:  No edema   Data Reviewed:    Labs: Basic Metabolic Panel:  Recent Labs Lab 03/19/15 1052 03/19/15 1104  NA 140 140  K 3.9 4.3  CL 101 103  CO2  --  29  GLUCOSE 85 83  BUN 21* 22*  CREATININE 1.00 0.97  CALCIUM  --  9.6   GFR Estimated Creatinine Clearance: 57.6 mL/min (by C-G formula based on Cr of 0.97). Liver Function Tests:  Recent Labs Lab 03/19/15 1104  AST 24  ALT 16  ALKPHOS 97  BILITOT 0.3  PROT 7.3  ALBUMIN 3.4*   No results for input(s): LIPASE, AMYLASE in the last 168 hours. No results for input(s): AMMONIA in the last 168 hours. Coagulation profile  Recent Labs Lab 03/19/15 1150  INR 0.97    CBC:  Recent Labs Lab 03/19/15 1052 03/19/15 1104  WBC  --  9.8  NEUTROABS  --  6.7  HGB 12.9 11.3*  HCT 38.0 35.7*  MCV  --  90.8  PLT  --  330   Cardiac Enzymes: No results for input(s): CKTOTAL, CKMB, CKMBINDEX, TROPONINI in the last 168 hours. BNP (last 3 results) No results for input(s): PROBNP in the last 8760 hours. CBG:  Recent Labs Lab 03/19/15 1045  GLUCAP 71   D-Dimer: No results for input(s): DDIMER in the last 72 hours. Hgb A1c: No results for input(s): HGBA1C in the last 72 hours. Lipid Profile:  Recent Labs  03/20/15 0117  CHOL 166  HDL 66  LDLCALC 89  TRIG 55  CHOLHDL 2.5   Thyroid function studies: No results for input(s): TSH, T4TOTAL, T3FREE, THYROIDAB in the last 72 hours.  Invalid input(s): FREET3 Anemia work up:  Recent Labs  03/20/15 0117  VITAMINB12 408   Sepsis Labs:  Recent Labs Lab 03/19/15 1104  WBC 9.8   Microbiology No results found for this or any previous visit (from the past 240 hour(s)).   Medications:   . amoxicillin-clavulanate  1  tablet Oral BID  . aspirin  300 mg Rectal Daily   Or  . aspirin  325 mg Oral Daily  . atorvastatin  10 mg Oral q1800  . enoxaparin (LOVENOX) injection  40 mg Subcutaneous QHS  . ferrous sulfate  325 mg Oral Q breakfast  . multivitamin with minerals  1 tablet Oral Daily  . polyethylene glycol  17 g Oral Daily  . ramipril  2.5 mg Oral Daily   Continuous Infusions:   Time spent: 15 min     Charlynne Cousins  [facility] Pager [phone]  *Please refer to [email or web address], password [email or web address] to get updated schedule on who will round on this patient, as hospitalists switch teams weekly. If 7PM-7AM, please contact night-coverage at [email or web address], password [email or web address] for any overnight needs.  03/21/2015, 8:03 AM

## 2015-04-04 ENCOUNTER — Encounter: Payer: Self-pay | Admitting: Internal Medicine

## 2015-04-04 ENCOUNTER — Ambulatory Visit (INDEPENDENT_AMBULATORY_CARE_PROVIDER_SITE_OTHER): Payer: Medicare Other | Admitting: Internal Medicine

## 2015-04-04 VITALS — BP 130/79 | HR 83 | Temp 97.8°F | Wt 215.0 lb

## 2015-04-04 DIAGNOSIS — B373 Candidiasis of vulva and vagina: Secondary | ICD-10-CM | POA: Diagnosis not present

## 2015-04-04 DIAGNOSIS — T814XXS Infection following a procedure, sequela: Secondary | ICD-10-CM | POA: Diagnosis not present

## 2015-04-04 DIAGNOSIS — B3731 Acute candidiasis of vulva and vagina: Secondary | ICD-10-CM

## 2015-04-04 DIAGNOSIS — IMO0001 Reserved for inherently not codable concepts without codable children: Secondary | ICD-10-CM

## 2015-04-04 LAB — C-REACTIVE PROTEIN: CRP: 5.1 mg/dL — ABNORMAL HIGH (ref <0.60)

## 2015-04-04 MED ORDER — MICONAZOLE NITRATE 1200 & 2 MG & % VA KIT: 1.0000 | PACK | Freq: Once | VAGINAL | Status: DC

## 2015-04-04 NOTE — Progress Notes (Signed)
RFV: follow up for polymicrobial superfical wound infection Subjective:    Patient ID: [name], female    DOB: [birth year], 70 y.o.   MRN: [id number]  HPI 70 year old female with history of rheumatoid arthritis on humira who had previous lumbar 2 level decompression on January 17, 2015. Postoperatively, she had some problems with serous drainage from her surgical incision as well as skin irritation from tape. She previously had been on Humira,but had stopped this 2 weeks prior to the surgery. the drainage from the wound progressed, at one point had noticeable foul odor with associated skin necrosis for 2-3 cm and the wound dehisced open 5-6 mm with visual Vicryl sutures present and edge skin necrosis. She underwent I x D of superficial wound infection on jan 29th by Dr. Lorin Mercy. OR cultures were polymicrobial. She was initially on amp/sub and subsequently discharged on amox/clav for which she is still taking. She is not having any difficulty except for some vaginal itching. She is looking to have her wound vac finished at the end of the week  She denies any purulent drainage, no fever ,chills, pain to back. occ dislocation of wound vac  No Known Allergies Current Outpatient Prescriptions on File Prior to Visit  Medication Sig Dispense Refill  . amoxicillin-clavulanate (AUGMENTIN) 875-125 MG tablet Take 1 tablet by mouth 2 (two) times daily. 42 tablet 0  . aspirin 325 MG tablet Take 1 tablet (325 mg total) by mouth daily.    Marland Kitchen atorvastatin (LIPITOR) 10 MG tablet Take 1 tablet (10 mg total) by mouth daily at 6 PM. 30 tablet 0  . ferrous sulfate 325 (65 FE) MG EC tablet Take 325 mg by mouth daily with breakfast.    . HUMIRA PEN 40 MG/0.8ML PNKT Inject 40 mg as directed every 14 (fourteen) days. Reported on 02/05/2015    . hydrochlorothiazide (HYDRODIURIL) 25 MG tablet Take 25 mg by mouth daily.    Marland Kitchen HYDROcodone-acetaminophen (NORCO) 10-325 MG tablet Take 1 tablet by mouth every 6 (six) hours  as needed. 60 tablet 0  . methocarbamol (ROBAXIN) 500 MG tablet Take 1 tablet (500 mg total) by mouth every 6 (six) hours as needed for muscle spasms. 60 tablet 0  . Multiple Vitamin (MULTIVITAMIN WITH MINERALS) TABS tablet Take 1 tablet by mouth daily.    . polyethylene glycol (MIRALAX / GLYCOLAX) packet Take 17 g by mouth daily. 30 each 1  . ramipril (ALTACE) 2.5 MG capsule Take 2.5 mg by mouth daily.    . traMADol (ULTRAM) 50 MG tablet Take 1 tablet by mouth every 6 (six) hours as needed for moderate pain.      No current facility-administered medications on file prior to visit.   Active Ambulatory Problems    Diagnosis Date Noted  . HLD (hyperlipidemia) 08/14/2006  . Morbid obesity ([place]) 12/13/2005  . ANEMIA, NORMOCYTIC 04/13/2008  . CARPAL TUNNEL SYNDROME 12/13/2005  . PERIPHERAL NEUROPATHY 12/13/2005  . Essential hypertension 12/13/2005  . DYSRHYTHMIA, CARDIAC NOS 12/13/2005  . CONSTIPATION 05/25/2008  . DERMATITIS 09/28/2008  . ANKLE PAIN, BILATERAL 11/25/2007  . [place] DISEASE 08/15/2006  . DYSPNEA ON EXERTION 05/25/2008  . Primary osteoarthritis of left knee 03/25/2014  . Rheumatoid arthritis of knee ([place]) 03/25/2014  . 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 History of lumbar laminectomy for spinal cord decompression 01/17/2015  . Lumbar spinal stenosis 01/17/2015  . Post op infection 02/05/2015  . 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 Lip numbness 03/19/2015  .  TIA (transient ischemic attack) 03/19/2015   Resolved Ambulatory Problems    Diagnosis Date Noted  . No Resolved Ambulatory Problems   Past Medical History  Diagnosis Date  . Hypertension   . H/O cardiovascular stress test 02/2014  . Heart murmur   . Sleep apnea   . Arthritis   . Anemia   . Shortness of breath dyspnea   . CHF (congestive heart failure) (HCC)       Review of Systems + wound, otherwise 10 point ROS is negative    Objective:   Physical Exam BP  130/79 mmHg  Pulse 83  Temp(Src) 97.8 F (36.6 C) (Oral)  Wt 215 lb (97.523 kg) Physical Exam  Constitutional:  oriented to person, place, and time. appears well-developed and well-nourished. No distress.  HENT: [place]/AT, PERRLA, no scleral icterus Mouth/Throat: Oropharynx is clear and moist. No oropharyngeal exudate.  Cardiovascular: Normal rate, regular rhythm and normal heart sounds. Exam reveals no gallop and no friction rub.  No murmur heard.  Pulmonary/Chest: Effort normal and breath sounds normal. No respiratory distress.  has no wheezes.  Skin: Skin is warm and dry. Wound vac in place. Some areas of hypopigmentation Psychiatric: a normal mood and affect.  behavior is normal.        Assessment & Plan:  Polymicrobial wound = will check sed rate and crp. Elizebeth Koller out the last remaining 2 wk of oral abtx

## 2015-04-05 LAB — SEDIMENTATION RATE

## 2015-04-06 ENCOUNTER — Telehealth: Payer: Self-pay | Admitting: *Deleted

## 2015-04-06 NOTE — Telephone Encounter (Signed)
[facility] labs called to advise that the sed rate ordered 04/04/15 was unable to be performed due to not enough blood in collection tube and they wanted to let Dr Baxter Flattery know so she could retest the patient as necessary. Will forward Dr Baxter Flattery a message to see if she would like to have the patient come back for labs.

## 2015-04-08 NOTE — Telephone Encounter (Signed)
Can you haver her come back to do sed rate

## 2015-04-12 ENCOUNTER — Other Ambulatory Visit: Payer: Self-pay | Admitting: *Deleted

## 2015-04-12 DIAGNOSIS — IMO0001 Reserved for inherently not codable concepts without codable children: Secondary | ICD-10-CM

## 2015-04-12 DIAGNOSIS — T814XXD Infection following a procedure, subsequent encounter: Principal | ICD-10-CM

## 2015-04-12 NOTE — Telephone Encounter (Signed)
Patient returned call and advised she will come to clinic for repeat Sed Rate 04/13/15 in the morning.

## 2015-04-13 ENCOUNTER — Other Ambulatory Visit: Payer: Medicare Other

## 2015-04-13 DIAGNOSIS — IMO0001 Reserved for inherently not codable concepts without codable children: Secondary | ICD-10-CM

## 2015-04-13 DIAGNOSIS — T814XXD Infection following a procedure, subsequent encounter: Principal | ICD-10-CM

## 2015-04-14 LAB — SEDIMENTATION RATE: Sed Rate: 84 mm/hr — ABNORMAL HIGH (ref 0–30)

## 2015-04-26 ENCOUNTER — Encounter: Payer: Self-pay | Admitting: Nurse Practitioner

## 2015-04-26 ENCOUNTER — Ambulatory Visit (INDEPENDENT_AMBULATORY_CARE_PROVIDER_SITE_OTHER): Payer: Medicare Other | Admitting: Nurse Practitioner

## 2015-04-26 VITALS — BP 130/73 | HR 78 | Ht 60.0 in | Wt 222.0 lb

## 2015-04-26 DIAGNOSIS — I10 Essential (primary) hypertension: Secondary | ICD-10-CM | POA: Diagnosis not present

## 2015-04-26 DIAGNOSIS — G459 Transient cerebral ischemic attack, unspecified: Secondary | ICD-10-CM | POA: Diagnosis not present

## 2015-04-26 DIAGNOSIS — E785 Hyperlipidemia, unspecified: Secondary | ICD-10-CM | POA: Diagnosis not present

## 2015-04-26 NOTE — Patient Instructions (Signed)
Control of stroke risk factors Continue aspirin every day for secondary stroke prevention Continue Lipitor for cholesterol, labs will be followed by primary care LDL on admission 89 goal would be less than 70 Keep the blood pressure systolic less than [id number]  todays reading 130/73 Continue CPAP at home Use walker at all times for safe ambulation Follow-up in 3 months

## 2015-04-26 NOTE — Progress Notes (Signed)
I reviewed above note and agree with the assessment and plan.  Rosalin Hawking, MD PhD Stroke Neurology 04/26/2015 5:56 PM

## 2015-04-26 NOTE — Progress Notes (Signed)
[facility]  PATIENT: [name] DOB: 1945-02-21   REASON FOR VISIT: Hospital follow-up for TIA  HISTORY FROM: Patient    HISTORY OF PRESENT ILLNESS:[name] is a 70 y.o. female with a history of hypertension, rheumatoid arthritis who presented  with transient right facial weakness and numbness to the hospital on 03/20/15.  She states that she awoke that morning with these symptoms. She went to [facility] where facial weakness was seen and documented, and the suspicion of Bell's palsy was raised. Given that she had numbness as well, however, [facility]. MRI and MRA  of the brain with nothing acute.  CTA of the head no large vessel occlusion mild stenosis M2 origin. 2-D echo with EF 14% grade 1 diastolic heart failure with mild hypertrophy. LDL 89 and started on Lipitor 10 mg daily. . No anti-thrombotic prior to admission now on aspirin 325 daily. Blood pressure within normal limits in the office today. No further stroke or TIA symptoms. Continuing to recover from wound infection. Ambulating with rolling walker and no recent falls.   REVIEW OF SYSTEMS: Full 14 system review of systems performed and notable only for those listed, all others are neg:  Constitutional: neg  Cardiovascular: Leg swelling Ear/Nose/Throat: neg  Skin: neg Eyes: neg Respiratory: Shortness of breath Gastroitestinal: neg  Hematology/Lymphatic: Anemia  Endocrine: neg Musculoskeletal: Walking difficulty Allergy/Immunology: neg Neurological: neg Psychiatric: neg Sleep : Obstructive sleep apnea with CPAP   ALLERGIES: No Known Allergies  HOME MEDICATIONS: Outpatient Prescriptions Prior to Visit  Medication Sig Dispense Refill  . aspirin 325 MG tablet Take 1 tablet (325 mg total) by mouth daily.    . ferrous sulfate 325 (65 FE) MG EC tablet Take 325 mg by mouth daily with breakfast.    . hydrochlorothiazide (HYDRODIURIL) 25 MG tablet Take 25  mg by mouth daily.    . methocarbamol (ROBAXIN) 500 MG tablet Take 1 tablet (500 mg total) by mouth every 6 (six) hours as needed for muscle spasms. 60 tablet 0  . Multiple Vitamin (MULTIVITAMIN WITH MINERALS) TABS tablet Take 1 tablet by mouth daily.    . polyethylene glycol (MIRALAX / GLYCOLAX) packet Take 17 g by mouth daily. 30 each 1  . ramipril (ALTACE) 2.5 MG capsule Take 2.5 mg by mouth daily.    . traMADol (ULTRAM) 50 MG tablet Take 1 tablet by mouth every 6 (six) hours as needed for moderate pain.     Marland Kitchen atorvastatin (LIPITOR) 10 MG tablet Take 1 tablet (10 mg total) by mouth daily at 6 PM. (Patient not taking: Reported on 04/26/2015) 30 tablet 0  . HUMIRA PEN 40 MG/0.8ML PNKT Inject 40 mg as directed every 14 (fourteen) days. Reported on 04/26/2015    . miconazole (MONISTAT 1 COMBINATION PACK) kit Place 1 each vaginally once. (Patient not taking: Reported on 04/26/2015) 1 each 0  . amoxicillin-clavulanate (AUGMENTIN) 875-125 MG tablet Take 1 tablet by mouth 2 (two) times daily. (Patient not taking: Reported on 04/26/2015) 42 tablet 0  . HYDROcodone-acetaminophen (NORCO) 10-325 MG tablet Take 1 tablet by mouth every 6 (six) hours as needed. 60 tablet 0   No facility-administered medications prior to visit.    PAST MEDICAL HISTORY: Past Medical History  Diagnosis Date  . Hypertension   . H/O cardiovascular stress test 02/2014    seen by Dr. Einar Gip - told that she was cleared for surgery  . Heart murmur   . Sleep  apnea     CPAP in use- QO night, study done at [facility]- 2007   . Arthritis     Rheumatoid, followed by Dr. Estanislado Pandy, knees & ankles   . Anemia   . Shortness of breath dyspnea     with exertion  . CHF (congestive heart failure) ([place])     PAST SURGICAL HISTORY: Past Surgical History  Procedure Laterality Date  . Abdominal hysterectomy    . Hand surgery    . Knee surgery  1990's   . Foot surgery Bilateral     bunionectomy, calluses , also has several pins & screws  .  Carpal tunnel release Right   . Tubal ligation    . Joint replacement      planned for 03/25/2014  . Total knee arthroplasty Left 03/25/2014    Procedure: TOTAL KNEE ARTHROPLASTY;  Surgeon: Garald Balding, MD;  Location: [place];  Service: Orthopedics;  Laterality: Left;  . Lumbar laminectomy/decompression microdiscectomy N/A 01/17/2015    Procedure: L2-3, L3-4 Decompression;  Surgeon: [name], MD;  Location: [place];  Service: Orthopedics;  Laterality: N/A;  . Lumbar laminectomy/decompression microdiscectomy N/A 02/06/2015    Procedure: I AND D LUMBAR WITH WOUND VAC PLACEMENT;  Surgeon: [name], MD;  Location: [place];  Service: Orthopedics;  Laterality: N/A;    FAMILY HISTORY: Family History  Problem Relation Age of Onset  . Seizures Mother   . Transient ischemic attack Mother   . Heart disease Father     CHF  . Stroke Sister     SOCIAL HISTORY: Social History   Social History  . Marital Status: Married    Spouse Name: N/A  . Number of Children: N/A  . Years of Education: N/A   Occupational History  . Not on file.   Social History Main Topics  . Smoking status: Former Smoker    Quit date: 03/16/1980  . Smokeless tobacco: Never Used  . Alcohol Use: No  . Drug Use: No  . Sexual Activity: Not on file   Other Topics Concern  . Not on file   Social History Narrative   Lives with husband.  2 Sons.  Using walker.    Caffeine  Less then 1 cups daily.   Retired.       PHYSICAL EXAM  Filed Vitals:   04/26/15 1250  BP: 130/73  Pulse: 78  Height: 5' (1.524 m)  Weight: 222 lb (100.699 kg)   Body mass index is 43.36 kg/(m^2).  Generalized: Well developed, Obese female in no acute distress , well-groomed Head: normocephalic and atraumatic,. Oropharynx benign  Neck: Supple, no carotid bruits  Cardiac: Regular rate rhythm, no murmur  Musculoskeletal: Arthritic changes in the hands   Neurological examination   Mentation: Alert oriented to time, place, history  taking. Attention span and concentration appropriate. Recent and remote memory intact.  Follows all commands speech and language fluent.   Cranial nerve II-XII: Fundoscopic exam reveals sharp disc margins.Pupils were equal round reactive to light extraocular movements were full, visual field were full on confrontational test. Facial sensation and strength were normal. hearing was intact to finger rubbing bilaterally. Uvula tongue midline. head turning and shoulder shrug were normal and symmetric.Tongue protrusion into cheek strength was normal. Motor: normal bulk and tone, full strength in the BUE, BLE, fine finger movements normal, no pronator drift. No focal weakness Sensory: normal and symmetric to light touch, and pinprick in the face arms and legs Coordination: finger-nose-finger, heel-to-shin bilaterally,  no dysmetria Reflexes: Brachioradialis 2/2, biceps 2/2, triceps 2/2, patellar 2/2, Achilles 2/2, plantar responses were flexor bilaterally. Gait and Station: Rising up from seated position without assistance, wide based stance,  moderate stride,  smooth turning, ambulates with a walker DIAGNOSTIC DATA (LABS, IMAGING, TESTING) - I reviewed patient records, labs, notes, testing and imaging myself where available.  Lab Results  Component Value Date   WBC 9.8 03/19/2015   HGB 11.3* 03/19/2015   HCT 35.7* 03/19/2015   MCV 90.8 03/19/2015   PLT 330 03/19/2015      Component Value Date/Time   NA 140 03/19/2015 1104   K 4.3 03/19/2015 1104   CL 103 03/19/2015 1104   CO2 29 03/19/2015 1104   GLUCOSE 83 03/19/2015 1104   BUN 22* 03/19/2015 1104   CREATININE 0.97 03/19/2015 1104   CREATININE 1.00* 03/08/2015 0929   CALCIUM 9.6 03/19/2015 1104   PROT 7.3 03/19/2015 1104   ALBUMIN 3.4* 03/19/2015 1104   AST 24 03/19/2015 1104   ALT 16 03/19/2015 1104   ALKPHOS 97 03/19/2015 1104   BILITOT 0.3 03/19/2015 1104   GFRNONAA 58* 03/19/2015 1104   GFRAA >60 03/19/2015 1104   Lab Results    Component Value Date   CHOL 166 03/20/2015   HDL 66 03/20/2015   LDLCALC 89 03/20/2015   TRIG 55 03/20/2015   CHOLHDL 2.5 03/20/2015   Lab Results  Component Value Date   HGBA1C 5.6 03/20/2015   Lab Results  Component Value Date   VITAMINB12 408 03/20/2015    ASSESSMENT AND PLAN  70 y.o. year old female  has a past medical history of Hypertension;  Sleep apnea; Arthritis; Anemia; Shortness of breath dyspnea; and CHF (congestive heart failure) ([place]). Recent admission for TIA, history of hyperlipidemia, morbid obesity here to follow-up from hospital admission for  TIA.MRI and MRA  of the brain with nothing acute.  CTA of the head no large vessel occlusion mild stenosis M2 origin. 2-D echo with EF 31% grade 1 diastolic heart failure with mild hypertrophy. LDL 89 and started on Lipitor 10 mg daily. No anti-thrombotic prior to admission now on aspirin 325 daily.  PLAN: Control of stroke risk factors Continue aspirin every day for secondary stroke prevention Continue Lipitor for cholesterol, labs will be followed by primary care LDL on admission 89 goal would be less than 70 Keep the blood pressure systolic less than 594  todays reading 130/73 Continue CPAP at home for obstructive sleep apnea Use walker at all times for safe ambulation Questions answered for patient and husband Follow-up in 3 monthsVst time 25 min Dennie Bible, [facility], [facility], [facility] [address], [place], [place] [address] [phone]

## 2015-04-27 ENCOUNTER — Telehealth: Payer: Self-pay | Admitting: *Deleted

## 2015-04-27 NOTE — Telephone Encounter (Signed)
Let's see her back in clinic sooner than later. Can reschedule for first available.

## 2015-04-27 NOTE — Telephone Encounter (Signed)
CRP and Sed Rate remain elevated per the pt after looking on MyChart.  She shared that she finished the oral antibiotic after her last office visit, 04/04/15.  Dr. Baxter Flattery, please advise what the pt needs to do next.  Her follow-up appt is June 14, 2015.

## 2015-04-29 NOTE — Telephone Encounter (Signed)
Per Dr. Baxter Flattery she will see her on 05/12/15 at 10:00 AM. Patient aware Myrtis Hopping

## 2015-05-12 ENCOUNTER — Ambulatory Visit (INDEPENDENT_AMBULATORY_CARE_PROVIDER_SITE_OTHER): Payer: Medicare Other | Admitting: Internal Medicine

## 2015-05-12 ENCOUNTER — Encounter: Payer: Self-pay | Admitting: Internal Medicine

## 2015-05-12 VITALS — BP 132/75 | HR 79 | Temp 98.1°F | Ht 61.0 in | Wt 216.0 lb

## 2015-05-12 DIAGNOSIS — IMO0001 Reserved for inherently not codable concepts without codable children: Secondary | ICD-10-CM

## 2015-05-12 DIAGNOSIS — M4626 Osteomyelitis of vertebra, lumbar region: Secondary | ICD-10-CM

## 2015-05-12 DIAGNOSIS — T814XXS Infection following a procedure, sequela: Secondary | ICD-10-CM | POA: Diagnosis not present

## 2015-05-12 DIAGNOSIS — M869 Osteomyelitis, unspecified: Secondary | ICD-10-CM | POA: Diagnosis not present

## 2015-05-12 LAB — CBC WITH DIFFERENTIAL/PLATELET
Basophils Absolute: 81 cells/uL (ref 0–200)
Basophils Relative: 1 %
Eosinophils Absolute: 162 cells/uL (ref 15–500)
Eosinophils Relative: 2 %
HCT: 34.6 % — ABNORMAL LOW (ref 35.0–45.0)
Hemoglobin: 11.2 g/dL — ABNORMAL LOW (ref 11.7–15.5)
Lymphocytes Relative: 26 %
Lymphs Abs: 2106 cells/uL (ref 850–3900)
MCH: 28.1 pg (ref 27.0–33.0)
MCHC: 32.4 g/dL (ref 32.0–36.0)
MCV: 86.7 fL (ref 80.0–100.0)
MPV: 10.4 fL (ref 7.5–12.5)
Monocytes Absolute: 486 cells/uL (ref 200–950)
Monocytes Relative: 6 %
Neutro Abs: 5265 cells/uL (ref 1500–7800)
Neutrophils Relative %: 65 %
Platelets: 302 10*3/uL (ref 140–400)
RBC: 3.99 MIL/uL (ref 3.80–5.10)
RDW: 12.9 % (ref 11.0–15.0)
WBC: 8.1 10*3/uL (ref 3.8–10.8)

## 2015-05-12 LAB — C-REACTIVE PROTEIN: CRP: 6 mg/dL — ABNORMAL HIGH (ref <0.60)

## 2015-05-12 LAB — BASIC METABOLIC PANEL
BUN: 19 mg/dL (ref 7–25)
CO2: 26 mmol/L (ref 20–31)
Calcium: 9.4 mg/dL (ref 8.6–10.4)
Chloride: 103 mmol/L (ref 98–110)
Creat: 1.03 mg/dL — ABNORMAL HIGH (ref 0.50–0.99)
Glucose, Bld: 85 mg/dL (ref 65–99)
Potassium: 3.9 mmol/L (ref 3.5–5.3)
Sodium: 140 mmol/L (ref 135–146)

## 2015-05-12 MED ORDER — AMOXICILLIN-POT CLAVULANATE 875-125 MG PO TABS: 1.0000 | ORAL_TABLET | Freq: Two times a day (BID) | ORAL | Status: DC

## 2015-05-13 LAB — SEDIMENTATION RATE: Sed Rate: 47 mm/hr — ABNORMAL HIGH (ref 0–30)

## 2015-05-22 NOTE — Progress Notes (Signed)
RFV: follow up on post operative incisional infection Subjective:    Patient ID: [name], female    DOB: [birth year], 70 y.o.   MRN: [id number]  HPI 70 year old female with history of RA on humira, currently on hold who had previous lumbar 2 level decompression on January 17, 2015. Postoperatively, she had some problems with serous drainage from her surgical incision as well as skin irritation from tape. the drainage from the wound progressed, at one point had noticeable foul odor with associated skin necrosis for 2-3 cm and the wound dehisced open 5-6 mm with visual Vicryl sutures present and edge skin necrosis. She underwent I x D of superficial wound infection on jan 29th by Dr. Lorin Mercy. OR cultures were polymicrobial. She was initially on amp/sub and subsequently discharged on amox/clav for which she took up until 4/10. She continues to have improvement with healing of back incision, now nearly completely healed. No drainage. She is here for follow up off of abtx.  Her inflammatory markers have been elevated but also has had some arthritis she notices.  No Known Allergies Current Outpatient Prescriptions on File Prior to Visit  Medication Sig Dispense Refill  . aspirin 325 MG tablet Take 1 tablet (325 mg total) by mouth daily.    . ferrous sulfate 325 (65 FE) MG EC tablet Take 325 mg by mouth daily with breakfast.    . hydrochlorothiazide (HYDRODIURIL) 25 MG tablet Take 25 mg by mouth daily.    . methocarbamol (ROBAXIN) 500 MG tablet Take 1 tablet (500 mg total) by mouth every 6 (six) hours as needed for muscle spasms. 60 tablet 0  . Multiple Vitamin (MULTIVITAMIN WITH MINERALS) TABS tablet Take 1 tablet by mouth daily.    . polyethylene glycol (MIRALAX / GLYCOLAX) packet Take 17 g by mouth daily. 30 each 1  . ramipril (ALTACE) 2.5 MG capsule Take 2.5 mg by mouth daily.    . traMADol (ULTRAM) 50 MG tablet Take 1 tablet by mouth every 6 (six) hours as needed for moderate pain.     Marland Kitchen  atorvastatin (LIPITOR) 10 MG tablet Take 1 tablet (10 mg total) by mouth daily at 6 PM. (Patient not taking: Reported on 05/12/2015) 30 tablet 0  . HUMIRA PEN 40 MG/0.8ML PNKT Inject 40 mg as directed every 14 (fourteen) days. Reported on 05/12/2015     No current facility-administered medications on file prior to visit.   Active Ambulatory Problems    Diagnosis Date Noted  . HLD (hyperlipidemia) 08/14/2006  . Morbid obesity ([place]) 12/13/2005  . ANEMIA, NORMOCYTIC 04/13/2008  . CARPAL TUNNEL SYNDROME 12/13/2005  . PERIPHERAL NEUROPATHY 12/13/2005  . Essential hypertension 12/13/2005  . DYSRHYTHMIA, CARDIAC NOS 12/13/2005  . CONSTIPATION 05/25/2008  . DERMATITIS 09/28/2008  . ANKLE PAIN, BILATERAL 11/25/2007  . [place] DISEASE 08/15/2006  . DYSPNEA ON EXERTION 05/25/2008  . Primary osteoarthritis of left knee 03/25/2014  . Rheumatoid arthritis of knee ([place]) 03/25/2014  . 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 History of lumbar laminectomy for spinal cord decompression 01/17/2015  . Lumbar spinal stenosis 01/17/2015  . Post op infection 02/05/2015  . 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 Lip numbness 03/19/2015  . TIA (transient ischemic attack) 03/19/2015   Resolved Ambulatory Problems    Diagnosis Date Noted  . No Resolved Ambulatory Problems   Past Medical History  Diagnosis Date  . Hypertension   . H/O cardiovascular stress test 02/2014  . Heart murmur   .  Sleep apnea   . Arthritis   . Anemia   . Shortness of breath dyspnea   . CHF (congestive heart failure) (HCC)      Review of Systems     Objective:   Physical Exam BP 132/75 mmHg  Pulse 79  Temp(Src) 98.1 F (36.7 C) (Oral)  Ht 5\' 1"  (1.549 m)  Wt 216 lb (97.977 kg)  BMI 40.83 kg/m2 Physical Exam  Constitutional:  oriented to person, place, and time. appears well-developed and well-nourished. No distress.  HENT: [place]/AT, PERRLA, no scleral icterus Mouth/Throat: Oropharynx is  clear and moist. No oropharyngeal exudate.  Cardiovascular: Normal rate, regular rhythm and normal heart sounds. Exam reveals no gallop and no friction rub.  No murmur heard.  Pulmonary/Chest: Effort normal and breath sounds normal. No respiratory distress.  has no wheezes.  Skin: Skin is warm and dry. Scarring to back from surgeries but no open wounds Psychiatric: a normal mood and affect.  behavior is normal.    Lab Results  Component Value Date   ESRSEDRATE 47* 05/12/2015   Lab Results  Component Value Date   CRP 6.0* 05/12/2015   * down from prior labs from April visit, per epic labs     Assessment & Plan:  Deep incision wound following poor wound healing from lumbar surgery = still somewhat concerned that her inflammatory markers are not as improved as would expect the labs from today. Will plan on continue with 1 addn month of abtx with amox/clav to cover polymicrobial pathogens. Will repeat inflammatory markers at end of the course and see if back symptoms are improving. May warrant imaging if healing is not as expected.  Cc: Dr. Lorin Mercy

## 2015-06-14 ENCOUNTER — Ambulatory Visit (INDEPENDENT_AMBULATORY_CARE_PROVIDER_SITE_OTHER): Payer: Medicare Other | Admitting: Internal Medicine

## 2015-06-14 ENCOUNTER — Encounter: Payer: Self-pay | Admitting: Internal Medicine

## 2015-06-14 VITALS — BP 136/85 | HR 82 | Temp 98.6°F | Wt 209.0 lb

## 2015-06-14 DIAGNOSIS — T8189XS Other complications of procedures, not elsewhere classified, sequela: Secondary | ICD-10-CM | POA: Diagnosis not present

## 2015-06-14 LAB — C-REACTIVE PROTEIN: CRP: 4.3 mg/dL — ABNORMAL HIGH (ref <0.60)

## 2015-06-14 NOTE — Progress Notes (Signed)
Subjective:    Patient ID: [name], female    DOB: [birth year], 70 y.o.   MRN: [id number]  HPI  70 year old female with history of RA on humira, currently on hold who had previous lumbar 2 level decompression on January 17, 2015. Postoperatively, she had some problems with serous drainage from her surgical incision as well as skin irritation from tape. the drainage from the wound progressed, at one point had noticeable foul odor with associated skin necrosis for 2-3 cm and the wound dehisced open 5-6 mm with visual Vicryl sutures present and edge skin necrosis. She underwent I x D of superficial wound infection on jan 29th by Dr. Lorin Mercy. OR cultures were polymicrobial. She was initially on amp/sub and subsequently discharged on amox/clav for which she took up until 4/10. She continues to have improvement with healing of back incision, now nearly completely healed. No drainage. She is here for follow up off of abtx.  Her inflammatory markers have been elevated but also has had some arthritis she notices.  No Known Allergies Current Outpatient Prescriptions on File Prior to Visit  Medication Sig Dispense Refill  . aspirin 325 MG tablet Take 1 tablet (325 mg total) by mouth daily.    Marland Kitchen atorvastatin (LIPITOR) 10 MG tablet Take 1 tablet (10 mg total) by mouth daily at 6 PM. 30 tablet 0  . ferrous sulfate 325 (65 FE) MG EC tablet Take 325 mg by mouth daily with breakfast.    . HUMIRA PEN 40 MG/0.8ML PNKT Inject 40 mg as directed every 14 (fourteen) days. Reported on 05/12/2015    . hydrochlorothiazide (HYDRODIURIL) 25 MG tablet Take 25 mg by mouth daily.    . methocarbamol (ROBAXIN) 500 MG tablet Take 1 tablet (500 mg total) by mouth every 6 (six) hours as needed for muscle spasms. 60 tablet 0  . Multiple Vitamin (MULTIVITAMIN WITH MINERALS) TABS tablet Take 1 tablet by mouth daily.    . polyethylene glycol (MIRALAX / GLYCOLAX) packet Take 17 g by mouth daily. 30 each 1  . ramipril (ALTACE)  2.5 MG capsule Take 2.5 mg by mouth daily.    . traMADol (ULTRAM) 50 MG tablet Take 1 tablet by mouth every 6 (six) hours as needed for moderate pain.      No current facility-administered medications on file prior to visit.   Active Ambulatory Problems    Diagnosis Date Noted  . HLD (hyperlipidemia) 08/14/2006  . Morbid obesity ([place]) 12/13/2005  . ANEMIA, NORMOCYTIC 04/13/2008  . CARPAL TUNNEL SYNDROME 12/13/2005  . PERIPHERAL NEUROPATHY 12/13/2005  . Essential hypertension 12/13/2005  . DYSRHYTHMIA, CARDIAC NOS 12/13/2005  . CONSTIPATION 05/25/2008  . DERMATITIS 09/28/2008  . ANKLE PAIN, BILATERAL 11/25/2007  . [place] DISEASE 08/15/2006  . DYSPNEA ON EXERTION 05/25/2008  . Primary osteoarthritis of left knee 03/25/2014  . Rheumatoid arthritis of knee ([place]) 03/25/2014  . 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 History of lumbar laminectomy for spinal cord decompression 01/17/2015  . Lumbar spinal stenosis 01/17/2015  . Post op infection 02/05/2015  . 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 Lip numbness 03/19/2015  . TIA (transient ischemic attack) 03/19/2015   Resolved Ambulatory Problems    Diagnosis Date Noted  . No Resolved Ambulatory Problems   Past Medical History  Diagnosis Date  . Hypertension   . H/O cardiovascular stress test 02/2014  . Heart murmur   . Sleep apnea   . Arthritis   . Anemia   .  Shortness of breath dyspnea   . CHF (congestive heart failure) (H[facility])      Review of Systems     Objective:   Physical Exam BP 136/85 mmHg  Pulse 82  Temp(Src) 98.6 F (37 C) (Oral)  Wt 209 lb (94.802 kg) gen = a xo by3 in NAD HEENT = moist mucous membranes,PERRLA, eomi Back = surgical area is well healed has hypopigmentation associated with the area   Lab Results  Component Value Date   ESRSEDRATE 47* 05/12/2015   Lab Results  Component Value Date   CRP 6.0* 05/12/2015       Assessment & Plan:  Post surgical wound  infection = she has completed course of treatment will check sed rate and crp.  RA = can resume treatment for rheumatoid arthritis  Cc deveshwar to [facility] humira

## 2015-06-15 LAB — SEDIMENTATION RATE: Sed Rate: 32 mm/hr — ABNORMAL HIGH (ref 0–30)

## 2015-06-16 ENCOUNTER — Ambulatory Visit: Payer: Medicare Other | Admitting: Internal Medicine

## 2015-06-21 ENCOUNTER — Encounter: Payer: Self-pay | Admitting: Internal Medicine

## 2015-07-26 ENCOUNTER — Ambulatory Visit: Payer: Medicare Other | Admitting: Nurse Practitioner

## 2015-07-27 ENCOUNTER — Ambulatory Visit (INDEPENDENT_AMBULATORY_CARE_PROVIDER_SITE_OTHER): Payer: Medicare Other | Admitting: Nurse Practitioner

## 2015-07-27 ENCOUNTER — Encounter: Payer: Self-pay | Admitting: Nurse Practitioner

## 2015-07-27 VITALS — BP 143/75 | HR 67 | Ht 61.0 in | Wt 221.0 lb

## 2015-07-27 DIAGNOSIS — E785 Hyperlipidemia, unspecified: Secondary | ICD-10-CM | POA: Diagnosis not present

## 2015-07-27 DIAGNOSIS — G4733 Obstructive sleep apnea (adult) (pediatric): Secondary | ICD-10-CM

## 2015-07-27 DIAGNOSIS — I10 Essential (primary) hypertension: Secondary | ICD-10-CM

## 2015-07-27 DIAGNOSIS — G459 Transient cerebral ischemic attack, unspecified: Secondary | ICD-10-CM | POA: Diagnosis not present

## 2015-07-27 DIAGNOSIS — Z9989 Dependence on other enabling machines and devices: Secondary | ICD-10-CM

## 2015-07-27 NOTE — Progress Notes (Signed)
I agree with the above plan 

## 2015-07-27 NOTE — Patient Instructions (Signed)
Continue aspirin every day for secondary stroke prevention Continue Simvastatin for cholesterol, labs will be followed by primary care LDL should  be less than 70 Keep the blood pressure systolic less than [id number] todays reading 143/75 Continue CPAP at home for obstructive sleep apnea Use cane at all times for safe ambulation

## 2015-07-27 NOTE — Progress Notes (Signed)
[facility]  PATIENT: [name] DOB: November 13, 1945   REASON FOR VISIT: Follow-up for transient ischemic attack, risk factors of essential hypertension hyperlipidemia and morbid obesity HISTORY FROM: Patient    HISTORY OF PRESENT ILLNESS: [name], 70 year old female returns for follow-up .She has a history of TIA which occurred in March 2017. She has not had further stroke or TIA symptoms. She is on aspirin 325 daily and her Lipitor was changed to simvastatin by her primary care Dr. Berdine Addison. Blood pressure is mildly elevated in the office today at 143/75. He denies any falls since last seen is ambulating with a single-point cane. Also has history of obstructive sleep apnea and only uses her CPAP intermittently. She gets little exercise because she takes care of her elderly mother. She returns for reevaluation   HISTORY 04/26/15 CMCornelia S Jordan is a 70 y.o. female with a history of hypertension, rheumatoid arthritis who presented with transient right facial weakness and numbness to the hospital on 03/20/15. She states that she awoke that morning with these symptoms. She went to [facility] where facial weakness was seen and documented, and the suspicion of Bell's palsy was raised. Given that she had numbness as well, however, [facility]. MRI and MRA of the brain with nothing acute. CTA of the head no large vessel occlusion mild stenosis M2 origin. 2-D echo with EF 123456 grade 1 diastolic heart failure with mild hypertrophy. LDL 89 and started on Lipitor 10 mg daily. . No anti-thrombotic prior to admission now on aspirin 325 daily. Blood pressure within normal limits in the office today. No further stroke or TIA symptoms. Continuing to recover from wound infection. Ambulating with rolling walker and no recent falls.  REVIEW OF SYSTEMS: Full 14 system review of systems performed and notable only for those listed, all others are neg:    Constitutional: neg  Cardiovascular: Leg swelling Ear/Nose/Throat: neg  Skin: neg Eyes: neg Respiratory: neg Gastroitestinal: neg  Hematology/Lymphatic: neg  Endocrine: neg Musculoskeletal: Walking difficulty Allergy/Immunology: neg Neurological: Numbness Psychiatric: neg Sleep : neg   ALLERGIES: No Known Allergies  HOME MEDICATIONS: Outpatient Prescriptions Prior to Visit  Medication Sig Dispense Refill  . aspirin 325 MG tablet Take 1 tablet (325 mg total) by mouth daily.    . ferrous sulfate 325 (65 FE) MG EC tablet Take 325 mg by mouth daily with breakfast.    . HUMIRA PEN 40 MG/0.8ML PNKT Inject 40 mg as directed every 14 (fourteen) days. Reported on 05/12/2015    . hydrochlorothiazide (HYDRODIURIL) 25 MG tablet Take 25 mg by mouth daily.    . Multiple Vitamin (MULTIVITAMIN WITH MINERALS) TABS tablet Take 1 tablet by mouth daily.    . polyethylene glycol (MIRALAX / GLYCOLAX) packet Take 17 g by mouth daily. 30 each 1  . ramipril (ALTACE) 2.5 MG capsule Take 2.5 mg by mouth daily.    . traMADol (ULTRAM) 50 MG tablet Take 1 tablet by mouth every 6 (six) hours as needed for moderate pain.     . methocarbamol (ROBAXIN) 500 MG tablet Take 1 tablet (500 mg total) by mouth every 6 (six) hours as needed for muscle spasms. (Patient not taking: Reported on 07/27/2015) 60 tablet 0  . atorvastatin (LIPITOR) 10 MG tablet Take 1 tablet (10 mg total) by mouth daily at 6 PM. 30 tablet 0   No facility-administered medications prior to visit.    PAST MEDICAL HISTORY: Past Medical History  Diagnosis  Date  . Hypertension   . H/O cardiovascular stress test 02/2014    seen by Dr. Einar Gip - told that she was cleared for surgery  . Heart murmur   . Sleep apnea     CPAP in use- QO night, [facility] done at [facility]- 2007   . Arthritis     Rheumatoid, followed by Dr. Estanislado Pandy, knees & ankles   . Anemia   . Shortness of breath dyspnea     with exertion  . CHF (congestive heart failure) ([place])      PAST SURGICAL HISTORY: Past Surgical History  Procedure Laterality Date  . Abdominal hysterectomy    . Hand surgery    . Knee surgery  1990's   . Foot surgery Bilateral     bunionectomy, calluses , also has several pins & screws  . Carpal tunnel release Right   . Tubal ligation    . Joint replacement      planned for 03/25/2014  . Total knee arthroplasty Left 03/25/2014    Procedure: TOTAL KNEE ARTHROPLASTY;  Surgeon: Garald Balding, MD;  Location: [place];  Service: Orthopedics;  Laterality: Left;  . Lumbar laminectomy/decompression microdiscectomy N/A 01/17/2015    Procedure: L2-3, L3-4 Decompression;  Surgeon: [name], MD;  Location: [place];  Service: Orthopedics;  Laterality: N/A;  . Lumbar laminectomy/decompression microdiscectomy N/A 02/06/2015    Procedure: I AND D LUMBAR WITH WOUND VAC PLACEMENT;  Surgeon: [name], MD;  Location: [place];  Service: Orthopedics;  Laterality: N/A;    FAMILY HISTORY: Family History  Problem Relation Age of Onset  . Seizures Mother   . Transient ischemic attack Mother   . Heart disease Father     CHF  . Stroke Sister     SOCIAL HISTORY: Social History   Social History  . Marital Status: Married    Spouse Name: N/A  . Number of Children: N/A  . Years of Education: N/A   Occupational History  . Not on file.   Social History Main Topics  . Smoking status: Former Smoker    Quit date: 03/16/1980  . Smokeless tobacco: Never Used  . Alcohol Use: No  . Drug Use: No  . Sexual Activity: Not on file   Other Topics Concern  . Not on file   Social History Narrative   Lives with husband.  2 Sons.  Using walker.    Caffeine  Less then 1 cups daily.   Retired.       PHYSICAL EXAM  Filed Vitals:   07/27/15 1020  BP: 143/75  Pulse: 67  Height: 5\' 1"  (1.549 m)  Weight: 221 lb (100.245 kg)   Body mass index is 41.78 kg/(m^2). Generalized: Well developed, Obese female in no acute distress , well-groomed Head: normocephalic  and atraumatic,. Oropharynx benign  Neck: Supple, no carotid bruits  Cardiac: Regular rate rhythm, no murmur  Musculoskeletal: Arthritic changes in the hands   Neurological examination   Mentation: Alert oriented to time, place, history taking. Attention span and concentration appropriate. Recent and remote memory intact. Follows all commands speech and language fluent.   Cranial nerve II-XII: Pupils were equal round reactive to light extraocular movements were full, visual field were full on confrontational test. Facial sensation and strength were normal. hearing was intact to finger rubbing bilaterally. Uvula tongue midline. head turning and shoulder shrug were normal and symmetric.Tongue protrusion into cheek strength was normal. Motor: normal bulk and tone, full strength in the BUE, BLE,  fine finger movements normal, no pronator drift. No focal weakness Sensory: normal and symmetric to light touch, and pinprick and vibratory in the face arms and legs Coordination: finger-nose-finger, heel-to-shin bilaterally, no dysmetria Reflexes: Brachioradialis 2/2, biceps 2/2, triceps 2/2, patellar 2/2, Achilles 2/2, plantar responses were flexor bilaterally. Gait and Station: Rising up from seated position without assistance, wide based stance, moderate stride, smooth turning, ambulates with a single-point cane   DIAGNOSTIC DATA (LABS, IMAGING, TESTING) - I reviewed patient records, labs, notes, testing and imaging myself where available.  Lab Results  Component Value Date   WBC 8.1 05/12/2015   HGB 11.2* 05/12/2015   HCT 34.6* 05/12/2015   MCV 86.7 05/12/2015   PLT 302 05/12/2015      Component Value Date/Time   NA 140 05/12/2015 1157   K 3.9 05/12/2015 1157   CL 103 05/12/2015 1157   CO2 26 05/12/2015 1157   GLUCOSE 85 05/12/2015 1157   BUN 19 05/12/2015 1157   CREATININE 1.03* 05/12/2015 1157   CREATININE 0.97 03/19/2015 1104   CALCIUM 9.4 05/12/2015 1157   PROT 7.3 03/19/2015  1104   ALBUMIN 3.4* 03/19/2015 1104   AST 24 03/19/2015 1104   ALT 16 03/19/2015 1104   ALKPHOS 97 03/19/2015 1104   BILITOT 0.3 03/19/2015 1104   GFRNONAA 58* 03/19/2015 1104   GFRAA >60 03/19/2015 1104   Lab Results  Component Value Date   CHOL 166 03/20/2015   HDL 66 03/20/2015   LDLCALC 89 03/20/2015   TRIG 55 03/20/2015   CHOLHDL 2.5 03/20/2015   Lab Results  Component Value Date   HGBA1C 5.6 03/20/2015   Lab Results  Component Value Date   VITAMINB12 408 03/20/2015    ASSESSMENT AND PLAN 70 y.o. year old female has a past medical history of Hypertension; Sleep apnea; Arthritis; Anemia; Shortness of breath dyspnea; and CHF (congestive heart failure) ([place]). Recent admission for TIA,03/20/15,  history of hyperlipidemia, morbid obesity here to follow-up for TIA MRI and MRA of the brain with nothing acute. CTA of the head no large vessel occlusion mild stenosis M2 origin. 2-D echo with EF 123456 grade 1  diastolic heart failure with mild hypertrophy. LDL 89 and started on Lipitor 10 mg daily.Now switched to Simvastatin.  No anti-thrombotic prior to admission now on aspirin 325 daily.The patient is a current patient of Dr. Erlinda Hong  who is out of the office today . This note is sent to the work in doctor.     PLAN: Control of stroke risk factors Continue aspirin every day for secondary stroke prevention Continue Simvastatin for cholesterol, labs will be followed by primary care LDL should  be less than 70 Keep the blood pressure systolic less than [id number] todays reading 143/75 Continue CPAP at home for obstructive sleep apnea needs to be compliant every day Use cane at all times for safe ambulation Follow-up in 6 months Dennie Bible, [facility], [facility], [facility] [address] [place], [place] [address] [phone]

## 2015-10-11 ENCOUNTER — Ambulatory Visit: Payer: Medicare Other | Admitting: Rheumatology

## 2015-10-12 ENCOUNTER — Ambulatory Visit (INDEPENDENT_AMBULATORY_CARE_PROVIDER_SITE_OTHER): Payer: Medicare Other | Admitting: Rheumatology

## 2015-10-12 ENCOUNTER — Ambulatory Visit: Payer: Medicare Other | Admitting: Rheumatology

## 2015-10-12 DIAGNOSIS — E669 Obesity, unspecified: Secondary | ICD-10-CM | POA: Diagnosis not present

## 2015-10-12 DIAGNOSIS — M25521 Pain in right elbow: Secondary | ICD-10-CM | POA: Diagnosis not present

## 2015-10-12 DIAGNOSIS — Z09 Encounter for follow-up examination after completed treatment for conditions other than malignant neoplasm: Secondary | ICD-10-CM

## 2015-10-12 DIAGNOSIS — M0579 Rheumatoid arthritis with rheumatoid factor of multiple sites without organ or systems involvement: Secondary | ICD-10-CM

## 2015-10-12 DIAGNOSIS — M25551 Pain in right hip: Secondary | ICD-10-CM

## 2015-11-09 ENCOUNTER — Other Ambulatory Visit: Payer: Self-pay | Admitting: Rheumatology

## 2015-11-09 LAB — CBC WITH DIFFERENTIAL/PLATELET
Basophils Absolute: 60 cells/uL (ref 0–200)
Basophils Relative: 1 %
Eosinophils Absolute: 120 cells/uL (ref 15–500)
Eosinophils Relative: 2 %
HCT: 37.7 % (ref 35.0–45.0)
Hemoglobin: 12.2 g/dL (ref 11.7–15.5)
Lymphocytes Relative: 25 %
Lymphs Abs: 1500 cells/uL (ref 850–3900)
MCH: 28.4 pg (ref 27.0–33.0)
MCHC: 32.4 g/dL (ref 32.0–36.0)
MCV: 87.9 fL (ref 80.0–100.0)
MPV: 11 fL (ref 7.5–12.5)
Monocytes Absolute: 480 cells/uL (ref 200–950)
Monocytes Relative: 8 %
Neutro Abs: 3840 cells/uL (ref 1500–7800)
Neutrophils Relative %: 64 %
Platelets: 272 10*3/uL (ref 140–400)
RBC: 4.29 MIL/uL (ref 3.80–5.10)
RDW: 14 % (ref 11.0–15.0)
WBC: 6 10*3/uL (ref 3.8–10.8)

## 2015-11-09 LAB — COMPLETE METABOLIC PANEL WITH GFR
ALT: 9 U/L (ref 6–29)
AST: 16 U/L (ref 10–35)
Albumin: 3.7 g/dL (ref 3.6–5.1)
Alkaline Phosphatase: 96 U/L (ref 33–130)
BUN: 27 mg/dL — ABNORMAL HIGH (ref 7–25)
CO2: 24 mmol/L (ref 20–31)
Calcium: 9.3 mg/dL (ref 8.6–10.4)
Chloride: 107 mmol/L (ref 98–110)
Creat: 1.38 mg/dL — ABNORMAL HIGH (ref 0.50–0.99)
GFR, Est African American: 45 mL/min — ABNORMAL LOW (ref >=60)
GFR, Est Non African American: 39 mL/min — ABNORMAL LOW (ref >=60)
Glucose, Bld: 75 mg/dL (ref 65–99)
Potassium: 4.4 mmol/L (ref 3.5–5.3)
Sodium: 142 mmol/L (ref 135–146)
Total Bilirubin: 0.3 mg/dL (ref 0.2–1.2)
Total Protein: 7.2 g/dL (ref 6.1–8.1)

## 2015-11-10 NOTE — Progress Notes (Signed)
Most likely not related to Humira. She should discuss with PCP.

## 2015-11-15 ENCOUNTER — Telehealth: Payer: Self-pay | Admitting: Radiology

## 2015-11-15 NOTE — Telephone Encounter (Signed)
-----   Message from Bo Merino, MD sent at 11/10/2015 12:35 PM EDT ----- Most likely not related to Humira. She should discuss with PCP.

## 2015-11-15 NOTE — Telephone Encounter (Signed)
I have called patient to advise kidney function a bit low / drink water discuss with PCP /no answer, have sent my chart message

## 2015-11-18 ENCOUNTER — Ambulatory Visit (INDEPENDENT_AMBULATORY_CARE_PROVIDER_SITE_OTHER): Payer: Medicare Other | Admitting: Orthopaedic Surgery

## 2015-11-18 ENCOUNTER — Encounter (INDEPENDENT_AMBULATORY_CARE_PROVIDER_SITE_OTHER): Payer: Self-pay | Admitting: Orthopaedic Surgery

## 2015-11-18 ENCOUNTER — Ambulatory Visit (INDEPENDENT_AMBULATORY_CARE_PROVIDER_SITE_OTHER): Payer: Medicare Other

## 2015-11-18 VITALS — BP 140/82 | HR 80 | Ht 61.0 in | Wt 216.0 lb

## 2015-11-18 DIAGNOSIS — M48061 Spinal stenosis, lumbar region without neurogenic claudication: Secondary | ICD-10-CM

## 2015-11-18 DIAGNOSIS — M545 Low back pain: Secondary | ICD-10-CM

## 2015-11-18 DIAGNOSIS — Z9889 Other specified postprocedural states: Secondary | ICD-10-CM

## 2015-11-18 NOTE — Progress Notes (Signed)
Office Visit Note   Patient: [name]           Date of Birth: 1945/05/31           MRN: [id number] Visit Date: 11/18/2015              Requested by: Iona Beard, MD [address] [place], [place] [address] PCP: Maggie Font, MD   Assessment & Plan: Visit Diagnoses:  1. Low back pain, unspecified back pain laterality, unspecified chronicity, with sciatica presence unspecified     Plan: Patient and decompression L2-L3 4 which was the 2 levels above previous autofusion at L4-5. She is still having trouble walking with weakness in her legs and still a mature with a cane is not able to ambulate a significant distance.  Follow-Up Instructions: Return in about 1 month (around 12/18/2015).   Orders:  Orders Placed This Encounter  Procedures  . XR Lumbar Spine 2-3 Views  . C-reactive protein  . Sed Rate (ESR)   No orders of the defined types were placed in this encounter.     Procedures: No procedures performed   Clinical Data: No additional findings.   Subjective: Chief Complaint  Patient presents with  . Lower Back - Pain    Patient is having back pain that radiates into both legs, right greater than left, worsening in the last 3 weeks.  The pain radiates to her knees. She continues to have numbness in both legs and feet. She is also complaining of cramps in her legs. She is status post L2-3, L3-4 Decompression on 01-17-15. She also had excisional debridement of superficial Sub Q incision.  She is using Biofreeze, heating patch, Tramadol, and Naprosyn with not a lot of relief. She states it feels like "electrical shocks" in legs. She complains of difficulty walking.     Review of Systems  Constitutional: Negative for chills and diaphoresis.  HENT: Negative for ear discharge, ear pain and nosebleeds.   Eyes: Negative for discharge and visual disturbance.  Respiratory: Negative for cough, choking and shortness of breath.   Cardiovascular: Negative for chest  pain and palpitations.  Gastrointestinal: Negative for abdominal distention and abdominal pain.  Endocrine: Negative for cold intolerance and heat intolerance.  Genitourinary: Negative for flank pain and hematuria.  Musculoskeletal:       Positive for rheumatoid arthritis previous autofusion L4-5. Spinal stenosis status post decompression January 2017 at L2-3 and L3-4 level.  Skin: Negative for rash and wound.  Neurological: Negative for seizures and speech difficulty.  Hematological: Negative for adenopathy. Does not bruise/bleed easily.  Psychiatric/Behavioral: Negative for agitation and suicidal ideas.     Objective: Vital Signs: BP 140/82   Pulse 80   Ht 5' 1" (1.549 m)   Wt 216 lb (98 kg)   BMI 40.81 kg/m   Physical Exam  Constitutional: She is oriented to person, place, and time. She appears well-developed.  HENT:  Head: Normocephalic.  Right Ear: External ear normal.  Left Ear: External ear normal.  Eyes: Pupils are equal, round, and reactive to light.  Neck: No tracheal deviation present. No thyromegaly present.  Cardiovascular: Normal rate.   Pulmonary/Chest: Effort normal.  Abdominal: Soft.  Musculoskeletal:  Healed anterior incision left knee from total knee arthroplasty she has right knee crepitus. She has some bilateral quad weakness right and left she ambulates with a forward flexed position at her hips no pain with hip range of motion. No pitting edema no rash ever  exposed skin. She has rheumatoid the deformity with the ulnar drift at the MCP joints. Elbows, within 10 of full extension. Lumbar incision is well-healed with some scarring from previous VAC placement. Patient had lumbar decompression and then had the postoperative subcutaneous hematoma infected which required the [employer] for eventual closure. Skin is well-healed there is no cellulitis.  Neurological: She is alert and oriented to person, place, and time.  Skin: Skin is warm and dry.  Psychiatric: She has a  normal mood and affect. Her behavior is normal.    Ortho Exam the patient has third-degree ulnar drift MCP joints. Most of significant PIP degenerative changes grossly. She has the some radial deformity of the DIP joint left fifth finger and only mild of the right. Well-healed knee incision on the left from a total knee arthroplasty.  Specialty Comments:  No specialty comments available.  Imaging: Xr Lumbar Spine 2-3 Views  Result Date: 11/18/2015 AP lateral lumbar spine x-rays are obtained. This shows unchanged autofusion at L4-5 consistent with last year's films. Interval laminectomy with removal of spinous process at L3 and L4 with the decompression procedure done above. Partial L2 spinous process removal as well. No anterolisthesis and no changes that would suggest discitis. Impression: Post 2 level decompression L2-3 L3-4 without evidence of bone infection and no instability pattern. Autofusion at L4-5 unchanged    PMFS History: Patient Active Problem List   Diagnosis Date Noted  . OSA on CPAP 07/27/2015  . Lip numbness 03/19/2015  . TIA (transient ischemic attack) 03/19/2015  . 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 Post op infection 02/05/2015  . History of lumbar laminectomy for spinal cord decompression 01/17/2015  . Lumbar spinal stenosis 01/17/2015  . Primary osteoarthritis of left knee 03/25/2014  . Rheumatoid arthritis of knee ([place]) 03/25/2014  . 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 DERMATITIS 09/28/2008  . CONSTIPATION 05/25/2008  . DYSPNEA ON EXERTION 05/25/2008  . ANEMIA, NORMOCYTIC 04/13/2008  . ANKLE PAIN, BILATERAL 11/25/2007  . [place] DISEASE 08/15/2006  . HLD (hyperlipidemia) 08/14/2006  . Morbid obesity ([place]) 12/13/2005  . CARPAL TUNNEL SYNDROME 12/13/2005  . PERIPHERAL NEUROPATHY 12/13/2005  . Essential hypertension 12/13/2005  . DYSRHYTHMIA, CARDIAC NOS 12/13/2005   Past Medical History:  Diagnosis Date  . Anemia     . Arthritis    Rheumatoid, followed by Dr. Estanislado Pandy, knees & ankles   . CHF (congestive heart failure) ([place])   . H/O cardiovascular stress test 02/2014   seen by Dr. Einar Gip - told that she was cleared for surgery  . Heart murmur   . Hypertension   . Shortness of breath dyspnea    with exertion  . Sleep apnea    CPAP in use- QO night, study done at [facility]- 2007     Family History  Problem Relation Age of Onset  . Seizures Mother   . Transient ischemic attack Mother   . Heart disease Father     CHF  . Stroke Sister     Past Surgical History:  Procedure Laterality Date  . ABDOMINAL HYSTERECTOMY    . CARPAL TUNNEL RELEASE Right   . FOOT SURGERY Bilateral    bunionectomy, calluses , also has several pins & screws  . HAND SURGERY    . JOINT REPLACEMENT     planned for 03/25/2014  . KNEE SURGERY  1990's   . LUMBAR LAMINECTOMY/DECOMPRESSION MICRODISCECTOMY N/A 01/17/2015   Procedure: L2-3, L3-4 Decompression;  Surgeon: [name], MD;  Location: [place];  Service: Orthopedics;  Laterality: N/A;  . LUMBAR LAMINECTOMY/DECOMPRESSION MICRODISCECTOMY N/A 02/06/2015   Procedure: I AND D LUMBAR WITH WOUND VAC PLACEMENT;  Surgeon: [name], MD;  Location: [place];  Service: Orthopedics;  Laterality: N/A;  . TOTAL KNEE ARTHROPLASTY Left 03/25/2014   Procedure: TOTAL KNEE ARTHROPLASTY;  Surgeon: Garald Balding, MD;  Location: [place];  Service: Orthopedics;  Laterality: Left;  . TUBAL LIGATION     Social History   Occupational History  . Not on file.   Social History Main Topics  . Smoking status: Former Smoker    Quit date: 03/16/1980  . Smokeless tobacco: Never Used  . Alcohol use No  . Drug use: No  . Sexual activity: Not on file

## 2015-11-23 ENCOUNTER — Ambulatory Visit: Payer: Medicare Other | Attending: Orthopaedic Surgery | Admitting: Physical Therapy

## 2015-11-23 DIAGNOSIS — M79651 Pain in right thigh: Secondary | ICD-10-CM | POA: Diagnosis present

## 2015-11-23 DIAGNOSIS — G8929 Other chronic pain: Secondary | ICD-10-CM | POA: Insufficient documentation

## 2015-11-23 DIAGNOSIS — M79652 Pain in left thigh: Secondary | ICD-10-CM | POA: Insufficient documentation

## 2015-11-23 DIAGNOSIS — M545 Low back pain: Secondary | ICD-10-CM | POA: Insufficient documentation

## 2015-11-23 NOTE — Therapy (Signed)
[facility] [place], [place], [address] Phone: [phone]   Fax:  [phone]  Physical Therapy Evaluation  Patient Details  Name: [name] MRN: [id number] Date of Birth: 09-24-1945 Referring Provider: Rodell Perna MD.  Encounter Date: 11/23/2015      PT End of Session - 11/23/15 1106    Visit Number 1   Number of Visits 16   Date for PT Re-Evaluation 01/22/16   PT Start Time 1033   PT Stop Time 1126   PT Time Calculation (min) 53 min   Activity Tolerance Patient tolerated treatment well   Behavior During Therapy [facility] for tasks assessed/performed      Past Medical History:  Diagnosis Date  . Anemia   . Arthritis    Rheumatoid, followed by Dr. Estanislado Pandy, knees & ankles   . CHF (congestive heart failure) ([place])   . H/O cardiovascular stress test 02/2014   seen by Dr. Einar Gip - told that she was cleared for surgery  . Heart murmur   . Hypertension   . Shortness of breath dyspnea    with exertion  . Sleep apnea    CPAP in use- QO night, study done at [facility]- 2007     Past Surgical History:  Procedure Laterality Date  . ABDOMINAL HYSTERECTOMY    . CARPAL TUNNEL RELEASE Right   . FOOT SURGERY Bilateral    bunionectomy, calluses , also has several pins & screws  . HAND SURGERY    . JOINT REPLACEMENT     planned for 03/25/2014  . KNEE SURGERY  1990's   . LUMBAR LAMINECTOMY/DECOMPRESSION MICRODISCECTOMY N/A 01/17/2015   Procedure: L2-3, L3-4 Decompression;  Surgeon: [name], MD;  Location: [place];  Service: Orthopedics;  Laterality: N/A;  . LUMBAR LAMINECTOMY/DECOMPRESSION MICRODISCECTOMY N/A 02/06/2015   Procedure: I AND D LUMBAR WITH WOUND VAC PLACEMENT;  Surgeon: [name], MD;  Location: [place];  Service: Orthopedics;  Laterality: N/A;  . TOTAL KNEE ARTHROPLASTY Left 03/25/2014   Procedure: TOTAL KNEE ARTHROPLASTY;  Surgeon: Garald Balding, MD;  Location: [place];  Service: Orthopedics;  Laterality: Left;   . TUBAL LIGATION      There were no vitals filed for this visit.       Subjective Assessment - 11/23/15 1207    Subjective The patient presents with ongoing low back since a spinal decompression (L2-3 and L3-4) on 01/17/15.  She developed an infection and had another hospital admission (also in january of 2017).  The patient states that her pain increases to nearly a 10/10 after being up a lot.  pain radiates into bilateral anterior thigh (right > left) which includes numbess and tingling.  Resting helps to decrease her pain.              [facility] PT Assessment - 11/23/15 0001      Assessment   Medical Diagnosis S/p lumbar decompression at L2-3; L3-4.   Referring Provider Rodell Perna MD.   Onset Date/Surgical Date 01/17/15     Precautions   Precautions None     Restrictions   Weight Bearing Restrictions No     Balance Screen   Has the patient fallen in the past 6 months Yes   How many times? --  1.   Has the patient had a decrease in activity level because of a fear of falling?  Yes   Is the patient reluctant to leave their home because of a fear of falling?  Yes     [place] residence     Prior Function   Level of Independence Independent     Posture/Postural Control   Posture/Postural Control Postural limitations   Postural Limitations Rounded Shoulders;Forward head;Increased lumbar lordosis;Flexed trunk;Weight shift left     ROM / Strength   AROM / PROM / Strength AROM;Strength     AROM   Overall AROM Comments WFL for bilateral LE's.     Strength   Overall Strength Comments Bilateral hip strength= 4/5; bilateral knee and ankle strength= 4+/5.     Palpation   Palpation comment Tender to palpation over bilateral lumbar incisional site region but the patient CC is that of tingling and numbness over bilateral quads (RTR > LT).     Ambulation/Gait   Gait Pattern Decreased step length - right;Decreased step length -  left;Decreased stride length;Lateral trunk lean to left;Trunk flexed   Gait Comments Ambulation with a straight cane.                   [facility] Adult PT Treatment/Exercise - 11/23/15 0001      Modalities   Modalities Electrical Stimulation;Moist Heat     Moist Heat Therapy   Number Minutes Moist Heat 20 Minutes   Moist Heat Location Lumbar Spine     Electrical Stimulation   Electrical Stimulation Location Low back.   Electrical Stimulation Action IFC   Electrical Stimulation Parameters 80-150 Hz x 20 minutes.   Electrical Stimulation Goals Pain                  PT Short Term Goals - 11/23/15 1204      PT SHORT TERM GOAL #1   Title Ind with initial HEP.   Time 2   Period Weeks   Status New           PT Long Term Goals - 11/23/15 1205      PT LONG TERM GOAL #1   Title Ind with advanced HEP.   Time 8   Period Weeks   Status New     PT LONG TERM GOAL #2   Title Stand 20 minutes with pain not > 3-4/10.   Time 8   Period Weeks   Status New     PT LONG TERM GOAL #3   Title Walk a community distance with pain not > 3-4/10.   Time 8   Period Weeks   Status New     PT LONG TERM GOAL #4   Title Patient to subjectively report a 50% decrease in bilateral LE symptoms.   Time 8   Period Weeks   Status New     PT LONG TERM GOAL #5   Title Perform ADL's with pain not > 3-4/10.   Time 8   Period Weeks   Status New               Plan - 11/23/15 1146    Clinical Impression Statement The patient presents to OPPT with c/o low back pain and at times intense (10/10) bilateral thigh pain right > left.  The is an evolving problem with regards to increasing bilateral LE pain.  This has affected her ability to perform ADL's.     Rehab Potential Good   PT Frequency 2x / week   PT Duration 8 weeks   PT Treatment/Interventions ADLs/Self Care Home Management;Electrical Stimulation;Cryotherapy;Moist Heat;Ultrasound;Therapeutic activities;Therapeutic  exercise;Patient/family education;Manual techniques   PT Next Visit Plan STW/M  to low back region; Nustep; bilateral LE strengthening; gait and balance activites.  Modalites to low back region.   Consulted and Agree with Plan of Care Patient      Patient will benefit from skilled therapeutic intervention in order to improve the following deficits and impairments:  Pain, Decreased activity tolerance, Decreased strength, Postural dysfunction, Abnormal gait  Visit Diagnosis: Pain in left thigh - Plan: PT plan of care cert/re-cert  Pain in right thigh - Plan: PT plan of care cert/re-cert  Chronic bilateral low back pain, with sciatica presence unspecified - Plan: PT plan of care cert/re-cert      G-Codes - [id number]    Functional Assessment Tool Used Clinical judgement....   Functional Limitation Mobility: Walking and moving around   Mobility: Walking and Moving Around Current Status [phone]) At least 40 percent but less than 60 percent impaired, limited or restricted   Mobility: Walking and Moving Around Goal Status [phone]) At least 20 percent but less than 40 percent impaired, limited or restricted       Problem List Patient Active Problem List   Diagnosis Date Noted  . OSA on CPAP 07/27/2015  . Lip numbness 03/19/2015  . TIA (transient ischemic attack) 03/19/2015  . 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 Post op infection 02/05/2015  . History of lumbar laminectomy for spinal cord decompression 01/17/2015  . Lumbar spinal stenosis 01/17/2015  . Primary osteoarthritis of left knee 03/25/2014  . Rheumatoid arthritis of knee ([place]) 03/25/2014  . 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 DERMATITIS 09/28/2008  . CONSTIPATION 05/25/2008  . DYSPNEA ON EXERTION 05/25/2008  . ANEMIA, NORMOCYTIC 04/13/2008  . ANKLE PAIN, BILATERAL 11/25/2007  . [place] DISEASE 08/15/2006  . HLD (hyperlipidemia) 08/14/2006  . Morbid obesity ([place]) 12/13/2005  . CARPAL  TUNNEL SYNDROME 12/13/2005  . PERIPHERAL NEUROPATHY 12/13/2005  . Essential hypertension 12/13/2005  . DYSRHYTHMIA, CARDIAC NOS 12/13/2005    Keyri Salberg, [place] MPT 11/23/2015, 12:09 PM  [facility] [address] [place], [place], [address] Phone: [phone]   Fax:  [phone]  Name: [name] MRN: [id number] Date of Birth: 1945/07/01

## 2015-11-25 ENCOUNTER — Ambulatory Visit: Payer: Medicare Other | Admitting: *Deleted

## 2015-11-25 DIAGNOSIS — M79652 Pain in left thigh: Secondary | ICD-10-CM | POA: Diagnosis not present

## 2015-11-25 DIAGNOSIS — M79651 Pain in right thigh: Secondary | ICD-10-CM

## 2015-11-25 DIAGNOSIS — G8929 Other chronic pain: Secondary | ICD-10-CM

## 2015-11-25 DIAGNOSIS — M545 Low back pain: Secondary | ICD-10-CM

## 2015-11-25 NOTE — Patient Instructions (Signed)

## 2015-11-25 NOTE — Therapy (Signed)
[facility] [place], [place], [address] Phone: [phone]   Fax:  [phone]  Physical Therapy Treatment  Patient Details  Name: [name] MRN: [id number] Date of Birth: [birth year] Referring Provider: Rodell Perna MD.  Encounter Date: 11/25/2015      PT End of Session - 11/25/15 1036    Visit Number 2   Number of Visits 16   Date for PT Re-Evaluation 01/22/16   PT Start Time 1030   PT Stop Time 1121   PT Time Calculation (min) 51 min      Past Medical History:  Diagnosis Date  . Anemia   . Arthritis    Rheumatoid, followed by Dr. Estanislado Pandy, knees & ankles   . CHF (congestive heart failure) ([place])   . H/O cardiovascular stress test 02/2014   seen by Dr. Einar Gip - told that she was cleared for surgery  . Heart murmur   . Hypertension   . Shortness of breath dyspnea    with exertion  . Sleep apnea    CPAP in use- QO night, study done at [facility]- 2007     Past Surgical History:  Procedure Laterality Date  . ABDOMINAL HYSTERECTOMY    . CARPAL TUNNEL RELEASE Right   . FOOT SURGERY Bilateral    bunionectomy, calluses , also has several pins & screws  . HAND SURGERY    . JOINT REPLACEMENT     planned for 03/25/2014  . KNEE SURGERY  1990's   . LUMBAR LAMINECTOMY/DECOMPRESSION MICRODISCECTOMY N/A 01/17/2015   Procedure: L2-3, L3-4 Decompression;  Surgeon: [name], MD;  Location: [place];  Service: Orthopedics;  Laterality: N/A;  . LUMBAR LAMINECTOMY/DECOMPRESSION MICRODISCECTOMY N/A 02/06/2015   Procedure: I AND D LUMBAR WITH WOUND VAC PLACEMENT;  Surgeon: [name], MD;  Location: [place];  Service: Orthopedics;  Laterality: N/A;  . TOTAL KNEE ARTHROPLASTY Left 03/25/2014   Procedure: TOTAL KNEE ARTHROPLASTY;  Surgeon: Garald Balding, MD;  Location: [place];  Service: Orthopedics;  Laterality: Left;  . TUBAL LIGATION      There were no vitals filed for this visit.      Subjective Assessment - 11/25/15  1042    Subjective The patient presents with ongoing low back since a spinal decompression (L2-3 and L3-4) on 01/17/15.  She developed an infection and had another hospital admission (also in january of 2017).  The patient states that her pain increases to nearly a 10/10 after being up a lot.  pain radiates into bilateral anterior thigh (right > left) which includes numbess and tingling.  Resting helps to decrease her pain.     Limitations Walking   How long can you walk comfortably? Short distances.   Currently in Pain? Yes   Pain Score 5    Pain Location Leg   Pain Orientation Left;Right;Anterior   Pain Type Chronic pain   Pain Onset More than a month ago   Pain Frequency Constant                         [facility] Adult PT Treatment/Exercise - 11/25/15 0001      Exercises   Exercises Lumbar;Knee/Hip     Lumbar Exercises: Aerobic   Stationary Bike Nustep x 15 mins L4 seat 8     Lumbar Exercises: Supine   Ab Set 20 reps  Drawins   Bent Knee Raise 20 reps;3 seconds  marching   Bridge 3 seconds;5 reps  challenging for Pt      Modalities   Modalities Electrical Stimulation;Moist Heat     Moist Heat Therapy   Number Minutes Moist Heat 20 Minutes   Moist Heat Location Lumbar Spine     Electrical Stimulation   Electrical Stimulation Location Low back.  IFC 80-150hz  x 15 mins   Electrical Stimulation Goals Pain                                                                               Heat and estim in sitting            PT Education - 11/25/15 1121    Education provided Yes   Education Details core activation exs   Person(s) Educated Patient   Methods Explanation;Demonstration;Tactile cues;Verbal cues;Handout   Comprehension Verbalized understanding;Returned demonstration          PT Short Term Goals - 11/23/15 1204      PT SHORT TERM GOAL #1   Title Ind with initial HEP.   Time 2   Period Weeks   Status New           PT Long Term  Goals - 11/23/15 1205      PT LONG TERM GOAL #1   Title Ind with advanced HEP.   Time 8   Period Weeks   Status New     PT LONG TERM GOAL #2   Title Stand 20 minutes with pain not > 3-4/10.   Time 8   Period Weeks   Status New     PT LONG TERM GOAL #3   Title Walk a community distance with pain not > 3-4/10.   Time 8   Period Weeks   Status New     PT LONG TERM GOAL #4   Title Patient to subjectively report a 50% decrease in bilateral LE symptoms.   Time 8   Period Weeks   Status New     PT LONG TERM GOAL #5   Title Perform ADL's with pain not > 3-4/10.   Time 8   Period Weeks   Status New               Plan - 11/25/15 1037    Clinical Impression Statement Pt did fairly well today and was able to perform therex with only minimal pain increase. Core activation exs were practiced and verbal/ tactile cues were needed. We started with therex in supine, but Pt needed to sit up to finish Rx sitting in a chair. HEP  was given for chore activation exs and was discussed with Pt to perform in sitting as well.   Rehab Potential Good   PT Frequency 2x / week   PT Duration 8 weeks   PT Treatment/Interventions ADLs/Self Care Home Management;Electrical Stimulation;Cryotherapy;Moist Heat;Ultrasound;Therapeutic activities;Therapeutic exercise;Patient/family education;Manual techniques   PT Next Visit Plan STW/M to low back region; Nustep; bilateral LE strengthening; gait and balance activites.  Modalites to low back region.   Consulted and Agree with Plan of Care Patient      Patient will benefit from skilled therapeutic intervention in order to improve the following deficits and impairments:  Pain, Decreased activity tolerance, Decreased strength, Postural dysfunction,  Abnormal gait  Visit Diagnosis: Pain in left thigh  Pain in right thigh  Chronic bilateral low back pain, with sciatica presence unspecified     Problem List Patient Active Problem List   Diagnosis  Date Noted  . OSA on CPAP 07/27/2015  . Lip numbness 03/19/2015  . TIA (transient ischemic attack) 03/19/2015  . 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 Post op infection 02/05/2015  . History of lumbar laminectomy for spinal cord decompression 01/17/2015  . Lumbar spinal stenosis 01/17/2015  . Primary osteoarthritis of left knee 03/25/2014  . Rheumatoid arthritis of knee ([place]) 03/25/2014  . 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 DERMATITIS 09/28/2008  . CONSTIPATION 05/25/2008  . DYSPNEA ON EXERTION 05/25/2008  . ANEMIA, NORMOCYTIC 04/13/2008  . ANKLE PAIN, BILATERAL 11/25/2007  . [place] DISEASE 08/15/2006  . HLD (hyperlipidemia) 08/14/2006  . Morbid obesity ([place]) 12/13/2005  . CARPAL TUNNEL SYNDROME 12/13/2005  . PERIPHERAL NEUROPATHY 12/13/2005  . Essential hypertension 12/13/2005  . DYSRHYTHMIA, CARDIAC NOS 12/13/2005    Scotlyn Mccranie,CHRIS, PTA 11/25/2015, 11:29 AM  [facility] [place], [place], [address] Phone: [phone]   Fax:  [phone]  Name: [name] MRN: [id number] Date of Birth: Nov 12, 1945

## 2015-11-28 ENCOUNTER — Telehealth (INDEPENDENT_AMBULATORY_CARE_PROVIDER_SITE_OTHER): Payer: Self-pay | Admitting: Rheumatology

## 2015-11-28 ENCOUNTER — Other Ambulatory Visit: Payer: Self-pay | Admitting: Radiology

## 2015-11-28 MED ORDER — HUMIRA PEN 40 MG/0.8ML ~~LOC~~ PNKT: 40.0000 mg | PEN_INJECTOR | SUBCUTANEOUS | 2 refills | Status: DC

## 2015-11-28 NOTE — Telephone Encounter (Signed)
Labs 11/2015  Last visit 10/12/15 Next visit not sch message sent for appt sch TB gold neg 07/01/15 Ok to refill Humira per Mr Carlyon Shadow

## 2015-11-28 NOTE — Telephone Encounter (Signed)
Pt needing refill of humera. Please call pt if you have any questions.

## 2015-11-29 ENCOUNTER — Ambulatory Visit: Payer: Medicare Other | Admitting: Physical Therapy

## 2015-11-29 DIAGNOSIS — M545 Low back pain: Secondary | ICD-10-CM

## 2015-11-29 DIAGNOSIS — M79651 Pain in right thigh: Secondary | ICD-10-CM

## 2015-11-29 DIAGNOSIS — M79652 Pain in left thigh: Secondary | ICD-10-CM | POA: Diagnosis not present

## 2015-11-29 DIAGNOSIS — G8929 Other chronic pain: Secondary | ICD-10-CM

## 2015-11-29 NOTE — Therapy (Signed)
[facility] [place], [place], [address] Phone: [phone]   Fax:  [phone]  Physical Therapy Treatment  Patient Details  Name: [name] MRN: [id number] Date of Birth: 1945-07-29 Referring Provider: Rodell Perna MD.  Encounter Date: 11/29/2015      PT End of Session - 11/29/15 1153    Visit Number 3   Number of Visits 16   Date for PT Re-Evaluation 01/22/16   PT Start Time 1030   PT Stop Time 1114   PT Time Calculation (min) 44 min   Activity Tolerance Patient tolerated treatment well   Behavior During Therapy [facility] for tasks assessed/performed      Past Medical History:  Diagnosis Date  . Anemia   . Arthritis    Rheumatoid, followed by Dr. Estanislado Pandy, knees & ankles   . CHF (congestive heart failure) ([place])   . H/O cardiovascular stress test 02/2014   seen by Dr. Einar Gip - told that she was cleared for surgery  . Heart murmur   . Hypertension   . Shortness of breath dyspnea    with exertion  . Sleep apnea    CPAP in use- QO night, study done at [facility]- 2007     Past Surgical History:  Procedure Laterality Date  . ABDOMINAL HYSTERECTOMY    . CARPAL TUNNEL RELEASE Right   . FOOT SURGERY Bilateral    bunionectomy, calluses , also has several pins & screws  . HAND SURGERY    . JOINT REPLACEMENT     planned for 03/25/2014  . KNEE SURGERY  1990's   . LUMBAR LAMINECTOMY/DECOMPRESSION MICRODISCECTOMY N/A 01/17/2015   Procedure: L2-3, L3-4 Decompression;  Surgeon: [name], MD;  Location: [place];  Service: Orthopedics;  Laterality: N/A;  . LUMBAR LAMINECTOMY/DECOMPRESSION MICRODISCECTOMY N/A 02/06/2015   Procedure: I AND D LUMBAR WITH WOUND VAC PLACEMENT;  Surgeon: [name], MD;  Location: [place];  Service: Orthopedics;  Laterality: N/A;  . TOTAL KNEE ARTHROPLASTY Left 03/25/2014   Procedure: TOTAL KNEE ARTHROPLASTY;  Surgeon: Garald Balding, MD;  Location: [place];  Service: Orthopedics;  Laterality: Left;   . TUBAL LIGATION      There were no vitals filed for this visit.      Subjective Assessment - 11/29/15 1154    Subjective My back feels tight today.   Pain Score 5    Pain Location Back   Pain Orientation Right;Left   Pain Descriptors / Indicators Tightness   Pain Type Chronic pain   Pain Onset More than a month ago                         [facility] Adult PT Treatment/Exercise - 11/29/15 0001      Exercises   Exercises Knee/Hip     Lumbar Exercises: Aerobic   Stationary Bike Nustep level 4 x 20 minutes.     Manual Therapy   Manual therapy comments Left sdly position with pillows between knees for comfort:  STW/M x 18 minutes to patient bilateral lower lumbar musculature.                  PT Short Term Goals - 11/23/15 1204      PT SHORT TERM GOAL #1   Title Ind with initial HEP.   Time 2   Period Weeks   Status New           PT Long Term Goals - 11/23/15  [place] #1   Title Ind with advanced HEP.   Time 8   Period Weeks   Status New     PT LONG TERM GOAL #2   Title Stand 20 minutes with pain not > 3-4/10.   Time 8   Period Weeks   Status New     PT LONG TERM GOAL #3   Title Walk a community distance with pain not > 3-4/10.   Time 8   Period Weeks   Status New     PT LONG TERM GOAL #4   Title Patient to subjectively report a 50% decrease in bilateral LE symptoms.   Time 8   Period Weeks   Status New     PT LONG TERM GOAL #5   Title Perform ADL's with pain not > 3-4/10.   Time 8   Period Weeks   Status New             Patient will benefit from skilled therapeutic intervention in order to improve the following deficits and impairments:     Visit Diagnosis: Pain in left thigh  Pain in right thigh  Chronic bilateral low back pain, with sciatica presence unspecified     Problem List Patient Active Problem List   Diagnosis Date Noted  . OSA on CPAP 07/27/2015  . Lip numbness  03/19/2015  . TIA (transient ischemic attack) 03/19/2015  . 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 Post op infection 02/05/2015  . History of lumbar laminectomy for spinal cord decompression 01/17/2015  . Lumbar spinal stenosis 01/17/2015  . Primary osteoarthritis of left knee 03/25/2014  . Rheumatoid arthritis of knee ([place]) 03/25/2014  . 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 DERMATITIS 09/28/2008  . CONSTIPATION 05/25/2008  . DYSPNEA ON EXERTION 05/25/2008  . ANEMIA, NORMOCYTIC 04/13/2008  . ANKLE PAIN, BILATERAL 11/25/2007  . [place] DISEASE 08/15/2006  . HLD (hyperlipidemia) 08/14/2006  . Morbid obesity ([place]) 12/13/2005  . CARPAL TUNNEL SYNDROME 12/13/2005  . PERIPHERAL NEUROPATHY 12/13/2005  . Essential hypertension 12/13/2005  . DYSRHYTHMIA, CARDIAC NOS 12/13/2005    Gregoria Selvy, [place] MPT 11/29/2015, 11:57 AM  [facility] [address] [place], [place], [address] Phone: [phone]   Fax:  [phone]  Name: [name] MRN: [id number] Date of Birth: Dec 21, 1945

## 2015-11-30 ENCOUNTER — Telehealth: Payer: Self-pay | Admitting: Rheumatology

## 2015-11-30 MED ORDER — HUMIRA PEN 40 MG/0.8ML ~~LOC~~ PNKT: 40.0000 mg | PEN_INJECTOR | SUBCUTANEOUS | 2 refills | Status: DC

## 2015-11-30 NOTE — Telephone Encounter (Signed)
Humira [facility] not go to [facility] it goes to [facility], she states she can get it at the [facility], so I have resent for her.

## 2015-11-30 NOTE — Telephone Encounter (Signed)
Patient states pharmacy does not have rx for Humira. Please resend to [facility] [place].

## 2015-12-06 ENCOUNTER — Telehealth: Payer: Self-pay | Admitting: Radiology

## 2015-12-06 NOTE — Telephone Encounter (Signed)
Need to send in Humira with ICD 10code, to [facility], will resend with this information

## 2015-12-07 MED ORDER — HUMIRA PEN 40 MG/0.8ML ~~LOC~~ PNKT: 40.0000 mg | PEN_INJECTOR | SUBCUTANEOUS | 2 refills | Status: DC

## 2015-12-08 ENCOUNTER — Ambulatory Visit: Payer: Medicare Other | Admitting: Physical Therapy

## 2015-12-08 ENCOUNTER — Encounter: Payer: Self-pay | Admitting: Physical Therapy

## 2015-12-08 DIAGNOSIS — M545 Low back pain: Secondary | ICD-10-CM

## 2015-12-08 DIAGNOSIS — M79652 Pain in left thigh: Secondary | ICD-10-CM

## 2015-12-08 DIAGNOSIS — M79651 Pain in right thigh: Secondary | ICD-10-CM

## 2015-12-08 DIAGNOSIS — G8929 Other chronic pain: Secondary | ICD-10-CM

## 2015-12-08 NOTE — Therapy (Signed)
[facility] [place], [place], [address] Phone: [phone]   Fax:  [phone]  Physical Therapy Treatment  Patient Details  Name: [name] MRN: [id number] Date of Birth: Jun 12, 1945 Referring Provider: Rodell Perna MD.  Encounter Date: 12/08/2015      PT End of Session - 12/08/15 1107    Visit Number 4   Number of Visits 16   Date for PT Re-Evaluation 01/22/16   PT Start Time 1031   PT Stop Time 1130   PT Time Calculation (min) 59 min   Activity Tolerance Patient tolerated treatment well   Behavior During Therapy [facility] for tasks assessed/performed      Past Medical History:  Diagnosis Date  . Anemia   . Arthritis    Rheumatoid, followed by Dr. Estanislado Pandy, knees & ankles   . CHF (congestive heart failure) ([place])   . H/O cardiovascular stress test 02/2014   seen by Dr. Einar Gip - told that she was cleared for surgery  . Heart murmur   . Hypertension   . Shortness of breath dyspnea    with exertion  . Sleep apnea    CPAP in use- QO night, study done at [facility]- 2007     Past Surgical History:  Procedure Laterality Date  . ABDOMINAL HYSTERECTOMY    . CARPAL TUNNEL RELEASE Right   . FOOT SURGERY Bilateral    bunionectomy, calluses , also has several pins & screws  . HAND SURGERY    . JOINT REPLACEMENT     planned for 03/25/2014  . KNEE SURGERY  1990's   . LUMBAR LAMINECTOMY/DECOMPRESSION MICRODISCECTOMY N/A 01/17/2015   Procedure: L2-3, L3-4 Decompression;  Surgeon: [name], MD;  Location: [place];  Service: Orthopedics;  Laterality: N/A;  . LUMBAR LAMINECTOMY/DECOMPRESSION MICRODISCECTOMY N/A 02/06/2015   Procedure: I AND D LUMBAR WITH WOUND VAC PLACEMENT;  Surgeon: [name], MD;  Location: [place];  Service: Orthopedics;  Laterality: N/A;  . TOTAL KNEE ARTHROPLASTY Left 03/25/2014   Procedure: TOTAL KNEE ARTHROPLASTY;  Surgeon: Garald Balding, MD;  Location: [place];  Service: Orthopedics;  Laterality: Left;   . TUBAL LIGATION      There were no vitals filed for this visit.      Subjective Assessment - 12/08/15 1054    Subjective Patient feels ongoing soreness yet feels 40% improvement overall   Limitations Walking   How long can you walk comfortably? Short distances.   Currently in Pain? Yes   Pain Score 3    Pain Location Back   Pain Orientation Right;Left   Pain Descriptors / Indicators Tightness   Pain Type Chronic pain   Pain Onset More than a month ago   Pain Frequency Constant   Aggravating Factors  prolong sitting then standing    Pain Relieving Factors at rest                         [facility] Adult PT Treatment/Exercise - 12/08/15 0001      Lumbar Exercises: Aerobic   Stationary Bike Nustep level 4 x 20 minutes.     Lumbar Exercises: Supine   Ab Set 3 seconds;10 reps   Bent Knee Raise 3 seconds  2x10     Moist Heat Therapy   Number Minutes Moist Heat 15 Minutes   Moist Heat Location Lumbar Spine     Electrical Stimulation   Electrical Stimulation Location Low back.  IFC 80-150hz  x 15  mins   Electrical Stimulation Goals Pain     Manual Therapy   Manual Therapy Soft tissue mobilization   Soft tissue mobilization gentle manual STW to bil low back paraspinals                  PT Short Term Goals - 12/08/15 1112      PT SHORT TERM GOAL #1   Title Ind with initial HEP.   Time 2   Period Weeks   Status Achieved           PT Long Term Goals - 11/23/15 1205      PT LONG TERM GOAL #1   Title Ind with advanced HEP.   Time 8   Period Weeks   Status New     PT LONG TERM GOAL #2   Title Stand 20 minutes with pain not > 3-4/10.   Time 8   Period Weeks   Status New     PT LONG TERM GOAL #3   Title Walk a community distance with pain not > 3-4/10.   Time 8   Period Weeks   Status New     PT LONG TERM GOAL #4   Title Patient to subjectively report a 50% decrease in bilateral LE symptoms.   Time 8   Period Weeks   Status  New     PT LONG TERM GOAL #5   Title Perform ADL's with pain not > 3-4/10.   Time 8   Period Weeks   Status New               Plan - 12/08/15 1112    Clinical Impression Statement Patient tolerated treatment well today and has a good understanding of core strength and posture. Patient doing HEP with good technique. Patient feels improvement overall yet still has ongoing symptoms in back and LE numbness. Patient met STG#1 and all other LTG's ongoing due to pain deficits.   Rehab Potential Good   PT Frequency 2x / week   PT Duration 8 weeks   PT Treatment/Interventions ADLs/Self Care Home Management;Electrical Stimulation;Cryotherapy;Moist Heat;Ultrasound;Therapeutic activities;Therapeutic exercise;Patient/family education;Manual techniques   PT Next Visit Plan STW/M to low back region; Nustep; bilateral LE strengthening; gait and balance activites.  Modalites to low back region. (MD. Lorin Mercy 12/14/15)   Consulted and Agree with Plan of Care Patient      Patient will benefit from skilled therapeutic intervention in order to improve the following deficits and impairments:  Pain, Decreased activity tolerance, Decreased strength, Postural dysfunction, Abnormal gait  Visit Diagnosis: Pain in left thigh  Pain in right thigh  Chronic bilateral low back pain, with sciatica presence unspecified     Problem List Patient Active Problem List   Diagnosis Date Noted  . OSA on CPAP 07/27/2015  . Lip numbness 03/19/2015  . TIA (transient ischemic attack) 03/19/2015  . 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 Post op infection 02/05/2015  . History of lumbar laminectomy for spinal cord decompression 01/17/2015  . Lumbar spinal stenosis 01/17/2015  . Primary osteoarthritis of left knee 03/25/2014  . Rheumatoid arthritis of knee ([place]) 03/25/2014  . 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 DERMATITIS 09/28/2008  . CONSTIPATION 05/25/2008  . DYSPNEA ON EXERTION  05/25/2008  . ANEMIA, NORMOCYTIC 04/13/2008  . ANKLE PAIN, BILATERAL 11/25/2007  . [place] DISEASE 08/15/2006  . HLD (hyperlipidemia) 08/14/2006  . Morbid obesity ([place]) 12/13/2005  . CARPAL TUNNEL SYNDROME 12/13/2005  . PERIPHERAL  NEUROPATHY 12/13/2005  . Essential hypertension 12/13/2005  . DYSRHYTHMIA, CARDIAC NOS 12/13/2005    Phillips Climes, PTA 12/08/2015, 11:31 AM  [facility], [place], [address] Phone: [phone]   Fax:  [phone]  Name: [name] MRN: [id number] Date of Birth: 28-Dec-1945

## 2015-12-09 ENCOUNTER — Ambulatory Visit: Payer: Medicare Other | Attending: Orthopaedic Surgery | Admitting: *Deleted

## 2015-12-09 DIAGNOSIS — M79652 Pain in left thigh: Secondary | ICD-10-CM | POA: Diagnosis present

## 2015-12-09 DIAGNOSIS — G8929 Other chronic pain: Secondary | ICD-10-CM | POA: Diagnosis present

## 2015-12-09 DIAGNOSIS — M79651 Pain in right thigh: Secondary | ICD-10-CM | POA: Insufficient documentation

## 2015-12-09 DIAGNOSIS — M545 Low back pain: Secondary | ICD-10-CM | POA: Diagnosis present

## 2015-12-09 NOTE — Therapy (Signed)
[facility] [place], [place], [address] Phone: [phone]   Fax:  [phone]  Physical Therapy Treatment  Patient Details  Name: [name] MRN: [id number] Date of Birth: 09/01/1945 Referring Provider: Rodell Perna MD.  Encounter Date: 12/09/2015      PT End of Session - 12/09/15 1227    Visit Number 5   Number of Visits 16   Date for PT Re-Evaluation 01/22/16   PT Start Time 1030   PT Stop Time 1130   PT Time Calculation (min) 60 min      Past Medical History:  Diagnosis Date  . Anemia   . Arthritis    Rheumatoid, followed by Dr. Estanislado Pandy, knees & ankles   . CHF (congestive heart failure) ([place])   . H/O cardiovascular stress test 02/2014   seen by Dr. Einar Gip - told that she was cleared for surgery  . Heart murmur   . Hypertension   . Shortness of breath dyspnea    with exertion  . Sleep apnea    CPAP in use- QO night, study done at [facility]- 2007     Past Surgical History:  Procedure Laterality Date  . ABDOMINAL HYSTERECTOMY    . CARPAL TUNNEL RELEASE Right   . FOOT SURGERY Bilateral    bunionectomy, calluses , also has several pins & screws  . HAND SURGERY    . JOINT REPLACEMENT     planned for 03/25/2014  . KNEE SURGERY  1990's   . LUMBAR LAMINECTOMY/DECOMPRESSION MICRODISCECTOMY N/A 01/17/2015   Procedure: L2-3, L3-4 Decompression;  Surgeon: [name], MD;  Location: [place];  Service: Orthopedics;  Laterality: N/A;  . LUMBAR LAMINECTOMY/DECOMPRESSION MICRODISCECTOMY N/A 02/06/2015   Procedure: I AND D LUMBAR WITH WOUND VAC PLACEMENT;  Surgeon: [name], MD;  Location: [place];  Service: Orthopedics;  Laterality: N/A;  . TOTAL KNEE ARTHROPLASTY Left 03/25/2014   Procedure: TOTAL KNEE ARTHROPLASTY;  Surgeon: Garald Balding, MD;  Location: [place];  Service: Orthopedics;  Laterality: Left;  . TUBAL LIGATION      There were no vitals filed for this visit.      Subjective Assessment - 12/09/15 1048     Subjective Patient feels ongoing soreness yet feels 40% improvement overall. MD 12-14-15   Limitations Walking   How long can you walk comfortably? Short distances.   Currently in Pain? Yes   Pain Score 3    Pain Location Back   Pain Orientation Right   Pain Descriptors / Indicators Tightness   Pain Type Chronic pain   Pain Onset More than a month ago   Pain Frequency Intermittent                         [facility] Adult PT Treatment/Exercise - 12/09/15 0001      Exercises   Exercises Knee/Hip     Lumbar Exercises: Aerobic   Stationary Bike Nustep level 4 x 20 minutes.     Lumbar Exercises: Seated   Sit to Stand 20 reps  Drawin with hinge bending at hip jt.  Keep LB straight     Lumbar Exercises: Supine   Ab Set --   Bent Knee Raise --     Modalities   Modalities Electrical Stimulation;Moist Heat     Moist Heat Therapy   Number Minutes Moist Heat 15 Minutes   Moist Heat Location Lumbar Spine     Electrical Stimulation   Electrical  Stimulation Location Low back.  IFC 80-150hz  x 15 mins   Electrical Stimulation Goals Pain     Manual Therapy   Manual Therapy Soft tissue mobilization   Soft tissue mobilization gentle manual STW to bil low back paraspinals        BIL hip flexor stretching in standing to help with erect posture             PT Short Term Goals - 12/08/15 1112      PT SHORT TERM GOAL #1   Title Ind with initial HEP.   Time 2   Period Weeks   Status Achieved           PT Long Term Goals - 11/23/15 1205      PT LONG TERM GOAL #1   Title Ind with advanced HEP.   Time 8   Period Weeks   Status New     PT LONG TERM GOAL #2   Title Stand 20 minutes with pain not > 3-4/10.   Time 8   Period Weeks   Status New     PT LONG TERM GOAL #3   Title Walk a community distance with pain not > 3-4/10.   Time 8   Period Weeks   Status New     PT LONG TERM GOAL #4   Title Patient to subjectively report a 50% decrease in  bilateral LE symptoms.   Time 8   Period Weeks   Status New     PT LONG TERM GOAL #5   Title Perform ADL's with pain not > 3-4/10.   Time 8   Period Weeks   Status New               Plan - 12/09/15 1228    Clinical Impression Statement Pt did fairly well with Rx today. We worked on functional  patterns for transitional movements with focus on sit to stand keeping a drawin and hinge bending at the hips to help decrease LBP during transitional movements   Rehab Potential Good   PT Frequency 2x / week   PT Duration 8 weeks   PT Treatment/Interventions ADLs/Self Care Home Management;Electrical Stimulation;Cryotherapy;Moist Heat;Ultrasound;Therapeutic activities;Therapeutic exercise;Patient/family education;Manual techniques   PT Next Visit Plan STW/M to low back region; Nustep; bilateral LE strengthening; gait and balance activites.  Modalites to low back region. (MD. Lorin Mercy 12/14/15)   Consulted and Agree with Plan of Care Patient      Patient will benefit from skilled therapeutic intervention in order to improve the following deficits and impairments:  Pain, Decreased activity tolerance, Decreased strength, Postural dysfunction, Abnormal gait  Visit Diagnosis: Pain in left thigh  Pain in right thigh  Chronic bilateral low back pain, with sciatica presence unspecified     Problem List Patient Active Problem List   Diagnosis Date Noted  . OSA on CPAP 07/27/2015  . Lip numbness 03/19/2015  . TIA (transient ischemic attack) 03/19/2015  . 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 Post op infection 02/05/2015  . History of lumbar laminectomy for spinal cord decompression 01/17/2015  . Lumbar spinal stenosis 01/17/2015  . Primary osteoarthritis of left knee 03/25/2014  . Rheumatoid arthritis of knee ([place]) 03/25/2014  . 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 DERMATITIS 09/28/2008  . CONSTIPATION 05/25/2008  . DYSPNEA ON EXERTION 05/25/2008  . ANEMIA,  NORMOCYTIC 04/13/2008  . ANKLE PAIN, BILATERAL 11/25/2007  . [place] DISEASE 08/15/2006  . HLD (hyperlipidemia) 08/14/2006  .  Morbid obesity ([place]) 12/13/2005  . CARPAL TUNNEL SYNDROME 12/13/2005  . PERIPHERAL NEUROPATHY 12/13/2005  . Essential hypertension 12/13/2005  . DYSRHYTHMIA, CARDIAC NOS 12/13/2005    Mizani Dilday,CHRIS, PTA 12/09/2015, 12:40 PM  [facility] [place], [place], [address] Phone: [phone]   Fax:  [phone]  Name: [name] MRN: [id number] Date of Birth: 02/28/1945

## 2015-12-13 ENCOUNTER — Encounter: Payer: Self-pay | Admitting: Physical Therapy

## 2015-12-13 ENCOUNTER — Ambulatory Visit: Payer: Medicare Other | Admitting: Physical Therapy

## 2015-12-13 DIAGNOSIS — M79652 Pain in left thigh: Secondary | ICD-10-CM | POA: Diagnosis not present

## 2015-12-13 DIAGNOSIS — M545 Low back pain: Secondary | ICD-10-CM

## 2015-12-13 DIAGNOSIS — M79651 Pain in right thigh: Secondary | ICD-10-CM

## 2015-12-13 DIAGNOSIS — G8929 Other chronic pain: Secondary | ICD-10-CM

## 2015-12-13 NOTE — Therapy (Signed)
[facility] [place], [place], [address] Phone: [phone]   Fax:  [phone]  Physical Therapy Treatment  Patient Details  Name: [name] MRN: [id number] Date of Birth: Jan 06, 1946 Referring Provider: Rodell Perna MD.  Encounter Date: 12/13/2015      PT End of Session - 12/13/15 1127    Visit Number 6   Number of Visits 16   Date for PT Re-Evaluation 01/22/16   PT Start Time [id number]   PT Stop Time 1131   PT Time Calculation (min) 48 min   Activity Tolerance Patient tolerated treatment well   Behavior During Therapy [facility] for tasks assessed/performed      Past Medical History:  Diagnosis Date  . Anemia   . Arthritis    Rheumatoid, followed by Dr. Estanislado Pandy, knees & ankles   . CHF (congestive heart failure) ([place])   . H/O cardiovascular stress test 02/2014   seen by Dr. Einar Gip - told that she was cleared for surgery  . Heart murmur   . Hypertension   . Shortness of breath dyspnea    with exertion  . Sleep apnea    CPAP in use- QO night, study done at [facility]- 2007     Past Surgical History:  Procedure Laterality Date  . ABDOMINAL HYSTERECTOMY    . CARPAL TUNNEL RELEASE Right   . FOOT SURGERY Bilateral    bunionectomy, calluses , also has several pins & screws  . HAND SURGERY    . JOINT REPLACEMENT     planned for 03/25/2014  . KNEE SURGERY  1990's   . LUMBAR LAMINECTOMY/DECOMPRESSION MICRODISCECTOMY N/A 01/17/2015   Procedure: L2-3, L3-4 Decompression;  Surgeon: [name], MD;  Location: [place];  Service: Orthopedics;  Laterality: N/A;  . LUMBAR LAMINECTOMY/DECOMPRESSION MICRODISCECTOMY N/A 02/06/2015   Procedure: I AND D LUMBAR WITH WOUND VAC PLACEMENT;  Surgeon: [name], MD;  Location: [place];  Service: Orthopedics;  Laterality: N/A;  . TOTAL KNEE ARTHROPLASTY Left 03/25/2014   Procedure: TOTAL KNEE ARTHROPLASTY;  Surgeon: Garald Balding, MD;  Location: [place];  Service: Orthopedics;  Laterality: Left;   . TUBAL LIGATION      There were no vitals filed for this visit.      Subjective Assessment - 12/13/15 1125    Subjective Reports that she still has some soreness in low back but woke up this morning and had a dull ache in R lateral thigh. Reports that the more active she is or more walking she does the discomfort will decrease. Reports having to take care of her mother and may have done something to her back then.   Limitations Walking   How long can you walk comfortably? Short distances.   Currently in Pain? Yes   Pain Score 4    Pain Location Back   Pain Orientation Right;Lower   Pain Descriptors / Indicators Aching   Pain Type Chronic pain   Pain Onset More than a month ago   Pain Frequency Intermittent            [facility] PT Assessment - 12/13/15 0001      Assessment   Medical Diagnosis S/p lumbar decompression at L2-3; L3-4.   Onset Date/Surgical Date 01/17/15   Next MD Visit 12/14/2015     Precautions   Precautions None     Restrictions   Weight Bearing Restrictions No  [place] Adult PT Treatment/Exercise - 12/13/15 0001      Bed Mobility   Bed Mobility Sit to Supine;Supine to Sit  Patient educated regarding logrolling technique with demo     Lumbar Exercises: Aerobic   Stationary Bike Nustep level 5 x 20 minutes.     Lumbar Exercises: Seated   Sit to Stand 10 reps   Sit to Stand Limitations Emphasis on hip flexion to maintiain erect lumbar spine     Lumbar Exercises: Supine   Ab Set 20 reps;5 seconds   Clam 20 reps  "feel it a little" in low back   Heel Slides 15 reps   Heel Slides Limitations BLE   Bent Knee Raise 20 reps   Bent Knee Raise Limitations BLE   Straight Leg Raise 20 reps   Straight Leg Raises Limitations BLE     Modalities   Modalities Electrical Stimulation;Moist Heat     Moist Heat Therapy   Number Minutes Moist Heat 15 Minutes   Moist Heat Location Lumbar Spine     Electrical Stimulation    Electrical Stimulation Location B lumbar paraspinals   Electrical Stimulation Action IFC   Electrical Stimulation Parameters 80-150 hz x15 min   Electrical Stimulation Goals Pain                  PT Short Term Goals - 12/08/15 1112      PT SHORT TERM GOAL #1   Title Ind with initial HEP.   Time 2   Period Weeks   Status Achieved           PT Long Term Goals - 12/13/15 1128      PT LONG TERM GOAL #1   Title Ind with advanced HEP.   Time 8   Period Weeks   Status On-going     PT LONG TERM GOAL #2   Title Stand 20 minutes with pain not > 3-4/10.   Time 8   Period Weeks   Status Achieved  She can if she is holding to AD per patient report 12/13/2015     PT LONG TERM GOAL #3   Title Walk a community distance with pain not > 3-4/10.   Time 8   Period Weeks   Status On-going  Uses buggy or rolling cart at stores per patient report 12/13/2015     PT LONG TERM GOAL #4   Title Patient to subjectively report a 50% decrease in bilateral LE symptoms.   Time 8   Period Weeks   Status Achieved  Only occasional LE symptoms per patient report 12/13/2015     PT LONG TERM GOAL #5   Title Perform ADL's with pain not > 3-4/10.   Time 8   Period Weeks   Status On-going  Depending on the ADLs as she requires breaks especially with ADLs such as vacuuming and washing dishes per patient report 12/13/2015               Plan - 12/13/15 1127    Clinical Impression Statement Patient tolerated today's treatment fairly well today as she was able to complete exercises as directed with only minimal reports of discomfort. Patient educated regarding log rolling technique in bed but upon observing patient following the education patient did not utilize the technique. Emphasis again placed on hip flexion with sit to stands to maintain straight lumbar spine. Patient slowly advancing with goals at this time with on-going goals at this time being ADLs and community ambulation  secondary  to pain reports. Normal modalities response noted following removal of the modalities. Patient continues to use SPC at this time for ambulation.   Rehab Potential Good   PT Frequency 2x / week   PT Duration 8 weeks   PT Treatment/Interventions ADLs/Self Care Home Management;Electrical Stimulation;Cryotherapy;Moist Heat;Ultrasound;Therapeutic activities;Therapeutic exercise;Patient/family education;Manual techniques   PT Next Visit Plan Continue with core strengthening with modalities and manual therapy PRN per mPT POC.   Consulted and Agree with Plan of Care Patient      Patient will benefit from skilled therapeutic intervention in order to improve the following deficits and impairments:  Pain, Decreased activity tolerance, Decreased strength, Postural dysfunction, Abnormal gait  Visit Diagnosis: Pain in left thigh  Pain in right thigh  Chronic bilateral low back pain, with sciatica presence unspecified     Problem List Patient Active Problem List   Diagnosis Date Noted  . OSA on CPAP 07/27/2015  . Lip numbness 03/19/2015  . TIA (transient ischemic attack) 03/19/2015  . 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 Post op infection 02/05/2015  . History of lumbar laminectomy for spinal cord decompression 01/17/2015  . Lumbar spinal stenosis 01/17/2015  . Primary osteoarthritis of left knee 03/25/2014  . Rheumatoid arthritis of knee ([place]) 03/25/2014  . 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 DERMATITIS 09/28/2008  . CONSTIPATION 05/25/2008  . DYSPNEA ON EXERTION 05/25/2008  . ANEMIA, NORMOCYTIC 04/13/2008  . ANKLE PAIN, BILATERAL 11/25/2007  . [place] DISEASE 08/15/2006  . HLD (hyperlipidemia) 08/14/2006  . Morbid obesity ([place]) 12/13/2005  . CARPAL TUNNEL SYNDROME 12/13/2005  . PERIPHERAL NEUROPATHY 12/13/2005  . Essential hypertension 12/13/2005  . DYSRHYTHMIA, CARDIAC NOS 12/13/2005   Ahmed Prima, PTA 12/13/15 12:01 PM [place]  MPT [facility] [address] [place], [place], [address] Phone: [phone]   Fax:  [phone]  Name: [name] MRN: [id number] Date of Birth: August 09, 1945

## 2015-12-14 ENCOUNTER — Encounter (INDEPENDENT_AMBULATORY_CARE_PROVIDER_SITE_OTHER): Payer: Self-pay | Admitting: Orthopaedic Surgery

## 2015-12-14 ENCOUNTER — Ambulatory Visit (INDEPENDENT_AMBULATORY_CARE_PROVIDER_SITE_OTHER): Payer: Medicare Other | Admitting: Orthopaedic Surgery

## 2015-12-14 VITALS — BP 140/92 | HR 76 | Ht 61.0 in | Wt 216.0 lb

## 2015-12-14 DIAGNOSIS — M869 Osteomyelitis, unspecified: Secondary | ICD-10-CM

## 2015-12-14 DIAGNOSIS — Z9889 Other specified postprocedural states: Secondary | ICD-10-CM

## 2015-12-14 DIAGNOSIS — M4626 Osteomyelitis of vertebra, lumbar region: Secondary | ICD-10-CM

## 2015-12-14 NOTE — Progress Notes (Signed)
Office Visit Note   Patient: [name]           Date of Birth: Aug 09, 1945           MRN: [id number] Visit Date: 12/14/2015              Requested by: Iona Beard, MD [address] [place],  [address] PCP: Maggie Font, MD   Assessment & Plan: Visit Diagnoses:  1. History of lumbar laminectomy for spinal cord decompression   2. 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[employer])     Plan: Patient will continue her physical therapy for lower extremity strengthening. She isn't sure the single-point cane to prevent falling. Will check a sedimentation rate CRP make sure they continue their downward trend. I will recheck her in one month to review and update physical therapy orders.  Follow-Up Instructions: Return in about 1 month (around 01/14/2016).   Orders:  No orders of the defined types were placed in this encounter.  No orders of the defined types were placed in this encounter.     Procedures: No procedures performed   Clinical Data: No additional findings.   Subjective: Chief Complaint  Patient presents with  . Lower Back - Pain    Patient returns for one month follow up low back pain. She states that she is doing some better. She has been going to physical therapy which has helped. Patient did discuss that she had labwork in [facility] 11/09/2015. That was lab work ordered by Dr. Estanislado Pandy.  The labs that were in the plan from the last visit were not drawn here per the patient.    Review of Systems  Constitutional: Negative for chills and diaphoresis.  HENT: Negative for ear discharge, ear pain and nosebleeds.   Eyes: Negative for discharge and visual disturbance.  Respiratory: Negative for cough, choking and shortness of breath.   Cardiovascular: Negative for chest pain and palpitations.  Gastrointestinal: Negative for abdominal distention and abdominal pain.  Endocrine: Negative for cold intolerance and heat intolerance.  Genitourinary: Negative for flank  pain and hematuria.  Musculoskeletal:       [place] lumbar decompression January 2017 with the postop superficial infection resolved.  Skin: Negative for rash and wound.  Neurological: Negative for seizures and speech difficulty.  Hematological: Negative for adenopathy. Does not bruise/bleed easily.  Psychiatric/Behavioral: Negative for agitation and suicidal ideas.     Objective: Vital Signs: BP (!) 140/92   Pulse 76   Ht 5\' 1"  (1.549 m)   Wt 216 lb (98 kg)   BMI 40.81 kg/m   Physical Exam  Constitutional: She is oriented to person, place, and time. She appears well-developed.  HENT:  Head: Normocephalic.  Right Ear: External ear normal.  Left Ear: External ear normal.  Eyes: Pupils are equal, round, and reactive to light.  Neck: No tracheal deviation present. No thyromegaly present.  Cardiovascular: Normal rate.   Pulmonary/Chest: Effort normal.  Abdominal: Soft.  Musculoskeletal:  Lumbar incision is well-healed. She has some scarring no depression. No cellulitis. Skin has healed nicely. Anterior tibiotalar strong negative straight leg raising 90. Previous total knee arthroplasty on the left is doing well right knee still has some crepitus which has known osteoarthritis. Pulses are intact negative Homans no rash over exposed skin.  Neurological: She is alert and oriented to person, place, and time.  Skin: Skin is warm and dry.  Psychiatric: She has a normal mood and affect. Her behavior is normal.  Ortho Exam she has fairly good quad strength slight weakness on the right with crepitus knee range of motion collateral ligaments are stable palpable osteophytes anterior tib EHL is strong gastrocsoleus is strong. She still has some balance issues to use a single-point cane.  Specialty Comments:  No specialty comments available.  Imaging: No results found.   PMFS History: Patient Active Problem List   Diagnosis Date Noted  . OSA on CPAP 07/27/2015  . Lip numbness  03/19/2015  . TIA (transient ischemic attack) 03/19/2015  . 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[place]) 02/06/2015  . Post op infection 02/05/2015  . History of lumbar laminectomy for spinal cord decompression 01/17/2015  . Lumbar spinal stenosis 01/17/2015  . Primary osteoarthritis of left knee 03/25/2014  . Rheumatoid arthritis of knee ([place]) 03/25/2014  . 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 DERMATITIS 09/28/2008  . CONSTIPATION 05/25/2008  . DYSPNEA ON EXERTION 05/25/2008  . ANEMIA, NORMOCYTIC 04/13/2008  . ANKLE PAIN, BILATERAL 11/25/2007  . [place] DISEASE 08/15/2006  . HLD (hyperlipidemia) 08/14/2006  . Morbid obesity ([place]) 12/13/2005  . CARPAL TUNNEL SYNDROME 12/13/2005  . PERIPHERAL NEUROPATHY 12/13/2005  . Essential hypertension 12/13/2005  . DYSRHYTHMIA, CARDIAC NOS 12/13/2005   Past Medical History:  Diagnosis Date  . Anemia   . Arthritis    Rheumatoid, followed by Dr. Estanislado Pandy, knees & ankles   . CHF (congestive heart failure) ([place])   . H/O cardiovascular stress test 02/2014   seen by Dr. Einar Gip - told that she was cleared for surgery  . Heart murmur   . Hypertension   . Shortness of breath dyspnea    with exertion  . Sleep apnea    CPAP in use- QO night, study done at [facility]- 2007     Family History  Problem Relation Age of Onset  . Seizures Mother   . Transient ischemic attack Mother   . Heart disease Father     CHF  . Stroke Sister     Past Surgical History:  Procedure Laterality Date  . ABDOMINAL HYSTERECTOMY    . CARPAL TUNNEL RELEASE Right   . FOOT SURGERY Bilateral    bunionectomy, calluses , also has several pins & screws  . HAND SURGERY    . JOINT REPLACEMENT     planned for 03/25/2014  . KNEE SURGERY  1990's   . LUMBAR LAMINECTOMY/DECOMPRESSION MICRODISCECTOMY N/A 01/17/2015   Procedure: L2-3, L3-4 Decompression;  Surgeon: [name], MD;  Location: [place];  Service: Orthopedics;  Laterality: N/A;  . LUMBAR  LAMINECTOMY/DECOMPRESSION MICRODISCECTOMY N/A 02/06/2015   Procedure: I AND D LUMBAR WITH WOUND VAC PLACEMENT;  Surgeon: [name], MD;  Location: [place];  Service: Orthopedics;  Laterality: N/A;  . TOTAL KNEE ARTHROPLASTY Left 03/25/2014   Procedure: TOTAL KNEE ARTHROPLASTY;  Surgeon: Garald Balding, MD;  Location: [place];  Service: Orthopedics;  Laterality: Left;  . TUBAL LIGATION     Social History   Occupational History  . Not on file.   Social History Main Topics  . Smoking status: Former Smoker    Quit date: 03/16/1980  . Smokeless tobacco: Never Used  . Alcohol use No  . Drug use: No  . Sexual activity: Not on file

## 2015-12-14 NOTE — Addendum Note (Signed)
Addended by: Maxcine Ham on: 12/14/2015 08:50 AM   Modules accepted: Orders

## 2015-12-15 ENCOUNTER — Other Ambulatory Visit (INDEPENDENT_AMBULATORY_CARE_PROVIDER_SITE_OTHER): Payer: Self-pay | Admitting: Radiology

## 2015-12-15 ENCOUNTER — Ambulatory Visit: Payer: Medicare Other | Admitting: *Deleted

## 2015-12-15 DIAGNOSIS — M48061 Spinal stenosis, lumbar region without neurogenic claudication: Secondary | ICD-10-CM

## 2015-12-15 DIAGNOSIS — M545 Low back pain: Secondary | ICD-10-CM

## 2015-12-15 DIAGNOSIS — M79652 Pain in left thigh: Secondary | ICD-10-CM

## 2015-12-15 DIAGNOSIS — M79651 Pain in right thigh: Secondary | ICD-10-CM

## 2015-12-15 DIAGNOSIS — G8929 Other chronic pain: Secondary | ICD-10-CM

## 2015-12-15 LAB — SEDIMENTATION RATE: Sed Rate: 51 mm/hr — ABNORMAL HIGH (ref 0–30)

## 2015-12-15 LAB — C-REACTIVE PROTEIN: CRP: 45.9 mg/L — ABNORMAL HIGH (ref <8.0)

## 2015-12-15 NOTE — Therapy (Signed)
[facility] [place], [place], [address] Phone: [phone]   Fax:  [phone]  Physical Therapy Treatment  Patient Details  Name: [name] MRN: [id number] Date of Birth: [birth year] Referring Provider: Rodell Perna MD.  Encounter Date: 12/15/2015      PT End of Session - 12/15/15 1519    Visit Number 7   Number of Visits 16   Date for PT Re-Evaluation 01/22/16   PT Start Time 1430   PT Stop Time [id number]   PT Time Calculation (min) 49 min      Past Medical History:  Diagnosis Date  . Anemia   . Arthritis    Rheumatoid, followed by Dr. Estanislado Pandy, knees & ankles   . CHF (congestive heart failure) ([place])   . H/O cardiovascular stress test 02/2014   seen by Dr. Einar Gip - told that she was cleared for surgery  . Heart murmur   . Hypertension   . Shortness of breath dyspnea    with exertion  . Sleep apnea    CPAP in use- QO night, [facility] done at [facility]- 2007     Past Surgical History:  Procedure Laterality Date  . ABDOMINAL HYSTERECTOMY    . CARPAL TUNNEL RELEASE Right   . FOOT SURGERY Bilateral    bunionectomy, calluses , also has several pins & screws  . HAND SURGERY    . JOINT REPLACEMENT     planned for 03/25/2014  . KNEE SURGERY  1990's   . LUMBAR LAMINECTOMY/DECOMPRESSION MICRODISCECTOMY N/A 01/17/2015   Procedure: L2-3, L3-4 Decompression;  Surgeon: [name], MD;  Location: [place];  Service: Orthopedics;  Laterality: N/A;  . LUMBAR LAMINECTOMY/DECOMPRESSION MICRODISCECTOMY N/A 02/06/2015   Procedure: I AND D LUMBAR WITH WOUND VAC PLACEMENT;  Surgeon: [name], MD;  Location: [place];  Service: Orthopedics;  Laterality: N/A;  . TOTAL KNEE ARTHROPLASTY Left 03/25/2014   Procedure: TOTAL KNEE ARTHROPLASTY;  Surgeon: Garald Balding, MD;  Location: [place];  Service: Orthopedics;  Laterality: Left;  . TUBAL LIGATION      There were no vitals filed for this visit.      Subjective Assessment - 12/15/15 1438     Subjective Pt went to MD yesterday and he wants her to continue with PT. He did blood test as well and wants to do an MRI to R/O any infection in LB   Limitations Walking   How long can you walk comfortably? Short distances.   Currently in Pain? Yes   Pain Score 4    Pain Location Back   Pain Orientation Right;Lower   Pain Descriptors / Indicators Aching   Pain Type Chronic pain   Pain Onset More than a month ago   Pain Frequency Intermittent                         [facility] Adult PT Treatment/Exercise - 12/15/15 0001      Exercises   Exercises Knee/Hip     Lumbar Exercises: Aerobic   Stationary Bike Nustep level 5 x 20 minutes.     Lumbar Exercises: Supine   Ab Set 20 reps;5 seconds   Clam 20 reps  "feel it a little" in low back   Bent Knee Raise 20 reps   Bent Knee Raise Limitations BLE     Modalities   Modalities Electrical Stimulation;Moist Heat     Moist Heat Therapy   Number Minutes Moist Heat 15  Minutes   Moist Heat Location Lumbar Spine     Electrical Stimulation   Electrical Stimulation Location Low back.  IFC 80-150hz  x 15 mins   Electrical Stimulation Goals Pain                  PT Short Term Goals - 12/08/15 1112      PT SHORT TERM GOAL #1   Title Ind with initial HEP.   Time 2   Period Weeks   Status Achieved           PT Long Term Goals - 12/13/15 1128      PT LONG TERM GOAL #1   Title Ind with advanced HEP.   Time 8   Period Weeks   Status On-going     PT LONG TERM GOAL #2   Title Stand 20 minutes with pain not > 3-4/10.   Time 8   Period Weeks   Status Achieved  She can if she is holding to AD per patient report 12/13/2015     PT LONG TERM GOAL #3   Title Walk a community distance with pain not > 3-4/10.   Time 8   Period Weeks   Status On-going  Uses buggy or rolling cart at stores per patient report 12/13/2015     PT LONG TERM GOAL #4   Title Patient to subjectively report a 50% decrease in  bilateral LE symptoms.   Time 8   Period Weeks   Status Achieved  Only occasional LE symptoms per patient report 12/13/2015     PT LONG TERM GOAL #5   Title Perform ADL's with pain not > 3-4/10.   Time 8   Period Weeks   Status On-going  Depending on the ADLs as she requires breaks especially with ADLs such as vacuuming and washing dishes per patient report 12/13/2015               Plan - 12/15/15 1510    Clinical Impression Statement Pt  arrived to clinic today doing fairly well with moderate LBP. She went to MD yesterday and He wants her to continue with PT. He did some blood work and feels she may have an infection some where and is going to schedule an MRI  to check her back.. She did fairly well with PT today and was able to complete therex with minimal pain increase. Normal modality response today   Rehab Potential Good   PT Frequency 2x / week   PT Duration 8 weeks   PT Treatment/Interventions ADLs/Self Care Home Management;Electrical Stimulation;Cryotherapy;Moist Heat;Ultrasound;Therapeutic activities;Therapeutic exercise;Patient/family education;Manual techniques   PT Next Visit Plan Continue with core strengthening with modalities and manual therapy PRN per mPT POC.   MD note to cont PT?   Consulted and Agree with Plan of Care Patient      Patient will benefit from skilled therapeutic intervention in order to improve the following deficits and impairments:  Pain, Decreased activity tolerance, Decreased strength, Postural dysfunction, Abnormal gait  Visit Diagnosis: Pain in left thigh  Pain in right thigh  Chronic bilateral low back pain, with sciatica presence unspecified     Problem List Patient Active Problem List   Diagnosis Date Noted  . OSA on CPAP 07/27/2015  . Lip numbness 03/19/2015  . TIA (transient ischemic attack) 03/19/2015  . 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[place]) 02/06/2015  . Post op infection 02/05/2015  . History of lumbar laminectomy for spinal  cord decompression 01/17/2015  . Lumbar  spinal stenosis 01/17/2015  . Primary osteoarthritis of left knee 03/25/2014  . Rheumatoid arthritis of knee ([place]) 03/25/2014  . 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 DERMATITIS 09/28/2008  . CONSTIPATION 05/25/2008  . DYSPNEA ON EXERTION 05/25/2008  . ANEMIA, NORMOCYTIC 04/13/2008  . ANKLE PAIN, BILATERAL 11/25/2007  . [place] DISEASE 08/15/2006  . HLD (hyperlipidemia) 08/14/2006  . Morbid obesity ([place]) 12/13/2005  . CARPAL TUNNEL SYNDROME 12/13/2005  . PERIPHERAL NEUROPATHY 12/13/2005  . Essential hypertension 12/13/2005  . DYSRHYTHMIA, CARDIAC NOS 12/13/2005    RAMSEUR,CHRIS, PTA 12/15/2015, 3:20 PM  [facility] [place], [place], [address] Phone: [phone]   Fax:  [phone]  Name: [name] MRN: [id number] Date of Birth: [birth year]

## 2015-12-20 ENCOUNTER — Ambulatory Visit: Payer: Medicare Other | Admitting: Physical Therapy

## 2015-12-20 DIAGNOSIS — G8929 Other chronic pain: Secondary | ICD-10-CM

## 2015-12-20 DIAGNOSIS — M79651 Pain in right thigh: Secondary | ICD-10-CM

## 2015-12-20 DIAGNOSIS — M79652 Pain in left thigh: Secondary | ICD-10-CM | POA: Diagnosis not present

## 2015-12-20 DIAGNOSIS — M545 Low back pain: Secondary | ICD-10-CM

## 2015-12-20 NOTE — Therapy (Signed)
[facility] [place], [place], [address] Phone: [phone]   Fax:  [phone]  Physical Therapy Treatment  Patient Details  Name: [name] MRN: [id number] Date of Birth: [birth year] Referring Provider: Rodell Perna MD.  Encounter Date: 12/20/2015      PT End of Session - 12/20/15 1120    Visit Number 8   Number of Visits 16   Date for PT Re-Evaluation 01/22/16   PT Start Time [id number]   PT Stop Time 1123   PT Time Calculation (min) 51 min   Activity Tolerance Patient tolerated treatment well   Behavior During Therapy [facility] for tasks assessed/performed      Past Medical History:  Diagnosis Date  . Anemia   . Arthritis    Rheumatoid, followed by Dr. Estanislado Pandy, knees & ankles   . CHF (congestive heart failure) ([place])   . H/O cardiovascular stress test 02/2014   seen by Dr. Einar Gip - told that she was cleared for surgery  . Heart murmur   . Hypertension   . Shortness of breath dyspnea    with exertion  . Sleep apnea    CPAP in use- QO night, study done at [facility]- 2007     Past Surgical History:  Procedure Laterality Date  . ABDOMINAL HYSTERECTOMY    . CARPAL TUNNEL RELEASE Right   . FOOT SURGERY Bilateral    bunionectomy, calluses , also has several pins & screws  . HAND SURGERY    . JOINT REPLACEMENT     planned for 03/25/2014  . KNEE SURGERY  1990's   . LUMBAR LAMINECTOMY/DECOMPRESSION MICRODISCECTOMY N/A 01/17/2015   Procedure: L2-3, L3-4 Decompression;  Surgeon: [name], MD;  Location: [place];  Service: Orthopedics;  Laterality: N/A;  . LUMBAR LAMINECTOMY/DECOMPRESSION MICRODISCECTOMY N/A 02/06/2015   Procedure: I AND D LUMBAR WITH WOUND VAC PLACEMENT;  Surgeon: [name], MD;  Location: [place];  Service: Orthopedics;  Laterality: N/A;  . TOTAL KNEE ARTHROPLASTY Left 03/25/2014   Procedure: TOTAL KNEE ARTHROPLASTY;  Surgeon: Garald Balding, MD;  Location: [place];  Service: Orthopedics;  Laterality: Left;   . TUBAL LIGATION      There were no vitals filed for this visit.                       [place] Adult PT Treatment/Exercise - 12/20/15 0001      Exercises   Exercises Knee/Hip     Lumbar Exercises: Aerobic   Stationary Bike Nustep level 5 x 20 minutes f/b core stab in parallel bars with draw-in on rockerboard x 3 minutes.     Modalities   Modalities Electrical Stimulation;Moist Heat     Moist Heat Therapy   Number Minutes Moist Heat 15 Minutes   Moist Heat Location Lumbar Spine     Electrical Stimulation   Electrical Stimulation Location Low back.   Electrical Stimulation Action IFC   Electrical Stimulation Parameters 80-150 Hz.   Electrical Stimulation Goals Pain                  PT Short Term Goals - 12/08/15 1112      PT SHORT TERM GOAL #1   Title Ind with initial HEP.   Time 2   Period Weeks   Status Achieved           PT Long Term Goals - 12/13/15 1128      PT LONG TERM GOAL #1  Title Ind with advanced HEP.   Time 8   Period Weeks   Status On-going     PT LONG TERM GOAL #2   Title Stand 20 minutes with pain not > 3-4/10.   Time 8   Period Weeks   Status Achieved  She can if she is holding to AD per patient report 12/13/2015     PT LONG TERM GOAL #3   Title Walk a community distance with pain not > 3-4/10.   Time 8   Period Weeks   Status On-going  Uses buggy or rolling cart at stores per patient report 12/13/2015     PT LONG TERM GOAL #4   Title Patient to subjectively report a 50% decrease in bilateral LE symptoms.   Time 8   Period Weeks   Status Achieved  Only occasional LE symptoms per patient report 12/13/2015     PT LONG TERM GOAL #5   Title Perform ADL's with pain not > 3-4/10.   Time 8   Period Weeks   Status On-going  Depending on the ADLs as she requires breaks especially with ADLs such as vacuuming and washing dishes per patient report 12/13/2015             Patient will benefit from skilled  therapeutic intervention in order to improve the following deficits and impairments:  Pain, Decreased activity tolerance, Decreased strength, Postural dysfunction, Abnormal gait  Visit Diagnosis: Pain in left thigh  Pain in right thigh  Chronic bilateral low back pain, with sciatica presence unspecified     Problem List Patient Active Problem List   Diagnosis Date Noted  . OSA on CPAP 07/27/2015  . Lip numbness 03/19/2015  . TIA (transient ischemic attack) 03/19/2015  . 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[place]) 02/06/2015  . Post op infection 02/05/2015  . History of lumbar laminectomy for spinal cord decompression 01/17/2015  . Lumbar spinal stenosis 01/17/2015  . Primary osteoarthritis of left knee 03/25/2014  . Rheumatoid arthritis of knee ([place]) 03/25/2014  . 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 DERMATITIS 09/28/2008  . CONSTIPATION 05/25/2008  . DYSPNEA ON EXERTION 05/25/2008  . ANEMIA, NORMOCYTIC 04/13/2008  . ANKLE PAIN, BILATERAL 11/25/2007  . [place] DISEASE 08/15/2006  . HLD (hyperlipidemia) 08/14/2006  . Morbid obesity ([place]) 12/13/2005  . CARPAL TUNNEL SYNDROME 12/13/2005  . PERIPHERAL NEUROPATHY 12/13/2005  . Essential hypertension 12/13/2005  . DYSRHYTHMIA, CARDIAC NOS 12/13/2005    Marli Diego, [place] MPT 12/20/2015, 11:24 AM  [facility] [address] [place], [place], [address] Phone: [phone]   Fax:  [phone]  Name: SHYNICE WOODHOUSE MRN: [id number] Date of Birth: Jun 04, 1945

## 2015-12-22 ENCOUNTER — Ambulatory Visit: Payer: Medicare Other | Admitting: Physical Therapy

## 2015-12-22 DIAGNOSIS — G8929 Other chronic pain: Secondary | ICD-10-CM

## 2015-12-22 DIAGNOSIS — M79652 Pain in left thigh: Secondary | ICD-10-CM

## 2015-12-22 DIAGNOSIS — M79651 Pain in right thigh: Secondary | ICD-10-CM

## 2015-12-22 DIAGNOSIS — M545 Low back pain: Secondary | ICD-10-CM

## 2015-12-22 NOTE — Therapy (Signed)
[facility] [place], [place], [address] Phone: [phone]   Fax:  [phone]  Physical Therapy Treatment  Patient Details  Name: [name] MRN: [id number] Date of Birth: 1945/01/11 Referring Provider: Rodell Perna MD.  Encounter Date: 12/22/2015      PT End of Session - 12/22/15 1314    Visit Number 9   Number of Visits 16   Date for PT Re-Evaluation 01/22/16   PT Start Time 1030   PT Stop Time 1121   PT Time Calculation (min) 51 min   Activity Tolerance Patient tolerated treatment well   Behavior During Therapy [facility] for tasks assessed/performed      Past Medical History:  Diagnosis Date  . Anemia   . Arthritis    Rheumatoid, followed by Dr. Estanislado Pandy, knees & ankles   . CHF (congestive heart failure) ([place])   . H/O cardiovascular stress test 02/2014   seen by Dr. Einar Gip - told that she was cleared for surgery  . Heart murmur   . Hypertension   . Shortness of breath dyspnea    with exertion  . Sleep apnea    CPAP in use- QO night, study done at [facility]- 2007     Past Surgical History:  Procedure Laterality Date  . ABDOMINAL HYSTERECTOMY    . CARPAL TUNNEL RELEASE Right   . FOOT SURGERY Bilateral    bunionectomy, calluses , also has several pins & screws  . HAND SURGERY    . JOINT REPLACEMENT     planned for 03/25/2014  . KNEE SURGERY  1990's   . LUMBAR LAMINECTOMY/DECOMPRESSION MICRODISCECTOMY N/A 01/17/2015   Procedure: L2-3, L3-4 Decompression;  Surgeon: [name], MD;  Location: [place];  Service: Orthopedics;  Laterality: N/A;  . LUMBAR LAMINECTOMY/DECOMPRESSION MICRODISCECTOMY N/A 02/06/2015   Procedure: I AND D LUMBAR WITH WOUND VAC PLACEMENT;  Surgeon: [name], MD;  Location: [place];  Service: Orthopedics;  Laterality: N/A;  . TOTAL KNEE ARTHROPLASTY Left 03/25/2014   Procedure: TOTAL KNEE ARTHROPLASTY;  Surgeon: Garald Balding, MD;  Location: [place];  Service: Orthopedics;  Laterality: Left;   . TUBAL LIGATION      There were no vitals filed for this visit.      Subjective Assessment - 12/22/15 1313    Subjective My back is doing better.   Pain Score 2    Pain Location Back   Pain Orientation Right;Lower   Pain Descriptors / Indicators Aching   Pain Type Chronic pain   Pain Onset More than a month ago     Treatment:  Nustep level 5 x 20 minutes f/b rockerboad with draw-in for core stabilization f/b HMP and IFC to patient's low back x 15 minutes.  Patient doing great.                              PT Short Term Goals - 12/08/15 1112      PT SHORT TERM GOAL #1   Title Ind with initial HEP.   Time 2   Period Weeks   Status Achieved           PT Long Term Goals - 12/13/15 1128      PT LONG TERM GOAL #1   Title Ind with advanced HEP.   Time 8   Period Weeks   Status On-going     PT LONG TERM GOAL #2   Title Stand  20 minutes with pain not > 3-4/10.   Time 8   Period Weeks   Status Achieved  She can if she is holding to AD per patient report 12/13/2015     PT LONG TERM GOAL #3   Title Walk a community distance with pain not > 3-4/10.   Time 8   Period Weeks   Status On-going  Uses buggy or rolling cart at stores per patient report 12/13/2015     PT LONG TERM GOAL #4   Title Patient to subjectively report a 50% decrease in bilateral LE symptoms.   Time 8   Period Weeks   Status Achieved  Only occasional LE symptoms per patient report 12/13/2015     PT LONG TERM GOAL #5   Title Perform ADL's with pain not > 3-4/10.   Time 8   Period Weeks   Status On-going  Depending on the ADLs as she requires breaks especially with ADLs such as vacuuming and washing dishes per patient report 12/13/2015             Patient will benefit from skilled therapeutic intervention in order to improve the following deficits and impairments:  Pain, Decreased activity tolerance, Decreased strength, Postural dysfunction, Abnormal gait  Visit  Diagnosis: Pain in left thigh  Pain in right thigh  Chronic bilateral low back pain, with sciatica presence unspecified     Problem List Patient Active Problem List   Diagnosis Date Noted  . OSA on CPAP 07/27/2015  . Lip numbness 03/19/2015  . TIA (transient ischemic attack) 03/19/2015  . 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[place]) 02/06/2015  . Post op infection 02/05/2015  . History of lumbar laminectomy for spinal cord decompression 01/17/2015  . Lumbar spinal stenosis 01/17/2015  . Primary osteoarthritis of left knee 03/25/2014  . Rheumatoid arthritis of knee ([place]) 03/25/2014  . 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 DERMATITIS 09/28/2008  . CONSTIPATION 05/25/2008  . DYSPNEA ON EXERTION 05/25/2008  . ANEMIA, NORMOCYTIC 04/13/2008  . ANKLE PAIN, BILATERAL 11/25/2007  . [place] DISEASE 08/15/2006  . HLD (hyperlipidemia) 08/14/2006  . Morbid obesity ([place]) 12/13/2005  . CARPAL TUNNEL SYNDROME 12/13/2005  . PERIPHERAL NEUROPATHY 12/13/2005  . Essential hypertension 12/13/2005  . DYSRHYTHMIA, CARDIAC NOS 12/13/2005    Kaushal Vannice, [place] 12/22/2015, 1:19 PM  [facility] [address] [place], [place], [address] Phone: [phone]   Fax:  [phone]  Name: [name] MRN: [id number] Date of Birth: [birth year]

## 2015-12-27 ENCOUNTER — Encounter: Payer: Self-pay | Admitting: Physical Therapy

## 2015-12-27 ENCOUNTER — Ambulatory Visit: Payer: Medicare Other | Admitting: Physical Therapy

## 2015-12-27 DIAGNOSIS — M79652 Pain in left thigh: Secondary | ICD-10-CM | POA: Diagnosis not present

## 2015-12-27 DIAGNOSIS — M79651 Pain in right thigh: Secondary | ICD-10-CM

## 2015-12-27 DIAGNOSIS — G8929 Other chronic pain: Secondary | ICD-10-CM

## 2015-12-27 DIAGNOSIS — M545 Low back pain: Secondary | ICD-10-CM

## 2015-12-27 NOTE — Therapy (Signed)
[facility] [place], [place], [address] Phone: [phone]   Fax:  [phone]  Physical Therapy Treatment  Patient Details  Name: [name] MRN: [id number] Date of Birth: 1946-01-06 Referring Provider: Rodell Perna MD.  Encounter Date: 12/27/2015      PT End of Session - 12/27/15 1040    Visit Number 10   Number of Visits 16   Date for PT Re-Evaluation 01/22/16   PT Start Time [id number]   PT Stop Time 1129   PT Time Calculation (min) 60 min   Activity Tolerance Patient tolerated treatment well   Behavior During Therapy [facility] for tasks assessed/performed      Past Medical History:  Diagnosis Date  . Anemia   . Arthritis    Rheumatoid, followed by Dr. Estanislado Pandy, knees & ankles   . CHF (congestive heart failure) ([place])   . H/O cardiovascular stress test 02/2014   seen by Dr. Einar Gip - told that she was cleared for surgery  . Heart murmur   . Hypertension   . Shortness of breath dyspnea    with exertion  . Sleep apnea    CPAP in use- QO night, study done at [facility]- 2007     Past Surgical History:  Procedure Laterality Date  . ABDOMINAL HYSTERECTOMY    . CARPAL TUNNEL RELEASE Right   . FOOT SURGERY Bilateral    bunionectomy, calluses , also has several pins & screws  . HAND SURGERY    . JOINT REPLACEMENT     planned for 03/25/2014  . KNEE SURGERY  1990's   . LUMBAR LAMINECTOMY/DECOMPRESSION MICRODISCECTOMY N/A 01/17/2015   Procedure: L2-3, L3-4 Decompression;  Surgeon: [name], MD;  Location: [place];  Service: Orthopedics;  Laterality: N/A;  . LUMBAR LAMINECTOMY/DECOMPRESSION MICRODISCECTOMY N/A 02/06/2015   Procedure: I AND D LUMBAR WITH WOUND VAC PLACEMENT;  Surgeon: [name], MD;  Location: [place];  Service: Orthopedics;  Laterality: N/A;  . TOTAL KNEE ARTHROPLASTY Left 03/25/2014   Procedure: TOTAL KNEE ARTHROPLASTY;  Surgeon: Garald Balding, MD;  Location: [place];  Service: Orthopedics;  Laterality: Left;   . TUBAL LIGATION      There were no vitals filed for this visit.      Subjective Assessment - 12/27/15 1031    Subjective Reports that she has some soreness in R low back today and reports maybe some sinus problems beginning.   Limitations Walking   How long can you walk comfortably? Short distances.   Currently in Pain? Yes   Pain Score 4    Pain Location Back   Pain Orientation Right;Lower   Pain Descriptors / Indicators Sore   Pain Type Chronic pain   Pain Onset More than a month ago   Pain Frequency Intermittent   Aggravating Factors  With movement            [facility] PT Assessment - 12/27/15 0001      Assessment   Medical Diagnosis S/p lumbar decompression at L2-3; L3-4.   Onset Date/Surgical Date 01/17/15   Next MD Visit After MRI 12/28/2015 (Jan. 6 or 9)     Precautions   Precautions None     Restrictions   Weight Bearing Restrictions No                     [facility] Adult PT Treatment/Exercise - 12/27/15 0001      Lumbar Exercises: Aerobic   Stationary Bike NuStep L5 x20  min     Lumbar Exercises: Supine   Bent Knee Raise 20 reps   Bent Knee Raise Limitations BLE  Experienced discomfort in R low back     Modalities   Modalities Electrical Stimulation;Moist Heat     Moist Heat Therapy   Number Minutes Moist Heat 15 Minutes   Moist Heat Location Lumbar Spine     Electrical Stimulation   Electrical Stimulation Location R lumbar paraspinals   Electrical Stimulation Action Pre-Mod   Electrical Stimulation Parameters 80-150 hz x15 min   Electrical Stimulation Goals Pain     Manual Therapy   Manual Therapy Soft tissue mobilization   Soft tissue mobilization STW to R lumbar paraspinals in L sidelying to decrease pain and tightness                  PT Short Term Goals - 12/08/15 1112      PT SHORT TERM GOAL #1   Title Ind with initial HEP.   Time 2   Period Weeks   Status Achieved           PT Long Term Goals - 12/13/15  1128      PT LONG TERM GOAL #1   Title Ind with advanced HEP.   Time 8   Period Weeks   Status On-going     PT LONG TERM GOAL #2   Title Stand 20 minutes with pain not > 3-4/10.   Time 8   Period Weeks   Status Achieved  She can if she is holding to AD per patient report 12/13/2015     PT LONG TERM GOAL #3   Title Walk a community distance with pain not > 3-4/10.   Time 8   Period Weeks   Status On-going  Uses buggy or rolling cart at stores per patient report 12/13/2015     PT LONG TERM GOAL #4   Title Patient to subjectively report a 50% decrease in bilateral LE symptoms.   Time 8   Period Weeks   Status Achieved  Only occasional LE symptoms per patient report 12/13/2015     PT LONG TERM GOAL #5   Title Perform ADL's with pain not > 3-4/10.   Time 8   Period Weeks   Status On-going  Depending on the ADLs as she requires breaks especially with ADLs such as vacuuming and washing dishes per patient report 12/13/2015               Plan - 12/27/15 1112    Clinical Impression Statement Patient presented in clinic today with greater R low back pain with patient experiencing greater discomfort in R low back with supine marching exercises. Facial grimacing and reports of pain observed with supine marching. Tightness observed upon palpation of R lumbar paraspinals. Normal modalities response noted following removal of the modalities.   Rehab Potential Good   PT Frequency 2x / week   PT Duration 8 weeks   PT Treatment/Interventions ADLs/Self Care Home Management;Electrical Stimulation;Cryotherapy;Moist Heat;Ultrasound;Therapeutic activities;Therapeutic exercise;Patient/family education;Manual techniques   PT Next Visit Plan Continue with core strengthening with modalities and manual therapy PRN per mPT POC.      Consulted and Agree with Plan of Care Patient      Patient will benefit from skilled therapeutic intervention in order to improve the following deficits and  impairments:  Pain, Decreased activity tolerance, Decreased strength, Postural dysfunction, Abnormal gait  Visit Diagnosis: Pain in left thigh  Pain in right thigh  Chronic bilateral low back pain, with sciatica presence unspecified     Problem List Patient Active Problem List   Diagnosis Date Noted  . OSA on CPAP 07/27/2015  . Lip numbness 03/19/2015  . TIA (transient ischemic attack) 03/19/2015  . 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 Post op infection 02/05/2015  . History of lumbar laminectomy for spinal cord decompression 01/17/2015  . Lumbar spinal stenosis 01/17/2015  . Primary osteoarthritis of left knee 03/25/2014  . Rheumatoid arthritis of knee ([place]) 03/25/2014  . 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 DERMATITIS 09/28/2008  . CONSTIPATION 05/25/2008  . DYSPNEA ON EXERTION 05/25/2008  . ANEMIA, NORMOCYTIC 04/13/2008  . ANKLE PAIN, BILATERAL 11/25/2007  . [place] DISEASE 08/15/2006  . HLD (hyperlipidemia) 08/14/2006  . Morbid obesity ([place]) 12/13/2005  . CARPAL TUNNEL SYNDROME 12/13/2005  . PERIPHERAL NEUROPATHY 12/13/2005  . Essential hypertension 12/13/2005  . DYSRHYTHMIA, CARDIAC NOS 12/13/2005    Wynelle Fanny, PTA 12/27/2015, 11:31 AM  [facility] [address] [place], [place], [address] Phone: [phone]   Fax:  [phone]  Name: GEETIKA ECKELKAMP MRN: [id number] Date of Birth: [birth year]

## 2015-12-28 ENCOUNTER — Other Ambulatory Visit (HOSPITAL_COMMUNITY)
Admission: RE | Admit: 2015-12-28 | Discharge: 2015-12-28 | Disposition: A | Discharge status: Home / Self Care | Payer: Medicare Other | Source: Ambulatory Visit | Attending: Orthopaedic Surgery | Admitting: Orthopaedic Surgery

## 2015-12-28 ENCOUNTER — Ambulatory Visit (HOSPITAL_COMMUNITY)
Admission: RE | Admit: 2015-12-28 | Discharge: 2015-12-28 | Disposition: A | Discharge status: Home / Self Care | Payer: Medicare Other | Source: Ambulatory Visit | Attending: Orthopaedic Surgery | Admitting: Orthopaedic Surgery

## 2015-12-28 DIAGNOSIS — Z9889 Other specified postprocedural states: Secondary | ICD-10-CM | POA: Insufficient documentation

## 2015-12-28 DIAGNOSIS — M5136 Other intervertebral disc degeneration, lumbar region: Secondary | ICD-10-CM | POA: Diagnosis not present

## 2015-12-28 DIAGNOSIS — M4656 Other infective spondylopathies, lumbar region: Secondary | ICD-10-CM | POA: Diagnosis present

## 2015-12-28 DIAGNOSIS — M48061 Spinal stenosis, lumbar region without neurogenic claudication: Secondary | ICD-10-CM

## 2015-12-28 LAB — URINALYSIS, ROUTINE W REFLEX MICROSCOPIC
Bilirubin Urine: NEGATIVE
Glucose, UA: NEGATIVE mg/dL
Hgb urine dipstick: NEGATIVE
Ketones, ur: NEGATIVE mg/dL
Leukocytes, UA: NEGATIVE
Nitrite: NEGATIVE
Protein, ur: NEGATIVE mg/dL
Specific Gravity, Urine: 1.015 (ref 1.005–1.030)
pH: 7 (ref 5.0–8.0)

## 2015-12-28 LAB — CBC
HCT: 36.8 % (ref 36.0–46.0)
Hemoglobin: 11.5 g/dL — ABNORMAL LOW (ref 12.0–15.0)
MCH: 28.8 pg (ref 26.0–34.0)
MCHC: 31.3 g/dL (ref 30.0–36.0)
MCV: 92 fL (ref 78.0–100.0)
Platelets: 238 10*3/uL (ref 150–400)
RBC: 4 MIL/uL (ref 3.87–5.11)
RDW: 13.5 % (ref 11.5–15.5)
WBC: 4.9 10*3/uL (ref 4.0–10.5)

## 2015-12-28 MED ORDER — GADOBENATE DIMEGLUMINE 529 MG/ML IV SOLN
20.0000 mL | Freq: Once | INTRAVENOUS | Status: AC | PRN
  Administered 2015-12-28: 20 mL via INTRAVENOUS

## 2015-12-29 ENCOUNTER — Encounter: Payer: Medicare Other | Admitting: *Deleted

## 2016-01-05 ENCOUNTER — Ambulatory Visit: Payer: Medicare Other | Admitting: Physical Therapy

## 2016-01-05 ENCOUNTER — Encounter: Payer: Self-pay | Admitting: Physical Therapy

## 2016-01-05 DIAGNOSIS — M79652 Pain in left thigh: Secondary | ICD-10-CM

## 2016-01-05 DIAGNOSIS — G8929 Other chronic pain: Secondary | ICD-10-CM

## 2016-01-05 DIAGNOSIS — M545 Low back pain: Secondary | ICD-10-CM

## 2016-01-05 DIAGNOSIS — M79651 Pain in right thigh: Secondary | ICD-10-CM

## 2016-01-05 NOTE — Therapy (Signed)
[facility] [place], [place], [address] Phone: [phone]   Fax:  [phone]  Physical Therapy Treatment  Patient Details  Name: [name] MRN: [id number] Date of Birth: 01/16/1945 Referring Provider: Rodell Perna MD.  Encounter Date: 01/05/2016      PT End of Session - 01/05/16 1106    Visit Number 11   Number of Visits 16   Date for PT Re-Evaluation 01/22/16   PT Start Time [id number]   PT Stop Time 1121   PT Time Calculation (min) 52 min   Activity Tolerance Patient tolerated treatment well   Behavior During Therapy [facility] for tasks assessed/performed      Past Medical History:  Diagnosis Date  . Anemia   . Arthritis    Rheumatoid, followed by Dr. Estanislado Pandy, knees & ankles   . CHF (congestive heart failure) ([place])   . H/O cardiovascular stress test 02/2014   seen by Dr. Einar Gip - told that she was cleared for surgery  . Heart murmur   . Hypertension   . Shortness of breath dyspnea    with exertion  . Sleep apnea    CPAP in use- QO night, study done at [facility]- 2007     Past Surgical History:  Procedure Laterality Date  . ABDOMINAL HYSTERECTOMY    . CARPAL TUNNEL RELEASE Right   . FOOT SURGERY Bilateral    bunionectomy, calluses , also has several pins & screws  . HAND SURGERY    . JOINT REPLACEMENT     planned for 03/25/2014  . KNEE SURGERY  1990's   . LUMBAR LAMINECTOMY/DECOMPRESSION MICRODISCECTOMY N/A 01/17/2015   Procedure: L2-3, L3-4 Decompression;  Surgeon: [name], MD;  Location: [place];  Service: Orthopedics;  Laterality: N/A;  . LUMBAR LAMINECTOMY/DECOMPRESSION MICRODISCECTOMY N/A 02/06/2015   Procedure: I AND D LUMBAR WITH WOUND VAC PLACEMENT;  Surgeon: [name], MD;  Location: [place];  Service: Orthopedics;  Laterality: N/A;  . TOTAL KNEE ARTHROPLASTY Left 03/25/2014   Procedure: TOTAL KNEE ARTHROPLASTY;  Surgeon: Garald Balding, MD;  Location: [place];  Service: Orthopedics;  Laterality: Left;   . TUBAL LIGATION      There were no vitals filed for this visit.      Subjective Assessment - 01/05/16 1043    Subjective Patient reported feeling some better today with mild to moderate pain   Limitations Walking   How long can you walk comfortably? Short distances.   Currently in Pain? Yes   Pain Score 4    Pain Location Back   Pain Orientation Right;Lower   Pain Descriptors / Indicators Sore   Pain Type Chronic pain   Pain Onset More than a month ago   Pain Frequency Intermittent   Aggravating Factors  prolong activity or sitting    Pain Relieving Factors at rest                         [facility] Adult PT Treatment/Exercise - 01/05/16 0001      Lumbar Exercises: Aerobic   Stationary Bike NuStep L5 x20 min UE/LE, monitored for progression     Lumbar Exercises: Standing   Heel Raises Other (comment)  10 with core activation   Other Standing Lumbar Exercises marching with core activation 2x10     Lumbar Exercises: Seated   Sit to Stand Limitations seated scap retractions with red t-band 2x10, core focus     Lumbar Exercises: Supine  Ab Set 20 reps;5 seconds   Bent Knee Raise 20 reps;3 seconds   Other Supine Lumbar Exercises seated 2# reachouts and D1/D2 with core focus 2x10 each     Moist Heat Therapy   Number Minutes Moist Heat 15 Minutes   Moist Heat Location Lumbar Spine     Electrical Stimulation   Electrical Stimulation Location R lumbar paraspinals   Electrical Stimulation Action premod   Electrical Stimulation Parameters 80-150hz  x76min   Electrical Stimulation Goals Pain                  PT Short Term Goals - 12/08/15 1112      PT SHORT TERM GOAL #1   Title Ind with initial HEP.   Time 2   Period Weeks   Status Achieved           PT Long Term Goals - 12/13/15 1128      PT LONG TERM GOAL #1   Title Ind with advanced HEP.   Time 8   Period Weeks   Status On-going     PT LONG TERM GOAL #2   Title Stand 20  minutes with pain not > 3-4/10.   Time 8   Period Weeks   Status Achieved  She can if she is holding to AD per patient report 12/13/2015     PT LONG TERM GOAL #3   Title Walk a community distance with pain not > 3-4/10.   Time 8   Period Weeks   Status On-going  Uses buggy or rolling cart at stores per patient report 12/13/2015     PT LONG TERM GOAL #4   Title Patient to subjectively report a 50% decrease in bilateral LE symptoms.   Time 8   Period Weeks   Status Achieved  Only occasional LE symptoms per patient report 12/13/2015     PT LONG TERM GOAL #5   Title Perform ADL's with pain not > 3-4/10.   Time 8   Period Weeks   Status On-going  Depending on the ADLs as she requires breaks especially with ADLs such as vacuuming and washing dishes per patient report 12/13/2015               Plan - 01/05/16 1107    Clinical Impression Statement Patient tolerated treatment well today and was able to progress core strengthening to sitting and standing with no reported pain complaints. Patient has ongoing right low back and hip pain with prolong sitting or ADL's and activity. Patient unable to meet any current goals due to pain deficts.    Rehab Potential Good   PT Frequency 2x / week   PT Duration 8 weeks   PT Treatment/Interventions ADLs/Self Care Home Management;Electrical Stimulation;Cryotherapy;Moist Heat;Ultrasound;Therapeutic activities;Therapeutic exercise;Patient/family education;Manual techniques   PT Next Visit Plan Continue with core strengthening with modalities and manual therapy PRN per mPT POC.      Consulted and Agree with Plan of Care Patient      Patient will benefit from skilled therapeutic intervention in order to improve the following deficits and impairments:  Pain, Decreased activity tolerance, Decreased strength, Postural dysfunction, Abnormal gait  Visit Diagnosis: Pain in left thigh  Pain in right thigh  Chronic bilateral low back pain, with  sciatica presence unspecified     Problem List Patient Active Problem List   Diagnosis Date Noted  . OSA on CPAP 07/27/2015  . Lip numbness 03/19/2015  . TIA (transient ischemic attack) 03/19/2015  . Infection of  lumbar spine ([place]) 02/06/2015  . Post op infection 02/05/2015  . History of lumbar laminectomy for spinal cord decompression 01/17/2015  . Lumbar spinal stenosis 01/17/2015  . Primary osteoarthritis of left knee 03/25/2014  . Rheumatoid arthritis of knee ([place]) 03/25/2014  . 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 DERMATITIS 09/28/2008  . CONSTIPATION 05/25/2008  . DYSPNEA ON EXERTION 05/25/2008  . ANEMIA, NORMOCYTIC 04/13/2008  . ANKLE PAIN, BILATERAL 11/25/2007  . [place] DISEASE 08/15/2006  . HLD (hyperlipidemia) 08/14/2006  . Morbid obesity ([place]) 12/13/2005  . CARPAL TUNNEL SYNDROME 12/13/2005  . PERIPHERAL NEUROPATHY 12/13/2005  . Essential hypertension 12/13/2005  . DYSRHYTHMIA, CARDIAC NOS 12/13/2005    Phillips Climes, PTA 01/05/2016, 11:25 AM  [facility] [address] [place], [place], [address] Phone: [phone]   Fax:  [phone]  Name: SHATERICA KUSAK MRN: [id number] Date of Birth: 01-10-1945

## 2016-01-06 ENCOUNTER — Telehealth: Payer: Self-pay | Admitting: Family Medicine

## 2016-01-06 NOTE — Telephone Encounter (Signed)
error 

## 2016-01-10 ENCOUNTER — Encounter: Payer: Self-pay | Admitting: Physical Therapy

## 2016-01-10 ENCOUNTER — Ambulatory Visit: Payer: Medicare Other | Attending: Orthopaedic Surgery | Admitting: Physical Therapy

## 2016-01-10 DIAGNOSIS — M545 Low back pain: Secondary | ICD-10-CM | POA: Insufficient documentation

## 2016-01-10 DIAGNOSIS — M79651 Pain in right thigh: Secondary | ICD-10-CM

## 2016-01-10 DIAGNOSIS — M79652 Pain in left thigh: Secondary | ICD-10-CM | POA: Insufficient documentation

## 2016-01-10 DIAGNOSIS — G8929 Other chronic pain: Secondary | ICD-10-CM | POA: Diagnosis present

## 2016-01-10 NOTE — Therapy (Signed)
[facility] [place], [place], [address] Phone: [phone]   Fax:  [phone]  Physical Therapy Treatment  Patient Details  Name: [name] MRN: [id number] Date of Birth: 1945/06/12 Referring Provider: Rodell Perna MD.  Encounter Date: 01/10/2016      PT End of Session - 01/10/16 1347    Visit Number 12   Number of Visits 16   Date for PT Re-Evaluation 01/22/16   PT Start Time 9485   PT Stop Time 4627   PT Time Calculation (min) 51 min   Activity Tolerance Patient tolerated treatment well   Behavior During Therapy [facility] for tasks assessed/performed      Past Medical History:  Diagnosis Date  . Anemia   . Arthritis    Rheumatoid, followed by Dr. Estanislado Pandy, knees & ankles   . CHF (congestive heart failure) ([place])   . H/O cardiovascular stress test 02/2014   seen by Dr. Einar Gip - told that she was cleared for surgery  . Heart murmur   . Hypertension   . Shortness of breath dyspnea    with exertion  . Sleep apnea    CPAP in use- QO night, study done [facility]- 2007     Past Surgical History:  Procedure Laterality Date  . ABDOMINAL HYSTERECTOMY    . CARPAL TUNNEL RELEASE Right   . FOOT SURGERY Bilateral    bunionectomy, calluses , also has several pins & screws  . HAND SURGERY    . JOINT REPLACEMENT     planned for 03/25/2014  . KNEE SURGERY  1990's   . LUMBAR LAMINECTOMY/DECOMPRESSION MICRODISCECTOMY N/A 01/17/2015   Procedure: L2-3, L3-4 Decompression;  Surgeon: [name], MD;  Location: [place];  Service: Orthopedics;  Laterality: N/A;  . LUMBAR LAMINECTOMY/DECOMPRESSION MICRODISCECTOMY N/A 02/06/2015   Procedure: I AND D LUMBAR WITH WOUND VAC PLACEMENT;  Surgeon: [name], MD;  Location: [place];  Service: Orthopedics;  Laterality: N/A;  . TOTAL KNEE ARTHROPLASTY Left 03/25/2014   Procedure: TOTAL KNEE ARTHROPLASTY;  Surgeon: Garald Balding, MD;  Location: [place];  Service: Orthopedics;  Laterality: Left;   . TUBAL LIGATION      There were no vitals filed for this visit.      Subjective Assessment - 01/10/16 1348    Subjective Reports that her MRI did not reveal any infection and MD said to do test again with rheumatologist at next appointment.   Limitations Walking   How long can you walk comfortably? Short distances.   Currently in Pain? Yes   Pain Score 2    Pain Location Back   Pain Orientation Lower   Pain Descriptors / Indicators Discomfort   Pain Type Chronic pain   Pain Onset More than a month ago            [facility] PT Assessment - 01/10/16 0001      Assessment   Medical Diagnosis S/p lumbar decompression at L2-3; L3-4.   Onset Date/Surgical Date 01/17/15   Next MD Visit After MRI 12/28/2015 (Jan. 6 or 9)     Precautions   Precautions None     Restrictions   Weight Bearing Restrictions No                     [facility] Adult PT Treatment/Exercise - 01/10/16 0001      Lumbar Exercises: Aerobic   Stationary Bike Nustep L6 x15 min     Lumbar Exercises: Standing  Scapular Retraction Strengthening;Both;20 reps   Scapular Retraction Limitations Pink XTS   Shoulder Extension Strengthening;Both;20 reps   Shoulder Extension Limitations Pink XTS   Other Standing Lumbar Exercises B hip flexion with core activation x20 reps   Other Standing Lumbar Exercises Seated B D1/D2 with 2# ball and core activation x20 reps each; B tandem stance 2# ball reach x20 reps with core activation     Lumbar Exercises: Supine   Other Supine Lumbar Exercises Resisted leg press with core activation, red theraband x20 reps     Modalities   Modalities Electrical Stimulation;Moist Heat     Moist Heat Therapy   Number Minutes Moist Heat 15 Minutes   Moist Heat Location Lumbar Spine     Electrical Stimulation   Electrical Stimulation Location B lumbar paraspinals   Electrical Stimulation Action IFC   Electrical Stimulation Parameters 80-150 hz x15 min   Electrical Stimulation  Goals Pain                  PT Short Term Goals - 12/08/15 1112      PT SHORT TERM GOAL #1   Title Ind with initial HEP.   Time 2   Period Weeks   Status Achieved           PT Long Term Goals - 01/10/16 1421      PT LONG TERM GOAL #1   Title Ind with advanced HEP.   Time 8   Period Weeks   Status On-going     PT LONG TERM GOAL #2   Title Stand 20 minutes with pain not > 3-4/10.   Time 8   Period Weeks   Status Achieved  She can if she is holding to AD per patient report 12/13/2015     PT LONG TERM GOAL #3   Title Walk a community distance with pain not > 3-4/10.   Time 8   Period Weeks   Status Partially Met  At times per patient report with more erect stance per patient report 01/10/2016     PT LONG TERM GOAL #4   Title Patient to subjectively report a 50% decrease in bilateral LE symptoms.   Time 8   Period Weeks   Status Achieved  Only occasional LE symptoms per patient report 12/13/2015     PT LONG TERM GOAL #5   Title Perform ADL's with pain not > 3-4/10.   Time 8   Period Weeks   Status Achieved               Plan - 01/10/16 1429    Clinical Impression Statement Patient tolerated today's treatment well with progression of standing and seated core activation. Patient had no reports of any increased lumbar pain with any exercises just instability due to balance deficits. Patient continues to use AD in clinic and able to progress with goals with ability to achieve community ambulation and ADLs goals. Normal modalities response noted following removal of the modalities.    Rehab Potential Good   PT Frequency 2x / week   PT Duration 8 weeks   PT Treatment/Interventions ADLs/Self Care Home Management;Electrical Stimulation;Cryotherapy;Moist Heat;Ultrasound;Therapeutic activities;Therapeutic exercise;Patient/family education;Manual techniques   PT Next Visit Plan Continue with core strengthening with modalities and manual therapy PRN per mPT  POC.      Consulted and Agree with Plan of Care Patient      Patient will benefit from skilled therapeutic intervention in order to improve the following deficits and impairments:  Pain, Decreased activity tolerance, Decreased strength, Postural dysfunction, Abnormal gait  Visit Diagnosis: Pain in left thigh  Pain in right thigh  Chronic bilateral low back pain, with sciatica presence unspecified     Problem List Patient Active Problem List   Diagnosis Date Noted  . OSA on CPAP 07/27/2015  . Lip numbness 03/19/2015  . TIA (transient ischemic attack) 03/19/2015  . 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 Post op infection 02/05/2015  . History of lumbar laminectomy for spinal cord decompression 01/17/2015  . Lumbar spinal stenosis 01/17/2015  . Primary osteoarthritis of left knee 03/25/2014  . Rheumatoid arthritis of knee ([place]) 03/25/2014  . 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 DERMATITIS 09/28/2008  . CONSTIPATION 05/25/2008  . DYSPNEA ON EXERTION 05/25/2008  . ANEMIA, NORMOCYTIC 04/13/2008  . ANKLE PAIN, BILATERAL 11/25/2007  . [place] DISEASE 08/15/2006  . HLD (hyperlipidemia) 08/14/2006  . Morbid obesity ([place]) 12/13/2005  . CARPAL TUNNEL SYNDROME 12/13/2005  . PERIPHERAL NEUROPATHY 12/13/2005  . Essential hypertension 12/13/2005  . DYSRHYTHMIA, CARDIAC NOS 12/13/2005    Wynelle Fanny, PTA 01/10/2016, 3:17 PM  [facility] [address] [place], [place], [address] Phone: [phone]   Fax:  [phone]  Name: CORSICA FRANSON MRN: [id number] Date of Birth: Jul 29, 1945

## 2016-01-12 ENCOUNTER — Encounter: Payer: Self-pay | Admitting: Physical Therapy

## 2016-01-12 ENCOUNTER — Ambulatory Visit: Payer: Medicare Other | Admitting: Physical Therapy

## 2016-01-12 DIAGNOSIS — M545 Low back pain: Secondary | ICD-10-CM

## 2016-01-12 DIAGNOSIS — M79652 Pain in left thigh: Secondary | ICD-10-CM

## 2016-01-12 DIAGNOSIS — M79651 Pain in right thigh: Secondary | ICD-10-CM

## 2016-01-12 DIAGNOSIS — G8929 Other chronic pain: Secondary | ICD-10-CM

## 2016-01-12 NOTE — Patient Instructions (Signed)
Scapular Retraction: Bilateral   Facing anchor, pull arms back, bringing shoulder blades together. Repeat _30___ times per set. Do __1-2__ sets per session. Do _1-2___ sessions per day.  Pelvic Tilt: Posterior - Legs Bent (Supine)  Straight Leg Raise  Tighten stomach and slowly raise locked right leg __4__ inches from floor. Repeat __10-30__ times per set. Do __2__ sets per session. Do __2__ sessions per day.     Scapular Retraction: Bilateral  Facing anchor, pull arms back, bringing shoulder blades together. Repeat _30___ times per set. Do __2-3__ sets per session. Do _2___ sessions per day.

## 2016-01-12 NOTE — Therapy (Addendum)
[facility] [place], [place], [address] Phone: [phone]   Fax:  [phone]  Physical Therapy Treatment  Patient Details  Name: [name] MRN: [id number] Date of Birth: 03/06/1945 Referring Provider: Rodell Perna MD.  Encounter Date: 01/12/2016      PT End of Session - 01/12/16 0925    Visit Number 13   Number of Visits 16   Date for PT Re-Evaluation 01/22/16   PT Start Time 0902   PT Stop Time 0945   PT Time Calculation (min) 43 min   Activity Tolerance Patient tolerated treatment well   Behavior During Therapy [facility] for tasks assessed/performed      Past Medical History:  Diagnosis Date  . Anemia   . Arthritis    Rheumatoid, followed by Dr. Estanislado Pandy, knees & ankles   . CHF (congestive heart failure) ([place])   . H/O cardiovascular stress test 02/2014   seen by Dr. Einar Gip - told that she was cleared for surgery  . Heart murmur   . Hypertension   . Shortness of breath dyspnea    with exertion  . Sleep apnea    CPAP in use- QO night, study done at [facility]- 2007     Past Surgical History:  Procedure Laterality Date  . ABDOMINAL HYSTERECTOMY    . CARPAL TUNNEL RELEASE Right   . FOOT SURGERY Bilateral    bunionectomy, calluses , also has several pins & screws  . HAND SURGERY    . JOINT REPLACEMENT     planned for 03/25/2014  . KNEE SURGERY  1990's   . LUMBAR LAMINECTOMY/DECOMPRESSION MICRODISCECTOMY N/A 01/17/2015   Procedure: L2-3, L3-4 Decompression;  Surgeon: [name], MD;  Location: [place];  Service: Orthopedics;  Laterality: N/A;  . LUMBAR LAMINECTOMY/DECOMPRESSION MICRODISCECTOMY N/A 02/06/2015   Procedure: I AND D LUMBAR WITH WOUND VAC PLACEMENT;  Surgeon: [name], MD;  Location: [place];  Service: Orthopedics;  Laterality: N/A;  . TOTAL KNEE ARTHROPLASTY Left 03/25/2014   Procedure: TOTAL KNEE ARTHROPLASTY;  Surgeon: Garald Balding, MD;  Location: [place];  Service: Orthopedics;  Laterality: Left;   . TUBAL LIGATION      There were no vitals filed for this visit.      Subjective Assessment - 01/12/16 0909    Subjective Patient arrived with ongoing symptoms yet improving overall   Limitations Walking   How long can you walk comfortably? Short distances.   Currently in Pain? Yes   Pain Score 2    Pain Location Back   Pain Orientation Lower   Pain Descriptors / Indicators Discomfort   Pain Type Chronic pain   Pain Onset More than a month ago   Pain Frequency Intermittent   Aggravating Factors  prolong activity   Pain Relieving Factors at rest                         [facility] Adult PT Treatment/Exercise - 01/12/16 0001      Lumbar Exercises: Aerobic   Stationary Bike Nustep L6 x20 min     Lumbar Exercises: Standing   Scapular Retraction Strengthening;Both;20 reps   Scapular Retraction Limitations Pink XTS   Shoulder Extension Strengthening;Both;20 reps   Shoulder Extension Limitations pink XTS     Lumbar Exercises: Supine   Other Supine Lumbar Exercises seated 2# D1/D2  and reach outs with core focus 2x10     Moist Heat Therapy   Number Minutes Moist  Heat 15 Minutes   Moist Heat Location Lumbar Spine     Electrical Stimulation   Electrical Stimulation Location B lumbar paraspinals   Electrical Stimulation Action IFC   Electrical Stimulation Parameters 80-150hz  x40mn   Electrical Stimulation Goals Pain                PT Education - 01/12/16 0935    Education provided Yes   Education Details HEP   Person(s) Educated Patient   Methods Explanation;Demonstration;Handout   Comprehension Verbalized understanding;Returned demonstration          PT Short Term Goals - 12/08/15 1112      PT SHORT TERM GOAL #1   Title Ind with initial HEP.   Time 2   Period Weeks   Status Achieved           PT Long Term Goals - 01/12/16 02836     PT LONG TERM GOAL #1   Title Ind with advanced HEP.   Time 8   Period Weeks   Status Achieved      PT LONG TERM GOAL #2   Title Stand 20 minutes with pain not > 3-4/10.   Time 8   Period Weeks   Status Achieved     PT LONG TERM GOAL #3   Title Walk a community distance with pain not > 3-4/10.   Time 8   Period Weeks   Status On-going  pain up to 8+/10 01/12/16     PT LONG TERM GOAL #4   Title Patient to subjectively report a 50% decrease in bilateral LE symptoms.   Time 8   Period Weeks   Status Achieved     PT LONG TERM GOAL #5   Title Perform ADL's with pain not > 3-4/10.   Time 8   Period Weeks   Status Achieved               Plan - 01/12/16 0936    Clinical Impression Statement Patient tolerated treatment well today. Patient needed to leave early due to another appt. Patient was given HEP for advanced core strengthening. Patient has met all but one goal due to ongoing pain. Patient has reported 70% improvement overall. Patient is unable to walk a community distance due to pain level increasing with distance. Patient going to MD and will cont per his discresion.   Rehab Potential Good   PT Frequency 2x / week   PT Duration 8 weeks   PT Treatment/Interventions ADLs/Self Care Home Management;Electrical Stimulation;Cryotherapy;Moist Heat;Ultrasound;Therapeutic activities;Therapeutic exercise;Patient/family education;Manual techniques   PT Next Visit Plan MD note sent to [place] to determine POC   Consulted and Agree with Plan of Care Patient      Patient will benefit from skilled therapeutic intervention in order to improve the following deficits and impairments:  Pain, Decreased activity tolerance, Decreased strength, Postural dysfunction, Abnormal gait  Visit Diagnosis: Pain in left thigh  Pain in right thigh  Chronic bilateral low back pain, with sciatica presence unspecified     Problem List Patient Active Problem List   Diagnosis Date Noted  . OSA on CPAP 07/27/2015  . Lip numbness 03/19/2015  . TIA (transient ischemic attack) 03/19/2015  .  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[place] 02/06/2015  . Post op infection 02/05/2015  . History of lumbar laminectomy for spinal cord decompression 01/17/2015  . Lumbar spinal stenosis 01/17/2015  . Primary osteoarthritis of left knee 03/25/2014  . Rheumatoid arthritis of knee ([place] 03/25/2014  . S/P  total knee replacement using cement 03/25/2014  . DERMATITIS 09/28/2008  . CONSTIPATION 05/25/2008  . DYSPNEA ON EXERTION 05/25/2008  . ANEMIA, NORMOCYTIC 04/13/2008  . ANKLE PAIN, BILATERAL 11/25/2007  . [place] DISEASE 08/15/2006  . HLD (hyperlipidemia) 08/14/2006  . Morbid obesity ([place]) 12/13/2005  . CARPAL TUNNEL SYNDROME 12/13/2005  . PERIPHERAL NEUROPATHY 12/13/2005  . Essential hypertension 12/13/2005  . DYSRHYTHMIA, CARDIAC NOS 12/13/2005    Ladean Raya, PTA 01/12/16 10:25 AM Mali Applegate MPT [facility] [address] [place], [place], [address] Phone: [phone]   Fax:  [phone]  Name: [name] MRN: [id number] Date of Birth: 23-Dec-1945 PHYSICAL THERAPY DISCHARGE SUMMARY  Visits from Start of Care: 8.  Current functional level related to goals / functional outcomes: See above.   Remaining deficits: Continued pain.   Education / Equipment: HEP Plan: Patient agrees to discharge.  Patient goals were not met. Patient is being discharged due to not returning since the last visit.  ?????        Mali Applegate MPT

## 2016-01-17 ENCOUNTER — Ambulatory Visit (INDEPENDENT_AMBULATORY_CARE_PROVIDER_SITE_OTHER): Payer: Medicare Other | Admitting: Orthopaedic Surgery

## 2016-01-18 ENCOUNTER — Ambulatory Visit (INDEPENDENT_AMBULATORY_CARE_PROVIDER_SITE_OTHER): Payer: Medicare Other | Admitting: Orthopaedic Surgery

## 2016-01-18 ENCOUNTER — Encounter (INDEPENDENT_AMBULATORY_CARE_PROVIDER_SITE_OTHER): Payer: Self-pay | Admitting: Orthopaedic Surgery

## 2016-01-18 VITALS — BP 150/83 | HR 78 | Ht 61.0 in | Wt 216.0 lb

## 2016-01-18 DIAGNOSIS — M869 Osteomyelitis, unspecified: Secondary | ICD-10-CM | POA: Diagnosis not present

## 2016-01-18 DIAGNOSIS — M4626 Osteomyelitis of vertebra, lumbar region: Secondary | ICD-10-CM

## 2016-01-18 NOTE — Progress Notes (Signed)
Office Visit Note   Patient: [name]           Date of Birth: October 12, 1945           MRN: [id number] Visit Date: 01/18/2016              Requested by: Iona Beard, MD [address] [place], [place] [address] PCP: Maggie Font, MD   Assessment & Plan: Visit Diagnoses:  1. 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[place])     Plan: Patient's incision is completely healed. She did have increase in her sedimentation rate and CRP but no increase in pain. MRI scan showed no evidence of persistent infection. She is walking better and has noted relief of pain from her decompression surgery and is happy with the surgical result. She'll continue to walk and gradual increase her distance to help with the weight loss and cardiovascular fitness. Office follow-up when necessary.  Follow-Up Instructions: Return if symptoms worsen or fail to improve.   Orders:  No orders of the defined types were placed in this encounter.  No orders of the defined types were placed in this encounter.     Procedures: No procedures performed   Clinical Data: No additional findings.   Subjective: Chief Complaint  Patient presents with  . Lower Back - Follow-up    Patient returns for one month follow up lumbar spine. She has continued outpatient therapy and states her leg is getting stronger. She is doing well.    Review of Systems  Constitutional: Negative for chills and diaphoresis.       No chills or fever. She denies bowel or bladder symptoms.  HENT: Negative for ear discharge, ear pain and nosebleeds.   Eyes: Negative for discharge and visual disturbance.  Respiratory: Negative for cough, choking and shortness of breath.   Cardiovascular: Negative for chest pain and palpitations.  Gastrointestinal: Negative for abdominal distention and abdominal pain.  Endocrine: Negative for cold intolerance and heat intolerance.  Genitourinary: Negative for flank pain and hematuria.  Musculoskeletal:   Patient's walking better post lumbar decompression. She had postoperative infection requiring VAC for several weeks until it gradually healed. She had been on immunosuppressive agents for several months but they were stopped a few weeks prior to her surgical procedure. She denies any drainage from her back and is increasing her ambulation the community.  Skin: Negative for rash and wound.  Neurological: Negative for seizures and speech difficulty.  Hematological: Negative for adenopathy. Does not bruise/bleed easily.  Psychiatric/Behavioral: Negative for agitation and suicidal ideas.     Objective: Vital Signs: BP (!) 150/83   Pulse 78   Ht 5\' 1"  (1.549 m)   Wt 216 lb (98 kg)   BMI 40.81 kg/m   Physical Exam  Constitutional: She is oriented to person, place, and time. She appears well-developed.  HENT:  Head: Normocephalic.  Right Ear: External ear normal.  Left Ear: External ear normal.  Eyes: Pupils are equal, round, and reactive to light.  Neck: No tracheal deviation present. No thyromegaly present.  Cardiovascular: Normal rate.   Pulmonary/Chest: Effort normal.  Abdominal: Soft.  Musculoskeletal:  Patient has good hip range of motion quad strength is good.  Neurological: She is alert and oriented to person, place, and time.  Skin: Skin is warm and dry.  Psychiatric: She has a normal mood and affect. Her behavior is normal.    Ortho Exam lumbar incision is completely healed. Anterior tib EHL gastrocsoleus is strong.  Total knee arthroplasty is well healed no synovitis of the knee. She is a mature with a single-point cane and now she frequently leaves the cane and does not need to use it.  Specialty Comments:  No specialty comments available.  Imaging: No results found.   PMFS History: Patient Active Problem List   Diagnosis Date Noted  . OSA on CPAP 07/27/2015  . Lip numbness 03/19/2015  . TIA (transient ischemic attack) 03/19/2015  . 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[place])  02/06/2015  . Post op infection 02/05/2015  . History of lumbar laminectomy for spinal cord decompression 01/17/2015  . Lumbar spinal stenosis 01/17/2015  . Primary osteoarthritis of left knee 03/25/2014  . Rheumatoid arthritis of knee ([place]) 03/25/2014  . 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 DERMATITIS 09/28/2008  . CONSTIPATION 05/25/2008  . DYSPNEA ON EXERTION 05/25/2008  . ANEMIA, NORMOCYTIC 04/13/2008  . ANKLE PAIN, BILATERAL 11/25/2007  . [place] DISEASE 08/15/2006  . HLD (hyperlipidemia) 08/14/2006  . Morbid obesity ([place]) 12/13/2005  . CARPAL TUNNEL SYNDROME 12/13/2005  . PERIPHERAL NEUROPATHY 12/13/2005  . Essential hypertension 12/13/2005  . DYSRHYTHMIA, CARDIAC NOS 12/13/2005   Past Medical History:  Diagnosis Date  . Anemia   . Arthritis    Rheumatoid, followed by Dr. Estanislado Pandy, knees & ankles   . CHF (congestive heart failure) ([place])   . H/O cardiovascular stress test 02/2014   seen by Dr. Einar Gip - told that she was cleared for surgery  . Heart murmur   . Hypertension   . Shortness of breath dyspnea    with exertion  . Sleep apnea    CPAP in use- QO night, [facility]- [facility]     Family History  Problem Relation Age of Onset  . Seizures Mother   . Transient ischemic attack Mother   . Heart disease Father     CHF  . Stroke Sister     Past Surgical History:  Procedure Laterality Date  . ABDOMINAL HYSTERECTOMY    . CARPAL TUNNEL RELEASE Right   . FOOT SURGERY Bilateral    bunionectomy, calluses , also has several pins & screws  . HAND SURGERY    . JOINT REPLACEMENT     planned for 03/25/2014  . KNEE SURGERY  1990's   . LUMBAR LAMINECTOMY/DECOMPRESSION MICRODISCECTOMY N/A 01/17/2015   Procedure: L2-3, L3-4 Decompression;  Surgeon: [name], MD;  Location: [place];  Service: Orthopedics;  Laterality: N/A;  . LUMBAR LAMINECTOMY/DECOMPRESSION MICRODISCECTOMY N/A 02/06/2015   Procedure: I AND D LUMBAR WITH WOUND VAC PLACEMENT;   Surgeon: [name], MD;  Location: [place];  Service: Orthopedics;  Laterality: N/A;  . TOTAL KNEE ARTHROPLASTY Left 03/25/2014   Procedure: TOTAL KNEE ARTHROPLASTY;  Surgeon: Garald Balding, MD;  Location: [place];  Service: Orthopedics;  Laterality: Left;  . TUBAL LIGATION     Social History   Occupational History  . Not on file.   Social History Main Topics  . Smoking status: Former Smoker    Quit date: 03/16/1980  . Smokeless tobacco: Never Used  . Alcohol use No  . Drug use: No  . Sexual activity: Not on file

## 2016-01-19 ENCOUNTER — Telehealth: Payer: Self-pay

## 2016-01-19 NOTE — Telephone Encounter (Signed)
Spoke with Collie Siad, the representative from [employer] regarding patients Humira prior authorization. The medication was approved from 01-16-16 to 01-07-17.   Reference number: [id number]  Spoke with patient who voiced understanding.   Will scan document when it's received.   Wilkin Lippy, [place], CPhT

## 2016-01-27 ENCOUNTER — Ambulatory Visit: Payer: Medicare Other | Admitting: Nurse Practitioner

## 2016-02-08 ENCOUNTER — Other Ambulatory Visit: Payer: Self-pay | Admitting: Rheumatology

## 2016-02-08 NOTE — Telephone Encounter (Signed)
Last Visit: 10/12/15 Next Visit: 03/19/16 UDS: 07/01/15 Narc Agreement: 07/08/15   Okay to refill Tramadol?  

## 2016-02-08 NOTE — Telephone Encounter (Signed)
ok 

## 2016-02-21 ENCOUNTER — Other Ambulatory Visit: Payer: Self-pay | Admitting: Rheumatology

## 2016-02-21 LAB — CBC WITH DIFFERENTIAL/PLATELET
Basophils Absolute: 65 cells/uL (ref 0–200)
Basophils Relative: 1 %
Eosinophils Absolute: 195 cells/uL (ref 15–500)
Eosinophils Relative: 3 %
HCT: 35.1 % (ref 35.0–45.0)
Hemoglobin: 11.1 g/dL — ABNORMAL LOW (ref 11.7–15.5)
Lymphocytes Relative: 26 %
Lymphs Abs: 1690 cells/uL (ref 850–3900)
MCH: 28.2 pg (ref 27.0–33.0)
MCHC: 31.6 g/dL — ABNORMAL LOW (ref 32.0–36.0)
MCV: 89.3 fL (ref 80.0–100.0)
MPV: 10.9 fL (ref 7.5–12.5)
Monocytes Absolute: 390 cells/uL (ref 200–950)
Monocytes Relative: 6 %
Neutro Abs: 4160 cells/uL (ref 1500–7800)
Neutrophils Relative %: 64 %
Platelets: 266 10*3/uL (ref 140–400)
RBC: 3.93 MIL/uL (ref 3.80–5.10)
RDW: 13.4 % (ref 11.0–15.0)
WBC: 6.5 10*3/uL (ref 3.8–10.8)

## 2016-02-21 LAB — COMPLETE METABOLIC PANEL WITH GFR
ALT: 10 U/L (ref 6–29)
AST: 15 U/L (ref 10–35)
Albumin: 3.6 g/dL (ref 3.6–5.1)
Alkaline Phosphatase: 107 U/L (ref 33–130)
BUN: 21 mg/dL (ref 7–25)
CO2: 25 mmol/L (ref 20–31)
Calcium: 8.9 mg/dL (ref 8.6–10.4)
Chloride: 106 mmol/L (ref 98–110)
Creat: 1.18 mg/dL — ABNORMAL HIGH (ref 0.60–0.93)
GFR, Est African American: 54 mL/min — ABNORMAL LOW (ref >=60)
GFR, Est Non African American: 47 mL/min — ABNORMAL LOW (ref >=60)
Glucose, Bld: 88 mg/dL (ref 65–99)
Potassium: 4.3 mmol/L (ref 3.5–5.3)
Sodium: 141 mmol/L (ref 135–146)
Total Bilirubin: 0.4 mg/dL (ref 0.2–1.2)
Total Protein: 7 g/dL (ref 6.1–8.1)

## 2016-03-09 ENCOUNTER — Other Ambulatory Visit: Payer: Self-pay | Admitting: Rheumatology

## 2016-03-09 NOTE — Progress Notes (Deleted)
Office Visit Note  Patient: [name]             Date of Birth: [birth year]           MRN: [id number]             PCP: Maggie Font, MD Referring: Iona Beard, MD Visit Date: 03/19/2016 Occupation: @GUAROCC @    Subjective:  No chief complaint on file.   History of Present Illness: [name] is a 71 y.o. female ***   Activities of Daily Living:  Patient reports morning stiffness for *** {minute/hour:19697}.   Patient {ACTIONS;DENIES/REPORTS:21021675::"Denies"} nocturnal pain.  Difficulty dressing/grooming: {ACTIONS;DENIES/REPORTS:21021675::"Denies"} Difficulty climbing stairs: {ACTIONS;DENIES/REPORTS:21021675::"Denies"} Difficulty getting out of chair: {ACTIONS;DENIES/REPORTS:21021675::"Denies"} Difficulty using hands for taps, buttons, cutlery, and/or writing: {ACTIONS;DENIES/REPORTS:21021675::"Denies"}   No Rheumatology ROS completed.   PMFS History:  Patient Active Problem List   Diagnosis Date Noted  . OSA on CPAP 07/27/2015  . Lip numbness 03/19/2015  . TIA (transient ischemic attack) 03/19/2015  . 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 Post op infection 02/05/2015  . History of lumbar laminectomy for spinal cord decompression 01/17/2015  . Lumbar spinal stenosis 01/17/2015  . Primary osteoarthritis of left knee 03/25/2014  . Rheumatoid arthritis of knee ([place]) 03/25/2014  . 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 DERMATITIS 09/28/2008  . CONSTIPATION 05/25/2008  . DYSPNEA ON EXERTION 05/25/2008  . ANEMIA, NORMOCYTIC 04/13/2008  . ANKLE PAIN, BILATERAL 11/25/2007  . [place] DISEASE 08/15/2006  . HLD (hyperlipidemia) 08/14/2006  . Morbid obesity ([place]) 12/13/2005  . CARPAL TUNNEL SYNDROME 12/13/2005  . PERIPHERAL NEUROPATHY 12/13/2005  . Essential hypertension 12/13/2005  . DYSRHYTHMIA, CARDIAC NOS 12/13/2005    Past Medical History:  Diagnosis Date  . Anemia   . Arthritis    Rheumatoid, followed by Dr.  Estanislado Pandy, knees & ankles   . CHF (congestive heart failure) ([place])   . H/O cardiovascular stress test 02/2014   seen by Dr. Einar Gip - told that she was cleared for surgery  . Heart murmur   . Hypertension   . Shortness of breath dyspnea    with exertion  . Sleep apnea    CPAP in use- QO night, [facility] done at [facility]- 2007     Family History  Problem Relation Age of Onset  . Seizures Mother   . Transient ischemic attack Mother   . Heart disease Father     CHF  . Stroke Sister    Past Surgical History:  Procedure Laterality Date  . ABDOMINAL HYSTERECTOMY    . CARPAL TUNNEL RELEASE Right   . FOOT SURGERY Bilateral    bunionectomy, calluses , also has several pins & screws  . HAND SURGERY    . JOINT REPLACEMENT     planned for 03/25/2014  . KNEE SURGERY  1990's   . LUMBAR LAMINECTOMY/DECOMPRESSION MICRODISCECTOMY N/A 01/17/2015   Procedure: L2-3, L3-4 Decompression;  Surgeon: [name], MD;  Location: [place];  Service: Orthopedics;  Laterality: N/A;  . LUMBAR LAMINECTOMY/DECOMPRESSION MICRODISCECTOMY N/A 02/06/2015   Procedure: I AND D LUMBAR WITH WOUND VAC PLACEMENT;  Surgeon: [name], MD;  Location: [place];  Service: Orthopedics;  Laterality: N/A;  . TOTAL KNEE ARTHROPLASTY Left 03/25/2014   Procedure: TOTAL KNEE ARTHROPLASTY;  Surgeon: Garald Balding, MD;  Location: [place];  Service: Orthopedics;  Laterality: Left;  . TUBAL LIGATION     Social History   Social History Narrative   Lives with husband.  2 Sons.  Using walker.    Caffeine  Less then 1 cups daily.   Retired.       Objective: Vital Signs: There were no vitals taken for this visit.   Physical Exam   Musculoskeletal Exam: ***  CDAI Exam: No CDAI exam completed.    Investigation: Findings:  August 29, 2015:  CBC was normal.  Comprehensive metabolic panel showed creatinine of 1.04, GFR 63.  10/12/2015  Her RAPID3 score today was 3, which was under moderate severity, although she had no synovitis.   X-ray of  right elbow joint, 2 views today, showed joint space was normal.  There was questionable erosion over the medial epicondyle, which could be a cyst or erosion, which was old.  She has olecranon spur.  Right hip joint x-ray, 2 views, showed mild inferomedial joint narrowing.  No erosive changes.  No chondrocalcinosis consistent with early osteoarthritis.   07/01/2015 negative TB gold   Her labs from October 19th 2010 showed sed rate of 46 and 49 at two different times.  Uric acid was 7.4 which was slightly elevated.  CK, rheumatoid factor, anti-CCP, ANA, ACE level, and HLAB-27 were negative.  According to the patient, she also had a PPD in September which was negative.     Orders Only on 02/21/2016  Component Date Value Ref Range Status  . Sodium 02/21/2016 141  135 - 146 mmol/L Final  . Potassium 02/21/2016 4.3  3.5 - 5.3 mmol/L Final  . Chloride 02/21/2016 106  98 - 110 mmol/L Final  . CO2 02/21/2016 25  20 - 31 mmol/L Final  . Glucose, Bld 02/21/2016 88  65 - 99 mg/dL Final  . BUN 02/21/2016 21  7 - 25 mg/dL Final  . Creat 02/21/2016 1.18* 0.60 - 0.93 mg/dL Final   Comment:   For patients > or = 71 years of age: The upper reference limit for Creatinine is approximately 13% higher for people identified as African-American.     . Total Bilirubin 02/21/2016 0.4  0.2 - 1.2 mg/dL Final  . Alkaline Phosphatase 02/21/2016 107  33 - 130 U/L Final  . AST 02/21/2016 15  10 - 35 U/L Final  . ALT 02/21/2016 10  6 - 29 U/L Final  . Total Protein 02/21/2016 7.0  6.1 - 8.1 g/dL Final  . Albumin 02/21/2016 3.6  3.6 - 5.1 g/dL Final  . Calcium 02/21/2016 8.9  8.6 - 10.4 mg/dL Final  . GFR, Est African American 02/21/2016 54* >=60 mL/min Final  . GFR, Est Non African American 02/21/2016 47* >=60 mL/min Final  . WBC 02/21/2016 6.5  3.8 - 10.8 K/uL Final  . RBC 02/21/2016 3.93  3.80 - 5.10 MIL/uL Final  . Hemoglobin 02/21/2016 11.1* 11.7 - 15.5 g/dL Final  . HCT 02/21/2016 35.1  35.0 - 45.0 % Final    . MCV 02/21/2016 89.3  80.0 - 100.0 fL Final  . MCH 02/21/2016 28.2  27.0 - 33.0 pg Final  . MCHC 02/21/2016 31.6* 32.0 - 36.0 g/dL Final  . RDW 02/21/2016 13.4  11.0 - 15.0 % Final  . Platelets 02/21/2016 266  140 - 400 K/uL Final  . MPV 02/21/2016 10.9  7.5 - 12.5 fL Final  . Neutro Abs 02/21/2016 4160  1,500 - 7,800 cells/uL Final  . Lymphs Abs 02/21/2016 1690  850 - 3,900 cells/uL Final  . Monocytes Absolute 02/21/2016 390  200 - 950 cells/uL Final  . Eosinophils Absolute 02/21/2016 195  15 - 500  cells/uL Final  . Basophils Absolute 02/21/2016 65  0 - 200 cells/uL Final  . Neutrophils Relative % 02/21/2016 64  % Final  . Lymphocytes Relative 02/21/2016 26  % Final  . Monocytes Relative 02/21/2016 6  % Final  . Eosinophils Relative 02/21/2016 3  % Final  . Basophils Relative 02/21/2016 1  % Final  . Smear Review 02/21/2016 Criteria for review not met   Final  Hospital Outpatient Visit on 12/28/2015  Component Date Value Ref Range Status  . WBC 12/28/2015 4.9  4.0 - 10.5 K/uL Final  . RBC 12/28/2015 4.00  3.87 - 5.11 MIL/uL Final  . Hemoglobin 12/28/2015 11.5* 12.0 - 15.0 g/dL Final  . HCT 12/28/2015 36.8  36.0 - 46.0 % Final  . MCV 12/28/2015 92.0  78.0 - 100.0 fL Final  . MCH 12/28/2015 28.8  26.0 - 34.0 pg Final  . MCHC 12/28/2015 31.3  30.0 - 36.0 g/dL Final  . RDW 12/28/2015 13.5  11.5 - 15.5 % Final  . Platelets 12/28/2015 238  150 - 400 K/uL Final  . Color, Urine 12/28/2015 YELLOW  YELLOW Final  . APPearance 12/28/2015 CLEAR  CLEAR Final  . Specific Gravity, Urine 12/28/2015 1.015  1.005 - 1.030 Final  . pH 12/28/2015 7.0  5.0 - 8.0 Final  . Glucose, UA 12/28/2015 NEGATIVE  NEGATIVE mg/dL Final  . Hgb urine dipstick 12/28/2015 NEGATIVE  NEGATIVE Final  . Bilirubin Urine 12/28/2015 NEGATIVE  NEGATIVE Final  . Ketones, ur 12/28/2015 NEGATIVE  NEGATIVE mg/dL Final  . Protein, ur 12/28/2015 NEGATIVE  NEGATIVE mg/dL Final  . Nitrite 12/28/2015 NEGATIVE  NEGATIVE Final  .  Leukocytes, UA 12/28/2015 NEGATIVE  NEGATIVE Final     Imaging: No results found.  Speciality Comments: No specialty comments available.    Procedures:  No procedures performed Allergies: Patient has no known allergies.   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  No orders of the defined types were placed in this encounter.   Face-to-face time spent with patient was *** minutes. 50% of time was spent in counseling and coordination of care.  Follow-Up Instructions: No Follow-up on file.   Amy Littrell, RT  Note - This record has been created using [employer].  Chart creation errors have been sought, but may not always  have been located. Such creation errors do not reflect on  the standard of medical care.

## 2016-03-12 NOTE — Telephone Encounter (Signed)
ok 

## 2016-03-12 NOTE — Telephone Encounter (Signed)
Last Visit: 10/12/15 Next Visit: 03/19/16 Labs: 02/21/16 Creat. 1.18 GFR 54 Previous Creat 1.38 GFR 45 TB Gold : 07/01/15 Neg  Okay to refill Humira?

## 2016-03-16 DIAGNOSIS — M170 Bilateral primary osteoarthritis of knee: Secondary | ICD-10-CM | POA: Insufficient documentation

## 2016-03-16 DIAGNOSIS — M5136 Other intervertebral disc degeneration, lumbar region: Secondary | ICD-10-CM | POA: Insufficient documentation

## 2016-03-16 DIAGNOSIS — M19071 Primary osteoarthritis, right ankle and foot: Secondary | ICD-10-CM | POA: Insufficient documentation

## 2016-03-16 DIAGNOSIS — M19072 Primary osteoarthritis, left ankle and foot: Secondary | ICD-10-CM

## 2016-03-16 DIAGNOSIS — Z79899 Other long term (current) drug therapy: Secondary | ICD-10-CM | POA: Insufficient documentation

## 2016-03-19 ENCOUNTER — Ambulatory Visit: Payer: Medicare Other | Admitting: Rheumatology

## 2016-03-27 ENCOUNTER — Other Ambulatory Visit: Payer: Self-pay | Admitting: Rheumatology

## 2016-03-27 IMAGING — CT CT ANGIO HEAD
1 of 9 series · 1 of 33 positions shown · IV contrast (omnipaque)
Comparison: MRI of the brain Thursday March, 2015 at 5646 hours

CLINICAL DATA: Follow-up stroke.  History of hypertension.

EXAM:
CT ANGIOGRAPHY HEAD AND NECK
TECHNIQUE: Multidetector CT imaging of the head and neck was performed using
the standard protocol during bolus administration of intravenous
contrast. Multiplanar CT image reconstructions and MIPs were
obtained to evaluate the vascular anatomy. Carotid stenosis
measurements (when applicable) are obtained utilizing NASCET
criteria, using the distal internal carotid diameter as the
denominator.
CONTRAST:  100mL OMNIPAQUE IOHEXOL 350 MG/ML SOLN

[Series 200: locator · axial · 0.49mm/px · 1 of 1 slices shown]
[im 1/1  soft-tissue]
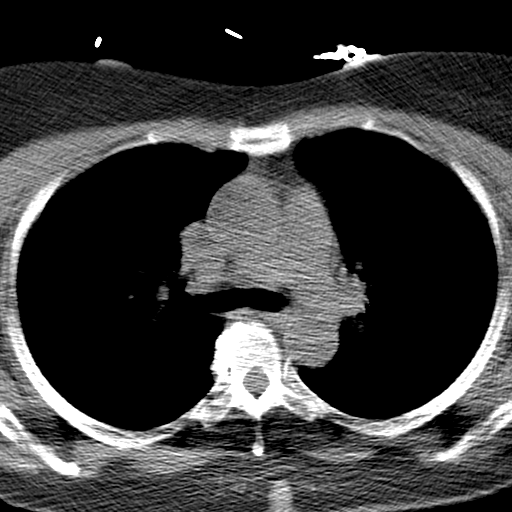

[1 of 33 positions shown; findings below may reference images not displayed]

FINDINGS: CT HEAD

No abnormal intracranial enhancement.

CTA NECK (mildly delayed bolus timing)

Aortic arch: Normal appearance of the thoracic arch, normal branch
pattern. The origins of the innominate, left Common carotid artery
and subclavian artery are widely patent.

Right carotid system: Common carotid artery is widely patent,
coursing in a straight line fashion. Normal appearance of the
carotid bifurcation without hemodynamically significant stenosis by
NASCET criteria. Normal appearance of the included internal carotid
artery.

Left carotid system: Common carotid artery is widely patent,
coursing in a straight line fashion. Normal appearance of the
carotid bifurcation without hemodynamically significant stenosis by
NASCET criteria. Normal appearance of the included internal carotid
artery.

Vertebral arteries:Delayed bolus timing limits assessment of the
vertebral artery origins, bilateral vertebral arteries are patent.

Skeleton: No acute osseous process though bone windows have not been
submitted. Grade 1/2 C4-5 anterolisthesis, associated with severe
facet arthropathy and severe C4-5 neural mild C4-5 canal stenosis.
Foraminal narrowing. Severe degenerative change of the included
RIGHT shoulder. Partially imaged loose bodies RIGHT shoulder.

Other neck: Soft tissues of the neck are nonacute though, not
tailored for evaluation. Subcentimeter superior mediastinal lymph
nodes are likely reactive. Enlarged heterogeneous thyroid most
compatible with multinodular goiter.

CTA HEAD (Mildly delayed bolus timing)

Anterior circulation: Normal appearance of the cervical internal
carotid arteries, petrous, cavernous and supra clinoid internal
carotid arteries. Widely patent anterior communicating artery.
Patent anterior and middle cerebral arteries. Mild stenosis LEFT M2
origin.

Posterior circulation: Normal appearance of the vertebral arteries,
vertebrobasilar junction and basilar artery. RIGHT vertebral artery
terminates in the posterior inferior cerebellar artery. Bilateral
posterior communicating artery infundibulum. Normal appearance of
the posterior cerebral arteries.

No large vessel occlusion, hemodynamically significant stenosis,
dissection, definite luminal irregularity, contrast extravasation or
aneurysm within the anterior nor posterior circulation.
IMPRESSION: CTA NECK: No acute vascular process or high-grade stenosis on the
neck on this delayed phase examination.

Grade 1/2 C4-5 anterolisthesis on degenerative basis resulting in
severe bilateral C4-5 neural foraminal narrowing.

CTA HEAD: No emergent large vessel occlusion or high-grade stenosis
on this delayed phase examination.

Mild stenosis LEFT M2 origin.

## 2016-03-27 IMAGING — CT CT HEAD W/O CM
1 series · 16 of 30 positions shown, 20 images · non-contrast
Comparison: CT head without contrast 08/08/2011.

CLINICAL DATA: Numbness and tingling in her lips. Right-sided
facial drooping. Symptoms began upon waking today at [DATE] a.m..

EXAM:
CT HEAD WITHOUT CONTRAST
TECHNIQUE: Contiguous axial images were obtained from the base of the skull
through the vertex without intravenous contrast.

[Series 2: headseq 4.8 h37s · axial · 0.45mm/px · z∈[+16,+173]mm · 16 of 36 slices shown, 20 images]
[im 2/36  brain]
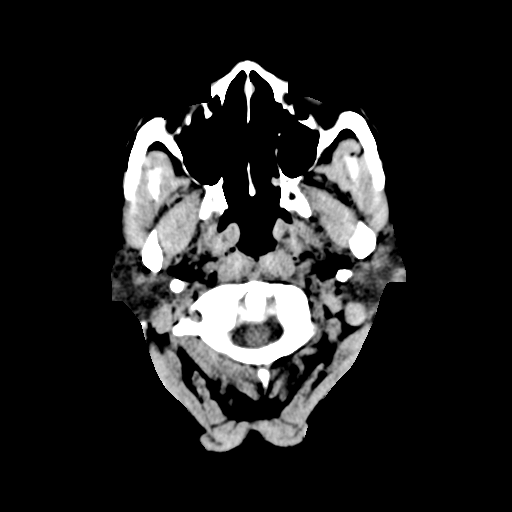
[im 2/36  bone]
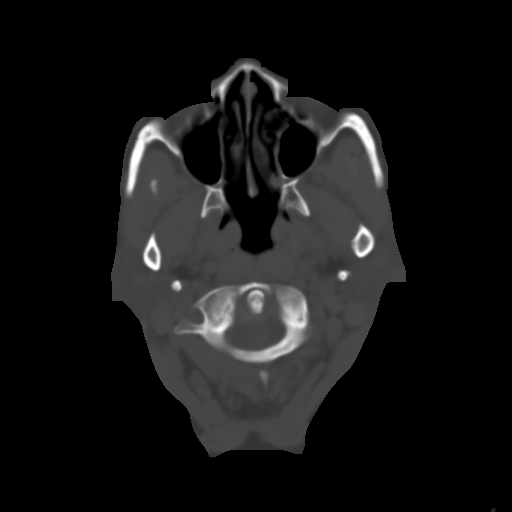
[im 4/36  brain]
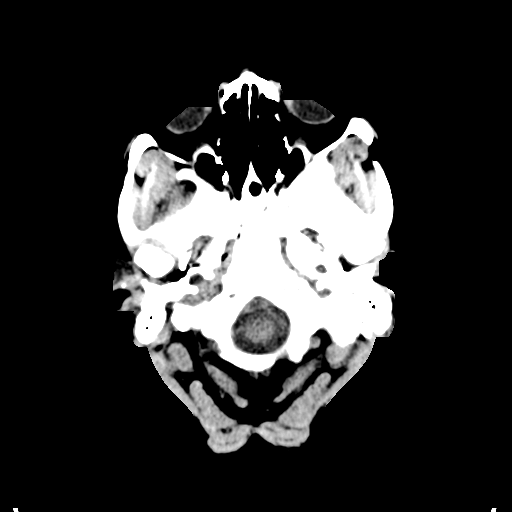
[im 7/36  brain]
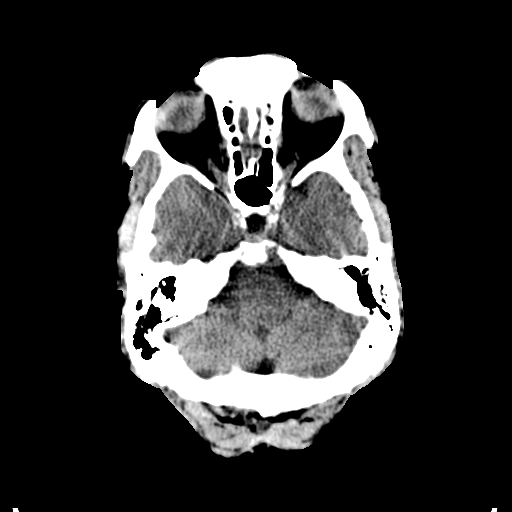
[im 9/36  brain]
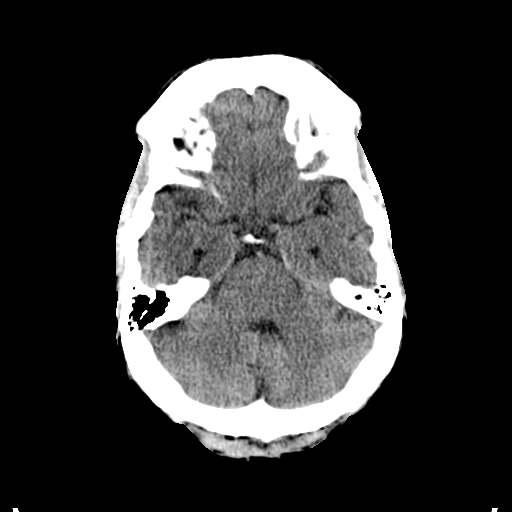
[im 10/36  brain]
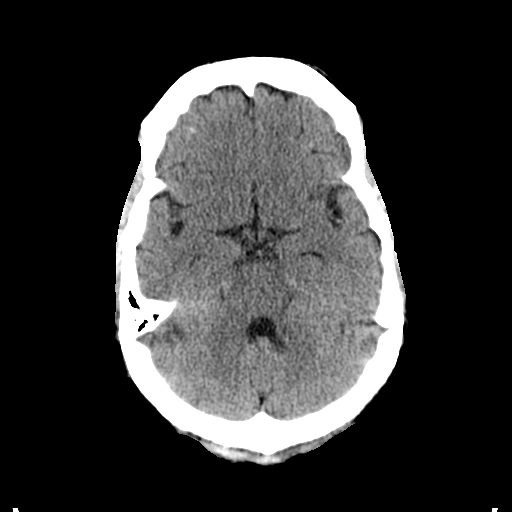
[im 10/36  bone]
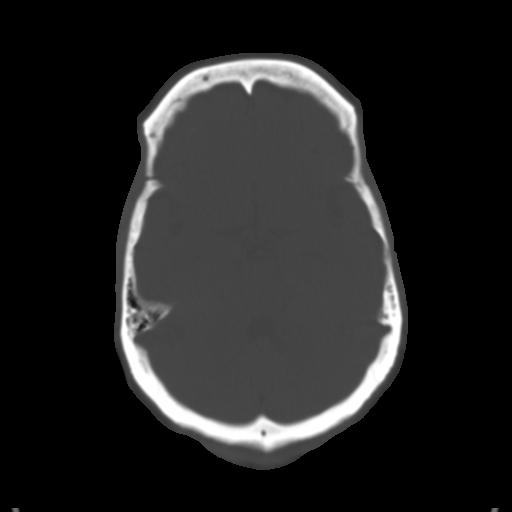
[im 13/36  brain]
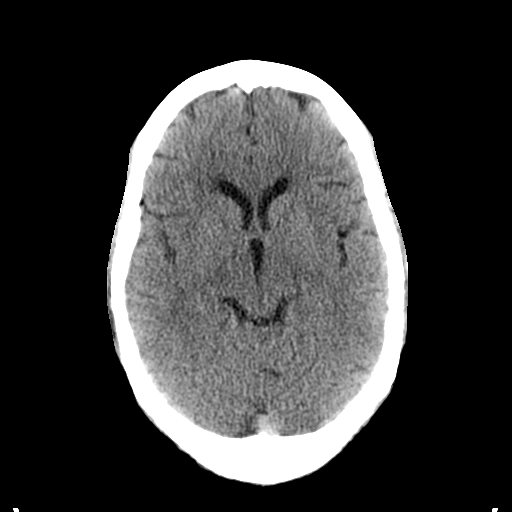
[im 15/36  brain]
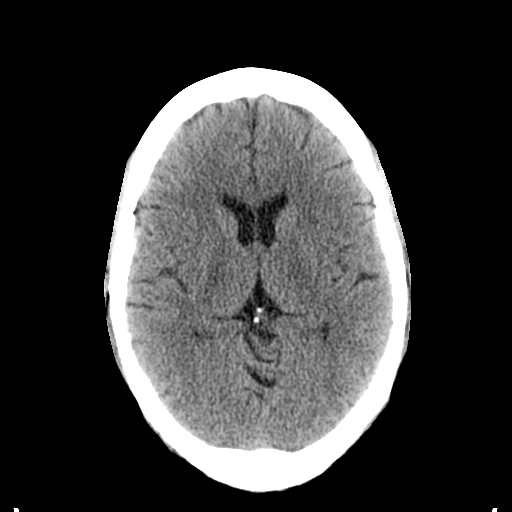
[im 17/36  brain]
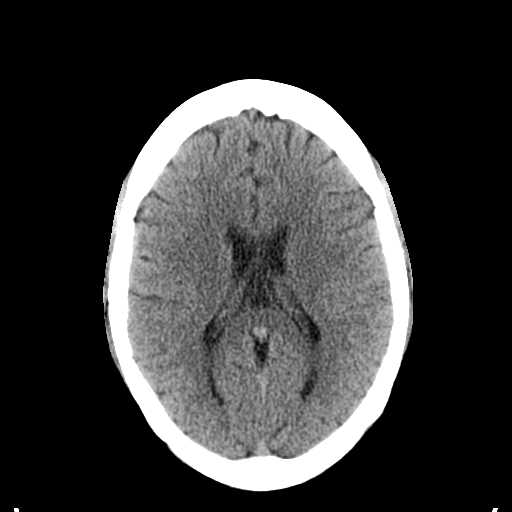
[im 19/36  brain]
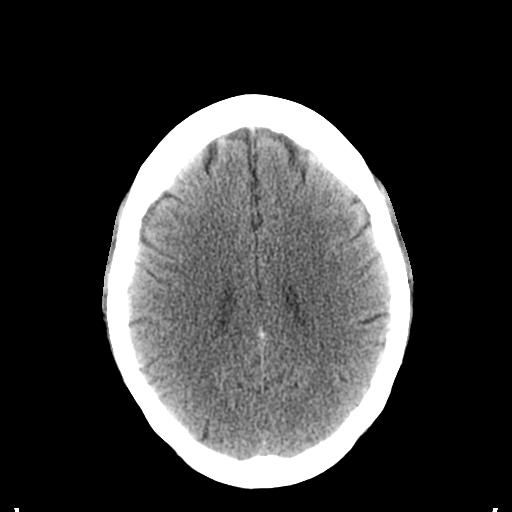
[im 19/36  bone]
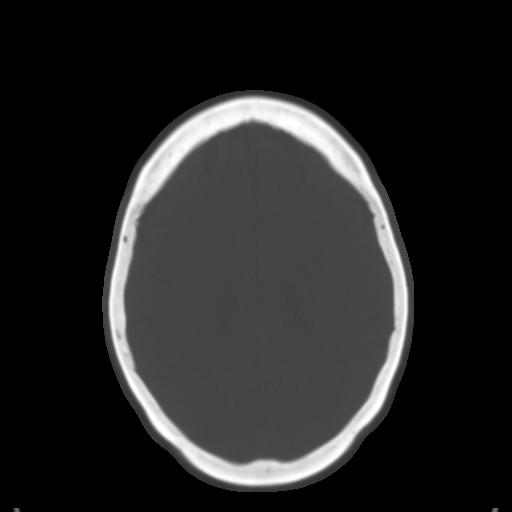
[im 21/36  brain]
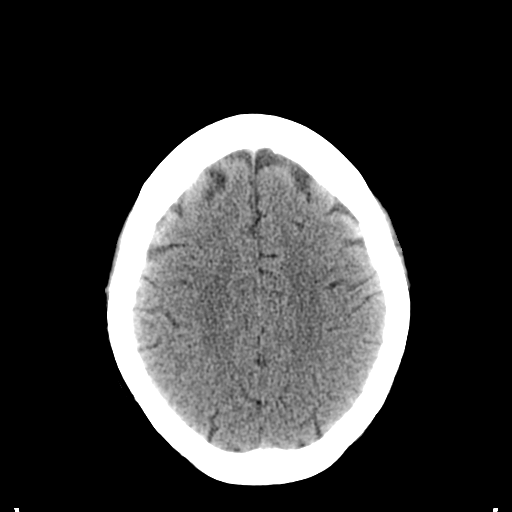
[im 23/36  brain]
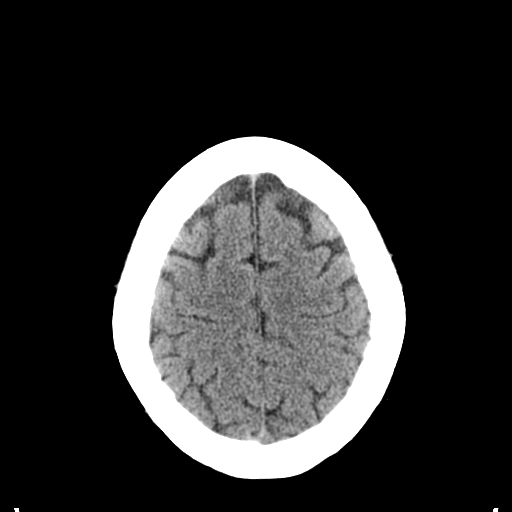
[im 26/36  brain]
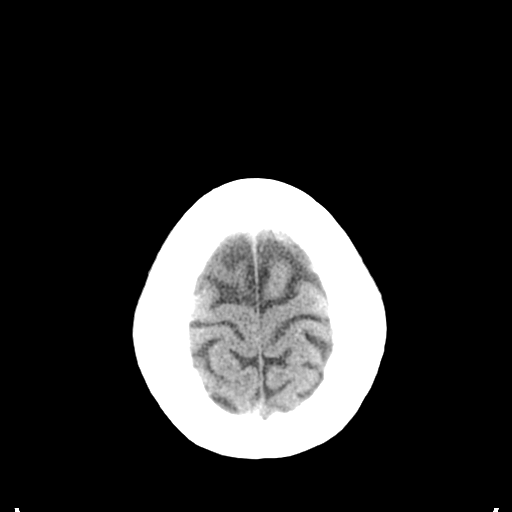
[im 27/36  brain]
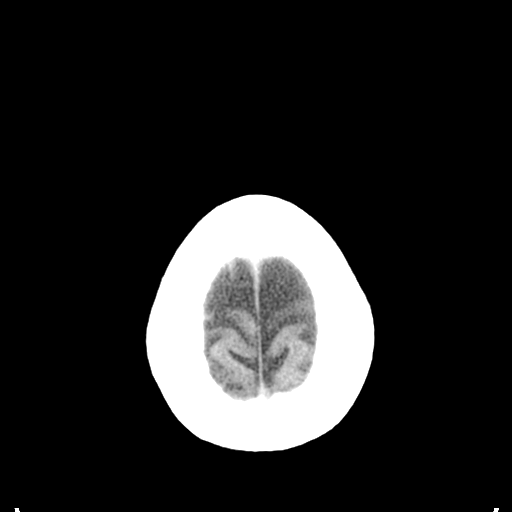
[im 27/36  bone]
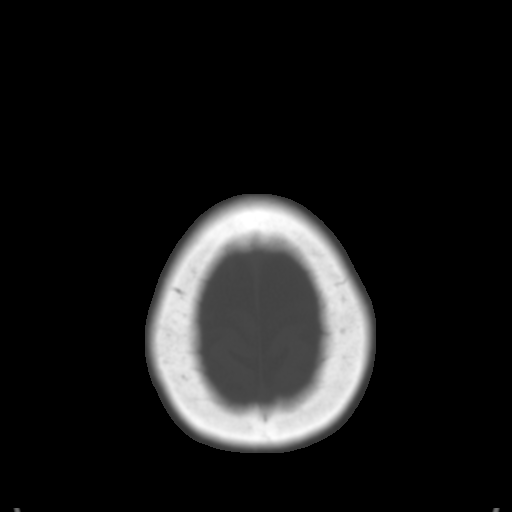
[im 29/36  brain]
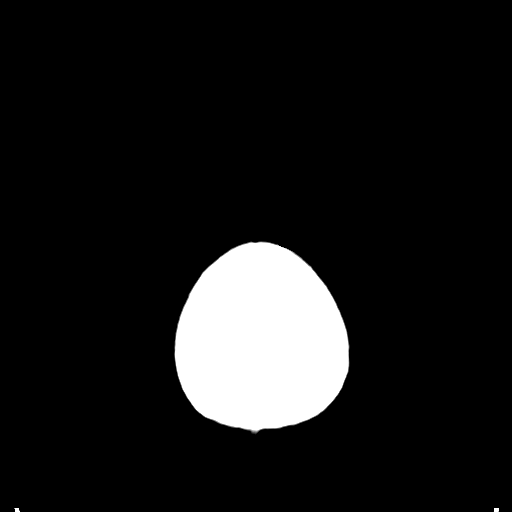
[im 32/36  brain]
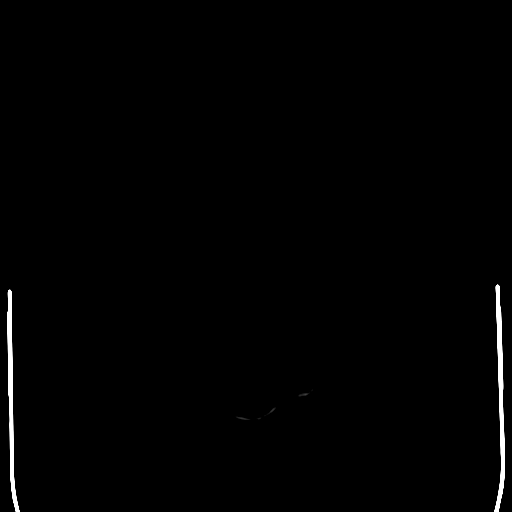
[im 34/36  brain]
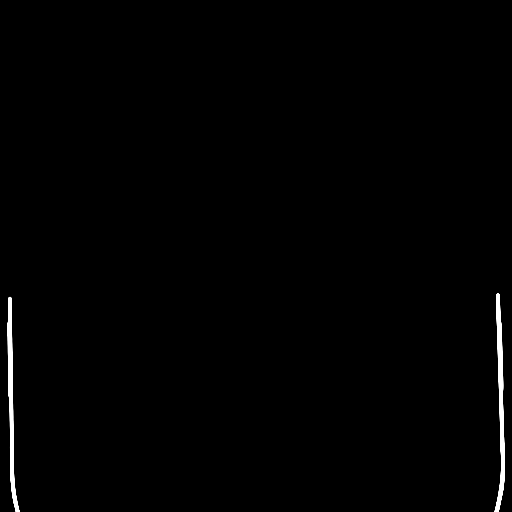

[16 of 30 positions shown; findings below may reference images not displayed]

FINDINGS: No acute cortical infarct, hemorrhage, or mass lesion is present.
The ventricles are of normal size. No significant extra-axial fluid
collection is evident. The paranasal sinuses and mastoid air cells
are clear. The calvarium is intact. No significant extracranial soft
tissue lesion is present. The globes and orbits are intact.
IMPRESSION: 1. Negative CT of the head pre

## 2016-03-27 NOTE — Telephone Encounter (Signed)
Last Visit: 10/12/15 Next Visit: 03/19/16 UDS: 07/01/15 Narc Agreement: 07/08/15   Okay to refill Tramadol?

## 2016-03-27 NOTE — Telephone Encounter (Signed)
ok 

## 2016-04-04 NOTE — Progress Notes (Signed)
Office Visit Note  Patient: [name]             Date of Birth: [birth year]           MRN: [id number]             PCP: Maggie Font, MD Referring: Iona Beard, MD Visit Date: 04/12/2016 Occupation: @GUAROCC @    Subjective:  Pain hands and lower back   History of Present Illness: [name] is a 71 y.o. female with history of seronegative rheumatoid arthritis and osteoarthritis. According to patient she's been having a lot of lower back discomfort. She does not have any radiculopathy. She continues to have decreased grip strength. She also reports intermittent swelling in her hands. Knee joint pain is tolerable.  Activities of Daily Living:  Patient reports morning stiffness for 15 minutes.   Patient Reports nocturnal pain.  Difficulty dressing/grooming: Denies Difficulty climbing stairs: Reports Difficulty getting out of chair: Reports Difficulty using hands for taps, buttons, cutlery, and/or writing: Denies   Review of Systems  Constitutional: Positive for fatigue. Negative for night sweats, weight gain, weight loss and weakness.  HENT: Positive for mouth dryness. Negative for mouth sores, trouble swallowing, trouble swallowing and nose dryness.   Eyes: Negative for pain, redness, visual disturbance and dryness.  Respiratory: Negative for cough, shortness of breath and difficulty breathing.   Cardiovascular: Negative for chest pain, palpitations, hypertension, irregular heartbeat and swelling in legs/feet.  Gastrointestinal: Positive for constipation. Negative for blood in stool and diarrhea.  Endocrine: Negative for increased urination.  Genitourinary: Negative for vaginal dryness.  Musculoskeletal: Positive for arthralgias, joint pain, joint swelling and morning stiffness. Negative for myalgias, muscle weakness, muscle tenderness and myalgias.  Skin: Negative for color change, rash, hair loss, skin tightness, ulcers and sensitivity to sunlight.    Allergic/Immunologic: Negative for susceptible to infections.  Neurological: Negative for dizziness, memory loss and night sweats.  Hematological: Negative for swollen glands.  Psychiatric/Behavioral: Positive for sleep disturbance. Negative for depressed mood. The patient is not nervous/anxious.     PMFS History:  Patient Active Problem List   Diagnosis Date Noted  . Arthritis of shoulder 04/10/2016  . High risk medication use 03/16/2016  . Primary osteoarthritis of both feet 03/16/2016  . Primary osteoarthritis of both knees 03/16/2016  . Spondylosis of lumbar region 03/16/2016  . OSA on CPAP 07/27/2015  . Lip numbness 03/19/2015  . TIA (transient ischemic attack) 03/19/2015  . 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[place]) 02/06/2015  . Post op infection 02/05/2015  . History of lumbar laminectomy for spinal cord decompression 01/17/2015  . Lumbar spinal stenosis 01/17/2015  . Primary osteoarthritis of left knee 03/25/2014  . Rheumatoid arthritis of multiple sites with negative rheumatoid factor ([place]) 03/25/2014  . History of total knee replacement, left 03/25/2014  . DERMATITIS 09/28/2008  . CONSTIPATION 05/25/2008  . DYSPNEA ON EXERTION 05/25/2008  . ANEMIA, NORMOCYTIC 04/13/2008  . ANKLE PAIN, BILATERAL 11/25/2007  . HLD (hyperlipidemia) 08/14/2006  . Morbid obesity ([place]) 12/13/2005  . CARPAL TUNNEL SYNDROME 12/13/2005  . PERIPHERAL NEUROPATHY 12/13/2005  . Essential hypertension 12/13/2005  . DYSRHYTHMIA, CARDIAC NOS 12/13/2005    Past Medical History:  Diagnosis Date  . Anemia   . Arthritis    Rheumatoid, followed by Dr. Estanislado Pandy, knees & ankles   . CHF (congestive heart failure) ([place])   . H/O cardiovascular stress test 02/2014   seen by Dr. Einar Gip - told that she was cleared for surgery  . Heart  murmur   . Hypertension   . Shortness of breath dyspnea    with exertion  . Sleep apnea    CPAP in use- QO night, [facility]- [facility]     Family History  Problem Relation  Age of Onset  . Seizures Mother   . Transient ischemic attack Mother   . Heart disease Father     CHF  . Stroke Sister    Past Surgical History:  Procedure Laterality Date  . ABDOMINAL HYSTERECTOMY    . CARPAL TUNNEL RELEASE Right   . FOOT SURGERY Bilateral    bunionectomy, calluses , also has several pins & screws  . HAND SURGERY    . JOINT REPLACEMENT     planned for 03/25/2014  . KNEE SURGERY  1990's   . LUMBAR LAMINECTOMY/DECOMPRESSION MICRODISCECTOMY N/A 01/17/2015   Procedure: L2-3, L3-4 Decompression;  Surgeon: [name], MD;  Location: [facility];  Service: Orthopedics;  Laterality: N/A;  . LUMBAR LAMINECTOMY/DECOMPRESSION MICRODISCECTOMY N/A 02/06/2015   Procedure: I AND D LUMBAR WITH WOUND VAC PLACEMENT;  Surgeon: [name], MD;  Location: [facility];  Service: Orthopedics;  Laterality: N/A;  . TOTAL KNEE ARTHROPLASTY Left 03/25/2014   Procedure: TOTAL KNEE ARTHROPLASTY;  Surgeon: Garald Balding, MD;  Location: [facility];  Service: Orthopedics;  Laterality: Left;  . TUBAL LIGATION     Social History   Social History Narrative   Lives with husband.  2 Sons.  Using walker.    Caffeine  Less then 1 cups daily.   Retired.       Objective: Vital Signs: There were no vitals taken for this visit.   Physical Exam  Constitutional: She is oriented to person, place, and time. She appears well-developed and well-nourished.  HENT:  Head: Normocephalic and atraumatic.  Eyes: Conjunctivae and EOM are normal.  Neck: Normal range of motion.  Cardiovascular: Normal rate, regular rhythm, normal heart sounds and intact distal pulses.   Pulmonary/Chest: Effort normal and breath sounds normal.  Abdominal: Soft. Bowel sounds are normal.  Lymphadenopathy:    She has no cervical adenopathy.  Neurological: She is alert and oriented to person, place, and time.  Skin: Skin is warm and dry. Capillary refill takes less than 2 seconds.  Psychiatric: She has a normal mood and affect. Her behavior is  normal.  Nursing note and vitals reviewed.    Musculoskeletal Exam: C-spine and thoracic spine good range of motion she has limited painful range of motion of her lumbar spine. She has abduction of shoulders limited to 90 bilaterally elbow joints are good range of motion she has some limitation in range of motion of bilateral wrist joints with synovial thickening no synovitis was noted. She synovial thickening of bilateral MCP joints and PIP joints with ulnar deviation without any active synovitis. Her left total knee replacement is doing well. Hip joints are good range of motion.  CDAI Exam: CDAI Homunculus Exam:   Joint Counts:  CDAI Tender Joint count: 0 CDAI Swollen Joint count: 0  Global Assessments:  Patient Global Assessment: 4 Provider Global Assessment: 4  CDAI Calculated Score: 8    Investigation: Findings:  August 29, 2015:  CBC was normal.  Comprehensive metabolic panel showed creatinine of 1.04, GFR 63.  Her RAPID3 score today was 3, which was under moderate severity, although she had no synovitis.   10/12/2015.  X-ray of right elbow joint, 2 views today, showed joint space was normal.  There was questionable erosion  over the medial epicondyle, which could be a cyst or erosion, which was old.  She has olecranon spur.  Right hip joint x-ray, 2 views, showed mild inferomedial joint narrowing.  No erosive changes.  No chondrocalcinosis consistent with early osteoarthritis.   Her labs from October 19th 2010  showed sed rate of 46 and 49 at two different times.  Uric acid was 7.4 which was slightly elevated.  CK, rheumatoid factor, anti-CCP, ANA, ACE level, and HLAB-27 were negative.  According to the patient, she also had a PPD in September which was negative.    June 2017 TB gold negative   Orders Only on 02/21/2016  Component Date Value Ref Range Status  . Sodium 02/21/2016 141  135 - 146 mmol/L Final  . Potassium 02/21/2016 4.3  3.5 - 5.3 mmol/L Final  . Chloride  02/21/2016 106  98 - 110 mmol/L Final  . CO2 02/21/2016 25  20 - 31 mmol/L Final  . Glucose, Bld 02/21/2016 88  65 - 99 mg/dL Final  . BUN 02/21/2016 21  7 - 25 mg/dL Final  . Creat 02/21/2016 1.18* 0.60 - 0.93 mg/dL Final   Comment:   For patients > or = 71 years of age: The upper reference limit for Creatinine is approximately 13% higher for people identified as African-American.     . Total Bilirubin 02/21/2016 0.4  0.2 - 1.2 mg/dL Final  . Alkaline Phosphatase 02/21/2016 107  33 - 130 U/L Final  . AST 02/21/2016 15  10 - 35 U/L Final  . ALT 02/21/2016 10  6 - 29 U/L Final  . Total Protein 02/21/2016 7.0  6.1 - 8.1 g/dL Final  . Albumin 02/21/2016 3.6  3.6 - 5.1 g/dL Final  . Calcium 02/21/2016 8.9  8.6 - 10.4 mg/dL Final  . GFR, Est African American 02/21/2016 54* >=60 mL/min Final  . GFR, Est Non African American 02/21/2016 47* >=60 mL/min Final  . WBC 02/21/2016 6.5  3.8 - 10.8 K/uL Final  . RBC 02/21/2016 3.93  3.80 - 5.10 MIL/uL Final  . Hemoglobin 02/21/2016 11.1* 11.7 - 15.5 g/dL Final  . HCT 02/21/2016 35.1  35.0 - 45.0 % Final  . MCV 02/21/2016 89.3  80.0 - 100.0 fL Final  . MCH 02/21/2016 28.2  27.0 - 33.0 pg Final  . MCHC 02/21/2016 31.6* 32.0 - 36.0 g/dL Final  . RDW 02/21/2016 13.4  11.0 - 15.0 % Final  . Platelets 02/21/2016 266  140 - 400 K/uL Final  . MPV 02/21/2016 10.9  7.5 - 12.5 fL Final  . Neutro Abs 02/21/2016 4160  1,500 - 7,800 cells/uL Final  . Lymphs Abs 02/21/2016 1690  850 - 3,900 cells/uL Final  . Monocytes Absolute 02/21/2016 390  200 - 950 cells/uL Final  . Eosinophils Absolute 02/21/2016 195  15 - 500 cells/uL Final  . Basophils Absolute 02/21/2016 65  0 - 200 cells/uL Final  . Neutrophils Relative % 02/21/2016 64  % Final  . Lymphocytes Relative 02/21/2016 26  % Final  . Monocytes Relative 02/21/2016 6  % Final  . Eosinophils Relative 02/21/2016 3  % Final  . Basophils Relative 02/21/2016 1  % Final  . Smear Review 02/21/2016 Criteria for  review not met   Final   Imaging: No results found.  Speciality Comments: No specialty comments available.    Procedures:  No procedures performed Allergies: Patient has no known allergies.   Assessment / Plan:     Visit Diagnoses: Rheumatoid arthritis  of multiple sites with negative rheumatoid factor ([place]) - Seronegative erosive disease. She has no active synovitis on examination today. She does have decreased grip strength due to chronic changes.  High risk medication use - Humira every other week 40 mg. Her labs have been stable. Patient reports she had PPD at her work in March and she was sent is that report. We'll check her labs again in May and then every 3 months to monitor for drug toxicity.  History of total knee replacement, left: Chronic pain  She is complaining of increased muscle cramps. I've advised her to use magnesium  at nighttime.  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  No orders of the defined types were placed in this encounter.   Face-to-face time spent with patient was 30 minutes. 50% of time was spent in counseling and coordination of care.  Follow-Up Instructions: No Follow-up on file.   Bo Merino, MD  Note - This record has been created using Editor, commissioning.  Chart creation errors have been sought, but may not always  have been located. Such creation errors do not reflect on  the standard of medical care.

## 2016-04-10 DIAGNOSIS — M19019 Primary osteoarthritis, unspecified shoulder: Secondary | ICD-10-CM | POA: Insufficient documentation

## 2016-04-12 ENCOUNTER — Ambulatory Visit (INDEPENDENT_AMBULATORY_CARE_PROVIDER_SITE_OTHER): Payer: Medicare Other | Admitting: Rheumatology

## 2016-04-12 ENCOUNTER — Encounter: Payer: Self-pay | Admitting: Rheumatology

## 2016-04-12 VITALS — BP 144/76 | HR 82 | Resp 16 | Ht 60.0 in | Wt 218.0 lb

## 2016-04-12 DIAGNOSIS — Z9989 Dependence on other enabling machines and devices: Secondary | ICD-10-CM | POA: Diagnosis not present

## 2016-04-12 DIAGNOSIS — G459 Transient cerebral ischemic attack, unspecified: Secondary | ICD-10-CM

## 2016-04-12 DIAGNOSIS — M47816 Spondylosis without myelopathy or radiculopathy, lumbar region: Secondary | ICD-10-CM

## 2016-04-12 DIAGNOSIS — M19019 Primary osteoarthritis, unspecified shoulder: Secondary | ICD-10-CM

## 2016-04-12 DIAGNOSIS — E782 Mixed hyperlipidemia: Secondary | ICD-10-CM | POA: Diagnosis not present

## 2016-04-12 DIAGNOSIS — I10 Essential (primary) hypertension: Secondary | ICD-10-CM | POA: Diagnosis not present

## 2016-04-12 DIAGNOSIS — Z96652 Presence of left artificial knee joint: Secondary | ICD-10-CM

## 2016-04-12 DIAGNOSIS — M0609 Rheumatoid arthritis without rheumatoid factor, multiple sites: Secondary | ICD-10-CM | POA: Diagnosis not present

## 2016-04-12 DIAGNOSIS — G4733 Obstructive sleep apnea (adult) (pediatric): Secondary | ICD-10-CM | POA: Diagnosis not present

## 2016-04-12 DIAGNOSIS — Z79899 Other long term (current) drug therapy: Secondary | ICD-10-CM | POA: Diagnosis not present

## 2016-04-12 NOTE — Progress Notes (Signed)
Rheumatology Medication Review by a Pharmacist Does the patient feel that his/her medications are working for him/her?  Yes Has the patient been experiencing any side effects to the medications prescribed?  No Does the patient have any problems obtaining medications?  No  Issues to address at subsequent visits: None   Pharmacist comments:  [name] is a pleasant 71 yo F who presents for follow up of her rheumatoid arthritis.  She is currently taking Humira 40 mg every other week.  Most recent standing labs were on 02/21/16 which were stable.  She will be due for standing labs again in May 2018.  Most recent TB Gold on file was from 07/01/15.  Patient reports she recently had two TB Skin tests done at work in March.  I asked patient to send [place] a copy of those results.  Patient denies any questions or concerns regarding her medications at this time.    Elisabeth Most, Pharm.D., BCPS, CPP Clinical Pharmacist Pager: [phone] Phone: [phone] 04/12/2016 11:04 AM

## 2016-04-12 NOTE — Patient Instructions (Signed)
Please fax [place] a copy of your recent TB test.  Our fax number is [phone]  Standing Labs We placed an order today for your standing lab work.    Please come back and get your standing labs in May 2018 and every 3 months.    We have open lab Monday through Friday from 8:30-11:30 AM and 1:30-4 PM at the office of Dr. Tresa Moore, PA.   The office is located at [address] [place], [place], [place] [address] No appointment is necessary.   Labs are drawn by [facility].  You may receive a bill from [facility] for your lab work.

## 2016-04-19 ENCOUNTER — Encounter: Payer: Self-pay | Admitting: Pharmacist

## 2016-04-19 DIAGNOSIS — Z79899 Other long term (current) drug therapy: Secondary | ICD-10-CM

## 2016-04-19 NOTE — Progress Notes (Signed)
Received a fax from patient's employer with copy of TB test.  Patient had negative TB skin test on 01/24/16 and 02/10/16.  Will send results to be scanned.

## 2016-05-09 ENCOUNTER — Other Ambulatory Visit: Payer: Self-pay | Admitting: Rheumatology

## 2016-05-10 NOTE — Telephone Encounter (Signed)
Last Visit: 04/12/16 Next Visit: 09/13/16 UDS: 07/01/15 Narc Agreement: 07/08/15   Okay to refill Tramadol?

## 2016-05-10 NOTE — Telephone Encounter (Signed)
ok 

## 2016-05-11 ENCOUNTER — Other Ambulatory Visit: Payer: Self-pay | Admitting: *Deleted

## 2016-05-11 DIAGNOSIS — Z79899 Other long term (current) drug therapy: Secondary | ICD-10-CM

## 2016-05-12 LAB — CBC WITH DIFFERENTIAL/PLATELET
Basophils Absolute: 68 cells/uL (ref 0–200)
Basophils Relative: 1 %
Eosinophils Absolute: 204 cells/uL (ref 15–500)
Eosinophils Relative: 3 %
HCT: 36.3 % (ref 35.0–45.0)
Hemoglobin: 11.7 g/dL (ref 11.7–15.5)
Lymphocytes Relative: 30 %
Lymphs Abs: 2040 cells/uL (ref 850–3900)
MCH: 28.8 pg (ref 27.0–33.0)
MCHC: 32.2 g/dL (ref 32.0–36.0)
MCV: 89.4 fL (ref 80.0–100.0)
MPV: 10.7 fL (ref 7.5–12.5)
Monocytes Absolute: 476 cells/uL (ref 200–950)
Monocytes Relative: 7 %
Neutro Abs: 4012 cells/uL (ref 1500–7800)
Neutrophils Relative %: 59 %
Platelets: 272 10*3/uL (ref 140–400)
RBC: 4.06 MIL/uL (ref 3.80–5.10)
RDW: 13.8 % (ref 11.0–15.0)
WBC: 6.8 10*3/uL (ref 3.8–10.8)

## 2016-05-12 LAB — COMPLETE METABOLIC PANEL WITH GFR
ALT: 10 U/L (ref 6–29)
AST: 17 U/L (ref 10–35)
Albumin: 3.7 g/dL (ref 3.6–5.1)
Alkaline Phosphatase: 107 U/L (ref 33–130)
BUN: 26 mg/dL — ABNORMAL HIGH (ref 7–25)
CO2: 28 mmol/L (ref 20–31)
Calcium: 9.1 mg/dL (ref 8.6–10.4)
Chloride: 105 mmol/L (ref 98–110)
Creat: 1.11 mg/dL — ABNORMAL HIGH (ref 0.60–0.93)
GFR, Est African American: 58 mL/min — ABNORMAL LOW (ref >=60)
GFR, Est Non African American: 50 mL/min — ABNORMAL LOW (ref >=60)
Glucose, Bld: 77 mg/dL (ref 65–99)
Potassium: 4.2 mmol/L (ref 3.5–5.3)
Sodium: 141 mmol/L (ref 135–146)
Total Bilirubin: 0.3 mg/dL (ref 0.2–1.2)
Total Protein: 7.3 g/dL (ref 6.1–8.1)

## 2016-05-12 NOTE — Progress Notes (Signed)
stable °

## 2016-06-06 ENCOUNTER — Other Ambulatory Visit: Payer: Self-pay | Admitting: Rheumatology

## 2016-06-06 NOTE — Telephone Encounter (Signed)
Last Visit: 04/12/16 Next Visit: 09/13/16 Labs: 05/11/16 Stable TB Gold: 07/01/15 Neg  Okay to refill Humira?

## 2016-06-06 NOTE — Telephone Encounter (Signed)
ok 

## 2016-06-30 ENCOUNTER — Other Ambulatory Visit: Payer: Self-pay | Admitting: Rheumatology

## 2016-07-02 NOTE — Telephone Encounter (Signed)
Last Visit: 04/12/16 Next Visit: 09/13/16 UDS: 07/01/15 Narc Agreement: 07/08/15   Okay to refill Tramadol?

## 2016-07-02 NOTE — Telephone Encounter (Signed)
ok 

## 2016-07-16 ENCOUNTER — Telehealth: Payer: Self-pay | Admitting: Rheumatology

## 2016-07-16 NOTE — Telephone Encounter (Signed)
Patient advised she is due for labs at early part of August. Patient verbalized understanding.

## 2016-07-16 NOTE — Telephone Encounter (Signed)
Patient called wanting to know when she needs to get her labs done again.  CB#[phone]

## 2016-07-26 ENCOUNTER — Ambulatory Visit (INDEPENDENT_AMBULATORY_CARE_PROVIDER_SITE_OTHER): Payer: Medicare Other | Admitting: Orthopaedic Surgery

## 2016-07-26 ENCOUNTER — Other Ambulatory Visit (INDEPENDENT_AMBULATORY_CARE_PROVIDER_SITE_OTHER): Payer: Self-pay | Admitting: Orthopaedic Surgery

## 2016-07-26 ENCOUNTER — Encounter (HOSPITAL_COMMUNITY): Payer: Self-pay | Admitting: *Deleted

## 2016-07-26 ENCOUNTER — Encounter (INDEPENDENT_AMBULATORY_CARE_PROVIDER_SITE_OTHER): Payer: Self-pay | Admitting: Orthopaedic Surgery

## 2016-07-26 ENCOUNTER — Ambulatory Visit (INDEPENDENT_AMBULATORY_CARE_PROVIDER_SITE_OTHER): Payer: Medicare Other

## 2016-07-26 VITALS — BP 131/79 | HR 80 | Ht 61.0 in | Wt 217.0 lb

## 2016-07-26 DIAGNOSIS — M79671 Pain in right foot: Secondary | ICD-10-CM

## 2016-07-26 DIAGNOSIS — S92321A Displaced fracture of second metatarsal bone, right foot, initial encounter for closed fracture: Secondary | ICD-10-CM | POA: Diagnosis not present

## 2016-07-26 DIAGNOSIS — S92301A Fracture of unspecified metatarsal bone(s), right foot, initial encounter for closed fracture: Secondary | ICD-10-CM

## 2016-07-26 NOTE — Progress Notes (Signed)
Spoke with pt for pre-op call. She states the only heart hx she has is a heart murmur. She states that has never given her any issues. Denies being diabetic.

## 2016-07-26 NOTE — Progress Notes (Signed)
Office Visit Note   Patient: [name]           Date of Birth: April 21, 1945           MRN: [id number] Visit Date: 07/26/2016              Requested by: Iona Beard, [address] [place], [place] [address] PCP: Iona Beard, MD   Assessment & Plan: Visit Diagnoses:  1. Pain in right foot   2. Displaced fracture of second metatarsal bone, right foot, initial encounter for closed fracture     Plan: Planned ORIF right second metatarsal displaced angulated comminuted shaft fracture. She use her walker keep her foot elevated over the weekend. Clear liquid breakfast then nothing by mouth Monday a.m. for surgery late Monday afternoon early evening. Risks of surgery discussed questions were elicited and answered. Plan would be overnight stay.  Follow-Up Instructions: No Follow-up on file.   Orders:  Orders Placed This Encounter  Procedures  . XR Foot Complete Right   No orders of the defined types were placed in this encounter.     Procedures: No procedures performed   Clinical Data: No additional findings.   Subjective: Chief Complaint  Patient presents with  . Right Foot - Fracture    HPI patient was walking she visited her doctor gradually noted some gradual progressive increased pain in her foot she does not have a history of falling or slipping. She had increased pain and swelling and x-rays at Dr. Tally Due office showed displaced comminuted angulated second metatarsal fracture and he called our office 16 to be seen and surgery scheduled.  Review of Systems positive for rheumatoid arthritis on Humira, tramadol and Naprosyn. History of carpal tunnel syndrome lumbar spinal stenosis postop. Past history of a remote TIA. Patient had a postoperative infection lumbar spine which was successfully treated with the VAC. Otherwise negative as it pertains history of present illness.   Objective: Vital Signs: BP 131/79   Pulse 80   Ht 5\' 1"  (1.549 m)   Wt 217 lb  (98.4 kg)   BMI 41.00 kg/m   Physical Exam  Constitutional: She is oriented to person, place, and time. She appears well-developed.  HENT:  Head: Normocephalic.  Right Ear: External ear normal.  Left Ear: External ear normal.  Eyes: Pupils are equal, round, and reactive to light.  Neck: No tracheal deviation present. No thyromegaly present.  Cardiovascular: Normal rate.   Pulmonary/Chest: Effort normal.  Abdominal: Soft.  Musculoskeletal:  Well-healed lumbar midline incision. Right forefoot swelling with the prominent bone with mobility at second metatarsal. Skin is intact there is no plantar foot lesions. She does have a bunion. Pulses intact sensation is intact.  Neurological: She is alert and oriented to person, place, and time.  Skin: Skin is warm and dry.  Psychiatric: She has a normal mood and affect. Her behavior is normal.    Ortho Exam  Specialty Comments:  No specialty comments available.  Imaging: Xr Foot Complete Right  Result Date: 07/26/2016 Review x-rays right foot demonstrates second metatarsal midshaft fracture with some comminution shortening high riding angulation. Lisfranc joint appears reduced. Impression: Comminuted displaced angulated second metatarsal shaft fracture    PMFS History: Patient Active Problem List   Diagnosis Date Noted  . Arthritis of shoulder 04/10/2016  . High risk medication use 03/16/2016  . Primary osteoarthritis of both feet 03/16/2016  . Primary osteoarthritis of both knees 03/16/2016  . Spondylosis of lumbar region  03/16/2016  . OSA on CPAP 07/27/2015  . Lip numbness 03/19/2015  . TIA (transient ischemic attack) 03/19/2015  . 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[place]) 02/06/2015  . Post op infection 02/05/2015  . History of lumbar laminectomy for spinal cord decompression 01/17/2015  . Lumbar spinal stenosis 01/17/2015  . Primary osteoarthritis of left knee 03/25/2014  . Rheumatoid arthritis of multiple sites with negative  rheumatoid factor ([facility]) 03/25/2014  . History of total knee replacement, left 03/25/2014  . DERMATITIS 09/28/2008  . CONSTIPATION 05/25/2008  . DYSPNEA ON EXERTION 05/25/2008  . ANEMIA, NORMOCYTIC 04/13/2008  . ANKLE PAIN, BILATERAL 11/25/2007  . HLD (hyperlipidemia) 08/14/2006  . Morbid obesity ([place]) 12/13/2005  . CARPAL TUNNEL SYNDROME 12/13/2005  . PERIPHERAL NEUROPATHY 12/13/2005  . Essential hypertension 12/13/2005  . DYSRHYTHMIA, CARDIAC NOS 12/13/2005   Past Medical History:  Diagnosis Date  . Anemia   . Arthritis    Rheumatoid, followed by Dr. Estanislado Pandy, knees & ankles   . CHF (congestive heart failure) ([place])   . H/O cardiovascular stress test 02/2014   seen by Dr. Einar Gip - told that she was cleared for surgery  . Heart murmur   . Hypertension   . Shortness of breath dyspnea    with exertion  . Sleep apnea    CPAP in use- QO night, study done at [facility]- 2007     Family History  Problem Relation Age of Onset  . Seizures Mother   . Transient ischemic attack Mother   . Heart disease Father        CHF  . Stroke Sister     Past Surgical History:  Procedure Laterality Date  . ABDOMINAL HYSTERECTOMY    . CARPAL TUNNEL RELEASE Right   . FOOT SURGERY Bilateral    bunionectomy, calluses , also has several pins & screws  . HAND SURGERY    . JOINT REPLACEMENT     planned for 03/25/2014  . KNEE SURGERY  1990's   . LUMBAR LAMINECTOMY/DECOMPRESSION MICRODISCECTOMY N/A 01/17/2015   Procedure: L2-3, L3-4 Decompression;  Surgeon: [name], MD;  Location: [employer] [facility];  Service: Orthopedics;  Laterality: N/A;  . LUMBAR LAMINECTOMY/DECOMPRESSION MICRODISCECTOMY N/A 02/06/2015   Procedure: I AND D LUMBAR WITH WOUND VAC PLACEMENT;  Surgeon: [name], MD;  Location: [facility];  Service: Orthopedics;  Laterality: N/A;  . TOTAL KNEE ARTHROPLASTY Left 03/25/2014   Procedure: TOTAL KNEE ARTHROPLASTY;  Surgeon: Garald Balding, MD;  Location: [place] [facility];  Service: Orthopedics;  Laterality: Left;  .  TUBAL LIGATION     Social History   Occupational History  . Not on file.   Social History Main Topics  . Smoking status: Former Smoker    Quit date: 03/16/1980  . Smokeless tobacco: Never Used  . Alcohol use No  . Drug use: No  . Sexual activity: Not on file

## 2016-07-27 MED ORDER — CEFAZOLIN SODIUM-DEXTROSE 2-4 GM/100ML-% IV SOLN
2.0000 g | INTRAVENOUS | Status: AC
  Administered 2016-07-30: 2 g via INTRAVENOUS
  Filled 2016-07-27: qty 100

## 2016-07-30 ENCOUNTER — Ambulatory Visit (HOSPITAL_COMMUNITY): Payer: Medicare Other | Admitting: Anesthesiology

## 2016-07-30 ENCOUNTER — Encounter (HOSPITAL_COMMUNITY)
Admission: RE | Disposition: A | Discharge status: Home / Self Care | Payer: Self-pay | Source: Ambulatory Visit | Attending: Orthopaedic Surgery

## 2016-07-30 ENCOUNTER — Encounter (HOSPITAL_COMMUNITY): Payer: Self-pay

## 2016-07-30 ENCOUNTER — Observation Stay (HOSPITAL_COMMUNITY)
Admission: RE | Admit: 2016-07-30 | Discharge: 2016-07-31 | Disposition: A | Discharge status: Home / Self Care | Payer: Medicare Other | Source: Ambulatory Visit | Attending: Orthopaedic Surgery | Admitting: Orthopaedic Surgery

## 2016-07-30 DIAGNOSIS — I509 Heart failure, unspecified: Secondary | ICD-10-CM | POA: Diagnosis not present

## 2016-07-30 DIAGNOSIS — M069 Rheumatoid arthritis, unspecified: Secondary | ICD-10-CM | POA: Insufficient documentation

## 2016-07-30 DIAGNOSIS — Z96652 Presence of left artificial knee joint: Secondary | ICD-10-CM | POA: Insufficient documentation

## 2016-07-30 DIAGNOSIS — S92301A Fracture of unspecified metatarsal bone(s), right foot, initial encounter for closed fracture: Secondary | ICD-10-CM

## 2016-07-30 DIAGNOSIS — I10 Essential (primary) hypertension: Secondary | ICD-10-CM | POA: Diagnosis not present

## 2016-07-30 DIAGNOSIS — S92322P Displaced fracture of second metatarsal bone, left foot, subsequent encounter for fracture with malunion: Secondary | ICD-10-CM

## 2016-07-30 DIAGNOSIS — Z9851 Tubal ligation status: Secondary | ICD-10-CM | POA: Diagnosis not present

## 2016-07-30 DIAGNOSIS — Z87891 Personal history of nicotine dependence: Secondary | ICD-10-CM | POA: Diagnosis not present

## 2016-07-30 DIAGNOSIS — S92321A Displaced fracture of second metatarsal bone, right foot, initial encounter for closed fracture: Principal | ICD-10-CM | POA: Insufficient documentation

## 2016-07-30 DIAGNOSIS — M1990 Unspecified osteoarthritis, unspecified site: Secondary | ICD-10-CM | POA: Diagnosis not present

## 2016-07-30 DIAGNOSIS — G4730 Sleep apnea, unspecified: Secondary | ICD-10-CM | POA: Diagnosis not present

## 2016-07-30 DIAGNOSIS — X58XXXA Exposure to other specified factors, initial encounter: Secondary | ICD-10-CM | POA: Diagnosis not present

## 2016-07-30 DIAGNOSIS — K219 Gastro-esophageal reflux disease without esophagitis: Secondary | ICD-10-CM | POA: Insufficient documentation

## 2016-07-30 DIAGNOSIS — S92309A Fracture of unspecified metatarsal bone(s), unspecified foot, initial encounter for closed fracture: Secondary | ICD-10-CM | POA: Diagnosis present

## 2016-07-30 DIAGNOSIS — I110 Hypertensive heart disease with heart failure: Secondary | ICD-10-CM | POA: Insufficient documentation

## 2016-07-30 DIAGNOSIS — Z8673 Personal history of transient ischemic attack (TIA), and cerebral infarction without residual deficits: Secondary | ICD-10-CM | POA: Diagnosis not present

## 2016-07-30 HISTORY — DX: Cerebral infarction, unspecified: I63.9

## 2016-07-30 HISTORY — DX: Constipation, unspecified: K59.00

## 2016-07-30 HISTORY — PX: ORIF TOE FRACTURE: SHX5032

## 2016-07-30 HISTORY — DX: Gastro-esophageal reflux disease without esophagitis: K21.9

## 2016-07-30 HISTORY — DX: Cramp and spasm: R25.2

## 2016-07-30 LAB — COMPREHENSIVE METABOLIC PANEL
ALT: 12 U/L — ABNORMAL LOW (ref 14–54)
AST: 18 U/L (ref 15–41)
Albumin: 3.5 g/dL (ref 3.5–5.0)
Alkaline Phosphatase: 115 U/L (ref 38–126)
Anion gap: 11 (ref 5–15)
BUN: 22 mg/dL — ABNORMAL HIGH (ref 6–20)
CO2: 24 mmol/L (ref 22–32)
Calcium: 9 mg/dL (ref 8.9–10.3)
Chloride: 106 mmol/L (ref 101–111)
Creatinine, Ser: 1.2 mg/dL — ABNORMAL HIGH (ref 0.44–1.00)
GFR calc Af Amer: 52 mL/min — ABNORMAL LOW (ref >60)
GFR calc non Af Amer: 45 mL/min — ABNORMAL LOW (ref >60)
Glucose, Bld: 81 mg/dL (ref 65–99)
Potassium: 4.2 mmol/L (ref 3.5–5.1)
Sodium: 141 mmol/L (ref 135–145)
Total Bilirubin: 0.6 mg/dL (ref 0.3–1.2)
Total Protein: 7.4 g/dL (ref 6.5–8.1)

## 2016-07-30 LAB — CBC
HCT: 35.6 % — ABNORMAL LOW (ref 36.0–46.0)
Hemoglobin: 11.3 g/dL — ABNORMAL LOW (ref 12.0–15.0)
MCH: 28.5 pg (ref 26.0–34.0)
MCHC: 31.7 g/dL (ref 30.0–36.0)
MCV: 89.9 fL (ref 78.0–100.0)
Platelets: 264 10*3/uL (ref 150–400)
RBC: 3.96 MIL/uL (ref 3.87–5.11)
RDW: 13.4 % (ref 11.5–15.5)
WBC: 7.8 10*3/uL (ref 4.0–10.5)

## 2016-07-30 SURGERY — OPEN REDUCTION INTERNAL FIXATION (ORIF) METATARSAL (TOE) FRACTURE
Anesthesia: General | Laterality: Right

## 2016-07-30 MED ORDER — NAPROXEN 250 MG PO TABS
500.0000 mg | ORAL_TABLET | Freq: Two times a day (BID) | ORAL | Status: DC
  Administered 2016-07-30 – 2016-07-31 (×2): 500 mg via ORAL
  Filled 2016-07-30 (×2): qty 2

## 2016-07-30 MED ORDER — ONDANSETRON HCL 4 MG/2ML IJ SOLN
INTRAMUSCULAR | Status: DC | PRN
  Administered 2016-07-30: 4 mg via INTRAVENOUS

## 2016-07-30 MED ORDER — ONDANSETRON HCL 4 MG/2ML IJ SOLN
INTRAMUSCULAR | Status: AC
  Filled 2016-07-30: qty 2

## 2016-07-30 MED ORDER — DEXAMETHASONE SODIUM PHOSPHATE 10 MG/ML IJ SOLN
INTRAMUSCULAR | Status: AC
  Filled 2016-07-30: qty 1

## 2016-07-30 MED ORDER — SODIUM CHLORIDE 0.45 % IV SOLN
INTRAVENOUS | Status: DC
  Administered 2016-07-30: 22:00:00 via INTRAVENOUS

## 2016-07-30 MED ORDER — ONDANSETRON HCL 4 MG PO TABS: 4.0000 mg | ORAL_TABLET | Freq: Four times a day (QID) | ORAL | Status: DC | PRN

## 2016-07-30 MED ORDER — PROPOFOL 10 MG/ML IV BOLUS
INTRAVENOUS | Status: DC | PRN
  Administered 2016-07-30: 10 mg via INTRAVENOUS
  Administered 2016-07-30: 140 mg via INTRAVENOUS

## 2016-07-30 MED ORDER — ACETAMINOPHEN 325 MG PO TABS
650.0000 mg | ORAL_TABLET | Freq: Four times a day (QID) | ORAL | Status: DC | PRN
  Administered 2016-07-30 – 2016-07-31 (×2): 650 mg via ORAL
  Filled 2016-07-30 (×2): qty 2

## 2016-07-30 MED ORDER — DOCUSATE SODIUM 100 MG PO CAPS: 200.0000 mg | ORAL_CAPSULE | Freq: Every day | ORAL | Status: DC | PRN

## 2016-07-30 MED ORDER — LIDOCAINE 2% (20 MG/ML) 5 ML SYRINGE
INTRAMUSCULAR | Status: DC | PRN
  Administered 2016-07-30: 60 mg via INTRAVENOUS

## 2016-07-30 MED ORDER — PROPOFOL 10 MG/ML IV BOLUS
INTRAVENOUS | Status: AC
  Filled 2016-07-30: qty 20

## 2016-07-30 MED ORDER — HYDROMORPHONE HCL 1 MG/ML IJ SOLN
INTRAMUSCULAR | Status: AC
  Filled 2016-07-30: qty 1

## 2016-07-30 MED ORDER — VITAMIN B-12 1000 MCG PO TABS
500.0000 ug | ORAL_TABLET | Freq: Every day | ORAL | Status: DC
  Administered 2016-07-31: 500 ug via ORAL
  Filled 2016-07-30: qty 1

## 2016-07-30 MED ORDER — OXYCODONE HCL 5 MG PO TABS
ORAL_TABLET | ORAL | Status: AC
  Filled 2016-07-30: qty 2

## 2016-07-30 MED ORDER — EPHEDRINE SULFATE-NACL 50-0.9 MG/10ML-% IV SOSY
PREFILLED_SYRINGE | INTRAVENOUS | Status: DC | PRN
  Administered 2016-07-30: 10 mg via INTRAVENOUS

## 2016-07-30 MED ORDER — FAMOTIDINE 20 MG PO TABS
20.0000 mg | ORAL_TABLET | Freq: Every day | ORAL | Status: DC
  Administered 2016-07-30 – 2016-07-31 (×2): 20 mg via ORAL
  Filled 2016-07-30 (×2): qty 1

## 2016-07-30 MED ORDER — ONDANSETRON HCL 4 MG/2ML IJ SOLN: 4.0000 mg | Freq: Four times a day (QID) | INTRAMUSCULAR | Status: DC | PRN

## 2016-07-30 MED ORDER — METOCLOPRAMIDE HCL 5 MG PO TABS: 5.0000 mg | ORAL_TABLET | Freq: Three times a day (TID) | ORAL | Status: DC | PRN

## 2016-07-30 MED ORDER — CHLORHEXIDINE GLUCONATE 4 % EX LIQD: 60.0000 mL | Freq: Once | CUTANEOUS | Status: DC

## 2016-07-30 MED ORDER — 0.9 % SODIUM CHLORIDE (POUR BTL) OPTIME
TOPICAL | Status: DC | PRN
  Administered 2016-07-30: 1000 mL

## 2016-07-30 MED ORDER — ACETAMINOPHEN 500 MG PO TABS: 500.0000 mg | ORAL_TABLET | Freq: Every day | ORAL | Status: DC | PRN

## 2016-07-30 MED ORDER — FERROUS SULFATE 325 (65 FE) MG PO TABS
325.0000 mg | ORAL_TABLET | ORAL | Status: DC
  Administered 2016-07-30: 325 mg via ORAL
  Filled 2016-07-30: qty 1

## 2016-07-30 MED ORDER — HYDROMORPHONE HCL 1 MG/ML IJ SOLN
0.2500 mg | INTRAMUSCULAR | Status: DC | PRN
  Administered 2016-07-30 (×2): 0.5 mg via INTRAVENOUS

## 2016-07-30 MED ORDER — HYDROCHLOROTHIAZIDE 25 MG PO TABS
25.0000 mg | ORAL_TABLET | Freq: Every day | ORAL | Status: DC
  Administered 2016-07-31: 25 mg via ORAL
  Filled 2016-07-30: qty 1

## 2016-07-30 MED ORDER — LACTATED RINGERS IV SOLN
INTRAVENOUS | Status: DC
Start: 2016-07-30 — End: 2016-07-30
  Administered 2016-07-30: 09:00:00 via INTRAVENOUS

## 2016-07-30 MED ORDER — SIMVASTATIN 10 MG PO TABS
10.0000 mg | ORAL_TABLET | Freq: Every day | ORAL | Status: DC
  Administered 2016-07-30 (×2): 10 mg via ORAL
  Filled 2016-07-30: qty 1

## 2016-07-30 MED ORDER — FENTANYL CITRATE (PF) 100 MCG/2ML IJ SOLN
INTRAMUSCULAR | Status: DC | PRN
  Administered 2016-07-30: 25 ug via INTRAVENOUS
  Administered 2016-07-30: 50 ug via INTRAVENOUS
  Administered 2016-07-30: 25 ug via INTRAVENOUS

## 2016-07-30 MED ORDER — FENTANYL CITRATE (PF) 250 MCG/5ML IJ SOLN
INTRAMUSCULAR | Status: AC
  Filled 2016-07-30: qty 5

## 2016-07-30 MED ORDER — MIDAZOLAM HCL 2 MG/2ML IJ SOLN
INTRAMUSCULAR | Status: AC
  Filled 2016-07-30: qty 2

## 2016-07-30 MED ORDER — METOCLOPRAMIDE HCL 5 MG/ML IJ SOLN: 5.0000 mg | Freq: Three times a day (TID) | INTRAMUSCULAR | Status: DC | PRN

## 2016-07-30 MED ORDER — ASPIRIN EC 81 MG PO TBEC
81.0000 mg | DELAYED_RELEASE_TABLET | Freq: Every day | ORAL | Status: DC
  Administered 2016-07-31: 81 mg via ORAL
  Filled 2016-07-30: qty 1

## 2016-07-30 MED ORDER — DEXAMETHASONE SODIUM PHOSPHATE 10 MG/ML IJ SOLN
INTRAMUSCULAR | Status: DC | PRN
  Administered 2016-07-30: 10 mg via INTRAVENOUS

## 2016-07-30 MED ORDER — MIDAZOLAM HCL 5 MG/5ML IJ SOLN
INTRAMUSCULAR | Status: DC | PRN
  Administered 2016-07-30: 1 mg via INTRAVENOUS

## 2016-07-30 MED ORDER — ROCURONIUM BROMIDE 10 MG/ML (PF) SYRINGE
PREFILLED_SYRINGE | INTRAVENOUS | Status: AC
  Filled 2016-07-30: qty 5

## 2016-07-30 MED ORDER — OXYCODONE HCL 5 MG PO TABS
5.0000 mg | ORAL_TABLET | ORAL | Status: DC | PRN
  Administered 2016-07-30: 5 mg via ORAL
  Administered 2016-07-30: 10 mg via ORAL
  Administered 2016-07-31: 5 mg via ORAL
  Filled 2016-07-30: qty 1
  Filled 2016-07-30: qty 2

## 2016-07-30 MED ORDER — MORPHINE SULFATE (PF) 4 MG/ML IV SOLN: 2.0000 mg | INTRAVENOUS | Status: DC | PRN

## 2016-07-30 MED ORDER — RAMIPRIL 2.5 MG PO CAPS
2.5000 mg | ORAL_CAPSULE | Freq: Every day | ORAL | Status: DC
  Administered 2016-07-31: 2.5 mg via ORAL
  Filled 2016-07-30 (×2): qty 1

## 2016-07-30 MED ORDER — TRAMADOL HCL 50 MG PO TABS
50.0000 mg | ORAL_TABLET | Freq: Four times a day (QID) | ORAL | Status: DC
  Administered 2016-07-31: 50 mg via ORAL
  Filled 2016-07-30 (×2): qty 1

## 2016-07-30 MED ORDER — ACETAMINOPHEN 650 MG RE SUPP: 650.0000 mg | Freq: Four times a day (QID) | RECTAL | Status: DC | PRN

## 2016-07-30 SURGICAL SUPPLY — 58 items
APL SKNCLS STERI-STRIP NONHPOA (GAUZE/BANDAGES/DRESSINGS) ×1
BANDAGE ACE 4X5 VEL STRL LF (GAUZE/BANDAGES/DRESSINGS) IMPLANT
BENZOIN TINCTURE PRP APPL 2/3 (GAUZE/BANDAGES/DRESSINGS) ×3 IMPLANT
BIT DRILL 100X2XQC STRL (BIT) IMPLANT
BIT DRILL QC 2.0X100 (BIT) ×3
BIT DRL 100X2XQC STRL (BIT) ×1
BLADE CLIPPER SURG (BLADE) IMPLANT
BNDG COHESIVE 4X5 TAN STRL (GAUZE/BANDAGES/DRESSINGS) ×2 IMPLANT
BNDG CONFORM 3 STRL LF (GAUZE/BANDAGES/DRESSINGS) IMPLANT
BNDG ELASTIC 2X5.8 VLCR STR LF (GAUZE/BANDAGES/DRESSINGS) IMPLANT
CLOSURE WOUND 1/2 X4 (GAUZE/BANDAGES/DRESSINGS) ×1
CORDS BIPOLAR (ELECTRODE) ×3 IMPLANT
COVER SURGICAL LIGHT HANDLE (MISCELLANEOUS) ×3 IMPLANT
CUFF TOURNIQUET SINGLE 18IN (TOURNIQUET CUFF) IMPLANT
CUFF TOURNIQUET SINGLE 24IN (TOURNIQUET CUFF) IMPLANT
DRAPE OEC MINIVIEW 54X84 (DRAPES) ×3 IMPLANT
DRSG EMULSION OIL 3X3 NADH (GAUZE/BANDAGES/DRESSINGS) IMPLANT
DURAPREP 26ML APPLICATOR (WOUND CARE) ×3 IMPLANT
ELECT REM PT RETURN 9FT ADLT (ELECTROSURGICAL)
ELECTRODE REM PT RTRN 9FT ADLT (ELECTROSURGICAL) IMPLANT
GAUZE SPONGE 4X4 12PLY STRL (GAUZE/BANDAGES/DRESSINGS) ×2 IMPLANT
GAUZE XEROFORM 1X8 LF (GAUZE/BANDAGES/DRESSINGS) ×2 IMPLANT
GLOVE BIOGEL PI IND STRL 8 (GLOVE) ×2 IMPLANT
GLOVE BIOGEL PI INDICATOR 8 (GLOVE) ×4
GLOVE ORTHO TXT STRL SZ7.5 (GLOVE) ×6 IMPLANT
GOWN STRL REUS W/ TWL LRG LVL3 (GOWN DISPOSABLE) ×1 IMPLANT
GOWN STRL REUS W/ TWL XL LVL3 (GOWN DISPOSABLE) ×1 IMPLANT
GOWN STRL REUS W/TWL 2XL LVL3 (GOWN DISPOSABLE) ×3 IMPLANT
GOWN STRL REUS W/TWL LRG LVL3 (GOWN DISPOSABLE) ×3
GOWN STRL REUS W/TWL XL LVL3 (GOWN DISPOSABLE) ×3
K-WIRE 1.25 TRCR POINT 150 (WIRE) ×3
KIT BASIN OR (CUSTOM PROCEDURE TRAY) ×3 IMPLANT
KIT ROOM TURNOVER OR (KITS) ×3 IMPLANT
KWIRE 1.25 TRCR POINT 150 (WIRE) IMPLANT
MANIFOLD NEPTUNE II (INSTRUMENTS) ×3 IMPLANT
NS IRRIG 1000ML POUR BTL (IV SOLUTION) ×3 IMPLANT
PACK ORTHO EXTREMITY (CUSTOM PROCEDURE TRAY) ×3 IMPLANT
PAD ARMBOARD 7.5X6 YLW CONV (MISCELLANEOUS) ×6 IMPLANT
PAD CAST 4YDX4 CTTN HI CHSV (CAST SUPPLIES) IMPLANT
PADDING CAST COTTON 4X4 STRL (CAST SUPPLIES) ×3
PADDING CAST COTTON 6X4 STRL (CAST SUPPLIES) ×2 IMPLANT
PROS ST MINI PL 5HX29MM (Orthopedic Implant) ×2 IMPLANT
SCREW CORT ST 2.7X10 (Screw) ×2 IMPLANT
SCREW CORTEX 2.7X12MM (Screw) ×6 IMPLANT
SCREW CORTEX 2.7X14MM (Screw) ×2 IMPLANT
SCREW CORTEX 2.7X16MM (Screw) ×4 IMPLANT
SPECIMEN JAR SMALL (MISCELLANEOUS) ×3 IMPLANT
STRIP CLOSURE SKIN 1/2X4 (GAUZE/BANDAGES/DRESSINGS) ×2 IMPLANT
SUCTION FRAZIER HANDLE 10FR (MISCELLANEOUS)
SUCTION TUBE FRAZIER 10FR DISP (MISCELLANEOUS) IMPLANT
SUT ETHILON 4 0 P 3 18 (SUTURE) IMPLANT
SUT ETHILON 5 0 P 3 18 (SUTURE)
SUT NYLON ETHILON 5-0 P-3 1X18 (SUTURE) IMPLANT
TOWEL OR 17X24 6PK STRL BLUE (TOWEL DISPOSABLE) ×3 IMPLANT
TOWEL OR 17X26 10 PK STRL BLUE (TOWEL DISPOSABLE) ×3 IMPLANT
TUBE CONNECTING 12'X1/4 (SUCTIONS)
TUBE CONNECTING 12X1/4 (SUCTIONS) IMPLANT
WATER STERILE IRR 1000ML POUR (IV SOLUTION) ×3 IMPLANT

## 2016-07-30 NOTE — Transfer of Care (Signed)
Immediate Anesthesia Transfer of Care Note  Patient: [name]  Procedure(s) Performed: Procedure(s): OPEN REDUCTION INTERNAL FIXATION (ORIF) RIGHT 2ND METATARSAL (TOE) FRACTURE (Right)  Patient Location: PACU  Anesthesia Type:General  Level of Consciousness: awake, oriented and patient cooperative  Airway & Oxygen Therapy: Patient Spontanous Breathing and Patient connected to nasal cannula oxygen  Post-op Assessment: Report given to RN and Post -op Vital signs reviewed and stable  Post vital signs: Reviewed and stable  Last Vitals:  Vitals:   07/30/16 0918  BP: 140/68  Pulse: 69  Resp: 20  Temp: 36.6 C    Last Pain:  Vitals:   07/30/16 0918  TempSrc: Oral      Patients Stated Pain Goal: 3 (93/11/21 6244)  Complications: No apparent anesthesia complications

## 2016-07-30 NOTE — Progress Notes (Signed)
Notified Dr. Lorin Mercy of patient taking aspirin and naproxen yesterday.  He stated it was okay.

## 2016-07-30 NOTE — Brief Op Note (Signed)
07/30/2016  12:56 PM  PATIENT:  [name]  71 y.o. female  PRE-OPERATIVE DIAGNOSIS:  Right 2nd Metatarsal Shaft Fracture  POST-OPERATIVE DIAGNOSIS:  Right 2nd Metatarsal Shaft Fracture  PROCEDURE:  Procedure(s): OPEN REDUCTION INTERNAL FIXATION (ORIF) RIGHT 2ND METATARSAL (TOE) FRACTURE (Right)  SURGEON:  Surgeon(s) and Role:    * [name], MD - Primary  PHYSICIAN ASSISTANT:   ASSISTANTS: none   ANESTHESIA:   general  EBL:  Total I/O In: 800 [I.V.:800] Out: 30 [Blood:30]  BLOOD ADMINISTERED:none  DRAINS: none   LOCAL MEDICATIONS USED:  NONE  SPECIMEN:  No Specimen  DISPOSITION OF SPECIMEN:  N/A  COUNTS:  YES  TOURNIQUET:  * Missing tourniquet times found for documented tourniquets in log:  [id number] *  DICTATION: .Dragon Dictation  PLAN OF CARE: Admit for overnight observation  PATIENT DISPOSITION:  PACU - hemodynamically stable.   Delay start of Pharmacological VTE agent (>24hrs) due to surgical blood loss or risk of bleeding: not applicable

## 2016-07-30 NOTE — Anesthesia Postprocedure Evaluation (Signed)
Anesthesia Post Note  Patient: [name]  Procedure(s) Performed: Procedure(s) (LRB): OPEN REDUCTION INTERNAL FIXATION (ORIF) RIGHT 2ND METATARSAL (TOE) FRACTURE (Right)     Patient location during evaluation: PACU Anesthesia Type: General Level of consciousness: awake and alert Pain management: pain level controlled Vital Signs Assessment: post-procedure vital signs reviewed and stable Respiratory status: spontaneous breathing, nonlabored ventilation, respiratory function stable and patient connected to nasal cannula oxygen Cardiovascular status: blood pressure returned to baseline and stable Postop Assessment: no signs of nausea or vomiting Anesthetic complications: no    Last Vitals:  Vitals:   07/30/16 1345 07/30/16 1400  BP: (!) 142/96 129/73  Pulse: 72 69  Resp: 12 16  Temp:  36.4 C    Last Pain:  Vitals:   07/30/16 1345  TempSrc:   PainSc: Asleep        RLE Motor Response: Responds to commands (07/30/16 1400) RLE Sensation: No tingling;No numbness (07/30/16 1400)      [place]

## 2016-07-30 NOTE — Progress Notes (Signed)
Orthopedic Tech Progress Note Patient Details:  [name] Feb 14, 1945 [id number]  Ortho Devices Type of Ortho Device: Darco shoe Ortho Device/Splint Interventions: Application   [name] 07/30/2016, 3:13 PM

## 2016-07-30 NOTE — Transfer of Care (Deleted)
Immediate Anesthesia Transfer of Care Note  Patient: [name]  Procedure(s) Performed: Procedure(s): OPEN REDUCTION INTERNAL FIXATION (ORIF) RIGHT 2ND METATARSAL (TOE) FRACTURE (Right)  Patient Location: PACU  Anesthesia Type:MAC  Level of Consciousness: awake, oriented and patient cooperative  Airway & Oxygen Therapy: Patient Spontanous Breathing and Patient connected to nasal cannula oxygen  Post-op Assessment: Report given to RN and Post -op Vital signs reviewed and stable  Post vital signs: Reviewed and stable  Last Vitals:  Vitals:   07/30/16 0918  BP: 140/68  Pulse: 69  Resp: 20  Temp: 36.6 C    Last Pain:  Vitals:   07/30/16 0918  TempSrc: Oral      Patients Stated Pain Goal: 3 (49/44/96 7591)  Complications: No apparent anesthesia complications

## 2016-07-30 NOTE — Op Note (Signed)
Preop diagnosis: Right second metatarsal shaft fracture, angulated and displaced, closed.  Postop diagnosis: Same  Procedure: ORIF left second metatarsal shaft fracture.  Surgeon Rodell Perna M.D.  Assistant none  Anesthesia general  Tourniquet time approximately 32 minutes 250, calf tourniquet  Implants Synthes the mini frag plate with the bicortical screws 5  Procedure standard prepping and draping timeout procedure preoperative Ancef prophylaxis sterile skin [name] was used on the foot and usual extremity sheets and drapes were used. Incision was made directly over the angulated displaced shaft fracture. Blunt dissection mobilization to improve preserve the superficial peroneal nerve branches were was performed. Extensor tendon was mobilized and preserved. Periosteum was opened the fracture appeared to the be older than 1-2 weeks. Fractured be taken down and mobilized in order to realign the proximal distal shaft fracture and make the fracture back in straight alignment. K wire was used for pinning of this since it wanted to spring back with the apex dorsal angulation. Mini frag plate Synthes stainless steel was selected and then the 2.7 screws placed 5 checking fluoroscopy pictures intermittently make sure screws were in good position alignment. Final spot pictures are taken wound was irrigated tourniquet deflated and skin closed with 3-0 nylon interrupted sutures. Xeroform 4 x 4's Webb roll and Coban was applied for postoperative dressing.

## 2016-07-30 NOTE — Anesthesia Preprocedure Evaluation (Addendum)
Anesthesia Evaluation  Patient identified by MRN, date of birth, ID band Patient awake    Reviewed: Allergy & Precautions, H&P , NPO status , Patient's Chart, lab work & pertinent test results  Airway Mallampati: III  TM Distance: >3 FB Neck ROM: Full    Dental no notable dental hx. (+) Teeth Intact, Dental Advisory Given   Pulmonary sleep apnea and Continuous Positive Airway Pressure Ventilation , former smoker,    Pulmonary exam normal breath sounds clear to auscultation       Cardiovascular hypertension, Pt. on medications  Rhythm:Regular Rate:Normal     Neuro/Psych TIAnegative psych ROS   GI/Hepatic Neg liver ROS, GERD  Medicated and Controlled,  Endo/Other  negative endocrine ROS  Renal/GU negative Renal ROS  negative genitourinary   Musculoskeletal  (+) Arthritis , Rheumatoid disorders,    Abdominal   Peds  Hematology negative hematology ROS (+) anemia ,   Anesthesia Other Findings   Reproductive/Obstetrics negative OB ROS                            Anesthesia Physical Anesthesia Plan  ASA: III  Anesthesia Plan: General   Post-op Pain Management:    Induction: Intravenous  PONV Risk Score and Plan: 4 or greater and Ondansetron, Dexamethasone and Midazolam  Airway Management Planned: LMA  Additional Equipment:   Intra-op Plan:   Post-operative Plan: Extubation in OR  Informed Consent: I have reviewed the patients History and Physical, chart, labs and discussed the procedure including the risks, benefits and alternatives for the proposed anesthesia with the patient or authorized representative who has indicated his/her understanding and acceptance.   Dental advisory given  Plan Discussed with: CRNA  Anesthesia Plan Comments:        Anesthesia Quick Evaluation

## 2016-07-30 NOTE — Interval H&P Note (Signed)
History and Physical Interval Note:  07/30/2016 11:39 AM  [name]  has presented today for surgery, with the diagnosis of Right 2nd Metatarsal Shaft Fracture  The various methods of treatment have been discussed with the patient and family. After consideration of risks, benefits and other options for treatment, the patient has consented to  Procedure(s): OPEN REDUCTION INTERNAL FIXATION (ORIF) RIGHT 2ND METATARSAL (TOE) FRACTURE (Right) as a surgical intervention .  The patient's history has been reviewed, patient examined, no change in status, stable for surgery.  I have reviewed the patient's chart and labs.  Questions were answered to the patient's satisfaction.     [name]

## 2016-07-30 NOTE — H&P (Signed)
[name] is an 71 y.o. female.   Chief Complaint: right 2nd metatarsal fracture HPI: patient with above complaint presents for surgical intervention.    Past Medical History:  Diagnosis Date  . Anemia   . Arthritis    Rheumatoid, followed by Dr. Estanislado Pandy, knees & ankles   . CHF (congestive heart failure) ([place])    pt not aware of this  . Constipation   . GERD (gastroesophageal reflux disease)   . H/O cardiovascular stress test 02/2014   seen by Dr. Einar Gip - told that she was cleared for surgery  . Heart murmur    states she's never had any problems  . Hypertension   . Leg cramps   . Shortness of breath dyspnea    with exertion  . Sleep apnea    CPAP in use- QO night, study done at [facility]- 2007   . Stroke [facility]) 03/2015   TIA    Past Surgical History:  Procedure Laterality Date  . ABDOMINAL HYSTERECTOMY    . CARPAL TUNNEL RELEASE Right   . COLONOSCOPY    . FOOT SURGERY Bilateral    bunionectomy, calluses , also has several pins & screws  . HAND SURGERY    . JOINT REPLACEMENT     planned for 03/25/2014  . KNEE SURGERY  1990's   . LUMBAR LAMINECTOMY/DECOMPRESSION MICRODISCECTOMY N/A 01/17/2015   Procedure: L2-3, L3-4 Decompression;  Surgeon: [name], MD;  Location: [place];  Service: Orthopedics;  Laterality: N/A;  . LUMBAR LAMINECTOMY/DECOMPRESSION MICRODISCECTOMY N/A 02/06/2015   Procedure: I AND D LUMBAR WITH WOUND VAC PLACEMENT;  Surgeon: [name], MD;  Location: [place];  Service: Orthopedics;  Laterality: N/A;  . TOTAL KNEE ARTHROPLASTY Left 03/25/2014   Procedure: TOTAL KNEE ARTHROPLASTY;  Surgeon: Garald Balding, MD;  Location: [place];  Service: Orthopedics;  Laterality: Left;  . TUBAL LIGATION      Family History  Problem Relation Age of Onset  . Seizures Mother   . Transient ischemic attack Mother   . Hypertension Mother   . Diabetes Mother   . Congestive Heart Failure Mother   . Arthritis/Rheumatoid Mother   . Parkinson's disease Mother   .  Dementia Mother   . Heart disease Father        CHF  . Stroke Sister    Social History:  reports that she quit smoking about 36 years ago. She has never used smokeless tobacco. She reports that she does not drink alcohol or use drugs.  Allergies:  Allergies  Allergen Reactions  . No Known Allergies     No prescriptions prior to admission.    No results found for this or any previous visit (from the past 48 hour(s)). No results found.  Review of Systems  Constitutional: Negative.   HENT: Negative.   Respiratory: Negative.   Cardiovascular: Negative.   Gastrointestinal: Negative.   Genitourinary: Negative.   Musculoskeletal: Positive for joint pain.  Skin: Negative.   Neurological: Negative.   Psychiatric/Behavioral: Negative.     There were no vitals taken for this visit. Physical Exam  Constitutional: She is oriented to person, place, and time. No distress.  HENT:  Head: Normocephalic and atraumatic.  Eyes: Pupils are equal, round, and reactive to light. EOM are normal.  Neck: Normal range of motion.  Respiratory: No respiratory distress.  GI: She exhibits no distension.  Musculoskeletal: She exhibits tenderness.  Neurological: She is alert and oriented to person, place, and time.  Skin:  Skin is warm and dry.  Psychiatric: She has a normal mood and affect.     Assessment/Plan Right 2nd metatarsal fracture  Will proceed withOPEN REDUCTION INTERNAL FIXATION (ORIF) RIGHT 2ND METATARSAL (TOE) FRACTURE as scheduled.  Procedure along with possible recovery time discussed.  All questions answered and wishes to proceed.   Benjiman Core, PA-C 07/30/2016, 7:01 AM

## 2016-07-30 NOTE — Anesthesia Procedure Notes (Signed)
Procedure Name: LMA Insertion Date/Time: 07/30/2016 11:54 AM Performed by: Willeen Cass P Pre-anesthesia Checklist: Patient identified, Emergency Drugs available, Suction available and Patient being monitored Patient Re-evaluated:Patient Re-evaluated prior to induction Oxygen Delivery Method: Circle System Utilized Preoxygenation: Pre-oxygenation with 100% oxygen Induction Type: IV induction Ventilation: Mask ventilation without difficulty LMA: LMA inserted LMA Size: 4.0 Number of attempts: 2 Placement Confirmation: positive ETCO2 Tube secured with: Tape Dental Injury: Teeth and Oropharynx as per pre-operative assessment

## 2016-07-31 ENCOUNTER — Encounter (HOSPITAL_COMMUNITY): Payer: Self-pay | Admitting: Orthopaedic Surgery

## 2016-07-31 DIAGNOSIS — S92321A Displaced fracture of second metatarsal bone, right foot, initial encounter for closed fracture: Secondary | ICD-10-CM | POA: Diagnosis not present

## 2016-07-31 MED ORDER — HYDROCODONE-ACETAMINOPHEN 10-325 MG PO TABS: 1.0000 | ORAL_TABLET | Freq: Four times a day (QID) | ORAL | 0 refills | Status: DC | PRN

## 2016-07-31 NOTE — Progress Notes (Signed)
Subjective: Doing well.  Pain controlled. Wants to go home.    Objective: Vital signs in last 24 hours: Temp:  [97.6 F (36.4 C)-98.6 F (37 C)] 98.1 F (36.7 C) (07/24 0623) Pulse Rate:  [67-75] 67 (07/24 0623) Resp:  [8-18] 18 (07/24 0623) BP: (97-152)/(42-96) 101/61 (07/24 0623) SpO2:  [96 %-100 %] 100 % (07/24 0623)  Intake/Output from previous day: 07/23 0701 - 07/24 0700 In: 1657.5 [P.O.:540; I.V.:1117.5] Out: 30 [Blood:30] Intake/Output this shift: Total I/O In: 120 [P.O.:120] Out: -    Recent Labs  07/30/16 0900  HGB 11.3*    Recent Labs  07/30/16 0900  WBC 7.8  RBC 3.96  HCT 35.6*  PLT 264    Recent Labs  07/30/16 0900  NA 141  K 4.2  CL 106  CO2 24  BUN 22*  CREATININE 1.20*  GLUCOSE 81  CALCIUM 9.0   No results for input(s): LABPT, INR in the last 72 hours.  Exam:  Alert and oriented.  Dressing c/d/i. Moves toes well.    Assessment/Plan: D/c home today after walks with PT.  wbat on heel in darco postop shoe.  Script norco on chart.    [name] 07/31/2016, 1:10 PM

## 2016-07-31 NOTE — Discharge Instructions (Signed)
Weight bear on heel only in postop shoe  Do not remove dressing or get foot wet.    Elevate foot above heart level as much as possible.    No driving.

## 2016-07-31 NOTE — Progress Notes (Signed)
Pt eager for discharge and declined working with PT another time (stated "I thought our session this morning went well and she told me everything I need to know with walking and going up stairs. I'm good").   Education and instructions reviewed with pt and spouse, and all questions/concerns addressed. IV removed and belongings gathered. Pt will be transported out via wheelchair to husband's vehicle.

## 2016-07-31 NOTE — Evaluation (Signed)
Physical Therapy Evaluation Patient Details Name: [name] MRN: [id number] DOB: [birth year] Today's Date: 07/31/2016   History of Present Illness  Pt is a 71 y/o female s/p ORIF R 2nd metatarsal secondary to fx. PMH including but not limited to CHF, HTN, hx of TIA in 2017, L TKA in 2016 and lumbar laminectomies in 2017.  Clinical Impression  Pt presented sitting EOB, awake and willing to participate in therapy session. Pt requesting assistance to transfer to [facility] to void. Prior to admission, pt reported that she was independent with ADLs and ambulated with use of SPC. Pt lives with her husband and son who will be able to provide 24/7 supervision if necessary. Pt performed sit<>stand and stand-pivot transfer with use of RW and min guard for safety. Pt had DARCO shoe donned to R foot throughout session. No WB'ing orders in place during time of evaluation, therefore further gait training was not assessed. However, later on after evaluation pt's RN reported that pt is WBAT R LE with DARCO shoe. PT will continue to follow pt acutely to ensure a safe d/c home. Will plan for further gait assessment and training at next session.     Follow Up Recommendations No PT follow up;Supervision/Assistance - 24 hour    Equipment Recommendations  None recommended by PT    Recommendations for Other Services       Precautions / Restrictions Precautions Precautions: Fall Restrictions Weight Bearing Restrictions: Yes RLE Weight Bearing: Weight bearing as tolerated Other Position/Activity Restrictions: with DARCO shoe      Mobility  Bed Mobility Overal bed mobility: Modified Independent                Transfers Overall transfer level: Needs assistance Equipment used: Rolling walker (2 wheeled) Transfers: Sit to/from [facility] Sit to Stand: Min guard Stand pivot transfers: Min guard       General transfer comment: increased time, good technique, steady with use of RW,  min guard for safety only, no physical assistance required  Ambulation/Gait                Stairs            Wheelchair Mobility    Modified Rankin (Stroke Patients Only)       Balance Overall balance assessment: Needs assistance Sitting-balance support: Feet supported Sitting balance-Leahy Scale: Good     Standing balance support: During functional activity;Bilateral upper extremity supported;Single extremity supported Standing balance-Leahy Scale: Poor Standing balance comment: pt reliant on at least one UE support                             Pertinent Vitals/Pain Pain Assessment: No/denies pain    Home Living Family/patient expects to be discharged to:: Private residence Living Arrangements: Spouse/significant other Available Help at Discharge: Family;Available 24 hours/day Type of Home: House Home Access: Stairs to enter Entrance Stairs-Rails: Right Entrance Stairs-Number of Steps: 2 Home Layout: One level Home Equipment: Walker - 2 wheels;Walker - 4 wheels;Cane - single point;Bedside commode;Shower seat      Prior Function Level of Independence: Independent with assistive device(s)         Comments: pt ambulates with SPC     Hand Dominance   Dominant Hand: Right    Extremity/Trunk Assessment   Upper Extremity Assessment Upper Extremity Assessment: Overall WFL for tasks assessed    Lower Extremity Assessment Lower Extremity Assessment: Overall WFL for tasks assessed;RLE deficits/detail RLE  Deficits / Details: pt with slightly diminished sensation to light touch on all toes. Pt able to flex/extend toes and perform ankle DF/PF well.       Communication   Communication: No difficulties  Cognition Arousal/Alertness: Awake/alert Behavior During Therapy: WFL for tasks assessed/performed Overall Cognitive Status: Within Functional Limits for tasks assessed                                        General  Comments      Exercises     Assessment/Plan    PT Assessment Patient needs continued PT services  PT Problem List Decreased balance;Decreased mobility;Decreased coordination;Decreased knowledge of use of DME;Decreased knowledge of precautions;Decreased safety awareness       PT Treatment Interventions DME instruction;Gait training;Stair training;Functional mobility training;Therapeutic activities;Therapeutic exercise;Balance training;Neuromuscular re-education;Patient/family education    PT Goals (Current goals can be found in the Care Plan section)  Acute Rehab PT Goals Patient Stated Goal: return home PT Goal Formulation: With patient/family Time For Goal Achievement: 08/14/16 Potential to Achieve Goals: Good    Frequency Min 3X/week   Barriers to discharge        Co-evaluation               AM-PAC PT "6 Clicks" Daily Activity  Outcome Measure Difficulty turning over in bed (including adjusting bedclothes, sheets and blankets)?: None Difficulty moving from lying on back to sitting on the side of the bed? : None Difficulty sitting down on and standing up from a chair with arms (e.g., wheelchair, bedside commode, etc,.)?: Total Help needed moving to and from a bed to chair (including a wheelchair)?: A Little Help needed walking in hospital room?: A Little Help needed climbing 3-5 steps with a railing? : A Little 6 Click Score: 18    End of Session Equipment Utilized During Treatment: Gait belt Activity Tolerance: Patient tolerated treatment well Patient left: in bed;with call bell/phone within reach;with bed alarm set;with family/visitor present Nurse Communication: Mobility status;Weight bearing status;Other (comment) (at time of eval no WB'ing orders in place) PT Visit Diagnosis: Other abnormalities of gait and mobility (R26.89)    Time: 7096-2836 PT Time Calculation (min) (ACUTE ONLY): 17 min   Charges:   PT Evaluation $PT Eval Moderate Complexity: 1  Procedure     PT G Codes:   PT G-Codes **NOT FOR INPATIENT CLASS** Functional Assessment Tool Used: AM-PAC 6 Clicks Basic Mobility;Clinical judgement Functional Limitation: Mobility: Walking and moving around Mobility: Walking and Moving Around Current Status (O2947): At least 40 percent but less than 60 percent impaired, limited or restricted Mobility: Walking and Moving Around Goal Status [phone]): At least 1 percent but less than 20 percent impaired, limited or restricted    [facility], [place], DPT [place] 07/31/2016, 12:10 PM

## 2016-08-07 ENCOUNTER — Ambulatory Visit (INDEPENDENT_AMBULATORY_CARE_PROVIDER_SITE_OTHER): Payer: Medicare Other | Admitting: Orthopaedic Surgery

## 2016-08-07 ENCOUNTER — Ambulatory Visit (INDEPENDENT_AMBULATORY_CARE_PROVIDER_SITE_OTHER): Payer: Medicare Other

## 2016-08-07 ENCOUNTER — Encounter (INDEPENDENT_AMBULATORY_CARE_PROVIDER_SITE_OTHER): Payer: Self-pay | Admitting: Orthopaedic Surgery

## 2016-08-07 DIAGNOSIS — S92321A Displaced fracture of second metatarsal bone, right foot, initial encounter for closed fracture: Secondary | ICD-10-CM | POA: Diagnosis not present

## 2016-08-07 NOTE — Progress Notes (Signed)
Post-Op Visit Note   Patient: [name]           Date of Birth: [birth year]           MRN: [id number] Visit Date: 08/07/2016 PCP: Iona Beard, MD   Assessment & Plan: Post op second metatarsal ORIF for fracture with malunion. Exact date for her injury was unknown. Incision was good return in 1 week for possible suture removal.  Chief Complaint:  Chief Complaint  Patient presents with  . Right Foot - Routine Post Op   Visit Diagnoses:  1. Displaced fracture of second metatarsal bone, right foot, initial encounter for closed fracture     Plan: Return 1 week for wound check and possible suture removal.  Follow-Up Instructions: No Follow-up on file.   Orders:  Orders Placed This Encounter  Procedures  . XR Foot Complete Right   No orders of the defined types were placed in this encounter.   Imaging: Xr Foot Complete Right  Result Date: 08/07/2016 AP lateral right foot x-rays obtained and reviewed that shows good position alignment post-ORIF second metatarsal fracture Impression: Post ORIF second metatarsal in good position and alignment.   PMFS History: Patient Active Problem List   Diagnosis Date Noted  . Closed fracture of shaft of metatarsal bone 07/30/2016  . Metatarsal fracture 07/30/2016  . Displaced fracture of second metatarsal bone, right foot, initial encounter for closed fracture 07/26/2016  . Arthritis of shoulder 04/10/2016  . High risk medication use 03/16/2016  . Primary osteoarthritis of both feet 03/16/2016  . Primary osteoarthritis of both knees 03/16/2016  . Spondylosis of lumbar region 03/16/2016  . OSA on CPAP 07/27/2015  . Lip numbness 03/19/2015  . TIA (transient ischemic attack) 03/19/2015  . 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 Post op infection 02/05/2015  . History of lumbar laminectomy for spinal cord decompression 01/17/2015  . Lumbar spinal stenosis 01/17/2015  . Primary osteoarthritis of left knee 03/25/2014  .  Rheumatoid arthritis of multiple sites with negative rheumatoid factor ([place]) 03/25/2014  . History of total knee replacement, left 03/25/2014  . DERMATITIS 09/28/2008  . CONSTIPATION 05/25/2008  . DYSPNEA ON EXERTION 05/25/2008  . ANEMIA, NORMOCYTIC 04/13/2008  . ANKLE PAIN, BILATERAL 11/25/2007  . HLD (hyperlipidemia) 08/14/2006  . Morbid obesity ([place]) 12/13/2005  . CARPAL TUNNEL SYNDROME 12/13/2005  . PERIPHERAL NEUROPATHY 12/13/2005  . Essential hypertension 12/13/2005  . DYSRHYTHMIA, CARDIAC NOS 12/13/2005   Past Medical History:  Diagnosis Date  . Anemia   . Arthritis    Rheumatoid, followed by Dr. Estanislado Pandy, knees & ankles   . CHF (congestive heart failure) ([place])    pt not aware of this  . Constipation   . GERD (gastroesophageal reflux disease)   . H/O cardiovascular stress test 02/2014   seen by Dr. Einar Gip - told that she was cleared for surgery  . Heart murmur    states she's never had any problems  . Hypertension   . Leg cramps   . Shortness of breath dyspnea    with exertion  . Sleep apnea    CPAP in use- QO night, study done at [facility]- 2007   . Stroke [facility]) 03/2015   TIA    Family History  Problem Relation Age of Onset  . Seizures Mother   . Transient ischemic attack Mother   . Hypertension Mother   . Diabetes Mother   . Congestive Heart Failure Mother   . Arthritis/Rheumatoid Mother   . Parkinson's  disease Mother   . Dementia Mother   . Heart disease Father        CHF  . Stroke Sister     Past Surgical History:  Procedure Laterality Date  . ABDOMINAL HYSTERECTOMY    . CARPAL TUNNEL RELEASE Right   . COLONOSCOPY    . FOOT SURGERY Bilateral    bunionectomy, calluses , also has several pins & screws  . HAND SURGERY    . JOINT REPLACEMENT     planned for 03/25/2014  . KNEE SURGERY  1990's   . LUMBAR LAMINECTOMY/DECOMPRESSION MICRODISCECTOMY N/A 01/17/2015   Procedure: L2-3, L3-4 Decompression;  Surgeon: [name], MD;  Location: [place];  Service:  Orthopedics;  Laterality: N/A;  . LUMBAR LAMINECTOMY/DECOMPRESSION MICRODISCECTOMY N/A 02/06/2015   Procedure: I AND D LUMBAR WITH WOUND VAC PLACEMENT;  Surgeon: [name], MD;  Location: [place];  Service: Orthopedics;  Laterality: N/A;  . ORIF TOE FRACTURE Right 07/30/2016   Procedure: OPEN REDUCTION INTERNAL FIXATION (ORIF) RIGHT 2ND METATARSAL (TOE) FRACTURE;  Surgeon: [name], MD;  Location: [place];  Service: Orthopedics;  Laterality: Right;  . TOTAL KNEE ARTHROPLASTY Left 03/25/2014   Procedure: TOTAL KNEE ARTHROPLASTY;  Surgeon: Garald Balding, MD;  Location: [place];  Service: Orthopedics;  Laterality: Left;  . TUBAL LIGATION     Social History   Occupational History  . Not on file.   Social History Main Topics  . Smoking status: Former Smoker    Quit date: 03/16/1980  . Smokeless tobacco: Never Used  . Alcohol use No  . Drug use: No  . Sexual activity: Not on file

## 2016-08-09 NOTE — Discharge Summary (Signed)
Patient ID: [name] MRN: [id number] DOB/AGE: Nov 16, 1945 71 y.o.  Admit date: 07/30/2016 Discharge date: 08/09/2016  Admission Diagnoses:  Active Problems:   Closed fracture of shaft of metatarsal bone   Metatarsal fracture   Discharge Diagnoses:  Active Problems:   Closed fracture of shaft of metatarsal bone   Metatarsal fracture  status post Procedure(s): OPEN REDUCTION INTERNAL FIXATION (ORIF) RIGHT 2ND METATARSAL (TOE) FRACTURE  Past Medical History:  Diagnosis Date  . Anemia   . Arthritis    Rheumatoid, followed by Dr. Estanislado Pandy, knees & ankles   . CHF (congestive heart failure) ([place])    pt not aware of this  . Constipation   . GERD (gastroesophageal reflux disease)   . H/O cardiovascular stress test 02/2014   seen by Dr. Einar Gip - told that she was cleared for surgery  . Heart murmur    states she's never had any problems  . Hypertension   . Leg cramps   . Shortness of breath dyspnea    with exertion  . Sleep apnea    CPAP in use- QO night, study done at [facility]- 2007   . Stroke ([place]) 03/2015   TIA    Surgeries: Procedure(s): OPEN REDUCTION INTERNAL FIXATION (ORIF) RIGHT 2ND METATARSAL (TOE) FRACTURE on 07/30/2016   Consultants:   Discharged Condition: Improved  Hospital Course: [name] is an 71 y.o. female who was admitted 07/30/2016 for operative treatment of metatarsal fracture. Patient failed conservative treatments (please see the history and physical for the specifics) and had severe unremitting pain that affects sleep, daily activities and work/hobbies. After pre-op clearance, the patient was taken to the operating room on 07/30/2016 and underwent  Procedure(s): OPEN REDUCTION INTERNAL FIXATION (ORIF) RIGHT 2ND METATARSAL (TOE) FRACTURE.    Patient was given perioperative antibiotics:  Anti-infectives    Start     Dose/Rate Route Frequency Ordered Stop   07/30/16 1000  ceFAZolin (ANCEF) IVPB 2g/100 mL premix     2 g 200 mL/hr over 30  Minutes Intravenous To ShortStay Surgical 07/27/16 1042 07/30/16 1200       Patient was given sequential compression devices and early ambulation to prevent DVT.   Patient benefited maximally from hospital stay and there were no complications. At the time of discharge, the patient was urinating/moving their bowels without difficulty, tolerating a regular diet, pain is controlled with oral pain medications and they have been cleared by PT/OT.   Recent vital signs: No data found.    Recent laboratory studies: No results for input(s): WBC, HGB, HCT, PLT, NA, K, CL, CO2, BUN, CREATININE, GLUCOSE, INR, CALCIUM in the last 72 hours.  Invalid input(s): PT, 2   Discharge Medications:   Allergies as of 07/31/2016      Reactions   No Known Allergies       Medication List    STOP taking these medications   acetaminophen 500 MG tablet Commonly known as:  TYLENOL   naproxen 500 MG tablet Commonly known as:  NAPROSYN   traMADol 50 MG tablet Commonly known as:  ULTRAM     TAKE these medications   aspirin EC 81 MG tablet Take 81 mg by mouth daily.   cyanocobalamin 500 MCG tablet Take 500 mcg by mouth daily.   ferrous sulfate 325 (65 FE) MG EC tablet Take 325 mg by mouth every other day.   HUMIRA PEN 40 MG/0.8ML Pnkt Generic drug:  Adalimumab INJECT 40MG  EVERY 14 DAYS   hydrochlorothiazide 25 MG  tablet Commonly known as:  HYDRODIURIL Take 25 mg by mouth daily.   HYDROcodone-acetaminophen 10-325 MG tablet Commonly known as:  NORCO Take 1 tablet by mouth every 6 (six) hours as needed.   ramipril 2.5 MG capsule Commonly known as:  ALTACE Take 2.5 mg by mouth daily.   ranitidine 150 MG tablet Commonly known as:  ZANTAC Take 150 mg by mouth daily.   simvastatin 10 MG tablet Commonly known as:  ZOCOR Take 10 mg by mouth daily at 6 PM.   STOOL SOFTENER 100 MG capsule Generic drug:  docusate sodium Take 200-300 mg by mouth daily as needed for mild constipation.        Diagnostic Studies: Xr Foot Complete Right  Result Date: 08/07/2016 AP lateral right foot x-rays obtained and reviewed that shows good position alignment post-ORIF second metatarsal fracture Impression: Post ORIF second metatarsal in good position and alignment.  Xr Foot Complete Right  Result Date: 07/26/2016 Review x-rays right foot demonstrates second metatarsal midshaft fracture with some comminution shortening high riding angulation. Lisfranc joint appears reduced. Impression: Comminuted displaced angulated second metatarsal shaft fracture     Follow-up Information    [name], MD. Schedule an appointment as soon as possible for a visit today.   Specialty:  Orthopedic Surgery Why:  schedule return office visit for one week postopl Contact information: [place] [address] [phone]           Discharge Plan:  discharge to home  Disposition:     Signed: Benjiman Core  08/09/2016, 10:54 AM

## 2016-08-16 ENCOUNTER — Encounter (INDEPENDENT_AMBULATORY_CARE_PROVIDER_SITE_OTHER): Payer: Self-pay | Admitting: Orthopaedic Surgery

## 2016-08-16 ENCOUNTER — Ambulatory Visit (INDEPENDENT_AMBULATORY_CARE_PROVIDER_SITE_OTHER): Payer: Medicare Other | Admitting: Orthopaedic Surgery

## 2016-08-16 VITALS — BP 139/79 | HR 76 | Ht 61.0 in | Wt 217.0 lb

## 2016-08-16 DIAGNOSIS — S92321D Displaced fracture of second metatarsal bone, right foot, subsequent encounter for fracture with routine healing: Secondary | ICD-10-CM

## 2016-08-16 NOTE — Progress Notes (Signed)
Post-Op Visit Note   Patient: [name]           Date of Birth: 01-28-1945           MRN: [id number] Visit Date: 08/16/2016 PCP: Iona Beard, MD   Assessment & Plan: Post ORIF second metatarsal fracture.  Chief Complaint:  Chief Complaint  Patient presents with  . Right Foot - Routine Post Op   Visit Diagnoses:  1. Closed displaced fracture of second metatarsal bone of right foot with routine healing, subsequent encounter     Plan: Return in 4 weeks for right foot 3 view x-rays. Sutures removed Steri-Strips applied.  Follow-Up Instructions: Return in about 4 weeks (around 09/13/2016).   Orders:  No orders of the defined types were placed in this encounter.  No orders of the defined types were placed in this encounter.   Imaging: No results found.  PMFS History: Patient Active Problem List   Diagnosis Date Noted  . Closed fracture of shaft of metatarsal bone 07/30/2016  . Metatarsal fracture 07/30/2016  . Displaced fracture of second metatarsal bone, right foot, initial encounter for closed fracture 07/26/2016  . Arthritis of shoulder 04/10/2016  . High risk medication use 03/16/2016  . Primary osteoarthritis of both feet 03/16/2016  . Primary osteoarthritis of both knees 03/16/2016  . Spondylosis of lumbar region 03/16/2016  . OSA on CPAP 07/27/2015  . Lip numbness 03/19/2015  . TIA (transient ischemic attack) 03/19/2015  . 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 Post op infection 02/05/2015  . History of lumbar laminectomy for spinal cord decompression 01/17/2015  . Lumbar spinal stenosis 01/17/2015  . Primary osteoarthritis of left knee 03/25/2014  . Rheumatoid arthritis of multiple sites with negative rheumatoid factor ([place]) 03/25/2014  . History of total knee replacement, left 03/25/2014  . DERMATITIS 09/28/2008  . CONSTIPATION 05/25/2008  . DYSPNEA ON EXERTION 05/25/2008  . ANEMIA, NORMOCYTIC 04/13/2008  . ANKLE PAIN, BILATERAL 11/25/2007   . HLD (hyperlipidemia) 08/14/2006  . Morbid obesity ([place]) 12/13/2005  . CARPAL TUNNEL SYNDROME 12/13/2005  . PERIPHERAL NEUROPATHY 12/13/2005  . Essential hypertension 12/13/2005  . DYSRHYTHMIA, CARDIAC NOS 12/13/2005   Past Medical History:  Diagnosis Date  . Anemia   . Arthritis    Rheumatoid, followed by Dr. Estanislado Pandy, knees & ankles   . CHF (congestive heart failure) ([place])    pt not aware of this  . Constipation   . GERD (gastroesophageal reflux disease)   . H/O cardiovascular stress test 02/2014   seen by Dr. Einar Gip - told that she was cleared for surgery  . Heart murmur    states she's never had any problems  . Hypertension   . Leg cramps   . Shortness of breath dyspnea    with exertion  . Sleep apnea    CPAP in use- QO night, study done at [facility]- 2007   . Stroke [facility]) 03/2015   TIA    Family History  Problem Relation Age of Onset  . Seizures Mother   . Transient ischemic attack Mother   . Hypertension Mother   . Diabetes Mother   . Congestive Heart Failure Mother   . Arthritis/Rheumatoid Mother   . Parkinson's disease Mother   . Dementia Mother   . Heart disease Father        CHF  . Stroke Sister     Past Surgical History:  Procedure Laterality Date  . ABDOMINAL HYSTERECTOMY    . CARPAL TUNNEL RELEASE Right   .  COLONOSCOPY    . FOOT SURGERY Bilateral    bunionectomy, calluses , also has several pins & screws  . HAND SURGERY    . JOINT REPLACEMENT     planned for 03/25/2014  . KNEE SURGERY  1990's   . LUMBAR LAMINECTOMY/DECOMPRESSION MICRODISCECTOMY N/A 01/17/2015   Procedure: L2-3, L3-4 Decompression;  Surgeon: [name], MD;  Location: [place];  Service: Orthopedics;  Laterality: N/A;  . LUMBAR LAMINECTOMY/DECOMPRESSION MICRODISCECTOMY N/A 02/06/2015   Procedure: I AND D LUMBAR WITH WOUND VAC PLACEMENT;  Surgeon: [name], MD;  Location: [place];  Service: Orthopedics;  Laterality: N/A;  . ORIF TOE FRACTURE Right 07/30/2016   Procedure: OPEN REDUCTION  INTERNAL FIXATION (ORIF) RIGHT 2ND METATARSAL (TOE) FRACTURE;  Surgeon: [name], MD;  Location: [place];  Service: Orthopedics;  Laterality: Right;  . TOTAL KNEE ARTHROPLASTY Left 03/25/2014   Procedure: TOTAL KNEE ARTHROPLASTY;  Surgeon: Garald Balding, MD;  Location: [place];  Service: Orthopedics;  Laterality: Left;  . TUBAL LIGATION     Social History   Occupational History  . Not on file.   Social History Main Topics  . Smoking status: Former Smoker    Quit date: 03/16/1980  . Smokeless tobacco: Never Used  . Alcohol use No  . Drug use: No  . Sexual activity: Not on file

## 2016-08-20 ENCOUNTER — Telehealth (INDEPENDENT_AMBULATORY_CARE_PROVIDER_SITE_OTHER): Payer: Self-pay | Admitting: Orthopaedic Surgery

## 2016-08-20 NOTE — Telephone Encounter (Signed)
Patient is aware x-ray CD and report is ready for pick up

## 2016-08-20 NOTE — Telephone Encounter (Signed)
Patient called advised she need a copy of her X-ray report for 07/30/16. Patient advised she will pick. The number to contact patient is [phone]

## 2016-09-07 NOTE — Progress Notes (Signed)
Office Visit Note  Patient: [name]             Date of Birth: Jan 04, 1946           MRN: [id number]             PCP: Iona Beard, MD Referring: Iona Beard, MD Visit Date: 09/13/2016 Occupation: @GUAROCC @    Subjective:  Medication Management, hand stiffness   History of Present Illness: [name] is a 71 y.o. female with history of seronegative rheumatoid arthritis and osteoarthritis. She states she's been doing quite well on Humira. She has noticed some changes in her hands. But no joint swelling. She continues to have some discomfort in her lower back. She has some discomfort in her right shoulder and her right knee joint. Her left total knee replacement is doing well. Patient reports that she had a metatarsal fracture in her right foot and had a bone density recently by her PCP she does not know the results.  Activities of Daily Living:  Patient reports morning stiffness for 15-20  minutes.   Patient Reports nocturnal pain.  Legs Cramp / muscle cramping  Difficulty dressing/grooming: Reports Difficulty climbing stairs: Reports Difficulty getting out of chair: Reports Difficulty using hands for taps, buttons, cutlery, and/or writing: Reports   Review of Systems  Constitutional: Negative.  Negative for fatigue, night sweats, weight gain, weight loss and weakness.  HENT: Negative.  Negative for mouth sores, trouble swallowing, trouble swallowing, mouth dryness and nose dryness.   Eyes: Negative.  Negative for pain, redness, visual disturbance and dryness.  Respiratory: Positive for shortness of breath. Negative for cough and difficulty breathing.   Cardiovascular: Positive for swelling in legs/feet. Negative for chest pain, palpitations, hypertension and irregular heartbeat.  Gastrointestinal: Negative.  Negative for blood in stool, constipation and diarrhea.  Endocrine: Negative.  Negative for increased urination.  Genitourinary: Negative.  Negative for  vaginal dryness.  Musculoskeletal: Positive for gait problem, muscle weakness and morning stiffness. Negative for arthralgias, joint pain, joint swelling, myalgias, muscle tenderness and myalgias.       Legs cramping at night feels tightness of legs also   Skin: Negative for color change, rash, hair loss, skin tightness, ulcers and sensitivity to sunlight.       Bilateral forearms and neck   Allergic/Immunologic: Negative for susceptible to infections.  Neurological: Negative for dizziness, memory loss and night sweats.       Occasional dizziness  Hematological: Negative.  Negative for swollen glands.  Psychiatric/Behavioral: Negative.  Negative for depressed mood and sleep disturbance. The patient is not nervous/anxious.     PMFS History:  Patient Active Problem List   Diagnosis Date Noted  . Closed fracture of shaft of metatarsal bone 07/30/2016  . Metatarsal fracture 07/30/2016  . Displaced fracture of second metatarsal bone, right foot, initial encounter for closed fracture 07/26/2016  . Arthritis of shoulder 04/10/2016  . High risk medication use 03/16/2016  . Primary osteoarthritis of both feet 03/16/2016  . Primary osteoarthritis of both knees 03/16/2016  . DDD (degenerative disc disease), lumbar 03/16/2016  . OSA on CPAP 07/27/2015  . Lip numbness 03/19/2015  . TIA (transient ischemic attack) 03/19/2015  . 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 Post op infection 02/05/2015  . History of lumbar laminectomy for spinal cord decompression 01/17/2015  . Lumbar spinal stenosis 01/17/2015  . Primary osteoarthritis of left knee 03/25/2014  . Rheumatoid arthritis of multiple sites with negative rheumatoid factor ([place]) 03/25/2014  .  History of total knee replacement, left 03/25/2014  . DERMATITIS 09/28/2008  . CONSTIPATION 05/25/2008  . DYSPNEA ON EXERTION 05/25/2008  . ANEMIA, NORMOCYTIC 04/13/2008  . ANKLE PAIN, BILATERAL 11/25/2007  . HLD (hyperlipidemia) 08/14/2006  .  Morbid obesity ([place]) 12/13/2005  . CARPAL TUNNEL SYNDROME 12/13/2005  . PERIPHERAL NEUROPATHY 12/13/2005  . Essential hypertension 12/13/2005  . DYSRHYTHMIA, CARDIAC NOS 12/13/2005    Past Medical History:  Diagnosis Date  . Anemia   . Arthritis    Rheumatoid, followed by Dr. Estanislado Pandy, knees & ankles   . CHF (congestive heart failure) ([place])    pt not aware of this  . Constipation   . GERD (gastroesophageal reflux disease)   . H/O cardiovascular stress test 02/2014   seen by Dr. Einar Gip - told that she was cleared for surgery  . Heart murmur    states she's never had any problems  . Hypertension   . Leg cramps   . Shortness of breath dyspnea    with exertion  . Sleep apnea    CPAP in use- QO night, study done at [facility]- 2007   . Stroke [facility]) 03/2015   TIA    Family History  Problem Relation Age of Onset  . Seizures Mother   . Transient ischemic attack Mother   . Hypertension Mother   . Diabetes Mother   . Congestive Heart Failure Mother   . Arthritis/Rheumatoid Mother   . Parkinson's disease Mother   . Dementia Mother   . Heart disease Father        CHF  . Stroke Sister    Past Surgical History:  Procedure Laterality Date  . ABDOMINAL HYSTERECTOMY    . CARPAL TUNNEL RELEASE Right   . COLONOSCOPY    . FOOT SURGERY Bilateral    bunionectomy, calluses , also has several pins & screws  . HAND SURGERY    . JOINT REPLACEMENT     planned for 03/25/2014  . KNEE SURGERY  1990's   . LUMBAR LAMINECTOMY/DECOMPRESSION MICRODISCECTOMY N/A 01/17/2015   Procedure: L2-3, L3-4 Decompression;  Surgeon: [name], MD;  Location: [place];  Service: Orthopedics;  Laterality: N/A;  . LUMBAR LAMINECTOMY/DECOMPRESSION MICRODISCECTOMY N/A 02/06/2015   Procedure: I AND D LUMBAR WITH WOUND VAC PLACEMENT;  Surgeon: [name], MD;  Location: [place];  Service: Orthopedics;  Laterality: N/A;  . ORIF TOE FRACTURE Right 07/30/2016   Procedure: OPEN REDUCTION INTERNAL FIXATION (ORIF) RIGHT 2ND  METATARSAL (TOE) FRACTURE;  Surgeon: [name], MD;  Location: [place];  Service: Orthopedics;  Laterality: Right;  . TOTAL KNEE ARTHROPLASTY Left 03/25/2014   Procedure: TOTAL KNEE ARTHROPLASTY;  Surgeon: Garald Balding, MD;  Location: [place];  Service: Orthopedics;  Laterality: Left;  . TUBAL LIGATION     Social History   Social History Narrative   Lives with husband.  2 Sons.  Using walker.    Caffeine  Less then 1 cups daily.   Retired.       Objective: Vital Signs: BP 130/78   Pulse 84   Resp 16   Ht 5\' 1"  (1.549 m)   Wt 226 lb (102.5 kg)   BMI 42.70 kg/m    Physical Exam  Constitutional: She is oriented to person, place, and time. She appears well-developed and well-nourished.  HENT:  Head: Normocephalic and atraumatic.  Eyes: Conjunctivae and EOM are normal.  Neck: Normal range of motion.  Cardiovascular: Normal rate, regular rhythm, normal heart sounds and intact distal  pulses.   Pulmonary/Chest: Effort normal and breath sounds normal.  Abdominal: Soft. Bowel sounds are normal.  Lymphadenopathy:    She has no cervical adenopathy.  Neurological: She is alert and oriented to person, place, and time.  Skin: Skin is warm and dry. Capillary refill takes less than 2 seconds.  Psychiatric: She has a normal mood and affect. Her behavior is normal.  Nursing note and vitals reviewed.    Musculoskeletal Exam: C-spine and thoracic spine good range of motion. She has limited range of motion of her lumbar spine with the discomfort. Shoulder joint abduction was limited to 100 bilaterally with discomfort. Elbow joints are good range of motion. She has some synovial thickening over MCPs and wrist joints but no synovitis was noted. DIP PIP thickening was noted due to osteoarthritis. Hip joints were good range of motion. She has left total knee replacement which appears to be doing well the right knee joint has discomfort range of motion without any warmth swelling or effusion. She has  some pitting edema on her bilateral lower extremities.  CDAI Exam: CDAI Homunculus Exam:   Joint Counts:  CDAI Tender Joint count: 0 CDAI Swollen Joint count: 0  Global Assessments:  Patient Global Assessment: 3 Provider Global Assessment: 3  CDAI Calculated Score: 6    Investigation: Findings:  01/2016 negative TB skin test   CBC Latest Ref Rng & Units 07/30/2016 05/11/2016 02/21/2016  WBC 4.0 - 10.5 K/uL 7.8 6.8 6.5  Hemoglobin 12.0 - 15.0 g/dL 11.3(L) 11.7 11.1(L)  Hematocrit 36.0 - 46.0 % 35.6(L) 36.3 35.1  Platelets 150 - 400 K/uL 264 272 266   CMP Latest Ref Rng & Units 07/30/2016 05/11/2016 02/21/2016  Glucose 65 - 99 mg/dL 81 77 88  BUN 6 - 20 mg/dL 22(H) 26(H) 21  Creatinine 0.44 - 1.00 mg/dL 1.20(H) 1.11(H) 1.18(H)  Sodium 135 - 145 mmol/L 141 141 141  Potassium 3.5 - 5.1 mmol/L 4.2 4.2 4.3  Chloride 101 - 111 mmol/L 106 105 106  CO2 22 - 32 mmol/L 24 28 25   Calcium 8.9 - 10.3 mg/dL 9.0 9.1 8.9  Total Protein 6.5 - 8.1 g/dL 7.4 7.3 7.0  Total Bilirubin 0.3 - 1.2 mg/dL 0.6 0.3 0.4  Alkaline Phos 38 - 126 U/L 115 107 107  AST 15 - 41 U/L 18 17 15   ALT 14 - 54 U/L 12(L) 10 10    Imaging: No results found.  Speciality Comments: No specialty comments available.    Procedures:  No procedures performed Allergies: No known allergies   Assessment / Plan:     Visit Diagnosis: Rheumatoid arthritis with negative rheumatoid factor: Patient does not have any active synovitis she seems to be doing quite well on Humira. She continues to have some arthralgias which probably secondary to underlying osteoarthritis.  High risk medication use - Humira 40 mg subcutaneous every other week - Plan: CBC with Differential/Platelet, COMPLETE METABOLIC PANEL WITH GFR, today and then every 3 months. Her creatinine has been elevated. We will check TB gold with next labs. 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  History of hypertension: Her blood pressure is mildly elevated.  History of hyperlipidemia  History of sleep apnea  Orders: Orders Placed This Encounter  Procedures  . Quantiferon tb gold assay (blood)   No orders of the defined types were placed in this encounter.    Follow-Up Instructions: Return in about 5 months (around 02/13/2017) for Rheumatoid arthritis, Osteoarthritis.   Bo Merino, MD  Note - This record has been created using Editor, commissioning.  Chart creation errors have been sought, but may not always  have been located. Such creation errors do not reflect on  the standard of medical care.

## 2016-09-10 ENCOUNTER — Other Ambulatory Visit: Payer: Self-pay | Admitting: Rheumatology

## 2016-09-11 NOTE — Telephone Encounter (Signed)
Last Visit: 04/12/16 Next Visit: 09/13/16 Labs: 07/30/16 Hgb 11.3 Previously 11.7 Creat 1.20 Previously 1.11 GFR 52 Previously 58  TB Gold: 07/01/15 Neg  Okay to refill Humira?

## 2016-09-11 NOTE — Telephone Encounter (Signed)
ok 

## 2016-09-13 ENCOUNTER — Ambulatory Visit (INDEPENDENT_AMBULATORY_CARE_PROVIDER_SITE_OTHER): Payer: Medicare Other

## 2016-09-13 ENCOUNTER — Ambulatory Visit (INDEPENDENT_AMBULATORY_CARE_PROVIDER_SITE_OTHER): Payer: Medicare Other | Admitting: Rheumatology

## 2016-09-13 ENCOUNTER — Encounter: Payer: Self-pay | Admitting: Rheumatology

## 2016-09-13 ENCOUNTER — Encounter (INDEPENDENT_AMBULATORY_CARE_PROVIDER_SITE_OTHER): Payer: Self-pay | Admitting: Orthopaedic Surgery

## 2016-09-13 ENCOUNTER — Ambulatory Visit (INDEPENDENT_AMBULATORY_CARE_PROVIDER_SITE_OTHER): Payer: Medicare Other | Admitting: Orthopaedic Surgery

## 2016-09-13 VITALS — BP 130/78 | HR 84 | Resp 16 | Ht 61.0 in | Wt 226.0 lb

## 2016-09-13 VITALS — BP 124/72 | HR 77

## 2016-09-13 DIAGNOSIS — M0609 Rheumatoid arthritis without rheumatoid factor, multiple sites: Secondary | ICD-10-CM | POA: Diagnosis not present

## 2016-09-13 DIAGNOSIS — Z96652 Presence of left artificial knee joint: Secondary | ICD-10-CM

## 2016-09-13 DIAGNOSIS — Z8669 Personal history of other diseases of the nervous system and sense organs: Secondary | ICD-10-CM

## 2016-09-13 DIAGNOSIS — Z8673 Personal history of transient ischemic attack (TIA), and cerebral infarction without residual deficits: Secondary | ICD-10-CM | POA: Diagnosis not present

## 2016-09-13 DIAGNOSIS — M1711 Unilateral primary osteoarthritis, right knee: Secondary | ICD-10-CM

## 2016-09-13 DIAGNOSIS — M5136 Other intervertebral disc degeneration, lumbar region: Secondary | ICD-10-CM

## 2016-09-13 DIAGNOSIS — M19019 Primary osteoarthritis, unspecified shoulder: Secondary | ICD-10-CM

## 2016-09-13 DIAGNOSIS — Z8679 Personal history of other diseases of the circulatory system: Secondary | ICD-10-CM

## 2016-09-13 DIAGNOSIS — M19072 Primary osteoarthritis, left ankle and foot: Secondary | ICD-10-CM

## 2016-09-13 DIAGNOSIS — S92321D Displaced fracture of second metatarsal bone, right foot, subsequent encounter for fracture with routine healing: Secondary | ICD-10-CM

## 2016-09-13 DIAGNOSIS — M19071 Primary osteoarthritis, right ankle and foot: Secondary | ICD-10-CM | POA: Diagnosis not present

## 2016-09-13 DIAGNOSIS — Z79899 Other long term (current) drug therapy: Secondary | ICD-10-CM | POA: Diagnosis not present

## 2016-09-13 DIAGNOSIS — S92321A Displaced fracture of second metatarsal bone, right foot, initial encounter for closed fracture: Secondary | ICD-10-CM

## 2016-09-13 DIAGNOSIS — Z8639 Personal history of other endocrine, nutritional and metabolic disease: Secondary | ICD-10-CM

## 2016-09-13 NOTE — Patient Instructions (Addendum)
Standing Labs We placed an order today for your standing lab work.    Please come back and get your standing labs in December with TB gold and every 3 months  We have open lab Monday through Friday from 8:30-11:30 AM and 1:30-4 PM at the office of Dr. Bo Merino.   The office is located at [address], [place], [place], [place] [address] No appointment is necessary.   Labs are drawn by [facility].  You may receive a bill from [facility] for your lab work. If you have any questions regarding directions or hours of operation,  please call [phone].

## 2016-09-13 NOTE — Progress Notes (Signed)
Post-Op Visit Note   Patient: [name]           Date of Birth: 1945/03/18           MRN: [id number] Visit Date: 09/13/2016 PCP: Iona Beard, MD   Assessment & Plan: Follow-up the second metatarsal shaft displaced fracture ORIF. X-rays demonstrate satisfactory healing she can slowly wean herself back into regular shoe. She'll get back to the [employer] of water aerobics and then gradually add a little bit of exercise by and then elliptical and progress. She's to work out regularly several years ago. She was to return in a few months we can discuss her right total knee arthroplasty plans.  Chief Complaint:  Chief Complaint  Patient presents with  . Right Foot - Routine Post Op   Visit Diagnoses:  1. Closed displaced fracture of second metatarsal bone of right foot with routine healing, subsequent encounter   2. Displaced fracture of second metatarsal bone, right foot, initial encounter for closed fracture     Plan: She'll gradually work on no weight loss, get back to the gym start with pool exercises and progress to low impact exercises such as the bike and elliptical machine she still has problems with her knee and will return in 4 months to the discussed her plans for total knee arthroplasty on the right. Gradually wean her way into a regular shoe and continue to work on diet.  Follow-Up Instructions: Return in about 4 months (around 01/13/2017).   Orders:  Orders Placed This Encounter  Procedures  . XR Foot Complete Right   No orders of the defined types were placed in this encounter.   Imaging: No results found.  PMFS History: Patient Active Problem List   Diagnosis Date Noted  . Closed fracture of shaft of metatarsal bone 07/30/2016  . Metatarsal fracture 07/30/2016  . Displaced fracture of second metatarsal bone, right foot, initial encounter for closed fracture 07/26/2016  . Arthritis of shoulder 04/10/2016  . High risk medication use 03/16/2016  . Primary  osteoarthritis of both feet 03/16/2016  . Primary osteoarthritis of both knees 03/16/2016  . DDD (degenerative disc disease), lumbar 03/16/2016  . OSA on CPAP 07/27/2015  . Lip numbness 03/19/2015  . TIA (transient ischemic attack) 03/19/2015  . 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 Post op infection 02/05/2015  . History of lumbar laminectomy for spinal cord decompression 01/17/2015  . Lumbar spinal stenosis 01/17/2015  . Primary osteoarthritis of left knee 03/25/2014  . Rheumatoid arthritis of multiple sites with negative rheumatoid factor ([place]) 03/25/2014  . History of total knee replacement, left 03/25/2014  . DERMATITIS 09/28/2008  . CONSTIPATION 05/25/2008  . DYSPNEA ON EXERTION 05/25/2008  . ANEMIA, NORMOCYTIC 04/13/2008  . ANKLE PAIN, BILATERAL 11/25/2007  . HLD (hyperlipidemia) 08/14/2006  . Morbid obesity ([place]) 12/13/2005  . CARPAL TUNNEL SYNDROME 12/13/2005  . PERIPHERAL NEUROPATHY 12/13/2005  . Essential hypertension 12/13/2005  . DYSRHYTHMIA, CARDIAC NOS 12/13/2005   Past Medical History:  Diagnosis Date  . Anemia   . Arthritis    Rheumatoid, followed by Dr. Estanislado Pandy, knees & ankles   . CHF (congestive heart failure) ([place])    pt not aware of this  . Constipation   . GERD (gastroesophageal reflux disease)   . H/O cardiovascular stress test 02/2014   seen by Dr. Einar Gip - told that she was cleared for surgery  . Heart murmur    states she's never had any problems  . Hypertension   .  Leg cramps   . Shortness of breath dyspnea    with exertion  . Sleep apnea    CPAP in use- QO night, study done at [facility]- 2007   . Stroke [facility]) 03/2015   TIA    Family History  Problem Relation Age of Onset  . Seizures Mother   . Transient ischemic attack Mother   . Hypertension Mother   . Diabetes Mother   . Congestive Heart Failure Mother   . Arthritis/Rheumatoid Mother   . Parkinson's disease Mother   . Dementia Mother   . Heart disease Father        CHF  .  Stroke Sister     Past Surgical History:  Procedure Laterality Date  . ABDOMINAL HYSTERECTOMY    . CARPAL TUNNEL RELEASE Right   . COLONOSCOPY    . FOOT SURGERY Bilateral    bunionectomy, calluses , also has several pins & screws  . HAND SURGERY    . JOINT REPLACEMENT     planned for 03/25/2014  . KNEE SURGERY  1990's   . LUMBAR LAMINECTOMY/DECOMPRESSION MICRODISCECTOMY N/A 01/17/2015   Procedure: L2-3, L3-4 Decompression;  Surgeon: [name], MD;  Location: [place];  Service: Orthopedics;  Laterality: N/A;  . LUMBAR LAMINECTOMY/DECOMPRESSION MICRODISCECTOMY N/A 02/06/2015   Procedure: I AND D LUMBAR WITH WOUND VAC PLACEMENT;  Surgeon: [name], MD;  Location: [place];  Service: Orthopedics;  Laterality: N/A;  . ORIF TOE FRACTURE Right 07/30/2016   Procedure: OPEN REDUCTION INTERNAL FIXATION (ORIF) RIGHT 2ND METATARSAL (TOE) FRACTURE;  Surgeon: [name], MD;  Location: [place];  Service: Orthopedics;  Laterality: Right;  . TOTAL KNEE ARTHROPLASTY Left 03/25/2014   Procedure: TOTAL KNEE ARTHROPLASTY;  Surgeon: Garald Balding, MD;  Location: [place];  Service: Orthopedics;  Laterality: Left;  . TUBAL LIGATION     Social History   Occupational History  . Not on file.   Social History Main Topics  . Smoking status: Former Smoker    Quit date: 03/16/1980  . Smokeless tobacco: Never Used  . Alcohol use No  . Drug use: No  . Sexual activity: Not on file

## 2016-09-14 LAB — COMPLETE METABOLIC PANEL WITH GFR
AG Ratio: 1.2 (calc) (ref 1.0–2.5)
ALT: 8 U/L (ref 6–29)
AST: 15 U/L (ref 10–35)
Albumin: 4 g/dL (ref 3.6–5.1)
Alkaline phosphatase (APISO): 110 U/L (ref 33–130)
BUN/Creatinine Ratio: 19 (calc) (ref 6–22)
BUN: 25 mg/dL (ref 7–25)
CO2: 26 mmol/L (ref 20–32)
Calcium: 9.1 mg/dL (ref 8.6–10.4)
Chloride: 106 mmol/L (ref 98–110)
Creat: 1.29 mg/dL — ABNORMAL HIGH (ref 0.60–0.93)
GFR, Est African American: 49 mL/min/{1.73_m2} — ABNORMAL LOW (ref >=60)
GFR, Est Non African American: 42 mL/min/{1.73_m2} — ABNORMAL LOW (ref >=60)
Globulin: 3.3 g/dL (calc) (ref 1.9–3.7)
Glucose, Bld: 100 mg/dL — ABNORMAL HIGH (ref 65–99)
Potassium: 3.9 mmol/L (ref 3.5–5.3)
Sodium: 142 mmol/L (ref 135–146)
Total Bilirubin: 0.3 mg/dL (ref 0.2–1.2)
Total Protein: 7.3 g/dL (ref 6.1–8.1)

## 2016-09-14 LAB — CBC WITH DIFFERENTIAL/PLATELET
Basophils Absolute: 68 cells/uL (ref 0–200)
Basophils Relative: 0.9 %
Eosinophils Absolute: 180 cells/uL (ref 15–500)
Eosinophils Relative: 2.4 %
HCT: 35.7 % (ref 35.0–45.0)
Hemoglobin: 11.5 g/dL — ABNORMAL LOW (ref 11.7–15.5)
Lymphs Abs: 2153 cells/uL (ref 850–3900)
MCH: 28.3 pg (ref 27.0–33.0)
MCHC: 32.2 g/dL (ref 32.0–36.0)
MCV: 87.9 fL (ref 80.0–100.0)
MPV: 11.5 fL (ref 7.5–12.5)
Monocytes Relative: 5.5 %
Neutro Abs: 4688 cells/uL (ref 1500–7800)
Neutrophils Relative %: 62.5 %
Platelets: 271 10*3/uL (ref 140–400)
RBC: 4.06 10*6/uL (ref 3.80–5.10)
RDW: 11.8 % (ref 11.0–15.0)
Total Lymphocyte: 28.7 %
WBC mixed population: 413 cells/uL (ref 200–950)
WBC: 7.5 10*3/uL (ref 3.8–10.8)

## 2016-09-14 NOTE — Progress Notes (Signed)
Labs are stable. Increase in Cr. Please fax labs to her PCP.

## 2016-10-10 ENCOUNTER — Telehealth: Payer: Self-pay | Admitting: Rheumatology

## 2016-10-10 NOTE — Telephone Encounter (Signed)
Patient requesting last lab report sent to GP Dr. Iona Beard Fax#[phone]

## 2016-10-10 NOTE — Telephone Encounter (Signed)
Labs faxed to PCP

## 2016-10-16 ENCOUNTER — Other Ambulatory Visit: Payer: Self-pay | Admitting: Rheumatology

## 2016-10-17 NOTE — Telephone Encounter (Signed)
Last Visit: 09/13/16 Next Visit: 02/21/17 Labs: 09/13/16 stable. Increase in Cr. 01/2016 negative TB skin test   Okay to refill per Dr. Estanislado Pandy

## 2016-10-29 ENCOUNTER — Other Ambulatory Visit: Payer: Self-pay | Admitting: Rheumatology

## 2016-10-29 DIAGNOSIS — Z5181 Encounter for therapeutic drug level monitoring: Secondary | ICD-10-CM

## 2016-10-29 NOTE — Telephone Encounter (Signed)
Last Visit: 09/13/16 Next Visit: 02/21/17 UDS: 07/01/15 Narc agreement: 07/08/15  Okay to refill 30 day supply Tramadol?

## 2016-10-30 ENCOUNTER — Other Ambulatory Visit: Payer: Self-pay

## 2016-10-30 DIAGNOSIS — Z5181 Encounter for therapeutic drug level monitoring: Secondary | ICD-10-CM

## 2016-10-30 NOTE — Telephone Encounter (Signed)
ok 

## 2016-10-30 NOTE — Telephone Encounter (Signed)
Patient advised she needs to update Narcotic agreement and UDS. Patient states she will try to come today.

## 2016-10-31 NOTE — Progress Notes (Signed)
C/w

## 2016-11-02 LAB — PAIN MGMT, PROFILE 5 W/CONF, U
Amphetamines: NEGATIVE ng/mL (ref <500)
Barbiturates: NEGATIVE ng/mL (ref <300)
Benzodiazepines: NEGATIVE ng/mL (ref <100)
Cocaine Metabolite: NEGATIVE ng/mL (ref <150)
Creatinine: 154.5 mg/dL
Marijuana Metabolite: NEGATIVE ng/mL (ref <20)
Methadone Metabolite: NEGATIVE ng/mL (ref <100)
Opiates: NEGATIVE ng/mL (ref <100)
Oxidant: NEGATIVE ug/mL (ref <200)
Oxycodone: NEGATIVE ng/mL (ref <100)
pH: 5.49 (ref 4.5–9.0)

## 2016-11-02 LAB — PAIN MGMT, TRAMADOL W/MEDMATCH, U
Desmethyltramadol: 3499 ng/mL — ABNORMAL HIGH (ref <100)
Tramadol: 16632 ng/mL — ABNORMAL HIGH (ref <100)

## 2016-11-26 ENCOUNTER — Other Ambulatory Visit (HOSPITAL_COMMUNITY): Payer: Self-pay | Admitting: Family Medicine

## 2016-11-26 DIAGNOSIS — Z1231 Encounter for screening mammogram for malignant neoplasm of breast: Secondary | ICD-10-CM

## 2016-12-06 ENCOUNTER — Ambulatory Visit (HOSPITAL_COMMUNITY)
Admission: RE | Admit: 2016-12-06 | Discharge: 2016-12-06 | Disposition: A | Discharge status: Home / Self Care | Payer: Medicare Other | Source: Ambulatory Visit | Attending: Family Medicine | Admitting: Family Medicine

## 2016-12-06 DIAGNOSIS — Z1231 Encounter for screening mammogram for malignant neoplasm of breast: Secondary | ICD-10-CM | POA: Diagnosis present

## 2016-12-17 ENCOUNTER — Other Ambulatory Visit: Payer: Self-pay | Admitting: Rheumatology

## 2016-12-18 ENCOUNTER — Other Ambulatory Visit: Payer: Self-pay

## 2016-12-18 NOTE — Telephone Encounter (Signed)
Patient would like a Rx refill for Humira Pen sent to [facility] in [place].  Patient stated that her previous pharmacy is not able to fill the Rx for Humira.  CB# is [phone].  Please advise.  Thank You.

## 2016-12-19 NOTE — Telephone Encounter (Signed)
Last Visit: 09/13/16 Next Visit: 02/21/17 UDS: 10/30/16 Narc Agreement: 10/30/16  Okay to refill Tramadol?

## 2016-12-19 NOTE — Telephone Encounter (Signed)
ok 

## 2016-12-20 MED ORDER — ADALIMUMAB 40 MG/0.4ML ~~LOC~~ AJKT: 40.0000 mg | AUTO-INJECTOR | SUBCUTANEOUS | 0 refills | Status: DC

## 2016-12-20 NOTE — Addendum Note (Signed)
Addended by: Carole Binning on: 12/20/2016 01:48 PM   Modules accepted: Orders

## 2016-12-20 NOTE — Addendum Note (Signed)
Addended by: Carole Binning on: 12/20/2016 10:54 AM   Modules accepted: Orders

## 2016-12-20 NOTE — Telephone Encounter (Signed)
Last Visit: 09/13/16 Next Visit: 02/21/17 Labs: 09/13/16 stable . Increase in Creat. TB skin test negative 02/10/16  Okay to refill per Dr. Estanislado Pandy

## 2016-12-21 ENCOUNTER — Other Ambulatory Visit: Payer: Self-pay | Admitting: *Deleted

## 2016-12-21 DIAGNOSIS — Z79899 Other long term (current) drug therapy: Secondary | ICD-10-CM

## 2016-12-21 MED FILL — HUMIRA PEN 40 MG/0.4ML PNKT: 40 | 84 days supply | Qty: 6 | Fill #0

## 2016-12-21 NOTE — Progress Notes (Signed)
Labs are stable.

## 2016-12-23 LAB — COMPLETE METABOLIC PANEL WITH GFR
AG Ratio: 1.1 (calc) (ref 1.0–2.5)
ALT: 9 U/L (ref 6–29)
AST: 16 U/L (ref 10–35)
Albumin: 3.7 g/dL (ref 3.6–5.1)
Alkaline phosphatase (APISO): 110 U/L (ref 33–130)
BUN/Creatinine Ratio: 21 (calc) (ref 6–22)
BUN: 26 mg/dL — ABNORMAL HIGH (ref 7–25)
CO2: 29 mmol/L (ref 20–32)
Calcium: 8.8 mg/dL (ref 8.6–10.4)
Chloride: 105 mmol/L (ref 98–110)
Creat: 1.22 mg/dL — ABNORMAL HIGH (ref 0.60–0.93)
GFR, Est African American: 52 mL/min/{1.73_m2} — ABNORMAL LOW (ref >=60)
GFR, Est Non African American: 45 mL/min/{1.73_m2} — ABNORMAL LOW (ref >=60)
Globulin: 3.4 g/dL (calc) (ref 1.9–3.7)
Glucose, Bld: 83 mg/dL (ref 65–139)
Potassium: 4.4 mmol/L (ref 3.5–5.3)
Sodium: 141 mmol/L (ref 135–146)
Total Bilirubin: 0.4 mg/dL (ref 0.2–1.2)
Total Protein: 7.1 g/dL (ref 6.1–8.1)

## 2016-12-23 LAB — CBC WITH DIFFERENTIAL/PLATELET
Basophils Absolute: 56 cells/uL (ref 0–200)
Basophils Relative: 0.7 %
Eosinophils Absolute: 232 cells/uL (ref 15–500)
Eosinophils Relative: 2.9 %
HCT: 33.4 % — ABNORMAL LOW (ref 35.0–45.0)
Hemoglobin: 11 g/dL — ABNORMAL LOW (ref 11.7–15.5)
Lymphs Abs: 1784 cells/uL (ref 850–3900)
MCH: 28.7 pg (ref 27.0–33.0)
MCHC: 32.9 g/dL (ref 32.0–36.0)
MCV: 87.2 fL (ref 80.0–100.0)
MPV: 11.9 fL (ref 7.5–12.5)
Monocytes Relative: 5.5 %
Neutro Abs: 5488 cells/uL (ref 1500–7800)
Neutrophils Relative %: 68.6 %
Platelets: 251 10*3/uL (ref 140–400)
RBC: 3.83 10*6/uL (ref 3.80–5.10)
RDW: 12.7 % (ref 11.0–15.0)
Total Lymphocyte: 22.3 %
WBC mixed population: 440 cells/uL (ref 200–950)
WBC: 8 10*3/uL (ref 3.8–10.8)

## 2016-12-23 LAB — QUANTIFERON-TB GOLD PLUS
Mitogen-NIL: 8.62 IU/mL
NIL: 0.04 IU/mL
QuantiFERON-TB Gold Plus: NEGATIVE
TB1-NIL: 0.01 IU/mL
TB2-NIL: 0.02 IU/mL

## 2017-01-30 ENCOUNTER — Telehealth: Payer: Self-pay | Admitting: Rheumatology

## 2017-01-30 NOTE — Telephone Encounter (Signed)
Patient left a voicemail returning our call regarding her lab results.  CB# [phone]

## 2017-01-30 NOTE — Telephone Encounter (Signed)
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

## 2017-02-15 ENCOUNTER — Other Ambulatory Visit: Payer: Self-pay | Admitting: Rheumatology

## 2017-02-15 ENCOUNTER — Ambulatory Visit: Payer: Medicare Other | Admitting: Rheumatology

## 2017-02-15 NOTE — Telephone Encounter (Signed)
Last Visit: 09/13/16 Next Visit: 02/21/17 UDS: 10/30/16 Narc Agreement: 10/30/16  Okay to refill Tramadol?

## 2017-02-15 NOTE — Telephone Encounter (Signed)
Ok to refill 

## 2017-02-21 ENCOUNTER — Ambulatory Visit: Payer: Medicare Other | Admitting: Rheumatology

## 2017-03-11 ENCOUNTER — Other Ambulatory Visit: Payer: Self-pay | Admitting: Rheumatology

## 2017-03-11 NOTE — Telephone Encounter (Signed)
Last Visit: 09/13/16 Next Visit: 03/29/17 Labs: 12/21/16 Stable TB Gold: 12/21/16 Neg   Okay to refill per Dr. Estanislado Pandy

## 2017-03-18 NOTE — Progress Notes (Signed)
Office Visit Note  Patient: [name]             Date of Birth: 22-Nov-1945           MRN: [id number]             PCP: Ann Held, MD Referring: Iona Beard, MD Visit Date: 03/29/2017 Occupation: @GUAROCC @    Subjective:  Right hand pain    History of Present Illness: [name] is a 72 y.o. female with history of seronegative rheumatoid arthritis and osteoarthritis.  Patient states that she has been taking Humira every 2 weeks.  She denies missing any doses recently.  She states that she has been having increased pain in her right hand and right wrist.  She states she has noticed some swelling in her right thumb.  She denies any pain or swelling in her feet.  She states her left knee replacement is doing great.  She states she needs to get her right knee replaced but she is not ready at this time.  She states that her lower back pain has improved since going to a chiropractor on a regular basis.  She states her range of motion has improved.  She states the chiropractor has been working on her balance.  She states that she continues to walk with a cane.  Patient states that she takes 1-2 tablets of tramadol as needed for pain relief.  She needs a refill today.   Activities of Daily Living:  Patient reports morning stiffness for 15 minutes.   Patient Denies nocturnal pain.  Difficulty dressing/grooming: Denies Difficulty climbing stairs: Reports Difficulty getting out of chair: Reports Difficulty using hands for taps, buttons, cutlery, and/or writing: Denies   Review of Systems  Constitutional: Positive for fatigue.  HENT: Negative for mouth sores, trouble swallowing, trouble swallowing, mouth dryness and nose dryness.   Eyes: Negative for pain, redness, visual disturbance and dryness.  Respiratory: Negative for cough, hemoptysis, shortness of breath and difficulty breathing.   Cardiovascular: Negative for chest pain, palpitations, hypertension and swelling in  legs/feet.  Gastrointestinal: Negative for blood in stool, constipation and diarrhea.  Endocrine: Negative for increased urination.  Genitourinary: Negative for painful urination.  Musculoskeletal: Positive for arthralgias, joint pain, joint swelling and morning stiffness. Negative for myalgias, muscle weakness, muscle tenderness and myalgias.  Skin: Positive for rash. Negative for color change, hair loss, nodules/bumps, redness, skin tightness, ulcers and sensitivity to sunlight.  Allergic/Immunologic: Negative for susceptible to infections.  Neurological: Negative for dizziness, numbness, headaches and weakness.  Hematological: Negative for swollen glands.  Psychiatric/Behavioral: Negative for depressed mood and sleep disturbance. The patient is not nervous/anxious.     PMFS History:  Patient Active Problem List   Diagnosis Date Noted  . Closed fracture of shaft of metatarsal bone 07/30/2016  . Metatarsal fracture 07/30/2016  . Displaced fracture of second metatarsal bone, right foot, initial encounter for closed fracture 07/26/2016  . Arthritis of shoulder 04/10/2016  . High risk medication use 03/16/2016  . Primary osteoarthritis of both feet 03/16/2016  . Primary osteoarthritis of both knees 03/16/2016  . DDD (degenerative disc disease), lumbar 03/16/2016  . OSA on CPAP 07/27/2015  . Lip numbness 03/19/2015  . TIA (transient ischemic attack) 03/19/2015  . 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[place]) 02/06/2015  . Post op infection 02/05/2015  . History of lumbar laminectomy for spinal cord decompression 01/17/2015  . Lumbar spinal stenosis 01/17/2015  . Primary osteoarthritis of left knee 03/25/2014  .  Rheumatoid arthritis of multiple sites with negative rheumatoid factor ([place]) 03/25/2014  . History of total knee replacement, left 03/25/2014  . DERMATITIS 09/28/2008  . CONSTIPATION 05/25/2008  . DYSPNEA ON EXERTION 05/25/2008  . ANEMIA, NORMOCYTIC 04/13/2008  . ANKLE PAIN, BILATERAL  11/25/2007  . HLD (hyperlipidemia) 08/14/2006  . Morbid obesity ([place]) 12/13/2005  . CARPAL TUNNEL SYNDROME 12/13/2005  . PERIPHERAL NEUROPATHY 12/13/2005  . Essential hypertension 12/13/2005  . DYSRHYTHMIA, CARDIAC NOS 12/13/2005    Past Medical History:  Diagnosis Date  . Anemia   . Arthritis    Rheumatoid, followed by Dr. Estanislado Pandy, knees & ankles   . CHF (congestive heart failure) ([place])    pt not aware of this  . Constipation   . GERD (gastroesophageal reflux disease)   . H/O cardiovascular stress test 02/2014   seen by Dr. Einar Gip - told that she was cleared for surgery  . Heart murmur    states she's never had any problems  . Hypertension   . Leg cramps   . Shortness of breath dyspnea    with exertion  . Sleep apnea    CPAP in use- QO night, study done at [facility]- 2007   . Stroke [facility]) 03/2015   TIA    Family History  Problem Relation Age of Onset  . Seizures Mother   . Transient ischemic attack Mother   . Hypertension Mother   . Diabetes Mother   . Congestive Heart Failure Mother   . Arthritis/Rheumatoid Mother   . Parkinson's disease Mother   . Dementia Mother   . Heart disease Father        CHF  . Stroke Sister   . Depression Sister   . Arthritis Brother   . Arthritis Brother   . Arthritis Son   . Hypertension Son    Past Surgical History:  Procedure Laterality Date  . ABDOMINAL HYSTERECTOMY    . CARPAL TUNNEL RELEASE Right   . COLONOSCOPY    . FOOT SURGERY Bilateral    bunionectomy, calluses , also has several pins & screws  . HAND SURGERY    . JOINT REPLACEMENT     planned for 03/25/2014  . KNEE SURGERY  1990's   . LUMBAR LAMINECTOMY/DECOMPRESSION MICRODISCECTOMY N/A 01/17/2015   Procedure: L2-3, L3-4 Decompression;  Surgeon: [name], MD;  Location: [place];  Service: Orthopedics;  Laterality: N/A;  . LUMBAR LAMINECTOMY/DECOMPRESSION MICRODISCECTOMY N/A 02/06/2015   Procedure: I AND D LUMBAR WITH WOUND VAC PLACEMENT;  Surgeon: [name], MD;   Location: [place];  Service: Orthopedics;  Laterality: N/A;  . ORIF TOE FRACTURE Right 07/30/2016   Procedure: OPEN REDUCTION INTERNAL FIXATION (ORIF) RIGHT 2ND METATARSAL (TOE) FRACTURE;  Surgeon: [name], MD;  Location: [place];  Service: Orthopedics;  Laterality: Right;  . TOTAL KNEE ARTHROPLASTY Left 03/25/2014   Procedure: TOTAL KNEE ARTHROPLASTY;  Surgeon: Garald Balding, MD;  Location: [place];  Service: Orthopedics;  Laterality: Left;  . TUBAL LIGATION     Social History   Social History Narrative   Lives with husband.  2 Sons.  Using walker.    Caffeine  Less then 1 cups daily.   Retired.       Objective: Vital Signs: BP 131/82 (BP Location: Left Arm, Patient Position: Sitting, Cuff Size: Large)   Pulse 76   Resp 15   Ht 4\' 11"  (1.499 m)   Wt 231 lb 8 oz (105 kg)   BMI 46.76 kg/m  Physical Exam  Constitutional: She is oriented to person, place, and time. She appears well-developed and well-nourished.  HENT:  Head: Normocephalic and atraumatic.  Eyes: Conjunctivae and EOM are normal.  Neck: Normal range of motion.  Cardiovascular: Normal rate, regular rhythm, normal heart sounds and intact distal pulses.  Pulmonary/Chest: Effort normal and breath sounds normal.  Abdominal: Soft. Bowel sounds are normal.  Lymphadenopathy:    She has no cervical adenopathy.  Neurological: She is alert and oriented to person, place, and time.  Skin: Skin is warm and dry. Capillary refill takes less than 2 seconds.  Psychiatric: She has a normal mood and affect. Her behavior is normal.  Nursing note and vitals reviewed.    Musculoskeletal Exam: C-spine, thoracic spine, lumbar spine good range of motion.  No midline spinal tenderness.  She has mild tenderness of bilateral SI joints.  Right shoulder limited ROM.  Left shoulder, elbow joints, wrist joints, MCPs, PIPs, DIPs good range of motion with no synovitis.  She has synovial thickening of all MCP joints.  Hip joints, knee joints,  ankle joints, MTPs, PIPs, DIPs good range of motion with no synovitis.  She has synovial thickening of bilateral ankles.  No warmth or effusion of bilateral knees.  CDAI Exam: CDAI Homunculus Exam:   Joint Counts:  CDAI Tender Joint count: 0 CDAI Swollen Joint count: 0  Global Assessments:  Patient Global Assessment: 3 Provider Global Assessment: 3  CDAI Calculated Score: 6    Investigation: No additional findings.TB Gold: 12/21/2016 Negative  CBC Latest Ref Rng & Units 12/21/2016 09/13/2016 07/30/2016  WBC 3.8 - 10.8 Thousand/uL 8.0 7.5 7.8  Hemoglobin 11.7 - 15.5 g/dL 11.0(L) 11.5(L) 11.3(L)  Hematocrit 35.0 - 45.0 % 33.4(L) 35.7 35.6(L)  Platelets 140 - 400 Thousand/uL 251 271 264   CMP Latest Ref Rng & Units 12/21/2016 09/13/2016 07/30/2016  Glucose 65 - 139 mg/dL 83 100(H) 81  BUN 7 - 25 mg/dL 26(H) 25 22(H)  Creatinine 0.60 - 0.93 mg/dL 1.22(H) 1.29(H) 1.20(H)  Sodium 135 - 146 mmol/L 141 142 141  Potassium 3.5 - 5.3 mmol/L 4.4 3.9 4.2  Chloride 98 - 110 mmol/L 105 106 106  CO2 20 - 32 mmol/L 29 26 24   Calcium 8.6 - 10.4 mg/dL 8.8 9.1 9.0  Total Protein 6.1 - 8.1 g/dL 7.1 7.3 7.4  Total Bilirubin 0.2 - 1.2 mg/dL 0.4 0.3 0.6  Alkaline Phos 38 - 126 U/L - - 115  AST 10 - 35 U/L 16 15 18   ALT 6 - 29 U/L 9 8 12(L)    Imaging: No results found.  Speciality Comments: Narcotic Agreement 10/30/16    Procedures:  No procedures performed Allergies: No known allergies   Assessment / Plan:     Visit Diagnoses: Rheumatoid arthritis of multiple sites with negative rheumatoid factor ([place]): She has no synovitis on exam.  She has synovial thickening of all MCP joints. She is some tenderness of her right first MCP joint.  She has not had any recent flares.  She will continue on Humira every other week.  She is on any refills at this time.  High risk medication use - Humira 40 mg subcutaneous every other week.  CBC and CMP were ordered today to monitor for drug toxicity.  She will  return in 3 months for lab work.  UDS was updated today.  A refill of tramadol was sent to the pharmacy.  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  History of total knee replacement, left: Doing well.  She has good range of motion.  No warmth or effusion.  Unilateral primary osteoarthritis, right knee: She has been having increased pain in her right knee.  She states that she needs a knee replacement but is not ready at this time.  She walks with a cane helps with her stability.  She has no warmth or effusion on exam.  She has crepitus of her right knee.  Primary osteoarthritis of both feet: No discomfort at this time.  She wears proper fitting shoes.  DDD (degenerative disc disease), lumbar -  s/p discectomy: Chronic pain.  She has been going to a chiropractor which has been helping with her pain and range of motion.  The chiropractor is also been working on her balance.  She continues to walk with a cane.  Other medical conditions are listed as follows:  History of sleep apnea  History of TIA (transient ischemic attack)  History of hyperlipidemia  History of obesity  History of hypertension    Orders: Orders Placed This Encounter  Procedures  . CBC with Differential/Platelet  . COMPLETE METABOLIC PANEL WITH GFR  . Pain Mgmt, Tramadol w/medMATCH, U  . Pain Mgmt, Profile 5 w/Conf, U   Meds ordered this encounter  Medications  . traMADol (ULTRAM) 50 MG tablet    Sig: Take 1-2 tablets by mouth daily as needed for pain relief.    Dispense:  60 tablet    Refill:  0      Follow-Up Instructions: Return in about 5 months (around 08/29/2017) for Rheumatoid arthritis, Osteoarthritis.   Ofilia Neas, PA-C   I examined and evaluated the patient with Hazel Sams PA.  Patient had no synovitis on my exam today.  She had only synovial  thickening.  We will continue treatment with Humira for right now.  The plan of care was discussed as noted above.  Bo Merino, MD  Note - This record has been created using Editor, commissioning.  Chart creation errors have been sought, but may not always  have been located. Such creation errors do not reflect on  the standard of medical care.

## 2017-03-29 ENCOUNTER — Encounter: Payer: Self-pay | Admitting: Rheumatology

## 2017-03-29 ENCOUNTER — Ambulatory Visit: Payer: Medicare Other | Admitting: Rheumatology

## 2017-03-29 VITALS — BP 131/82 | HR 76 | Resp 15 | Ht 59.0 in | Wt 231.5 lb

## 2017-03-29 DIAGNOSIS — Z8673 Personal history of transient ischemic attack (TIA), and cerebral infarction without residual deficits: Secondary | ICD-10-CM

## 2017-03-29 DIAGNOSIS — Z96652 Presence of left artificial knee joint: Secondary | ICD-10-CM

## 2017-03-29 DIAGNOSIS — M0609 Rheumatoid arthritis without rheumatoid factor, multiple sites: Secondary | ICD-10-CM

## 2017-03-29 DIAGNOSIS — M19071 Primary osteoarthritis, right ankle and foot: Secondary | ICD-10-CM

## 2017-03-29 DIAGNOSIS — Z5181 Encounter for therapeutic drug level monitoring: Secondary | ICD-10-CM

## 2017-03-29 DIAGNOSIS — Z79899 Other long term (current) drug therapy: Secondary | ICD-10-CM

## 2017-03-29 DIAGNOSIS — Z8669 Personal history of other diseases of the nervous system and sense organs: Secondary | ICD-10-CM

## 2017-03-29 DIAGNOSIS — Z8639 Personal history of other endocrine, nutritional and metabolic disease: Secondary | ICD-10-CM

## 2017-03-29 DIAGNOSIS — M1711 Unilateral primary osteoarthritis, right knee: Secondary | ICD-10-CM

## 2017-03-29 DIAGNOSIS — M5136 Other intervertebral disc degeneration, lumbar region: Secondary | ICD-10-CM

## 2017-03-29 DIAGNOSIS — Z8679 Personal history of other diseases of the circulatory system: Secondary | ICD-10-CM

## 2017-03-29 DIAGNOSIS — M19072 Primary osteoarthritis, left ankle and foot: Secondary | ICD-10-CM

## 2017-03-29 DIAGNOSIS — M19019 Primary osteoarthritis, unspecified shoulder: Secondary | ICD-10-CM

## 2017-03-29 MED ORDER — TRAMADOL HCL 50 MG PO TABS: ORAL_TABLET | ORAL | 0 refills | Status: DC

## 2017-03-29 NOTE — Patient Instructions (Signed)
Standing Labs We placed an order today for your standing lab work.    Please come back and get your standing labs in June and every 3 months  We have open lab Monday through Friday from 8:30-11:30 AM and 1:30-4:00 PM  at the office of Dr. Shaili Deveshwar.   You may experience shorter wait times on Monday and Friday afternoons. The office is located at [address], [address], [place], [place] [address] No appointment is necessary.   Labs are drawn by [facility].  You may receive a bill from [facility] for your lab work. If you have any questions regarding directions or hours of operation,  please call [phone].    

## 2017-03-31 LAB — PAIN MGMT, PROFILE 5 W/CONF, U
Amphetamines: NEGATIVE ng/mL (ref <500)
Barbiturates: NEGATIVE ng/mL (ref <300)
Benzodiazepines: NEGATIVE ng/mL (ref <100)
Cocaine Metabolite: NEGATIVE ng/mL (ref <150)
Creatinine: 43.9 mg/dL
Marijuana Metabolite: NEGATIVE ng/mL (ref <20)
Methadone Metabolite: NEGATIVE ng/mL (ref <100)
Opiates: NEGATIVE ng/mL (ref <100)
Oxidant: NEGATIVE ug/mL (ref <200)
Oxycodone: NEGATIVE ng/mL (ref <100)
pH: 5.45 (ref 4.5–9.0)

## 2017-03-31 LAB — CBC WITH DIFFERENTIAL/PLATELET
Basophils Absolute: 59 cells/uL (ref 0–200)
Basophils Relative: 0.8 %
Eosinophils Absolute: 118 cells/uL (ref 15–500)
Eosinophils Relative: 1.6 %
HCT: 36.4 % (ref 35.0–45.0)
Hemoglobin: 11.7 g/dL (ref 11.7–15.5)
Lymphs Abs: 2272 cells/uL (ref 850–3900)
MCH: 27.7 pg (ref 27.0–33.0)
MCHC: 32.1 g/dL (ref 32.0–36.0)
MCV: 86.3 fL (ref 80.0–100.0)
MPV: 11.8 fL (ref 7.5–12.5)
Monocytes Relative: 6 %
Neutro Abs: 4507 cells/uL (ref 1500–7800)
Neutrophils Relative %: 60.9 %
Platelets: 278 10*3/uL (ref 140–400)
RBC: 4.22 10*6/uL (ref 3.80–5.10)
RDW: 12.3 % (ref 11.0–15.0)
Total Lymphocyte: 30.7 %
WBC mixed population: 444 cells/uL (ref 200–950)
WBC: 7.4 10*3/uL (ref 3.8–10.8)

## 2017-03-31 LAB — COMPLETE METABOLIC PANEL WITH GFR
AG Ratio: 1.2 (calc) (ref 1.0–2.5)
ALT: 9 U/L (ref 6–29)
AST: 15 U/L (ref 10–35)
Albumin: 4 g/dL (ref 3.6–5.1)
Alkaline phosphatase (APISO): 114 U/L (ref 33–130)
BUN/Creatinine Ratio: 20 (calc) (ref 6–22)
BUN: 25 mg/dL (ref 7–25)
CO2: 29 mmol/L (ref 20–32)
Calcium: 9.7 mg/dL (ref 8.6–10.4)
Chloride: 104 mmol/L (ref 98–110)
Creat: 1.28 mg/dL — ABNORMAL HIGH (ref 0.60–0.93)
GFR, Est African American: 49 mL/min/{1.73_m2} — ABNORMAL LOW (ref >=60)
GFR, Est Non African American: 42 mL/min/{1.73_m2} — ABNORMAL LOW (ref >=60)
Globulin: 3.4 g/dL (calc) (ref 1.9–3.7)
Glucose, Bld: 83 mg/dL (ref 65–99)
Potassium: 4.6 mmol/L (ref 3.5–5.3)
Sodium: 140 mmol/L (ref 135–146)
Total Bilirubin: 0.3 mg/dL (ref 0.2–1.2)
Total Protein: 7.4 g/dL (ref 6.1–8.1)

## 2017-03-31 LAB — PAIN MGMT, TRAMADOL W/MEDMATCH, U
Desmethyltramadol: 2221 ng/mL — ABNORMAL HIGH (ref <100)
Tramadol: 15055 ng/mL — ABNORMAL HIGH (ref <100)

## 2017-04-01 NOTE — Progress Notes (Signed)
CBC WNL.  Creatinine and GFR are stable.  We will continue to monitor.  UDS consistent with treatment.

## 2017-04-04 MED FILL — HUMIRA PEN 40 MG/0.4ML PNKT: 40 | 28 days supply | Qty: 2 | Fill #0

## 2017-04-23 ENCOUNTER — Other Ambulatory Visit: Payer: Self-pay | Admitting: Physician Assistant

## 2017-05-02 MED FILL — HUMIRA PEN 40 MG/0.4ML PNKT: 40 | 28 days supply | Qty: 2 | Fill #1

## 2017-05-06 ENCOUNTER — Other Ambulatory Visit: Payer: Self-pay | Admitting: Physician Assistant

## 2017-05-06 NOTE — Telephone Encounter (Signed)
Last Visit: 03/29/17 Next Visit: 08/30/17 UDS: 03/29/17  Narc Agreement: 10/30/16  Okay to refill Tramadol?  

## 2017-05-30 MED FILL — HUMIRA PEN 40 MG/0.4ML PNKT: 40 | 28 days supply | Qty: 2 | Fill #2

## 2017-06-03 ENCOUNTER — Other Ambulatory Visit: Payer: Self-pay | Admitting: Physician Assistant

## 2017-06-04 NOTE — Telephone Encounter (Signed)
Narcotic agreement: 10/30/16  UDS 03/2217 consistent with treatment   Ok to refill.

## 2017-06-17 ENCOUNTER — Telehealth: Payer: Self-pay | Admitting: Rheumatology

## 2017-06-17 DIAGNOSIS — Z79899 Other long term (current) drug therapy: Secondary | ICD-10-CM

## 2017-06-17 NOTE — Telephone Encounter (Signed)
Lab orders released.  

## 2017-06-17 NOTE — Telephone Encounter (Signed)
Patient requested her labwork orders be sent to [facility] in [place].  Patient is planning on going tomorrow 6/11.

## 2017-06-19 LAB — COMPLETE METABOLIC PANEL WITH GFR
AG Ratio: 1.1 (calc) (ref 1.0–2.5)
ALT: 8 U/L (ref 6–29)
AST: 14 U/L (ref 10–35)
Albumin: 4 g/dL (ref 3.6–5.1)
Alkaline phosphatase (APISO): 107 U/L (ref 33–130)
BUN/Creatinine Ratio: 22 (calc) (ref 6–22)
BUN: 27 mg/dL — ABNORMAL HIGH (ref 7–25)
CO2: 28 mmol/L (ref 20–32)
Calcium: 9.4 mg/dL (ref 8.6–10.4)
Chloride: 105 mmol/L (ref 98–110)
Creat: 1.25 mg/dL — ABNORMAL HIGH (ref 0.60–0.93)
GFR, Est African American: 50 mL/min/{1.73_m2} — ABNORMAL LOW (ref >=60)
GFR, Est Non African American: 43 mL/min/{1.73_m2} — ABNORMAL LOW (ref >=60)
Globulin: 3.5 g/dL (calc) (ref 1.9–3.7)
Glucose, Bld: 77 mg/dL (ref 65–139)
Potassium: 4.5 mmol/L (ref 3.5–5.3)
Sodium: 141 mmol/L (ref 135–146)
Total Bilirubin: 0.3 mg/dL (ref 0.2–1.2)
Total Protein: 7.5 g/dL (ref 6.1–8.1)

## 2017-06-19 LAB — CBC WITH DIFFERENTIAL/PLATELET
Basophils Absolute: 57 cells/uL (ref 0–200)
Basophils Relative: 0.8 %
Eosinophils Absolute: 128 cells/uL (ref 15–500)
Eosinophils Relative: 1.8 %
HCT: 34 % — ABNORMAL LOW (ref 35.0–45.0)
Hemoglobin: 11.2 g/dL — ABNORMAL LOW (ref 11.7–15.5)
Lymphs Abs: 1811 cells/uL (ref 850–3900)
MCH: 28.7 pg (ref 27.0–33.0)
MCHC: 32.9 g/dL (ref 32.0–36.0)
MCV: 87.2 fL (ref 80.0–100.0)
MPV: 11.6 fL (ref 7.5–12.5)
Monocytes Relative: 6.6 %
Neutro Abs: 4636 cells/uL (ref 1500–7800)
Neutrophils Relative %: 65.3 %
Platelets: 273 10*3/uL (ref 140–400)
RBC: 3.9 10*6/uL (ref 3.80–5.10)
RDW: 12.9 % (ref 11.0–15.0)
Total Lymphocyte: 25.5 %
WBC mixed population: 469 cells/uL (ref 200–950)
WBC: 7.1 10*3/uL (ref 3.8–10.8)

## 2017-06-20 ENCOUNTER — Other Ambulatory Visit: Payer: Self-pay | Admitting: Rheumatology

## 2017-06-20 NOTE — Telephone Encounter (Signed)
Labs are stable.

## 2017-06-20 NOTE — Telephone Encounter (Signed)
Last Visit: 03/29/17 Next Visit: 08/30/17 Labs: 06/19/17 stable  TB Gold: 12/21/16  Okay to refill per Dr. Estanislado Pandy

## 2017-06-27 ENCOUNTER — Other Ambulatory Visit: Payer: Self-pay | Admitting: Physician Assistant

## 2017-06-27 NOTE — Telephone Encounter (Signed)
Last Visit: 03/29/17 Next Visit: 08/30/17 UDS: 03/29/17  Narc Agreement: 10/30/16  Okay to refill Tramadol?

## 2017-07-04 MED FILL — HUMIRA PEN 40 MG/0.4ML PNKT: 40 | 28 days supply | Qty: 2 | Fill #0

## 2017-08-06 ENCOUNTER — Other Ambulatory Visit: Payer: Self-pay | Admitting: Physician Assistant

## 2017-08-06 NOTE — Telephone Encounter (Signed)
Last Visit: 03/29/17 Next Visit: 08/30/17 UDS: 03/29/17  Narc Agreement: 10/30/16  Okay to refill Tramadol?

## 2017-08-08 MED FILL — HUMIRA PEN 40 MG/0.4ML PNKT: 40 | 28 days supply | Qty: 2 | Fill #1

## 2017-08-26 NOTE — Progress Notes (Signed)
Office Visit Note  Patient: [name]             Date of Birth: February 28, 1945           MRN: [id number]             PCP: Ann Held, MD Referring: Ann Held, MD Visit Date: 08/30/2017 Occupation: @GUAROCC @  Subjective:  Right thumb pain  History of Present Illness: [name] is a 71 y.o. female with history of seronegative rheumatoid arthritis, osteoarthritis, and DDD.  Patient is on Humira sq injections every 14 days.  She takes tylenol and tramadol 50 mg 1-2 tablets po PRN for pain relief.  She denies any recent rheumatoid arthritis flares.  She feels that Humira has been controlling her rheumatoid arthritis.  She states that she is having some joint pain and mild swelling in her right first MCP joint.  She reports that she has right knee pain occasionally notices occasional swelling.  She states she needs a right knee replacement but is not ready for surgery at this time.  She states her left knee replacement is doing well.  She denies any shoulder pain at this time but continues to have limited range of motion.  She has no feet pain or swelling at this time.  She states she is also having chronic lower back pain which she sees a chiropractor for once a month.  She states she continues to have chronic lower back stiffness.  She states that she has been having muscle cramps at night in her lower extremities.  She states in the past she is tried taking magnesium malate which has helped.   Activities of Daily Living:  Patient reports morning stiffness for 15 minutes.   Patient Reports nocturnal pain.  Difficulty dressing/grooming: Denies Difficulty climbing stairs: Reports Difficulty getting out of chair: Reports Difficulty using hands for taps, buttons, cutlery, and/or writing: Denies  Review of Systems  Constitutional: Positive for fatigue.  HENT: Negative for mouth sores, mouth dryness and nose dryness.   Eyes: Positive for dryness. Negative for pain and  visual disturbance.  Respiratory: Negative for cough, hemoptysis, shortness of breath and difficulty breathing.   Cardiovascular: Positive for palpitations. Negative for chest pain, hypertension and swelling in legs/feet.  Gastrointestinal: Negative for blood in stool, constipation and diarrhea.  Endocrine: Negative for increased urination.  Genitourinary: Negative for painful urination.  Musculoskeletal: Positive for arthralgias, joint pain, joint swelling and morning stiffness. Negative for myalgias, muscle weakness, muscle tenderness and myalgias.  Skin: Positive for rash (on right forearm ) and sensitivity to sunlight. Negative for color change, pallor, hair loss, nodules/bumps, skin tightness and ulcers.  Allergic/Immunologic: Negative for susceptible to infections.  Neurological: Negative for dizziness, numbness, headaches and weakness.  Hematological: Negative for swollen glands.  Psychiatric/Behavioral: Negative for depressed mood and sleep disturbance. The patient is not nervous/anxious.     PMFS History:  Patient Active Problem List   Diagnosis Date Noted  . Closed fracture of shaft of metatarsal bone 07/30/2016  . Metatarsal fracture 07/30/2016  . Displaced fracture of second metatarsal bone, right foot, initial encounter for closed fracture 07/26/2016  . Arthritis of shoulder 04/10/2016  . High risk medication use 03/16/2016  . Primary osteoarthritis of both feet 03/16/2016  . Primary osteoarthritis of both knees 03/16/2016  . DDD (degenerative disc disease), lumbar 03/16/2016  . OSA on CPAP 07/27/2015  . Lip numbness 03/19/2015  . TIA (transient ischemic attack) 03/19/2015  . Infection of  lumbar spine ([place]) 02/06/2015  . Post op infection 02/05/2015  . History of lumbar laminectomy for spinal cord decompression 01/17/2015  . Lumbar spinal stenosis 01/17/2015  . Primary osteoarthritis of left knee 03/25/2014  . Rheumatoid arthritis of multiple sites with negative  rheumatoid factor ([place]) 03/25/2014  . History of total knee replacement, left 03/25/2014  . DERMATITIS 09/28/2008  . CONSTIPATION 05/25/2008  . DYSPNEA ON EXERTION 05/25/2008  . ANEMIA, NORMOCYTIC 04/13/2008  . ANKLE PAIN, BILATERAL 11/25/2007  . HLD (hyperlipidemia) 08/14/2006  . Morbid obesity ([place]) 12/13/2005  . CARPAL TUNNEL SYNDROME 12/13/2005  . PERIPHERAL NEUROPATHY 12/13/2005  . Essential hypertension 12/13/2005  . DYSRHYTHMIA, CARDIAC NOS 12/13/2005    Past Medical History:  Diagnosis Date  . Anemia   . Arthritis    Rheumatoid, followed by Dr. Estanislado Pandy, knees & ankles   . CHF (congestive heart failure) ([place])    pt not aware of this  . Constipation   . GERD (gastroesophageal reflux disease)   . H/O cardiovascular stress test 02/2014   seen by Dr. Einar Gip - told that she was cleared for surgery  . Heart murmur    states she's never had any problems  . Hypertension   . Leg cramps   . Shortness of breath dyspnea    with exertion  . Sleep apnea    CPAP in use- QO night, [facility]- [facility]   . Stroke [facility]) 03/2015   TIA    Family History  Problem Relation Age of Onset  . Seizures Mother   . Transient ischemic attack Mother   . Hypertension Mother   . Diabetes Mother   . Congestive Heart Failure Mother   . Arthritis/Rheumatoid Mother   . Parkinson's disease Mother   . Dementia Mother   . Heart disease Father        CHF  . Stroke Sister   . Depression Sister   . Arthritis Brother   . Arthritis Brother   . Arthritis Son   . Hypertension Son    Past Surgical History:  Procedure Laterality Date  . ABDOMINAL HYSTERECTOMY    . CARPAL TUNNEL RELEASE Right   . COLONOSCOPY    . FOOT SURGERY Bilateral    bunionectomy, calluses , also has several pins & screws  . HAND SURGERY    . JOINT REPLACEMENT     planned for 03/25/2014  . KNEE SURGERY  1990's   . LUMBAR LAMINECTOMY/DECOMPRESSION MICRODISCECTOMY N/A 01/17/2015   Procedure: L2-3, L3-4 Decompression;   Surgeon: [name], MD;  Location: [place];  Service: Orthopedics;  Laterality: N/A;  . LUMBAR LAMINECTOMY/DECOMPRESSION MICRODISCECTOMY N/A 02/06/2015   Procedure: I AND D LUMBAR WITH WOUND VAC PLACEMENT;  Surgeon: [name], MD;  Location: [place];  Service: Orthopedics;  Laterality: N/A;  . ORIF TOE FRACTURE Right 07/30/2016   Procedure: OPEN REDUCTION INTERNAL FIXATION (ORIF) RIGHT 2ND METATARSAL (TOE) FRACTURE;  Surgeon: [name], MD;  Location: [place];  Service: Orthopedics;  Laterality: Right;  . TOTAL KNEE ARTHROPLASTY Left 03/25/2014   Procedure: TOTAL KNEE ARTHROPLASTY;  Surgeon: Garald Balding, MD;  Location: [place];  Service: Orthopedics;  Laterality: Left;  . TUBAL LIGATION     Social History   Social History Narrative   Lives with husband.  2 Sons.  Using walker.    Caffeine  Less then 1 cups daily.   Retired.      Objective: Vital Signs: BP (!) 154/87 (BP Location: Left Arm,  Patient Position: Sitting, Cuff Size: Large)   Pulse 69   Resp 16   Ht 5' (1.524 m)   Wt 235 lb (106.6 kg)   BMI 45.90 kg/m    Physical Exam  Constitutional: She is oriented to person, place, and time. She appears well-developed and well-nourished.  HENT:  Head: Normocephalic and atraumatic.  Eyes: Conjunctivae and EOM are normal.  Neck: Normal range of motion.  Cardiovascular: Normal rate, regular rhythm, normal heart sounds and intact distal pulses.  Pulmonary/Chest: Effort normal and breath sounds normal.  Abdominal: Soft. Bowel sounds are normal.  Lymphadenopathy:    She has no cervical adenopathy.  Neurological: She is alert and oriented to person, place, and time.  Skin: Skin is warm and dry. Capillary refill takes less than 2 seconds.  Psychiatric: She has a normal mood and affect. Her behavior is normal.  Nursing note and vitals reviewed.    Musculoskeletal Exam: C-spine good range of motion.  Limited range of motion of thoracic spine.  No midline spinal tenderness.  No SI  joint tenderness.  Limited range of motion of bilateral shoulder joints.  Elbow joints, wrist joints, MCPs, PIPs and DIPs good range of motion with no synovitis.  Complete fist formation bilaterally.  Synovial thickening of all MCP joints.  Hip joints, knee joints, ankle joints, MTPs, PIPs, DIPs good range of motion with no synovitis.  No warmth or effusion of bilateral knee joints.  Left knee replacement doing well.  No tenderness or swelling of bilateral ankle joints.  Synovial thickening of bilateral ankle joints.  No tenderness of trochanteric bursa bilaterally.   CDAI Exam: CDAI Score: 1.8  Patient Global Assessment: 4 (mm); Provider Global Assessment: 4 (mm) Swollen: 0 ; Tender: 1  Joint Exam      Right  Left  MCP 1   Tender        Investigation: No additional findings.  Imaging: No results found.  Recent Labs: Lab Results  Component Value Date   WBC 7.1 06/19/2017   HGB 11.2 (L) 06/19/2017   PLT 273 06/19/2017   NA 141 06/19/2017   K 4.5 06/19/2017   CL 105 06/19/2017   CO2 28 06/19/2017   GLUCOSE 77 06/19/2017   BUN 27 (H) 06/19/2017   CREATININE 1.25 (H) 06/19/2017   BILITOT 0.3 06/19/2017   ALKPHOS 115 07/30/2016   AST 14 06/19/2017   ALT 8 06/19/2017   PROT 7.5 06/19/2017   ALBUMIN 3.5 07/30/2016   CALCIUM 9.4 06/19/2017   GFRAA 50 (L) 06/19/2017   QFTBGOLDPLUS NEGATIVE 12/21/2016    Speciality Comments: Narcotic Agreement 10/30/16  Procedures:  No procedures performed Allergies: No known allergies   Assessment / Plan:     Visit Diagnoses: Rheumatoid arthritis of multiple sites with negative rheumatoid factor ([place]): She has no synovitis on exam.  She has tenderness of right 1st MCP joint.  Synovial thickening of all MCPs noted.  She has not had any recent rheumatoid arthritis flares.  She has clinically been doing well on Humira 40 mg sq injections every 14 days.  She was reminded to get a yearly influenza vaccine this fall as well as yearly skin  examinations.  She was advised to notify us if she develops increased joint pain or joint swelling.  She will follow up in 5 months.  We will obtain x-rays of hands and feet at next visit.   High risk medication use - Humira, tramadol.  CBC and CMP were ordered today.  She  will return in November and every 3 months for CBC and CMP to monitor for drug toxicity.  TB gold negative 12/21/17. - Plan: CBC with Differential/Platelet, COMPLETE METABOLIC PANEL WITH GFR  Medication monitoring encounter - UDS and narcotic agreement were updated today 08/30/17. Plan: Pain Mgmt, Profile 5 w/Conf, U, Pain Mgmt, Tramadol w/medMATCH, U   Arthritis of shoulder: She has limited ROM of bilateral shoulder joints with no discomfort.    History of total knee replacement, left: Doing well.  No warmth or effusion.  Good ROM with no discomfort.   Unilateral primary osteoarthritis, right knee: No warmth or effusion. Good ROM.  She has chronic pain of the right knee joint.  She knows she needs a right knee replacement but she is not ready for a replacement.   Primary osteoarthritis of both feet: She has no feet pain at this time.  She wears proper fitting shoes.  DDD (degenerative disc disease), lumbar - s/p discectomy: Chronic pain.  She sees a Restaurant manager, fast food once a month.   Other medical conditions are listed as follows:   History of hypertension  History of sleep apnea  History of TIA (transient ischemic attack)  History of obesity  History of hyperlipidemia   Orders: Orders Placed This Encounter  Procedures  . Pain Mgmt, Profile 5 w/Conf, U  . Pain Mgmt, Tramadol w/medMATCH, U  . CBC with Differential/Platelet  . COMPLETE METABOLIC PANEL WITH GFR   No orders of the defined types were placed in this encounter.   Face-to-face time spent with patient was 30 minutes. Greater than 50% of time was spent in counseling and coordination of care.  Follow-Up Instructions: Return in about 5 months (around  01/30/2018) for Rheumatoid arthritis, Osteoarthritis, DDD.   Ofilia Neas, PA-C   I examined and evaluated the patient with Hazel Sams PA.  Patient had no synovitis on my examination.  She is doing well on Humira monotherapy.  She continues to have some discomfort due to underlying osteoarthritis and disc disease.  The plan of care was discussed as noted above.  Bo Merino, MD  Note - This record has been created using Editor, commissioning.  Chart creation errors have been sought, but may not always  have been located. Such creation errors do not reflect on  the standard of medical care.

## 2017-08-30 ENCOUNTER — Encounter: Payer: Self-pay | Admitting: Rheumatology

## 2017-08-30 ENCOUNTER — Ambulatory Visit: Payer: Medicare Other | Admitting: Rheumatology

## 2017-08-30 VITALS — BP 154/87 | HR 69 | Resp 16 | Ht 60.0 in | Wt 235.0 lb

## 2017-08-30 DIAGNOSIS — Z96652 Presence of left artificial knee joint: Secondary | ICD-10-CM | POA: Diagnosis not present

## 2017-08-30 DIAGNOSIS — Z8679 Personal history of other diseases of the circulatory system: Secondary | ICD-10-CM

## 2017-08-30 DIAGNOSIS — M19071 Primary osteoarthritis, right ankle and foot: Secondary | ICD-10-CM

## 2017-08-30 DIAGNOSIS — M19019 Primary osteoarthritis, unspecified shoulder: Secondary | ICD-10-CM | POA: Diagnosis not present

## 2017-08-30 DIAGNOSIS — M0609 Rheumatoid arthritis without rheumatoid factor, multiple sites: Secondary | ICD-10-CM | POA: Diagnosis not present

## 2017-08-30 DIAGNOSIS — Z79899 Other long term (current) drug therapy: Secondary | ICD-10-CM | POA: Diagnosis not present

## 2017-08-30 DIAGNOSIS — Z5181 Encounter for therapeutic drug level monitoring: Secondary | ICD-10-CM

## 2017-08-30 DIAGNOSIS — Z8639 Personal history of other endocrine, nutritional and metabolic disease: Secondary | ICD-10-CM

## 2017-08-30 DIAGNOSIS — M5136 Other intervertebral disc degeneration, lumbar region: Secondary | ICD-10-CM

## 2017-08-30 DIAGNOSIS — Z8669 Personal history of other diseases of the nervous system and sense organs: Secondary | ICD-10-CM

## 2017-08-30 DIAGNOSIS — M19072 Primary osteoarthritis, left ankle and foot: Secondary | ICD-10-CM

## 2017-08-30 DIAGNOSIS — M1711 Unilateral primary osteoarthritis, right knee: Secondary | ICD-10-CM

## 2017-08-30 DIAGNOSIS — Z8673 Personal history of transient ischemic attack (TIA), and cerebral infarction without residual deficits: Secondary | ICD-10-CM

## 2017-08-30 NOTE — Patient Instructions (Signed)
Standing Labs We placed an order today for your standing lab work.    Please come back and get your standing labs in November and every 3 months   We have open lab Monday through Friday from 8:30-11:30 AM and 1:30-4:00 PM  at the office of Dr. Shaili Deveshwar.   You may experience shorter wait times on Monday and Friday afternoons. The office is located at [address], [address], [place], [place] [address] No appointment is necessary.   Labs are drawn by [facility].  You may receive a bill from [facility] for your lab work. If you have any questions regarding directions or hours of operation,  please call [phone].     

## 2017-09-02 LAB — PAIN MGMT, PROFILE 5 W/CONF, U
Amphetamines: NEGATIVE ng/mL (ref <500)
Barbiturates: NEGATIVE ng/mL (ref <300)
Benzodiazepines: NEGATIVE ng/mL (ref <100)
Cocaine Metabolite: NEGATIVE ng/mL (ref <150)
Codeine: NEGATIVE ng/mL (ref <50)
Creatinine: 106.9 mg/dL
Hydrocodone: 73 ng/mL — ABNORMAL HIGH (ref <50)
Hydromorphone: NEGATIVE ng/mL (ref <50)
Marijuana Metabolite: NEGATIVE ng/mL (ref <20)
Methadone Metabolite: NEGATIVE ng/mL (ref <100)
Morphine: NEGATIVE ng/mL (ref <50)
Norhydrocodone: 221 ng/mL — ABNORMAL HIGH (ref <50)
Opiates: POSITIVE ng/mL — AB (ref <100)
Oxidant: NEGATIVE ug/mL (ref <200)
Oxycodone: NEGATIVE ng/mL (ref <100)
pH: 5.88 (ref 4.5–9.0)

## 2017-09-02 LAB — COMPLETE METABOLIC PANEL WITH GFR
AG Ratio: 1.1 (calc) (ref 1.0–2.5)
ALT: 8 U/L (ref 6–29)
AST: 14 U/L (ref 10–35)
Albumin: 3.9 g/dL (ref 3.6–5.1)
Alkaline phosphatase (APISO): 99 U/L (ref 33–130)
BUN/Creatinine Ratio: 16 (calc) (ref 6–22)
BUN: 26 mg/dL — ABNORMAL HIGH (ref 7–25)
CO2: 29 mmol/L (ref 20–32)
Calcium: 9.2 mg/dL (ref 8.6–10.4)
Chloride: 105 mmol/L (ref 98–110)
Creat: 1.64 mg/dL — ABNORMAL HIGH (ref 0.60–0.93)
GFR, Est African American: 36 mL/min/{1.73_m2} — ABNORMAL LOW (ref >=60)
GFR, Est Non African American: 31 mL/min/{1.73_m2} — ABNORMAL LOW (ref >=60)
Globulin: 3.4 g/dL (calc) (ref 1.9–3.7)
Glucose, Bld: 79 mg/dL (ref 65–99)
Potassium: 4.6 mmol/L (ref 3.5–5.3)
Sodium: 143 mmol/L (ref 135–146)
Total Bilirubin: 0.4 mg/dL (ref 0.2–1.2)
Total Protein: 7.3 g/dL (ref 6.1–8.1)

## 2017-09-02 LAB — CBC WITH DIFFERENTIAL/PLATELET
Basophils Absolute: 79 cells/uL (ref 0–200)
Basophils Relative: 1.1 %
Eosinophils Absolute: 252 cells/uL (ref 15–500)
Eosinophils Relative: 3.5 %
HCT: 35.5 % (ref 35.0–45.0)
Hemoglobin: 11.5 g/dL — ABNORMAL LOW (ref 11.7–15.5)
Lymphs Abs: 2434 cells/uL (ref 850–3900)
MCH: 28.7 pg (ref 27.0–33.0)
MCHC: 32.4 g/dL (ref 32.0–36.0)
MCV: 88.5 fL (ref 80.0–100.0)
MPV: 11.3 fL (ref 7.5–12.5)
Monocytes Relative: 8.5 %
Neutro Abs: 3823 cells/uL (ref 1500–7800)
Neutrophils Relative %: 53.1 %
Platelets: 243 10*3/uL (ref 140–400)
RBC: 4.01 10*6/uL (ref 3.80–5.10)
RDW: 11.9 % (ref 11.0–15.0)
Total Lymphocyte: 33.8 %
WBC mixed population: 612 cells/uL (ref 200–950)
WBC: 7.2 10*3/uL (ref 3.8–10.8)

## 2017-09-02 LAB — PAIN MGMT, TRAMADOL W/MEDMATCH, U
Desmethyltramadol: 5766 ng/mL — ABNORMAL HIGH (ref <100)
Tramadol: >10000 ng/mL — ABNORMAL HIGH (ref <100)

## 2017-09-02 NOTE — Progress Notes (Signed)
Please check [place] database to check when she was last prescribed these medications.

## 2017-09-02 NOTE — Progress Notes (Signed)
UDS is inconsistent with treatment.  She is prescribed tramadol only.  Please clarify what pain medications this patient is taking and who prescribes it.  We will not be able to refill tramadol if she is receiving narcotics from another provider. Creatinine elevated and GFR low. Please ask if pt sees a nephrologist. Forward to PCP and place referral to nephrology. She would avoid all NSAIDs.

## 2017-09-04 ENCOUNTER — Other Ambulatory Visit: Payer: Self-pay | Admitting: Physician Assistant

## 2017-09-04 MED FILL — HUMIRA PEN 40 MG/0.4ML PNKT: 40 | 28 days supply | Qty: 2 | Fill #2

## 2017-09-04 NOTE — Telephone Encounter (Signed)
Last Visit: 08/30/17 Next visit: 01/31/18 UDS: 08/30/17 Narc Agreement: 08/30/17  Okay to refill Tramadol?

## 2017-09-26 ENCOUNTER — Other Ambulatory Visit: Payer: Self-pay | Admitting: Rheumatology

## 2017-09-26 NOTE — Telephone Encounter (Signed)
Last Visit: 08/30/17 Next visit: 01/31/18 Labs: 08/30/17 Creatinine elevated and GFR low. TB Gold: 12/21/16 Neg  Okay to refill per Dr. Estanislado Pandy

## 2017-10-01 ENCOUNTER — Other Ambulatory Visit: Payer: Self-pay | Admitting: Physician Assistant

## 2017-10-01 NOTE — Telephone Encounter (Signed)
Last Visit: 08/30/17 Next visit: 01/31/18 UDS: 08/30/17 Narc Agreement: 08/30/17  Last Fill: 09/04/17  Okay to refill Tramadol?

## 2017-10-02 MED FILL — HUMIRA PEN 40 MG/0.4ML PNKT: 40 | 28 days supply | Qty: 2 | Fill #0

## 2017-10-23 ENCOUNTER — Other Ambulatory Visit (HOSPITAL_COMMUNITY): Payer: Self-pay | Admitting: Family Medicine

## 2017-10-23 DIAGNOSIS — Z1231 Encounter for screening mammogram for malignant neoplasm of breast: Secondary | ICD-10-CM

## 2017-10-31 MED FILL — HUMIRA PEN 40 MG/0.4ML PNKT: 40 | 28 days supply | Qty: 2 | Fill #1

## 2017-11-02 ENCOUNTER — Other Ambulatory Visit: Payer: Self-pay | Admitting: Physician Assistant

## 2017-11-04 NOTE — Telephone Encounter (Signed)
Last Visit: 08/30/17 Next visit: 01/31/18 UDS: 08/30/17 Narc Agreement: 08/30/17  Last Fill: 10/01/17  Okay to refill Tramadol?

## 2017-11-14 ENCOUNTER — Telehealth: Payer: Self-pay | Admitting: Pharmacist

## 2017-11-14 NOTE — Telephone Encounter (Signed)
Rheumatology Pharmacist Encounter  Follow-Up Form  Called patient today to follow up regarding patient's rheumatology medication: Humira  Patient is tolerating the medication well with minimal side effects.  She reports an injection site reaction which she describes as itchy.  States it does not last a full day and is tolerable.  Reviewed management strategies of using an antihistamine such as Claritin the day before, of, and after injection as well as applying hydro-cortisone cream.  Patient denies any new medical conditions/medications/allergies.  Denies missing doses. Denies any RA  flares.  Reports morning stiffness, some joint pain, and back pain.  Patient asked when next lab work was due and asked if we could send them closer to her home.  Informed patient that her next CBC/CMP due around 11/23.  Instructed patient to call day before she wants lab work done and we can send to the lab of her choice.  Last TB gold 12/25/17.  Will continue to monitor.  Patient knows to call the office with questions or concerns. Rheumatology Clinic will continue to follow.  Mariella Saa, PharmD, [facility] Rheumatology Clinical Pharmacist  11/14/2017 11:11 AM

## 2017-11-14 NOTE — Telephone Encounter (Signed)
Thank you for informing me.

## 2017-11-26 ENCOUNTER — Telehealth: Payer: Self-pay | Admitting: Rheumatology

## 2017-11-26 DIAGNOSIS — Z79899 Other long term (current) drug therapy: Secondary | ICD-10-CM

## 2017-11-26 DIAGNOSIS — Z9225 Personal history of immunosupression therapy: Secondary | ICD-10-CM

## 2017-11-26 NOTE — Telephone Encounter (Signed)
Lab orders released.  

## 2017-11-26 NOTE — Telephone Encounter (Signed)
Patient left message this am stating she has an appt for labs at [facility] in [place], on Friday 11/29/17. Please release orders.

## 2017-11-27 ENCOUNTER — Telehealth: Payer: Self-pay | Admitting: Rheumatology

## 2017-11-27 NOTE — Telephone Encounter (Signed)
Patient would like to know when she is due next for labs. Please call to advise.

## 2017-11-27 NOTE — Telephone Encounter (Signed)
Patient advised she is due for labs and the orders have been released. Patient will have them done.

## 2017-11-29 NOTE — Telephone Encounter (Signed)
Labs are stable.

## 2017-11-30 LAB — COMPLETE METABOLIC PANEL WITH GFR
AG Ratio: 1.2 (calc) (ref 1.0–2.5)
ALT: 9 U/L (ref 6–29)
AST: 17 U/L (ref 10–35)
Albumin: 4 g/dL (ref 3.6–5.1)
Alkaline phosphatase (APISO): 96 U/L (ref 33–130)
BUN/Creatinine Ratio: 16 (calc) (ref 6–22)
BUN: 21 mg/dL (ref 7–25)
CO2: 26 mmol/L (ref 20–32)
Calcium: 9.4 mg/dL (ref 8.6–10.4)
Chloride: 106 mmol/L (ref 98–110)
Creat: 1.31 mg/dL — ABNORMAL HIGH (ref 0.60–0.93)
GFR, Est African American: 47 mL/min/{1.73_m2} — ABNORMAL LOW (ref >=60)
GFR, Est Non African American: 41 mL/min/{1.73_m2} — ABNORMAL LOW (ref >=60)
Globulin: 3.3 g/dL (calc) (ref 1.9–3.7)
Glucose, Bld: 86 mg/dL (ref 65–139)
Potassium: 4 mmol/L (ref 3.5–5.3)
Sodium: 142 mmol/L (ref 135–146)
Total Bilirubin: 0.3 mg/dL (ref 0.2–1.2)
Total Protein: 7.3 g/dL (ref 6.1–8.1)

## 2017-11-30 LAB — CBC WITH DIFFERENTIAL/PLATELET
Basophils Absolute: 63 cells/uL (ref 0–200)
Basophils Relative: 0.8 %
Eosinophils Absolute: 119 cells/uL (ref 15–500)
Eosinophils Relative: 1.5 %
HCT: 34.9 % — ABNORMAL LOW (ref 35.0–45.0)
Hemoglobin: 11.3 g/dL — ABNORMAL LOW (ref 11.7–15.5)
Lymphs Abs: 2259 cells/uL (ref 850–3900)
MCH: 28.8 pg (ref 27.0–33.0)
MCHC: 32.4 g/dL (ref 32.0–36.0)
MCV: 88.8 fL (ref 80.0–100.0)
MPV: 11.8 fL (ref 7.5–12.5)
Monocytes Relative: 4.8 %
Neutro Abs: 5080 cells/uL (ref 1500–7800)
Neutrophils Relative %: 64.3 %
Platelets: 276 10*3/uL (ref 140–400)
RBC: 3.93 10*6/uL (ref 3.80–5.10)
RDW: 12.8 % (ref 11.0–15.0)
Total Lymphocyte: 28.6 %
WBC mixed population: 379 cells/uL (ref 200–950)
WBC: 7.9 10*3/uL (ref 3.8–10.8)

## 2017-11-30 LAB — QUANTIFERON-TB GOLD PLUS
Mitogen-NIL: >10 IU/mL
NIL: 0.02 IU/mL
QuantiFERON-TB Gold Plus: NEGATIVE
TB1-NIL: 0.04 IU/mL
TB2-NIL: 0.04 IU/mL

## 2017-12-02 ENCOUNTER — Other Ambulatory Visit: Payer: Self-pay | Admitting: Physician Assistant

## 2017-12-03 NOTE — Telephone Encounter (Signed)
Last Visit: 08/30/17 Next visit: 01/31/18 UDS: 08/30/17 Narc Agreement: 08/30/17  Last Fill: 11/04/17  Okay to refill Tramadol?

## 2017-12-11 ENCOUNTER — Ambulatory Visit (HOSPITAL_COMMUNITY): Payer: Medicare Other

## 2017-12-12 ENCOUNTER — Other Ambulatory Visit: Payer: Self-pay

## 2017-12-12 ENCOUNTER — Emergency Department (HOSPITAL_COMMUNITY)
Admission: EM | Admit: 2017-12-12 | Discharge: 2017-12-12 | Disposition: A | Discharge status: Home / Self Care | Payer: Medicare Other | Attending: Emergency Medicine | Admitting: Emergency Medicine

## 2017-12-12 ENCOUNTER — Encounter (HOSPITAL_COMMUNITY): Payer: Self-pay | Admitting: *Deleted

## 2017-12-12 DIAGNOSIS — I509 Heart failure, unspecified: Secondary | ICD-10-CM | POA: Diagnosis not present

## 2017-12-12 DIAGNOSIS — R600 Localized edema: Secondary | ICD-10-CM | POA: Diagnosis not present

## 2017-12-12 DIAGNOSIS — I110 Hypertensive heart disease with heart failure: Secondary | ICD-10-CM | POA: Insufficient documentation

## 2017-12-12 DIAGNOSIS — Z87891 Personal history of nicotine dependence: Secondary | ICD-10-CM | POA: Insufficient documentation

## 2017-12-12 DIAGNOSIS — Z79899 Other long term (current) drug therapy: Secondary | ICD-10-CM | POA: Diagnosis not present

## 2017-12-12 DIAGNOSIS — T7840XA Allergy, unspecified, initial encounter: Secondary | ICD-10-CM | POA: Diagnosis not present

## 2017-12-12 MED ORDER — FAMOTIDINE 20 MG PO TABS: 20.0000 mg | ORAL_TABLET | Freq: Two times a day (BID) | ORAL | 0 refills | Status: AC

## 2017-12-12 MED ORDER — FAMOTIDINE IN NACL 20-0.9 MG/50ML-% IV SOLN
20.0000 mg | Freq: Once | INTRAVENOUS | Status: AC
  Administered 2017-12-12: 20 mg via INTRAVENOUS
  Filled 2017-12-12: qty 50

## 2017-12-12 MED ORDER — PREDNISONE 20 MG PO TABS: ORAL_TABLET | ORAL | 0 refills | Status: DC

## 2017-12-12 MED ORDER — METHYLPREDNISOLONE SODIUM SUCC 125 MG IJ SOLR
125.0000 mg | Freq: Once | INTRAMUSCULAR | Status: AC
  Administered 2017-12-12: 125 mg via INTRAVENOUS
  Filled 2017-12-12: qty 2

## 2017-12-12 MED ORDER — DIPHENHYDRAMINE HCL 50 MG/ML IJ SOLN
25.0000 mg | Freq: Once | INTRAMUSCULAR | Status: AC
  Administered 2017-12-12: 25 mg via INTRAVENOUS
  Filled 2017-12-12: qty 1

## 2017-12-12 MED FILL — HUMIRA PEN 40 MG/0.4ML PNKT: 40 | 28 days supply | Qty: 2 | Fill #2

## 2017-12-12 NOTE — ED Provider Notes (Signed)
[facility] EMERGENCY DEPARTMENT Provider Note   CSN: [id number] Arrival date & time: 12/12/17  5462     History   Chief Complaint Chief Complaint  Patient presents with  . Oral Swelling    HPI [name] is a 72 y.o. female.  Patient complains of swelling to her upper lip.  It started this morning on the left side of her lip and now with moving over to the right side to.  No other complaints  The history is provided by the patient. No language interpreter was used.  Illness  This is a new problem. The current episode started 6 to 12 hours ago. The problem occurs constantly. The problem has been gradually worsening. Associated symptoms include chest pain. Pertinent negatives include no abdominal pain and no headaches. Nothing aggravates the symptoms. Nothing relieves the symptoms. She has tried nothing for the symptoms. The treatment provided no relief.    Past Medical History:  Diagnosis Date  . Anemia   . Arthritis    Rheumatoid, followed by Dr. Estanislado Pandy, knees & ankles   . CHF (congestive heart failure) ([place])    pt not aware of this  . Constipation   . GERD (gastroesophageal reflux disease)   . H/O cardiovascular stress test 02/2014   seen by Dr. Einar Gip - told that she was cleared for surgery  . Heart murmur    states she's never had any problems  . Hypertension   . Leg cramps   . Shortness of breath dyspnea    with exertion  . Sleep apnea    CPAP in use- QO night, study done at [facility]- 2007   . Stroke [facility]) 03/2015   TIA    Patient Active Problem List   Diagnosis Date Noted  . Closed fracture of shaft of metatarsal bone 07/30/2016  . Metatarsal fracture 07/30/2016  . Displaced fracture of second metatarsal bone, right foot, initial encounter for closed fracture 07/26/2016  . Arthritis of shoulder 04/10/2016  . High risk medication use 03/16/2016  . Primary osteoarthritis of both feet 03/16/2016  . Primary osteoarthritis of both knees 03/16/2016  . DDD  (degenerative disc disease), lumbar 03/16/2016  . OSA on CPAP 07/27/2015  . Lip numbness 03/19/2015  . TIA (transient ischemic attack) 03/19/2015  . 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 Post op infection 02/05/2015  . History of lumbar laminectomy for spinal cord decompression 01/17/2015  . Lumbar spinal stenosis 01/17/2015  . Primary osteoarthritis of left knee 03/25/2014  . 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 History of total knee replacement, left 03/25/2014  . DERMATITIS 09/28/2008  . CONSTIPATION 05/25/2008  . DYSPNEA ON EXERTION 05/25/2008  . ANEMIA, NORMOCYTIC 04/13/2008  . ANKLE PAIN, BILATERAL 11/25/2007  . HLD (hyperlipidemia) 08/14/2006  . Morbid obesity ([place]) 12/13/2005  . CARPAL TUNNEL SYNDROME 12/13/2005  . PERIPHERAL NEUROPATHY 12/13/2005  . Essential hypertension 12/13/2005  . DYSRHYTHMIA, CARDIAC NOS 12/13/2005    Past Surgical History:  Procedure Laterality Date  . ABDOMINAL HYSTERECTOMY    . CARPAL TUNNEL RELEASE Right   . COLONOSCOPY    . FOOT SURGERY Bilateral    bunionectomy, calluses , also has several pins & screws  . HAND SURGERY    . JOINT REPLACEMENT     planned for 03/25/2014  . KNEE SURGERY  1990's   . LUMBAR LAMINECTOMY/DECOMPRESSION MICRODISCECTOMY N/A 01/17/2015   Procedure: L2-3, L3-4 Decompression;  Surgeon: [name], MD;  Location: [facility]  OR;  Service: Orthopedics;  Laterality: N/A;  . LUMBAR LAMINECTOMY/DECOMPRESSION MICRODISCECTOMY N/A 02/06/2015   Procedure: I AND D LUMBAR WITH WOUND VAC PLACEMENT;  Surgeon: [name], MD;  Location: [place];  Service: Orthopedics;  Laterality: N/A;  . ORIF TOE FRACTURE Right 07/30/2016   Procedure: OPEN REDUCTION INTERNAL FIXATION (ORIF) RIGHT 2ND METATARSAL (TOE) FRACTURE;  Surgeon: [name], MD;  Location: [place];  Service: Orthopedics;  Laterality: Right;  . TOTAL KNEE ARTHROPLASTY Left 03/25/2014   Procedure: TOTAL KNEE ARTHROPLASTY;   Surgeon: Garald Balding, MD;  Location: [place];  Service: Orthopedics;  Laterality: Left;  . TUBAL LIGATION       OB History   None      Home Medications    Prior to Admission medications   Medication Sig Start Date End Date Taking? Authorizing Provider  Acetaminophen (TYLENOL PO) Take by mouth daily.   Yes [provider]  docusate sodium (STOOL SOFTENER) 100 MG capsule Take 200-300 mg by mouth daily as needed for mild constipation.    Yes [provider]  furosemide (LASIX) 20 MG tablet 2 (two) times daily. 03/15/17  Yes [provider]  HUMIRA PEN 40 MG/0.4ML PNKT INJECT 40 MG INTO THE SKIN EVERY 14 (FOURTEEN) DAYS. 09/26/17  Yes Deveshwar, Abel Presto, MD  Multiple Vitamin (MULTIVITAMIN WITH MINERALS) TABS tablet Take 1 tablet by mouth daily.   Yes [provider]  ramipril (ALTACE) 2.5 MG capsule Take 2.5 mg by mouth daily.   Yes [provider]  ranitidine (ZANTAC) 150 MG tablet Take 150 mg by mouth daily.   Yes [provider]  simvastatin (ZOCOR) 10 MG tablet Take 10 mg by mouth daily at 6 PM.   Yes [provider]  traMADol (ULTRAM) 50 MG tablet TAKE 1 TO 2 TABLETS DAILY AS NEEDED FOR PAIN 12/03/17  Yes Ofilia Neas, PA-C  famotidine (PEPCID) 20 MG tablet Take 1 tablet (20 mg total) by mouth 2 (two) times daily. 12/12/17   Milton Ferguson, MD  predniSONE (DELTASONE) 20 MG tablet 2 tabs po daily x 3 days 12/12/17   Milton Ferguson, MD    Family History Family History  Problem Relation Age of Onset  . Seizures Mother   . Transient ischemic attack Mother   . Hypertension Mother   . Diabetes Mother   . Congestive Heart Failure Mother   . Arthritis/Rheumatoid Mother   . Parkinson's disease Mother   . Dementia Mother   . Heart disease Father        CHF  . Stroke Sister   . Depression Sister   . Arthritis Brother   . Arthritis Brother   . Arthritis Son   . Hypertension Son     Social History Social History    Tobacco Use  . Smoking status: Former Smoker    Last attempt to quit: 03/16/1980    Years since quitting: 37.7  . Smokeless tobacco: Never Used  Substance Use Topics  . Alcohol use: No  . Drug use: No     Allergies   No known allergies   Review of Systems Review of Systems  Constitutional: Negative for appetite change and fatigue.  HENT: Negative for congestion, ear discharge and sinus pressure.        Facial swelling  Eyes: Negative for discharge.  Respiratory: Negative for cough.   Cardiovascular: Positive for chest pain.  Gastrointestinal: Negative for abdominal pain and diarrhea.  Genitourinary: Negative for frequency and hematuria.  Musculoskeletal: Negative for back pain.  Skin: Negative for rash.  Neurological: Negative for seizures and headaches.  Psychiatric/Behavioral: Negative for hallucinations.     Physical Exam Updated Vital Signs BP 134/83   Pulse 72   Temp 98.3 F (36.8 C) (Oral)   Resp 18   Ht 4\' 11"  (1.499 m)   Wt 98.4 kg   SpO2 98%   BMI 43.83 kg/m   Physical Exam  Constitutional: She is oriented to person, place, and time. She appears well-developed.  HENT:  Head: Normocephalic.  Patient has swelling to left side of her lip and midportion of her upper lip.  Oropharynx otherwise negative  Eyes: Conjunctivae and EOM are normal. No scleral icterus.  Neck: Neck supple. No thyromegaly present.  Cardiovascular: Normal rate and regular rhythm. Exam reveals no gallop and no friction rub.  No murmur heard. Pulmonary/Chest: No stridor. She has no wheezes. She has no rales. She exhibits no tenderness.  Abdominal: She exhibits no distension. There is no tenderness. There is no rebound.  Musculoskeletal: Normal range of motion. She exhibits no edema.  Lymphadenopathy:    She has no cervical adenopathy.  Neurological: She is oriented to person, place, and time. She exhibits normal muscle tone. Coordination normal.  Skin: No rash noted. No erythema.   Psychiatric: She has a normal mood and affect. Her behavior is normal.     ED Treatments / Results  Labs (all labs ordered are listed, but only abnormal results are displayed) Labs Reviewed - No data to display  EKG None  Radiology No results found.  Procedures Procedures (including critical care time)  Medications Ordered in ED Medications  diphenhydrAMINE (BENADRYL) injection 25 mg (25 mg Intravenous Given 12/12/17 1003)  famotidine (PEPCID) IVPB 20 mg premix (0 mg Intravenous Stopped 12/12/17 1035)  methylPREDNISolone sodium succinate (SOLU-MEDROL) 125 mg/2 mL injection 125 mg (125 mg Intravenous Given 12/12/17 1003)  diphenhydrAMINE (BENADRYL) injection 25 mg (25 mg Intravenous Given 12/12/17 1155)     Initial Impression / Assessment and Plan / ED Course  I have reviewed the triage vital signs and the nursing notes.  Pertinent labs & imaging results that were available during my care of the patient were reviewed by me and considered in my medical decision making (see chart for details).     Patient was treated for allergic reaction most likely secondary to the Altace she is taking.  Initially she got slightly worse and the swelling started on the right side of her upper lip.  But over time the swelling decreased on both sides of her lip.  Oropharynx otherwise was negative.  Patient was sent home with Pepcid prednisone and told to take Benadryl 4 times a day.  She will follow-up with her doctor next week and she is told to stop taking her Altace Final Clinical Impressions(s) / ED Diagnoses   Final diagnoses:  Allergic reaction, initial encounter    ED Discharge Orders         Ordered    predniSONE (DELTASONE) 20 MG tablet     12/12/17 1335    famotidine (PEPCID) 20 MG tablet  2 times daily     12/12/17 1335           Milton Ferguson, MD 12/12/17 1341

## 2017-12-12 NOTE — ED Triage Notes (Signed)
Pt reports she woke up at 0800 this morning with left sided facial swelling and upper lip swelling. Pt reports "my tongue feels funny". Pt denies SOB. Pt is on Ramipril.

## 2017-12-12 NOTE — Discharge Instructions (Addendum)
Stop taking your Altase blood pressure medicine which is also called ramipril.  Take Benadryl 25 mg every 4-6 hours for swelling or itching.  Follow-up with your doctor next week.  Return here sooner if any problems

## 2017-12-19 ENCOUNTER — Encounter (HOSPITAL_COMMUNITY): Payer: Self-pay

## 2017-12-19 ENCOUNTER — Ambulatory Visit (HOSPITAL_COMMUNITY)
Admission: RE | Admit: 2017-12-19 | Discharge: 2017-12-19 | Disposition: A | Discharge status: Home / Self Care | Payer: Medicare Other | Source: Ambulatory Visit | Attending: Family Medicine | Admitting: Family Medicine

## 2017-12-19 DIAGNOSIS — Z1231 Encounter for screening mammogram for malignant neoplasm of breast: Secondary | ICD-10-CM | POA: Insufficient documentation

## 2017-12-30 ENCOUNTER — Other Ambulatory Visit: Payer: Self-pay | Admitting: Rheumatology

## 2017-12-30 ENCOUNTER — Other Ambulatory Visit: Payer: Self-pay | Admitting: Physician Assistant

## 2017-12-30 NOTE — Telephone Encounter (Signed)
Last Visit: 08/30/17 Next visit: 01/31/18 UDS: 08/30/17 Narc Agreement: 08/30/17  Last Fill:12/03/17  Okay to refill Tramadol?

## 2017-12-30 NOTE — Telephone Encounter (Signed)
Last Visit: 08/30/17 Next visit: 01/31/18 Labs: 11/28/17 Stable TB Gold: 11/28/17 Neg   Okay to refill per Dr. Estanislado Pandy

## 2018-01-02 NOTE — Telephone Encounter (Signed)
ok 

## 2018-01-06 MED FILL — HUMIRA PEN 40 MG/0.4ML PNKT: 40 | 28 days supply | Qty: 2 | Fill #0

## 2018-01-17 NOTE — Progress Notes (Signed)
Office Visit Note  Patient: [name]             Date of Birth: Aug 28, 1945           MRN: [id number]             PCP: Ann Held, MD Referring: Ann Held, MD Visit Date: 01/31/2018 Occupation: @GUAROCC @  Subjective:  Left middle trigger finger    History of Present Illness: [name] is a 73 y.o. female with history of seronegative rheumatoid arthritis, osteoarthritis, and DDD. She is on Humira 40 mg sq injections every 14 days. She takes Tramadol 50 mg 1-2 tablets PRN for pain relief.  She denies any recent rheumatoid arthritis flares.  She states that she has been having some stiffness in her hands.  She states that her morning stiffness typically lasts about 15 minutes.  She states that she has a left middle trigger finger that has been locking up more frequently.  She denies any joint swelling at this time.  She states she has occasional ankle pain but no joint swelling.  She has chronic lower back pain.  She states her left knee replacement is doing well and she continues to walk with a cane.  She denies missing any doses of Humira recently.  Her last dose was on Tuesday.  She denies any recent infections.  She states that she received the annual influenza vaccination.  She continues to get yearly skin exams while on Humira.     Activities of Daily Living:  Patient reports morning stiffness for 15  minutes.   Patient Denies nocturnal pain.  Difficulty dressing/grooming: Denies Difficulty climbing stairs: Reports Difficulty getting out of chair: Reports Difficulty using hands for taps, buttons, cutlery, and/or writing: Denies  Review of Systems  Constitutional: Negative for fatigue.  HENT: Positive for mouth dryness. Negative for mouth sores and nose dryness.   Eyes: Negative for pain, visual disturbance and dryness.  Respiratory: Negative for cough, hemoptysis, shortness of breath and difficulty breathing.   Cardiovascular: Negative for chest pain,  palpitations, hypertension and swelling in legs/feet.  Gastrointestinal: Negative for blood in stool, constipation and diarrhea.  Endocrine: Negative for increased urination.  Genitourinary: Negative for painful urination.  Musculoskeletal: Positive for arthralgias, joint pain and morning stiffness. Negative for joint swelling, myalgias, muscle weakness, muscle tenderness and myalgias.  Skin: Negative for color change, pallor, rash, hair loss, nodules/bumps, skin tightness, ulcers and sensitivity to sunlight.  Allergic/Immunologic: Negative for susceptible to infections.  Neurological: Negative for dizziness, numbness, headaches and weakness.  Hematological: Negative for swollen glands.  Psychiatric/Behavioral: Negative for depressed mood and sleep disturbance. The patient is not nervous/anxious.     PMFS History:  Patient Active Problem List   Diagnosis Date Noted  . Closed fracture of shaft of metatarsal bone 07/30/2016  . Metatarsal fracture 07/30/2016  . Displaced fracture of second metatarsal bone, right foot, initial encounter for closed fracture 07/26/2016  . Arthritis of shoulder 04/10/2016  . High risk medication use 03/16/2016  . Primary osteoarthritis of both feet 03/16/2016  . Primary osteoarthritis of both knees 03/16/2016  . DDD (degenerative disc disease), lumbar 03/16/2016  . OSA on CPAP 07/27/2015  . Lip numbness 03/19/2015  . TIA (transient ischemic attack) 03/19/2015  . 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[place]) 02/06/2015  . Post op infection 02/05/2015  . History of lumbar laminectomy for spinal cord decompression 01/17/2015  . Lumbar spinal stenosis 01/17/2015  . Primary osteoarthritis of left knee 03/25/2014  .  Rheumatoid arthritis of multiple sites with negative rheumatoid factor ([place]) 03/25/2014  . History of total knee replacement, left 03/25/2014  . DERMATITIS 09/28/2008  . CONSTIPATION 05/25/2008  . DYSPNEA ON EXERTION 05/25/2008  . ANEMIA, NORMOCYTIC 04/13/2008    . ANKLE PAIN, BILATERAL 11/25/2007  . HLD (hyperlipidemia) 08/14/2006  . Morbid obesity ([place]) 12/13/2005  . CARPAL TUNNEL SYNDROME 12/13/2005  . PERIPHERAL NEUROPATHY 12/13/2005  . Essential hypertension 12/13/2005  . DYSRHYTHMIA, CARDIAC NOS 12/13/2005    Past Medical History:  Diagnosis Date  . Anemia   . Arthritis    Rheumatoid, followed by Dr. Estanislado Pandy, knees & ankles   . CHF (congestive heart failure) ([place])    pt not aware of this  . Constipation   . GERD (gastroesophageal reflux disease)   . H/O cardiovascular stress test 02/2014   seen by Dr. Einar Gip - told that she was cleared for surgery  . Heart murmur    states she's never had any problems  . Hypertension   . Leg cramps   . Shortness of breath dyspnea    with exertion  . Sleep apnea    CPAP in use- QO night, study done at [facility]- 2007   . Stroke [facility]) 03/2015   TIA    Family History  Problem Relation Age of Onset  . Seizures Mother   . Transient ischemic attack Mother   . Hypertension Mother   . Diabetes Mother   . Congestive Heart Failure Mother   . Arthritis/Rheumatoid Mother   . Parkinson's disease Mother   . Dementia Mother   . Heart disease Father        CHF  . Stroke Sister   . Depression Sister   . Arthritis Brother   . Arthritis Brother   . Arthritis Son   . Hypertension Son    Past Surgical History:  Procedure Laterality Date  . ABDOMINAL HYSTERECTOMY    . CARPAL TUNNEL RELEASE Right   . COLONOSCOPY    . FOOT SURGERY Bilateral    bunionectomy, calluses , also has several pins & screws  . HAND SURGERY    . JOINT REPLACEMENT     planned for 03/25/2014  . KNEE SURGERY  1990's   . LUMBAR LAMINECTOMY/DECOMPRESSION MICRODISCECTOMY N/A 01/17/2015   Procedure: L2-3, L3-4 Decompression;  Surgeon: [name], MD;  Location: [place];  Service: Orthopedics;  Laterality: N/A;  . LUMBAR LAMINECTOMY/DECOMPRESSION MICRODISCECTOMY N/A 02/06/2015   Procedure: I AND D LUMBAR WITH WOUND VAC PLACEMENT;  Surgeon:  [name], MD;  Location: [place];  Service: Orthopedics;  Laterality: N/A;  . ORIF TOE FRACTURE Right 07/30/2016   Procedure: OPEN REDUCTION INTERNAL FIXATION (ORIF) RIGHT 2ND METATARSAL (TOE) FRACTURE;  Surgeon: [name], MD;  Location: [place];  Service: Orthopedics;  Laterality: Right;  . TOTAL KNEE ARTHROPLASTY Left 03/25/2014   Procedure: TOTAL KNEE ARTHROPLASTY;  Surgeon: Garald Balding, MD;  Location: [place];  Service: Orthopedics;  Laterality: Left;  . TUBAL LIGATION     Social History   Social History Narrative   Lives with husband.  2 Sons.  Using walker.    Caffeine  Less then 1 cups daily.   Retired.     Immunization History  Administered Date(s) Administered  . H1N1 12/23/2007  . Influenza Split 11/03/2014  . Influenza Whole 12/19/2005, 11/13/2006  . Influenza,inj,Quad PF,6+ Mos 09/16/2017  . Influenza-Unspecified 11/03/2014  . Pneumococcal Polysaccharide-23 12/19/2005  . Tdap 12/20/2011     Objective: Vital Signs:  BP (!) 149/94 (BP Location: Left Arm, Patient Position: Sitting, Cuff Size: Normal)   Pulse 72   Resp 15   Ht 4\' 11"  (1.499 m)   Wt 218 lb (98.9 kg)   BMI 44.03 kg/m    Physical Exam Vitals signs and nursing note reviewed.  Constitutional:      Appearance: She is well-developed.  HENT:     Head: Normocephalic and atraumatic.  Eyes:     Conjunctiva/sclera: Conjunctivae normal.  Neck:     Musculoskeletal: Normal range of motion.  Cardiovascular:     Rate and Rhythm: Normal rate and regular rhythm.     Heart sounds: Normal heart sounds.  Pulmonary:     Effort: Pulmonary effort is normal.     Breath sounds: Normal breath sounds.  Abdominal:     General: Bowel sounds are normal.     Palpations: Abdomen is soft.  Lymphadenopathy:     Cervical: No cervical adenopathy.  Skin:    General: Skin is warm and dry.     Capillary Refill: Capillary refill takes less than 2 seconds.  Neurological:     Mental Status: She is alert and oriented to  person, place, and time.  Psychiatric:        Behavior: Behavior normal.      Musculoskeletal Exam: C-spine limited ROM with lateral rotation.  Limited range of motion of thoracic and lumbar spine.  She has mild thoracic kyphosis.  No midline spinal tenderness.  No SI joint tenderness.  Shoulder joints limited for flexion and abduction.  Elbow joints good range of motion with no tenderness or warmth.  Limited range of motion of bilateral wrist joints.  She is synovial thickening or deviation of all MCPs.  She has tenderness of the right first MCP joint.  Left middle trigger finger.  Hip joints good range of motion with no discomfort.  No tenderness over trochanter bursa bilaterally.  Left knee replacement mild warmth no effusion.  Right knee good range of motion with no warmth or effusion.  She is synovial thickening bilateral ankle joints with some tenderness but no synovitis noted.  She has osteoarthritic changes in bilateral feet with overcrowding of toes but no tenderness.  CDAI Exam: CDAI Score: 0.4  Patient Global Assessment: 2 (mm); Provider Global Assessment: 2 (mm) Swollen: 0 ; Tender: 2  Joint Exam      Right  Left  Ankle   Tender   Tender   There is currently no information documented on the homunculus. Go to the Rheumatology activity and complete the homunculus joint exam.  Investigation: No additional findings.  Imaging: No results found.  Recent Labs: Lab Results  Component Value Date   WBC 7.9 11/28/2017   HGB 11.3 (L) 11/28/2017   PLT 276 11/28/2017   NA 142 11/28/2017   K 4.0 11/28/2017   CL 106 11/28/2017   CO2 26 11/28/2017   GLUCOSE 86 11/28/2017   BUN 21 11/28/2017   CREATININE 1.31 (H) 11/28/2017   BILITOT 0.3 11/28/2017   ALKPHOS 115 07/30/2016   AST 17 11/28/2017   ALT 9 11/28/2017   PROT 7.3 11/28/2017   ALBUMIN 3.5 07/30/2016   CALCIUM 9.4 11/28/2017   GFRAA 47 (L) 11/28/2017   QFTBGOLDPLUS NEGATIVE 11/28/2017    Speciality Comments: Narcotic  Agreement 10/30/16  Procedures:  No procedures performed Allergies: Ramipril   Assessment / Plan:     Visit Diagnoses: Rheumatoid arthritis of multiple sites with negative rheumatoid factor ([place]): She has no  active synovitis.  She has synovial thickening and ulnar deviation of all MCP joints.  She has tenderness of the right 1st MCP joint.  She has tenderness of bilateral ankle joints but no warmth or effusion noted.  She is clinically doing well on Humira 40 mg sq injections every 14 days.  She has not missed any doses recently.  Her most recent dose was on 01/28/18.  She continues to receive yearly skin exams.  She has not had any recent infections recently.  She will continue on this current treatment regimen.  She does not need any refills at this time.  We will obtain X-rays of both hands and feet today to assess for any radiographic progression.   She has severe erosive disease. She was advised to notify us if she develops increased joint pain or joint swelling she will follow up in 5 months.   High risk medication use - Humira 40 mg every 14 days.  Last TB gold negative on 11/28/17.  Most recent CBC/CMP stable on 11/28/17 and will monitor every 3 months. Standing orders in place.  Received flu vaccine in September 2019 and previously Pneumovax 23.  She has not had any recent infections.  She continues to receive yearly skin exams while on Humira.  Medication monitoring encounter - Tramadol 50 mg 1-2 tablets po PRN.  UDS and narcotic agreement were updated today 01/31/18.  - Plan: Pain Mgmt, Profile 5 w/Conf, U, Pain Mgmt, Tramadol w/medMATCH, U  Trigger finger, left middle finger: She has tenderness and locking of the left middle finger.  We will schedule an ultrasound guided cortisone injection.    History of total knee replacement, left: Mild warmth but no effusion.  She has good range of motion with no discomfort.  She continues to walk with a cane.  She has some difficulty going up steps but  has no pain at this time.  Unilateral primary osteoarthritis, right knee: No warmth or effusion.  Good range of motion.  She has no discomfort at this time.  Primary osteoarthritis of both feet: She is osteoarthritic changes and overcrowding in her feet.  She wears proper fitting shoes.  She is no discomfort in her feet at this time.  She has intermittent bilateral ankle pain.  She has tenderness of bilateral ankles on exam today  DDD (degenerative disc disease), lumbar: Chronic pain.  She has no midline spinal tenderness at this time.  Other medical conditions are listed as follows:  History of hypertension  History of sleep apnea  History of TIA (transient ischemic attack)  History of hyperlipidemia   Orders: Orders Placed This Encounter  Procedures  . Pain Mgmt, Profile 5 w/Conf, U  . Pain Mgmt, Tramadol w/medMATCH, U   No orders of the defined types were placed in this encounter.    Follow-Up Instructions: Return in about 5 months (around 07/02/2018) for Rheumatoid arthritis, Osteoarthritis, DDD.   Ofilia Neas, PA-C   I examined and evaluated the patient with Hazel Sams PA.  Patient has severe rheumatoid arthritis.  On my examination she does not have much synovitis although she had a lot of synovial thickening and joint deformities.  X-ray obtained today showed severe erosive rheumatoid arthritis.  The plan of care was discussed as noted above.  Bo Merino, MD  Note - This record has been created using Editor, commissioning.  Chart creation errors have been sought, but may not always  have been located. Such creation errors do not reflect on  the standard of medical care. 

## 2018-01-31 ENCOUNTER — Ambulatory Visit (INDEPENDENT_AMBULATORY_CARE_PROVIDER_SITE_OTHER): Payer: Medicare Other

## 2018-01-31 ENCOUNTER — Ambulatory Visit: Payer: Medicare Other | Admitting: Physician Assistant

## 2018-01-31 ENCOUNTER — Ambulatory Visit (INDEPENDENT_AMBULATORY_CARE_PROVIDER_SITE_OTHER): Payer: Self-pay

## 2018-01-31 ENCOUNTER — Encounter: Payer: Self-pay | Admitting: Physician Assistant

## 2018-01-31 VITALS — BP 149/94 | HR 72 | Resp 15 | Ht 59.0 in | Wt 218.0 lb

## 2018-01-31 DIAGNOSIS — M0609 Rheumatoid arthritis without rheumatoid factor, multiple sites: Secondary | ICD-10-CM | POA: Diagnosis not present

## 2018-01-31 DIAGNOSIS — Z8639 Personal history of other endocrine, nutritional and metabolic disease: Secondary | ICD-10-CM

## 2018-01-31 DIAGNOSIS — Z8673 Personal history of transient ischemic attack (TIA), and cerebral infarction without residual deficits: Secondary | ICD-10-CM

## 2018-01-31 DIAGNOSIS — Z5181 Encounter for therapeutic drug level monitoring: Secondary | ICD-10-CM

## 2018-01-31 DIAGNOSIS — Z8679 Personal history of other diseases of the circulatory system: Secondary | ICD-10-CM

## 2018-01-31 DIAGNOSIS — M1711 Unilateral primary osteoarthritis, right knee: Secondary | ICD-10-CM

## 2018-01-31 DIAGNOSIS — Z79899 Other long term (current) drug therapy: Secondary | ICD-10-CM | POA: Diagnosis not present

## 2018-01-31 DIAGNOSIS — M5136 Other intervertebral disc degeneration, lumbar region: Secondary | ICD-10-CM

## 2018-01-31 DIAGNOSIS — M19072 Primary osteoarthritis, left ankle and foot: Secondary | ICD-10-CM

## 2018-01-31 DIAGNOSIS — M19071 Primary osteoarthritis, right ankle and foot: Secondary | ICD-10-CM

## 2018-01-31 DIAGNOSIS — M65332 Trigger finger, left middle finger: Secondary | ICD-10-CM

## 2018-01-31 DIAGNOSIS — Z96652 Presence of left artificial knee joint: Secondary | ICD-10-CM

## 2018-01-31 DIAGNOSIS — Z8669 Personal history of other diseases of the nervous system and sense organs: Secondary | ICD-10-CM

## 2018-01-31 NOTE — Patient Instructions (Signed)
Standing Labs We placed an order today for your standing lab work.    Please come back and get your standing labs in February and every 3 months   We have open lab Monday through Friday from 8:30-11:30 AM and 1:30-4:00 PM  at the office of Dr. Shaili Deveshwar.   You may experience shorter wait times on Monday and Friday afternoons. The office is located at [address], [address], [place], [place] [address] No appointment is necessary.   Labs are drawn by [facility].  You may receive a bill from [facility] for your lab work.  If you wish to have your labs drawn at another location, please call the office 24 hours in advance to send orders.  If you have any questions regarding directions or hours of operation,  please call [phone].   Just as a reminder please drink plenty of water prior to coming for your lab work. Thanks!   

## 2018-02-02 LAB — PAIN MGMT, TRAMADOL W/MEDMATCH, U
Desmethyltramadol: 347 ng/mL — ABNORMAL HIGH (ref <100)
Tramadol: 410 ng/mL — ABNORMAL HIGH (ref <100)

## 2018-02-02 LAB — PAIN MGMT, PROFILE 5 W/CONF, U
Amphetamines: NEGATIVE ng/mL (ref <500)
Barbiturates: NEGATIVE ng/mL (ref <300)
Benzodiazepines: NEGATIVE ng/mL (ref <100)
Cocaine Metabolite: NEGATIVE ng/mL (ref <150)
Creatinine: 149.1 mg/dL
Marijuana Metabolite: NEGATIVE ng/mL (ref <20)
Methadone Metabolite: NEGATIVE ng/mL (ref <100)
Opiates: NEGATIVE ng/mL (ref <100)
Oxidant: NEGATIVE ug/mL (ref <200)
Oxycodone: NEGATIVE ng/mL (ref <100)
pH: 6.17 (ref 4.5–9.0)

## 2018-02-03 NOTE — Progress Notes (Signed)
UDS is consistent with treatment.

## 2018-02-04 ENCOUNTER — Other Ambulatory Visit: Payer: Self-pay | Admitting: Rheumatology

## 2018-02-04 MED FILL — HUMIRA PEN 40 MG/0.4ML PNKT: 40 | 28 days supply | Qty: 2 | Fill #1

## 2018-02-05 NOTE — Telephone Encounter (Signed)
ok 

## 2018-02-05 NOTE — Telephone Encounter (Signed)
Last Visit: 01/31/18 Next Visit: 07/04/18 UDS: 01/31/18 Narc Agreement: 01/31/18  Okay to refill Tramadol?

## 2018-03-04 ENCOUNTER — Other Ambulatory Visit: Payer: Self-pay | Admitting: Rheumatology

## 2018-03-04 ENCOUNTER — Telehealth: Payer: Self-pay | Admitting: Rheumatology

## 2018-03-04 DIAGNOSIS — Z79899 Other long term (current) drug therapy: Secondary | ICD-10-CM

## 2018-03-04 NOTE — Telephone Encounter (Signed)
ok 

## 2018-03-04 NOTE — Telephone Encounter (Signed)
Patient called requesting labwork orders be sent to [facility] in [place].  Patient states she will be going tomorrow 03/05/18.

## 2018-03-04 NOTE — Telephone Encounter (Signed)
Lab Orders released.  

## 2018-03-04 NOTE — Telephone Encounter (Signed)
Last Visit: 01/31/18 Next Visit: 07/04/18 UDS: 01/31/18 Narc Agreement: 01/31/18  Okay to refill Tramadol?

## 2018-03-05 ENCOUNTER — Other Ambulatory Visit: Payer: Self-pay | Admitting: *Deleted

## 2018-03-05 DIAGNOSIS — Z79899 Other long term (current) drug therapy: Secondary | ICD-10-CM

## 2018-03-05 NOTE — Addendum Note (Signed)
Addended by: Carole Binning on: 03/05/2018 08:43 AM   Modules accepted: Orders

## 2018-03-06 ENCOUNTER — Telehealth: Payer: Self-pay | Admitting: Rheumatology

## 2018-03-06 LAB — CMP14+EGFR
ALT: 10 IU/L (ref 0–32)
AST: 16 IU/L (ref 0–40)
Albumin/Globulin Ratio: 1.2 (ref 1.2–2.2)
Albumin: 4.1 g/dL (ref 3.7–4.7)
Alkaline Phosphatase: 110 IU/L (ref 39–117)
BUN/Creatinine Ratio: 22 (ref 12–28)
BUN: 22 mg/dL (ref 8–27)
Bilirubin Total: 0.3 mg/dL (ref 0.0–1.2)
CO2: 24 mmol/L (ref 20–29)
Calcium: 9.6 mg/dL (ref 8.7–10.3)
Chloride: 107 mmol/L — ABNORMAL HIGH (ref 96–106)
Creatinine, Ser: 1 mg/dL (ref 0.57–1.00)
GFR calc Af Amer: 65 mL/min/{1.73_m2} (ref >59)
GFR calc non Af Amer: 56 mL/min/{1.73_m2} — ABNORMAL LOW (ref >59)
Globulin, Total: 3.3 g/dL (ref 1.5–4.5)
Glucose: 80 mg/dL (ref 65–99)
Potassium: 4.5 mmol/L (ref 3.5–5.2)
Sodium: 147 mmol/L — ABNORMAL HIGH (ref 134–144)
Total Protein: 7.4 g/dL (ref 6.0–8.5)

## 2018-03-06 LAB — CBC WITH DIFFERENTIAL/PLATELET
Basophils Absolute: 0.1 10*3/uL (ref 0.0–0.2)
Basos: 1 %
EOS (ABSOLUTE): 0.2 10*3/uL (ref 0.0–0.4)
Eos: 3 %
Hematocrit: 36.8 % (ref 34.0–46.6)
Hemoglobin: 11.7 g/dL (ref 11.1–15.9)
Immature Grans (Abs): 0 10*3/uL (ref 0.0–0.1)
Immature Granulocytes: 0 %
Lymphocytes Absolute: 2.3 10*3/uL (ref 0.7–3.1)
Lymphs: 32 %
MCH: 28.4 pg (ref 26.6–33.0)
MCHC: 31.8 g/dL (ref 31.5–35.7)
MCV: 89 fL (ref 79–97)
Monocytes Absolute: 0.5 10*3/uL (ref 0.1–0.9)
Monocytes: 7 %
Neutrophils Absolute: 4.1 10*3/uL (ref 1.4–7.0)
Neutrophils: 57 %
Platelets: 260 10*3/uL (ref 150–450)
RBC: 4.12 x10E6/uL (ref 3.77–5.28)
RDW: 12.2 % (ref 11.7–15.4)
WBC: 7.2 10*3/uL (ref 3.4–10.8)

## 2018-03-06 MED FILL — HUMIRA PEN 40 MG/0.4ML PNKT: 40 | 28 days supply | Qty: 2 | Fill #2

## 2018-03-06 NOTE — Telephone Encounter (Signed)
Stable, Na is high. Pl fax to her PCP.

## 2018-03-06 NOTE — Telephone Encounter (Signed)
Patient called stating she was returning your call.   °

## 2018-03-07 NOTE — Telephone Encounter (Signed)
Patient advised labs are Stable, Na is high. Copy faxed to her PCP.

## 2018-03-25 ENCOUNTER — Other Ambulatory Visit: Payer: Self-pay | Admitting: Rheumatology

## 2018-03-25 NOTE — Telephone Encounter (Signed)
Last Visit:01/31/18 Next Visit:07/04/18 Labs: 03/05/18 Stable, Na is high. TB gold: 11/29/18 Neg   Okay to refill per Dr. Estanislado Pandy

## 2018-03-31 ENCOUNTER — Other Ambulatory Visit: Payer: Self-pay | Admitting: Rheumatology

## 2018-03-31 NOTE — Telephone Encounter (Signed)
Last Visit: 01/31/2018 Next Visit: 07/04/2018 UDS: 01/31/2018 c/w Narc Agreement: 01/31/2018  Okay to refill tramadol?

## 2018-03-31 NOTE — Telephone Encounter (Signed)
ok 

## 2018-04-02 MED FILL — HUMIRA PEN 40 MG/0.4ML PNKT: 40 | 28 days supply | Qty: 2 | Fill #0

## 2018-04-30 MED FILL — HUMIRA PEN 40 MG/0.4ML PNKT: 40 | 28 days supply | Qty: 2 | Fill #1

## 2018-05-06 ENCOUNTER — Other Ambulatory Visit: Payer: Self-pay | Admitting: Rheumatology

## 2018-05-06 NOTE — Telephone Encounter (Signed)
Last Visit: 01/31/2018 Next Visit: 07/04/2018 UDS:01/31/2018 c/w Narc Agreement: 01/31/2018  Last fill: 03/31/2018  Okay to refill tramadol?

## 2018-05-06 NOTE — Telephone Encounter (Signed)
ok 

## 2018-06-03 MED FILL — HUMIRA PEN 40 MG/0.4ML PNKT: 40 | 28 days supply | Qty: 2 | Fill #2

## 2018-06-04 ENCOUNTER — Other Ambulatory Visit: Payer: Self-pay | Admitting: Rheumatology

## 2018-06-04 NOTE — Telephone Encounter (Signed)
Last Visit:01/31/18 Next Visit:07/04/18 UDS: 01/31/2018 c/w Narc Agreement: 01/31/2018  Okay to refill tramadol?

## 2018-06-05 NOTE — Telephone Encounter (Signed)
ok 

## 2018-06-07 ENCOUNTER — Other Ambulatory Visit: Payer: Self-pay | Admitting: Rheumatology

## 2018-06-09 NOTE — Telephone Encounter (Signed)
Prescription was faxed on 06/05/18. Spoke with pharmacist and he states he did not receive the fax. Gave verbal to refill prescription.

## 2018-06-20 NOTE — Progress Notes (Signed)
Office Visit Note  Patient: [name]             Date of Birth: [birth year]           MRN: [id number]             PCP: Ann Held, MD Referring: Ann Held, MD Visit Date: 07/04/2018 Occupation: @GUAROCC @  Subjective:  Left middle trigger finger    History of Present Illness: [name] is a 73 y.o. female with history of seronegative rheumatoid arthritis and osteoarthritis.  She is on Humira 40 mg sq injections every 14 days. She denies missing any doses.  She is due for her next injection tomorrow.  She denies any recent flares.  She denies any joint swelling.  She has intermittent right wrist pain. She has a left middle trigger finger.  She reports she fell on Monday and landed on both knee joints on tile floor.  She reports both knees are bruised and her chin is mildly swollen.  She states she has generalized soreness from the fall.  She states that her knee joints have slowly started to improve.  She reports has good range of motion of both knees.  Her left knee joint is replaced.  She states she is been having some lower extremity pain on the anterior surface of the left shin.  She has edema bilaterally.  She denies any calf tightness or tenderness.  She does not remember injuring this region.  She states that is been going on for about 1 week.  She is applied Aspercreme topically.   Activities of Daily Living:  Patient reports morning stiffness for 15  minutes.   Patient Denies nocturnal pain.  Difficulty dressing/grooming: Denies Difficulty climbing stairs: Reports Difficulty getting out of chair: Reports Difficulty using hands for taps, buttons, cutlery, and/or writing: Denies  Review of Systems  Constitutional: Negative for fatigue.  HENT: Positive for mouth dryness. Negative for mouth sores and nose dryness.   Eyes: Negative for pain, visual disturbance and dryness.  Respiratory: Negative for cough, hemoptysis, shortness of breath and difficulty  breathing.   Cardiovascular: Negative for chest pain, palpitations, hypertension and swelling in legs/feet.  Gastrointestinal: Negative for blood in stool, constipation and diarrhea.  Endocrine: Negative for increased urination.  Genitourinary: Negative for painful urination.  Musculoskeletal: Positive for arthralgias, joint pain and morning stiffness. Negative for joint swelling, myalgias, muscle weakness, muscle tenderness and myalgias.  Skin: Negative for color change, pallor, rash, hair loss, nodules/bumps, skin tightness, ulcers and sensitivity to sunlight.  Allergic/Immunologic: Negative for susceptible to infections.  Neurological: Negative for dizziness, numbness, headaches and weakness.  Hematological: Negative for swollen glands.  Psychiatric/Behavioral: Negative for depressed mood and sleep disturbance. The patient is not nervous/anxious.     PMFS History:  Patient Active Problem List   Diagnosis Date Noted  . Closed fracture of shaft of metatarsal bone 07/30/2016  . Metatarsal fracture 07/30/2016  . Displaced fracture of second metatarsal bone, right foot, initial encounter for closed fracture 07/26/2016  . Arthritis of shoulder 04/10/2016  . High risk medication use 03/16/2016  . Primary osteoarthritis of both feet 03/16/2016  . Primary osteoarthritis of both knees 03/16/2016  . DDD (degenerative disc disease), lumbar 03/16/2016  . OSA on CPAP 07/27/2015  . Lip numbness 03/19/2015  . TIA (transient ischemic attack) 03/19/2015  . 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[place]) 02/06/2015  . Post op infection 02/05/2015  . History of lumbar laminectomy for spinal cord decompression 01/17/2015  .  Lumbar spinal stenosis 01/17/2015  . Primary osteoarthritis of left knee 03/25/2014  . Rheumatoid arthritis of multiple sites with negative rheumatoid factor ([place]) 03/25/2014  . History of total knee replacement, left 03/25/2014  . DERMATITIS 09/28/2008  . CONSTIPATION 05/25/2008  . DYSPNEA ON  EXERTION 05/25/2008  . ANEMIA, NORMOCYTIC 04/13/2008  . ANKLE PAIN, BILATERAL 11/25/2007  . HLD (hyperlipidemia) 08/14/2006  . Morbid obesity ([place]) 12/13/2005  . CARPAL TUNNEL SYNDROME 12/13/2005  . PERIPHERAL NEUROPATHY 12/13/2005  . Essential hypertension 12/13/2005  . DYSRHYTHMIA, CARDIAC NOS 12/13/2005    Past Medical History:  Diagnosis Date  . Anemia   . Arthritis    Rheumatoid, followed by Dr. Estanislado Pandy, knees & ankles   . CHF (congestive heart failure) ([place])    pt not aware of this  . Constipation   . GERD (gastroesophageal reflux disease)   . H/O cardiovascular stress test 02/2014   seen by Dr. Einar Gip - told that she was cleared for surgery  . Heart murmur    states she's never had any problems  . Hypertension   . Leg cramps   . Shortness of breath dyspnea    with exertion  . Sleep apnea    CPAP in use- QO night, study done at [facility]- 2007   . Stroke [facility]) 03/2015   TIA    Family History  Problem Relation Age of Onset  . Seizures Mother   . Transient ischemic attack Mother   . Hypertension Mother   . Diabetes Mother   . Congestive Heart Failure Mother   . Arthritis/Rheumatoid Mother   . Parkinson's disease Mother   . Dementia Mother   . Heart disease Father        CHF  . Stroke Sister   . Depression Sister   . Arthritis Brother   . Arthritis Brother   . Arthritis Son   . Hypertension Son    Past Surgical History:  Procedure Laterality Date  . ABDOMINAL HYSTERECTOMY    . CARPAL TUNNEL RELEASE Right   . COLONOSCOPY    . FOOT SURGERY Bilateral    bunionectomy, calluses , also has several pins & screws  . HAND SURGERY    . JOINT REPLACEMENT     planned for 03/25/2014  . KNEE SURGERY  1990's   . LUMBAR LAMINECTOMY/DECOMPRESSION MICRODISCECTOMY N/A 01/17/2015   Procedure: L2-3, L3-4 Decompression;  Surgeon: [name], MD;  Location: [place];  Service: Orthopedics;  Laterality: N/A;  . LUMBAR LAMINECTOMY/DECOMPRESSION MICRODISCECTOMY N/A 02/06/2015    Procedure: I AND D LUMBAR WITH WOUND VAC PLACEMENT;  Surgeon: [name], MD;  Location: [place];  Service: Orthopedics;  Laterality: N/A;  . ORIF TOE FRACTURE Right 07/30/2016   Procedure: OPEN REDUCTION INTERNAL FIXATION (ORIF) RIGHT 2ND METATARSAL (TOE) FRACTURE;  Surgeon: [name], MD;  Location: [place];  Service: Orthopedics;  Laterality: Right;  . TOTAL KNEE ARTHROPLASTY Left 03/25/2014   Procedure: TOTAL KNEE ARTHROPLASTY;  Surgeon: Garald Balding, MD;  Location: [place];  Service: Orthopedics;  Laterality: Left;  . TUBAL LIGATION     Social History   Social History Narrative   Lives with husband.  2 Sons.  Using walker.    Caffeine  Less then 1 cups daily.   Retired.     Immunization History  Administered Date(s) Administered  . H1N1 12/23/2007  . Influenza Split 11/03/2014  . Influenza Whole 12/19/2005, 11/13/2006  . Influenza,inj,Quad PF,6+ Mos 09/16/2017  . Influenza-Unspecified 11/03/2014  . Pneumococcal  Polysaccharide-23 12/19/2005  . Tdap 12/20/2011     Objective: Vital Signs: BP (!) 157/83 (BP Location: Left Wrist, Patient Position: Sitting, Cuff Size: Normal)   Pulse 68   Resp 14   Ht 4\' 11"  (1.499 m)   Wt 223 lb (101.2 kg)   BMI 45.04 kg/m    Physical Exam Vitals signs and nursing note reviewed.  Constitutional:      Appearance: She is well-developed.  HENT:     Head: Normocephalic and atraumatic.  Eyes:     Conjunctiva/sclera: Conjunctivae normal.  Neck:     Musculoskeletal: Normal range of motion.  Cardiovascular:     Rate and Rhythm: Normal rate and regular rhythm.     Heart sounds: Normal heart sounds.  Pulmonary:     Effort: Pulmonary effort is normal.     Breath sounds: Normal breath sounds.  Abdominal:     General: Bowel sounds are normal.     Palpations: Abdomen is soft.  Lymphadenopathy:     Cervical: No cervical adenopathy.  Skin:    General: Skin is warm and dry.     Capillary Refill: Capillary refill takes less than 2 seconds.   Neurological:     Mental Status: She is alert and oriented to person, place, and time.  Psychiatric:        Behavior: Behavior normal.      Musculoskeletal Exam: C-spine good range of motion with some discomfort.  Thoracic kyphosis noted.  Limited range of motion lumbar spine.  No midline spinal tenderness.  No SI joint tenderness.  Bilateral shoulder joints have limited abduction.  Good internal rotation of both shoulder joints.  Possible nontender rheumatoid nodule on the palmar aspect of the right hand.  Left middle trigger finger noted.  Tenderness over the dorsal aspect of the right wrist joint.  Limited range of motion of bilateral wrist joints.  She has synovial thickening of all MCP joints.  Ulnar deviation noted.  Hip joints have good range of motion with no discomfort.  Left knee replacement is warm.  She has ecchymosis overlying the anterior surface of bilateral knee joints.  Bilateral knees have good range of motion with no discomfort at this time.  She has synovial thickening of bilateral ankle joints.  She has good range of motion of both ankles.  No calf tenderness or tightness noted.  She does have pitting edema bilaterally.  She has tenderness and ecchymosis on the distal aspect of the left shin.  CDAI Exam: CDAI Score: 1.6  Patient Global: 3 mm; Provider Global: 3 mm Swollen: 0 ; Tender: 1  Joint Exam      Right  Left  Wrist   Tender        Investigation: No additional findings.  Imaging: No results found.  Recent Labs: Lab Results  Component Value Date   WBC 7.2 03/05/2018   HGB 11.7 03/05/2018   PLT 260 03/05/2018   NA 147 (H) 03/05/2018   K 4.5 03/05/2018   CL 107 (H) 03/05/2018   CO2 24 03/05/2018   GLUCOSE 80 03/05/2018   BUN 22 03/05/2018   CREATININE 1.00 03/05/2018   BILITOT 0.3 03/05/2018   ALKPHOS 110 03/05/2018   AST 16 03/05/2018   ALT 10 03/05/2018   PROT 7.4 03/05/2018   ALBUMIN 4.1 03/05/2018   CALCIUM 9.6 03/05/2018   GFRAA 65  03/05/2018   QFTBGOLDPLUS NEGATIVE 11/28/2017    Speciality Comments: Narcotic Agreement 10/30/16  Procedures:  No procedures performed Allergies: Ramipril  Assessment / Plan:     Visit Diagnoses: Rheumatoid arthritis of multiple sites with negative rheumatoid factor ([place]) - She has no synovitis on exam.  She has not had any recent rheumatoid arthritis flares.  She is clinically doing well on Humira 40 mg subcutaneous injections every 14 days.  She has not missed any doses of Humira recently.  She has not had any recent fevers.  She tripped and fell and landed both knee joints on tile floor 4 days ago.  She has ecchymosis on the anterior surface of bilateral knee joints.  She has good range of motion of bilateral knees.  She has mild warmth in the left knee which is replaced.  No effusion noted.  She has some tenderness and ecchymosis on the anterior surface of the distal tibia.  She has no calf tenderness or tightness.  She has good range of motion of bilateral ankle joints.  She declined x-rays today.  She has been taking tramadol as prescribed for pain relief.  Refill tramadol sent to the pharmacy today.  She will continue on Humira 40 mg subcutaneous injections every 14 days.  She does not need any refills at this time.  She was advised to notify us if she develops any increased joint pain or joint swelling.  She will follow-up in the office in 5 months.  High risk medication use - Humira 40 mg every 14 days.  Last TB gold negative on 11/28/2017 and will monitor yearly.  Most recent CBC/CMP stable on 03/05/2018.  Due for CBC/CMP today and will monitor every 3 months.  Standing orders are in place.  She received a flu vaccine in September and previously Pneumovax 23.   I recommend yearly skin exams due to increased risk of melanoma while on Humira.  - Plan: CBC with Differential/Platelet, COMPLETE METABOLIC PANEL WITH GFR  Trigger finger, left middle finger - She continues to have tenderness and  intermittent locking.  We discussed using a splint or buddy tape to the adjacent finger.  She can apply Aspercreme topically to help with inflammation and tenderness.  She is advised to notify us if her symptoms worsen or persist and she can return for an ultrasound-guided cortisone injection.  History of total knee replacement, left -She fell on Monday and landed on both knee joints.  She has ecchymosis overlying the anterior surface of the left knee.  She has good range of motion of the left knee replacement.  She has mild warmth but no effusion.  She declined x-rays today.  She was advised to notify us if develops increased joint pain or joint swelling.  Pain in left shin -she has tenderness and ecchymosis at the distal end of the left tibia.  She does not recall any traumatic events.  She did have a fall 4 days ago due to discomfort and motion started out 1 week ago.  She has no calf tenderness or tightness.  She has good range of motion of the left ankle joint and left knee joint.  She does have pitting edema bilaterally.  She was encouraged to ice elevate and use Aspercreme topically.  She declined x-ray today.  She was advised to notify us if she develops any new or worsening symptoms.  Unilateral primary osteoarthritis, right knee -She has good range of motion with no warmth or effusion.  She fell and landed on both knee joints on Monday.  She has ecchymosis overlying the right knee.  She was encouraged to ice and elevate  her knee joints.  She can continue to apply Aspercreme topically.  Primary osteoarthritis of both feet -She has no feet pain or tenderness.    DDD (degenerative disc disease), lumbar - She has intermittent lower back pain.   Medication monitoring encounter - UDS and narcotic agreement were updated today on 07/04/2018.  A refill of tramadol was sent to the pharmacy.  Plan: Pain Mgmt, Profile 5 w/Conf, U, Pain Mgmt, Tramadol w/medMATCH, U  Other medical conditions are listed as  follows:  History of hypertension   History of sleep apnea   History of TIA (transient ischemic attack)   History of hyperlipidemia  Orders: Orders Placed This Encounter  Procedures  . CBC with Differential/Platelet  . COMPLETE METABOLIC PANEL WITH GFR  . Pain Mgmt, Profile 5 w/Conf, U  . Pain Mgmt, Tramadol w/medMATCH, U   Meds ordered this encounter  Medications  . traMADol (ULTRAM) 50 MG tablet    Sig: Take 1 to 2 tablets by mouth as needed for pain relief.    Dispense:  60 tablet    Refill:  0    Face-to-face time spent with patient was 30 minutes. Greater than 50% of time was spent in counseling and coordination of care.  Follow-Up Instructions: Return in about 5 months (around 12/04/2018) for Rheumatoid arthritis, Osteoarthritis.   Ofilia Neas, PA-C  Note - This record has been created using Dragon software.  Chart creation errors have been sought, but may not always  have been located. Such creation errors do not reflect on  the standard of medical care.

## 2018-06-23 ENCOUNTER — Other Ambulatory Visit: Payer: Self-pay | Admitting: Rheumatology

## 2018-06-23 NOTE — Telephone Encounter (Signed)
Last Visit: 01/31/2018 Next Visit: 07/04/2018 Labs: 03/05/2018 Stable, Na is high TB Gold: 11/28/2017 neg   Will update labs at 07/04/2018 appointment.   Okay to refill per Dr. Estanislado Pandy.

## 2018-06-30 MED FILL — HUMIRA PEN 40 MG/0.4ML PNKT: 40 | 28 days supply | Qty: 2 | Fill #0

## 2018-07-04 ENCOUNTER — Encounter: Payer: Self-pay | Admitting: Physician Assistant

## 2018-07-04 ENCOUNTER — Ambulatory Visit (INDEPENDENT_AMBULATORY_CARE_PROVIDER_SITE_OTHER): Payer: Medicare Other | Admitting: Physician Assistant

## 2018-07-04 ENCOUNTER — Other Ambulatory Visit: Payer: Self-pay

## 2018-07-04 VITALS — BP 157/83 | HR 68 | Resp 14 | Ht 59.0 in | Wt 223.0 lb

## 2018-07-04 DIAGNOSIS — Z5181 Encounter for therapeutic drug level monitoring: Secondary | ICD-10-CM

## 2018-07-04 DIAGNOSIS — Z79899 Other long term (current) drug therapy: Secondary | ICD-10-CM

## 2018-07-04 DIAGNOSIS — M65332 Trigger finger, left middle finger: Secondary | ICD-10-CM

## 2018-07-04 DIAGNOSIS — M79662 Pain in left lower leg: Secondary | ICD-10-CM

## 2018-07-04 DIAGNOSIS — Z8639 Personal history of other endocrine, nutritional and metabolic disease: Secondary | ICD-10-CM

## 2018-07-04 DIAGNOSIS — M0609 Rheumatoid arthritis without rheumatoid factor, multiple sites: Secondary | ICD-10-CM

## 2018-07-04 DIAGNOSIS — Z96652 Presence of left artificial knee joint: Secondary | ICD-10-CM

## 2018-07-04 DIAGNOSIS — Z8669 Personal history of other diseases of the nervous system and sense organs: Secondary | ICD-10-CM

## 2018-07-04 DIAGNOSIS — M5136 Other intervertebral disc degeneration, lumbar region: Secondary | ICD-10-CM

## 2018-07-04 DIAGNOSIS — M19071 Primary osteoarthritis, right ankle and foot: Secondary | ICD-10-CM

## 2018-07-04 DIAGNOSIS — M19072 Primary osteoarthritis, left ankle and foot: Secondary | ICD-10-CM

## 2018-07-04 DIAGNOSIS — M1711 Unilateral primary osteoarthritis, right knee: Secondary | ICD-10-CM

## 2018-07-04 DIAGNOSIS — Z8679 Personal history of other diseases of the circulatory system: Secondary | ICD-10-CM

## 2018-07-04 DIAGNOSIS — Z8673 Personal history of transient ischemic attack (TIA), and cerebral infarction without residual deficits: Secondary | ICD-10-CM

## 2018-07-04 MED ORDER — TRAMADOL HCL 50 MG PO TABS: ORAL_TABLET | ORAL | 0 refills | Status: DC

## 2018-07-06 LAB — CBC WITH DIFFERENTIAL/PLATELET
Absolute Monocytes: 395 cells/uL (ref 200–950)
Basophils Absolute: 67 cells/uL (ref 0–200)
Basophils Relative: 1 %
Eosinophils Absolute: 161 cells/uL (ref 15–500)
Eosinophils Relative: 2.4 %
HCT: 36.1 % (ref 35.0–45.0)
Hemoglobin: 11.4 g/dL — ABNORMAL LOW (ref 11.7–15.5)
Lymphs Abs: 1943 cells/uL (ref 850–3900)
MCH: 28.6 pg (ref 27.0–33.0)
MCHC: 31.6 g/dL — ABNORMAL LOW (ref 32.0–36.0)
MCV: 90.5 fL (ref 80.0–100.0)
MPV: 10.7 fL (ref 7.5–12.5)
Monocytes Relative: 5.9 %
Neutro Abs: 4134 cells/uL (ref 1500–7800)
Neutrophils Relative %: 61.7 %
Platelets: 256 10*3/uL (ref 140–400)
RBC: 3.99 10*6/uL (ref 3.80–5.10)
RDW: 12.7 % (ref 11.0–15.0)
Total Lymphocyte: 29 %
WBC: 6.7 10*3/uL (ref 3.8–10.8)

## 2018-07-06 LAB — PAIN MGMT, PROFILE 5 W/CONF, U
Amphetamines: NEGATIVE ng/mL
Barbiturates: NEGATIVE ng/mL
Benzodiazepines: NEGATIVE ng/mL
Cocaine Metabolite: NEGATIVE ng/mL
Creatinine: 71 mg/dL
Marijuana Metabolite: NEGATIVE ng/mL
Methadone Metabolite: NEGATIVE ng/mL
Opiates: NEGATIVE ng/mL
Oxidant: NEGATIVE ug/mL
Oxycodone: NEGATIVE ng/mL
pH: 7.2 (ref 4.5–9.0)

## 2018-07-06 LAB — COMPLETE METABOLIC PANEL WITH GFR
AG Ratio: 1.2 (calc) (ref 1.0–2.5)
ALT: 10 U/L (ref 6–29)
AST: 15 U/L (ref 10–35)
Albumin: 4 g/dL (ref 3.6–5.1)
Alkaline phosphatase (APISO): 107 U/L (ref 37–153)
BUN/Creatinine Ratio: 16 (calc) (ref 6–22)
BUN: 17 mg/dL (ref 7–25)
CO2: 28 mmol/L (ref 20–32)
Calcium: 9.7 mg/dL (ref 8.6–10.4)
Chloride: 104 mmol/L (ref 98–110)
Creat: 1.04 mg/dL — ABNORMAL HIGH (ref 0.60–0.93)
GFR, Est African American: 62 mL/min/{1.73_m2} (ref >=60)
GFR, Est Non African American: 54 mL/min/{1.73_m2} — ABNORMAL LOW (ref >=60)
Globulin: 3.3 g/dL (calc) (ref 1.9–3.7)
Glucose, Bld: 79 mg/dL (ref 65–99)
Potassium: 3.9 mmol/L (ref 3.5–5.3)
Sodium: 141 mmol/L (ref 135–146)
Total Bilirubin: 0.4 mg/dL (ref 0.2–1.2)
Total Protein: 7.3 g/dL (ref 6.1–8.1)

## 2018-07-06 LAB — PAIN MGMT, TRAMADOL W/MEDMATCH, U
Desmethyltramadol: 130 ng/mL
Tramadol: 187 ng/mL

## 2018-07-07 NOTE — Progress Notes (Signed)
Hemoglobin is borderline low. We will continue to monitor. Please notify patient.  Creatinine is borderline elevated.  GFR WNL. Please advised patient to avoid NSAIDs.    Please forward labs to PCP.   UDS is consistent with treatment.

## 2018-07-30 MED FILL — HUMIRA PEN 40 MG/0.4ML PNKT: 40 | 28 days supply | Qty: 2 | Fill #1

## 2018-08-01 ENCOUNTER — Other Ambulatory Visit: Payer: Self-pay | Admitting: Rheumatology

## 2018-08-01 NOTE — Telephone Encounter (Signed)
Last Visit: 07/04/18 Next Visit: 12/08/18 UDS: 07/04/18 Narc Agreement: 07/04/18  Okay to refill Tramadol?

## 2018-08-07 ENCOUNTER — Telehealth: Payer: Self-pay | Admitting: Pharmacy Technician

## 2018-08-07 NOTE — Telephone Encounter (Signed)
Received a fax from [facility] regarding a prior authorization for [place]. Authorization has been APPROVED from 08/07/18 to 01/08/19.   Will send document to scan center.  Authorization # [id number] Phone # [phone]  11:54 AM Beatriz Chancellor, CPhT

## 2018-09-01 MED FILL — HUMIRA PEN 40 MG/0.4ML PNKT: 40 | 28 days supply | Qty: 2 | Fill #2

## 2018-09-02 ENCOUNTER — Other Ambulatory Visit: Payer: Self-pay | Admitting: Physician Assistant

## 2018-09-02 NOTE — Telephone Encounter (Signed)
Last Visit: 07/04/18 Next Visit: 12/08/18 UDS: 07/04/18 Narc Agreement: 07/04/18  Last Fill: 08/01/18  Okay to refill Tramadol?

## 2018-09-09 ENCOUNTER — Telehealth: Payer: Self-pay | Admitting: *Deleted

## 2018-09-09 NOTE — Telephone Encounter (Signed)
Patient states she tested 09/06/18 for COVID-19. Patient states she is on Humira and had her last dose 09/04/18. Patient advised to hold Humira. Per Hazel Sams, PA-C, patient advised to hold Humira for 6 weeks.

## 2018-10-06 ENCOUNTER — Other Ambulatory Visit: Payer: Self-pay | Admitting: Rheumatology

## 2018-10-06 NOTE — Telephone Encounter (Signed)
Last Visit: 07/04/18 Next Visit: 12/08/18 UDS: 07/04/18 Narc Agreement: 07/04/18  Last Fill: 09/02/18  Okay to refill Tramadol?

## 2018-10-30 ENCOUNTER — Other Ambulatory Visit: Payer: Self-pay | Admitting: Rheumatology

## 2018-10-30 DIAGNOSIS — Z9225 Personal history of immunosupression therapy: Secondary | ICD-10-CM

## 2018-10-30 DIAGNOSIS — Z79899 Other long term (current) drug therapy: Secondary | ICD-10-CM

## 2018-10-30 NOTE — Telephone Encounter (Signed)
Last Visit: 07/04/18 Next Visit: 12/08/18 Labs: 07/04/18 Hemoglobin is borderline low. Creatinine is borderline elevated. GFR WNL. TB Gold: 11/28/17 Neg   Patient advised she is due to update labs. Patient states she will update today or tomorrow.   Okay to refill 30 day supply per Dr. Estanislado Pandy

## 2018-11-02 LAB — COMPLETE METABOLIC PANEL WITH GFR
AG Ratio: 1.1 (calc) (ref 1.0–2.5)
ALT: 9 U/L (ref 6–29)
AST: 14 U/L (ref 10–35)
Albumin: 3.9 g/dL (ref 3.6–5.1)
Alkaline phosphatase (APISO): 91 U/L (ref 37–153)
BUN/Creatinine Ratio: 19 (calc) (ref 6–22)
BUN: 21 mg/dL (ref 7–25)
CO2: 26 mmol/L (ref 20–32)
Calcium: 9.3 mg/dL (ref 8.6–10.4)
Chloride: 106 mmol/L (ref 98–110)
Creat: 1.13 mg/dL — ABNORMAL HIGH (ref 0.60–0.93)
GFR, Est African American: 56 mL/min/{1.73_m2} — ABNORMAL LOW (ref >=60)
GFR, Est Non African American: 49 mL/min/{1.73_m2} — ABNORMAL LOW (ref >=60)
Globulin: 3.6 g/dL (calc) (ref 1.9–3.7)
Glucose, Bld: 87 mg/dL (ref 65–139)
Potassium: 4.3 mmol/L (ref 3.5–5.3)
Sodium: 142 mmol/L (ref 135–146)
Total Bilirubin: 0.3 mg/dL (ref 0.2–1.2)
Total Protein: 7.5 g/dL (ref 6.1–8.1)

## 2018-11-02 LAB — CBC WITH DIFFERENTIAL/PLATELET
Absolute Monocytes: 467 cells/uL (ref 200–950)
Basophils Absolute: 77 cells/uL (ref 0–200)
Basophils Relative: 1.2 %
Eosinophils Absolute: 320 cells/uL (ref 15–500)
Eosinophils Relative: 5 %
HCT: 34.4 % — ABNORMAL LOW (ref 35.0–45.0)
Hemoglobin: 11.3 g/dL — ABNORMAL LOW (ref 11.7–15.5)
Lymphs Abs: 1869 cells/uL (ref 850–3900)
MCH: 29.1 pg (ref 27.0–33.0)
MCHC: 32.8 g/dL (ref 32.0–36.0)
MCV: 88.7 fL (ref 80.0–100.0)
MPV: 11.5 fL (ref 7.5–12.5)
Monocytes Relative: 7.3 %
Neutro Abs: 3667 cells/uL (ref 1500–7800)
Neutrophils Relative %: 57.3 %
Platelets: 278 10*3/uL (ref 140–400)
RBC: 3.88 10*6/uL (ref 3.80–5.10)
RDW: 13.1 % (ref 11.0–15.0)
Total Lymphocyte: 29.2 %
WBC: 6.4 10*3/uL (ref 3.8–10.8)

## 2018-11-02 LAB — QUANTIFERON-TB GOLD PLUS
Mitogen-NIL: >10 IU/mL
NIL: 0.05 IU/mL
QuantiFERON-TB Gold Plus: NEGATIVE
TB1-NIL: 0.01 IU/mL
TB2-NIL: 0.02 IU/mL

## 2018-11-03 NOTE — Telephone Encounter (Signed)
GFR is mildly decreased, most likely due to the use of Lasix.  Please forward labs to her PCP.

## 2018-11-04 MED FILL — HUMIRA PEN 40 MG/0.4ML PNKT: 40 | 28 days supply | Qty: 2 | Fill #0

## 2018-11-05 ENCOUNTER — Other Ambulatory Visit: Payer: Self-pay | Admitting: Rheumatology

## 2018-11-05 NOTE — Telephone Encounter (Signed)
Last Visit: 07/04/18 Next Visit: 12/08/18 UDS: 07/04/18 Narc Agreement: 07/04/18  Okay to refill Tramadol?  

## 2018-11-18 ENCOUNTER — Ambulatory Visit: Payer: Self-pay

## 2018-11-18 ENCOUNTER — Encounter: Payer: Self-pay | Admitting: Orthopaedic Surgery

## 2018-11-18 ENCOUNTER — Ambulatory Visit: Payer: Medicare Other | Admitting: Orthopaedic Surgery

## 2018-11-18 ENCOUNTER — Other Ambulatory Visit: Payer: Self-pay

## 2018-11-18 VITALS — BP 152/71 | HR 74 | Ht 59.0 in | Wt 219.0 lb

## 2018-11-18 DIAGNOSIS — G8929 Other chronic pain: Secondary | ICD-10-CM

## 2018-11-18 DIAGNOSIS — M25561 Pain in right knee: Secondary | ICD-10-CM

## 2018-11-18 DIAGNOSIS — M1711 Unilateral primary osteoarthritis, right knee: Secondary | ICD-10-CM

## 2018-11-18 MED ORDER — BUPIVACAINE HCL 0.5 % IJ SOLN
2.0000 mL | INTRAMUSCULAR | Status: AC | PRN
  Administered 2018-11-18: 2 mL via INTRA_ARTICULAR

## 2018-11-18 MED ORDER — METHYLPREDNISOLONE ACETATE 40 MG/ML IJ SUSP
80.0000 mg | INTRAMUSCULAR | Status: AC | PRN
  Administered 2018-11-18: 80 mg via INTRA_ARTICULAR

## 2018-11-18 MED ORDER — LIDOCAINE HCL 1 % IJ SOLN
2.0000 mL | INTRAMUSCULAR | Status: AC | PRN
  Administered 2018-11-18: 2 mL

## 2018-11-18 NOTE — Progress Notes (Signed)
Office Visit Note   Patient: [name]           Date of Birth: 1945/02/25           MRN: [id number] Visit Date: 11/18/2018              Requested by: Ann Held, MD [address] [place],  [place] [address] PCP: Ann Held, MD   Assessment & Plan: Visit Diagnoses:  1. Chronic pain of right knee   2. Unilateral primary osteoarthritis, right knee     Plan: End-stage osteoarthritis right knee with a BMI of over 44.  Long discussion regarding treatment options.  Office visit over 45 minutes 50% of the time in counseling.  Have discussed weight loss.  Use of over-the-counter medicines.  Will inject her knee with cortisone.  Has large thighs and smaller calves which probably precludes an effective brace.  Has been using a rolling walker. reevaluate in several months  Follow-Up Instructions: Return if symptoms worsen or fail to improve.   Orders:  Orders Placed This Encounter  Procedures  . Large Joint Inj: R knee  . XR KNEE 3 VIEW RIGHT   No orders of the defined types were placed in this encounter.     Procedures: Large Joint Inj: R knee on 11/18/2018 4:10 PM Indications: pain and diagnostic evaluation Details: 25 G 1.5 in needle, anteromedial approach  Arthrogram: No  Medications: 2 mL lidocaine 1 %; 2 mL bupivacaine 0.5 %; 80 mg methylPREDNISolone acetate 40 MG/ML Procedure, treatment alternatives, risks and benefits explained, specific risks discussed. Consent was given by the patient. Immediately prior to procedure a time out was called to verify the correct patient, procedure, equipment, support staff and site/side marked as required. Patient was prepped and draped in the usual sterile fashion.       Clinical Data: No additional findings.   Subjective: Chief Complaint  Patient presents with  . Right Knee - Pain  Patient presents today for right knee pain. She said that it has been bothering for awhile, but has worsened in the last few  days and seems constant. She said that it is swollen, painful, grinds, and feels like it will give way. She is taking Tylenol or tramadol as needed. She has also been using creams. No previous surgery to that knee.  Had successful left total knee replacement "many years ago" without any problems.  Has a history of rheumatoid arthritis presently taking Humira and treated by Dr. Estanislado Pandy  HPI  Review of Systems   Objective: Vital Signs: BP (!) 152/71   Pulse 74   Ht 4\' 11"  (1.499 m)   Wt 219 lb (99.3 kg)   BMI 44.23 kg/m   Physical Exam Constitutional:      Appearance: She is well-developed.  Eyes:     Pupils: Pupils are equal, round, and reactive to light.  Pulmonary:     Effort: Pulmonary effort is normal.  Skin:    General: Skin is warm and dry.  Neurological:     Mental Status: She is alert and oriented to person, place, and time.  Psychiatric:        Behavior: Behavior normal.     Ortho Exam awake alert and oriented x3.  Comfortable sitting.  Does use a rolling walker.  Ambulatory aid.  Full extension right knee with slight valgus position with weightbearing.  Large knees with possible effusion.  No instability.  Has both medial lateral joint pain.  Positive  patella crepitation.  Has a small popliteal cyst.  Flexed about 95 degrees.  Motor exam intact.  Positive pitting edema of the ankles.  Painless range of motion both hips  Specialty Comments:  No specialty comments available.  Imaging: Xr Knee 3 View Right  Result Date: 11/18/2018 Films of the right knee were obtained in 3 projections standing.  There are end-stage osteoarthritic changes in all 3 compartments.  There is bone-on-bone of both medial lateral compartments and more so laterally where there is several degrees of valgus.  There is ectopic calcification superior to the patella with bone-on-bone in the patellofemoral compartment laterally as well.  Osteoporotic bone.  Films are consistent with end-stage  arthritis which may be a combination of osteoarthritis and rheumatoid arthritis    PMFS History: Patient Active Problem List   Diagnosis Date Noted  . Unilateral primary osteoarthritis, right knee 11/18/2018  . Closed fracture of shaft of metatarsal bone 07/30/2016  . Metatarsal fracture 07/30/2016  . Displaced fracture of second metatarsal bone, right foot, initial encounter for closed fracture 07/26/2016  . Arthritis of shoulder 04/10/2016  . High risk medication use 03/16/2016  . Primary osteoarthritis of both feet 03/16/2016  . Primary osteoarthritis of both knees 03/16/2016  . DDD (degenerative disc disease), lumbar 03/16/2016  . OSA on CPAP 07/27/2015  . Lip numbness 03/19/2015  . TIA (transient ischemic attack) 03/19/2015  . 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 Post op infection 02/05/2015  . History of lumbar laminectomy for spinal cord decompression 01/17/2015  . Lumbar spinal stenosis 01/17/2015  . Primary osteoarthritis of left knee 03/25/2014  . Rheumatoid arthritis of multiple sites with negative rheumatoid factor ([place]) 03/25/2014  . History of total knee replacement, left 03/25/2014  . DERMATITIS 09/28/2008  . CONSTIPATION 05/25/2008  . DYSPNEA ON EXERTION 05/25/2008  . ANEMIA, NORMOCYTIC 04/13/2008  . ANKLE PAIN, BILATERAL 11/25/2007  . HLD (hyperlipidemia) 08/14/2006  . Morbid obesity ([place]) 12/13/2005  . CARPAL TUNNEL SYNDROME 12/13/2005  . PERIPHERAL NEUROPATHY 12/13/2005  . Essential hypertension 12/13/2005  . DYSRHYTHMIA, CARDIAC NOS 12/13/2005   Past Medical History:  Diagnosis Date  . Anemia   . Arthritis    Rheumatoid, followed by Dr. Estanislado Pandy, knees & ankles   . CHF (congestive heart failure) ([place])    pt not aware of this  . Constipation   . GERD (gastroesophageal reflux disease)   . H/O cardiovascular stress test 02/2014   seen by Dr. Einar Gip - told that she was cleared for surgery  . Heart murmur    states she's never had any problems   . Hypertension   . Leg cramps   . Shortness of breath dyspnea    with exertion  . Sleep apnea    CPAP in use- QO night, study done at [facility]- 2007   . Stroke [facility]) 03/2015   TIA    Family History  Problem Relation Age of Onset  . Seizures Mother   . Transient ischemic attack Mother   . Hypertension Mother   . Diabetes Mother   . Congestive Heart Failure Mother   . Arthritis/Rheumatoid Mother   . Parkinson's disease Mother   . Dementia Mother   . Heart disease Father        CHF  . Stroke Sister   . Depression Sister   . Arthritis Brother   . Arthritis Brother   . Arthritis Son   . Hypertension Son     Past Surgical History:  Procedure Laterality Date  .  ABDOMINAL HYSTERECTOMY    . CARPAL TUNNEL RELEASE Right   . COLONOSCOPY    . FOOT SURGERY Bilateral    bunionectomy, calluses , also has several pins & screws  . HAND SURGERY    . JOINT REPLACEMENT     planned for 03/25/2014  . KNEE SURGERY  1990's   . LUMBAR LAMINECTOMY/DECOMPRESSION MICRODISCECTOMY N/A 01/17/2015   Procedure: L2-3, L3-4 Decompression;  Surgeon: [name], MD;  Location: [facility];  Service: Orthopedics;  Laterality: N/A;  . LUMBAR LAMINECTOMY/DECOMPRESSION MICRODISCECTOMY N/A 02/06/2015   Procedure: I AND D LUMBAR WITH WOUND VAC PLACEMENT;  Surgeon: [name], MD;  Location: [facility];  Service: Orthopedics;  Laterality: N/A;  . ORIF TOE FRACTURE Right 07/30/2016   Procedure: OPEN REDUCTION INTERNAL FIXATION (ORIF) RIGHT 2ND METATARSAL (TOE) FRACTURE;  Surgeon: [name], MD;  Location: [facility];  Service: Orthopedics;  Laterality: Right;  . TOTAL KNEE ARTHROPLASTY Left 03/25/2014   Procedure: TOTAL KNEE ARTHROPLASTY;  Surgeon: Garald Balding, MD;  Location: [facility];  Service: Orthopedics;  Laterality: Left;  . TUBAL LIGATION     Social History   Occupational History  . Not on file  Tobacco Use  . Smoking status: Former Smoker    Quit date: 03/16/1980    Years since quitting: 38.7  . Smokeless tobacco:  Never Used  Substance and Sexual Activity  . Alcohol use: No  . Drug use: No  . Sexual activity: Not on file

## 2018-11-24 NOTE — Progress Notes (Signed)
Virtual Visit via Telephone Note  I connected with [name] on 12/08/18 at  8:40 AM EST by telephone and verified that I am speaking with the correct person using two identifiers.  Location: Patient: home  Provider: Clinic  This service was conducted via virtual visit.  Both audio and visual tools were used.  The patient was located at home. I was located in my office.  Consent was obtained prior to the virtual visit and is aware of possible charges through their insurance for this visit.  The patient is an established patient.  Dr. Estanislado Pandy, MD conducted the virtual visit and Hazel Sams, PA-C acted as scribe during the service.  Office staff helped with scheduling follow up visits after the service was conducted.     I discussed the limitations, risks, security and privacy concerns of performing an evaluation and management service by telephone and the availability of in person appointments. I also discussed with the patient that there may be a patient responsible charge related to this service. The patient expressed understanding and agreed to proceed.  CC: Lower back pain  History of Present Illness: Patient is a 73 year old female with a past medical history of seronegative rheumatoid arthritis, osteoarthritis, and DDD.  She is on Humira 40 mg sq once every 14 days.  Her last injection was yesterday.  She denies any recent rheumatoid arthritis flares.  She continues to have pain both hip joints and the right knee joint. She denies any joint swelling. She was evaluated by Dr. Durward Fortes last week to discuss proceeding with a right knee replacement once her BMI is less than 40.  She states the left knee replacement is doing well. She continues to have triggering of the left middle finger occasionally.  She states she has a nodule at the base of the right 5th digit.  She denies any feet pain at this time.  She has chronic lower back pain.  She takes tramadol 50 mg 2 tablets in the  morning and 2 at night as needed for pain relief.   Review of Systems  Constitutional: Negative for fever and malaise/fatigue.  Eyes: Negative for photophobia, pain, discharge and redness.  Respiratory: Negative for cough, shortness of breath and wheezing.   Cardiovascular: Negative for chest pain and palpitations.  Gastrointestinal: Negative for blood in stool, constipation and diarrhea.  Genitourinary: Negative for dysuria.  Musculoskeletal: Positive for back pain and joint pain. Negative for myalgias and neck pain.  Skin: Negative for rash.  Neurological: Negative for dizziness and headaches.  Psychiatric/Behavioral: Negative for depression. The patient is not nervous/anxious and does not have insomnia.       Observations/Objective: Physical Exam  Constitutional: She is oriented to person, place, and time.  Neurological: She is alert and oriented to person, place, and time.  Psychiatric: Mood, memory, affect and judgment normal.   Patient reports morning stiffness for 15-20   minutes.   Patient denies nocturnal pain.  Difficulty dressing/grooming: Denies Difficulty climbing stairs: Reports Difficulty getting out of chair: Reports Difficulty using hands for taps, buttons, cutlery, and/or writing: Denies   Assessment and Plan: Assessment / Plan:     Visit Diagnoses: Rheumatoid arthritis of multiple sites with negative rheumatoid factor ([place]): She has not had any recent rheumatoid arthritis flares.  She is clinically doing well on Humira 40 mg sq once every 14 days.  Her last injection was yesterday.  She has chronic right knee joint pain and lower back pain. She  takes tramadol and tylenol as needed for pain relief. She has no joint swelling at this time.  She will continue on Humira as prescribed.  She does not need any refills at this time.  She was advised to notify us if she develops increased joint pain or joint swelling.  She will follow up in 4 months.   High risk medication  use: Humira 40 mg sq injections every 14 day. CBC and CMP were drawn on 10/31/18.  She is due to update lab work in January and every 3 months.  TB gold negative 10/31/18.   Trigger finger, left middle finger: She has intermittent triggering and tenderness.  She declining needing a cortisone injection at this time.  History of total knee replacement, left: Doing well.  She has no discomfort at this time.   Unilateral primary osteoarthritis, right knee: She would like to proceed with a right knee replacement once she gets her BMI less than 40.  She followed up with Dr. Durward Fortes last week.  She takes tramadol and tylenol as needed for pain relief.  Primary osteoarthritis of both feet: She has no feet pain or joint swelling at this time.  DDD (degenerative disc disease), lumbar: She has chronic lower back pain.  She has no symptoms of radiculopathy.   Other medical conditions are listed as follows:   History of hypertension  History of sleep apnea  History of TIA (transient ischemic attack)  History of hyperlipidemia   Follow Up Instructions: She will follow up in 4 months.    I discussed the assessment and treatment plan with the patient. The patient was provided an opportunity to ask questions and all were answered. The patient agreed with the plan and demonstrated an understanding of the instructions.   The patient was advised to call back or seek an in-person evaluation if the symptoms worsen or if the condition fails to improve as anticipated.  I provided 15 minutes of non-face-to-face time during this encounter.   Bo Merino, MD   Scribed by-  Hazel Sams PA-C

## 2018-11-28 ENCOUNTER — Other Ambulatory Visit: Payer: Self-pay | Admitting: Rheumatology

## 2018-12-01 NOTE — Telephone Encounter (Signed)
Last Visit: 07/04/2018 Next Visit: 12/08/2018 Labs: 10/31/2018 GFR is mildly decreased, most likely due to the use of Lasix.  TB Gold: 10/31/2018 negative.   Okay to refill per Dr. Estanislado Pandy.

## 2018-12-02 MED FILL — HUMIRA PEN 40 MG/0.4ML PNKT: 40 | 28 days supply | Qty: 2 | Fill #0

## 2018-12-03 ENCOUNTER — Other Ambulatory Visit: Payer: Self-pay | Admitting: Rheumatology

## 2018-12-03 NOTE — Telephone Encounter (Signed)
Last Visit: 07/04/2018 Next Visit: 12/08/2018 UDS: 07/04/2018 c/w Narc Agreement: 07/04/2018  Last fill: 11/05/2018   Okay to refill tramadol?

## 2018-12-08 ENCOUNTER — Telehealth (INDEPENDENT_AMBULATORY_CARE_PROVIDER_SITE_OTHER): Payer: Medicare Other | Admitting: Rheumatology

## 2018-12-08 ENCOUNTER — Encounter: Payer: Self-pay | Admitting: Rheumatology

## 2018-12-08 ENCOUNTER — Other Ambulatory Visit: Payer: Self-pay

## 2018-12-08 DIAGNOSIS — Z96652 Presence of left artificial knee joint: Secondary | ICD-10-CM

## 2018-12-08 DIAGNOSIS — M1711 Unilateral primary osteoarthritis, right knee: Secondary | ICD-10-CM

## 2018-12-08 DIAGNOSIS — M0609 Rheumatoid arthritis without rheumatoid factor, multiple sites: Secondary | ICD-10-CM | POA: Diagnosis not present

## 2018-12-08 DIAGNOSIS — Z8669 Personal history of other diseases of the nervous system and sense organs: Secondary | ICD-10-CM

## 2018-12-08 DIAGNOSIS — Z8673 Personal history of transient ischemic attack (TIA), and cerebral infarction without residual deficits: Secondary | ICD-10-CM

## 2018-12-08 DIAGNOSIS — M51369 Other intervertebral disc degeneration, lumbar region without mention of lumbar back pain or lower extremity pain: Secondary | ICD-10-CM

## 2018-12-08 DIAGNOSIS — M65332 Trigger finger, left middle finger: Secondary | ICD-10-CM

## 2018-12-08 DIAGNOSIS — Z79899 Other long term (current) drug therapy: Secondary | ICD-10-CM

## 2018-12-08 DIAGNOSIS — M19071 Primary osteoarthritis, right ankle and foot: Secondary | ICD-10-CM

## 2018-12-08 DIAGNOSIS — M5136 Other intervertebral disc degeneration, lumbar region: Secondary | ICD-10-CM

## 2018-12-08 DIAGNOSIS — Z8639 Personal history of other endocrine, nutritional and metabolic disease: Secondary | ICD-10-CM

## 2018-12-08 DIAGNOSIS — Z8679 Personal history of other diseases of the circulatory system: Secondary | ICD-10-CM

## 2018-12-08 DIAGNOSIS — M19072 Primary osteoarthritis, left ankle and foot: Secondary | ICD-10-CM

## 2018-12-22 ENCOUNTER — Telehealth: Payer: Self-pay | Admitting: Pharmacy Technician

## 2018-12-22 NOTE — Telephone Encounter (Signed)
Submitted a Prior Authorization request to [facility] for [employer] via Cover My Meds. Will update once we receive a response.  10:28 AM Beatriz Chancellor, CPhT

## 2018-12-22 NOTE — Telephone Encounter (Signed)
Received notification from [facility] regarding a prior authorization for [employer]. Authorization has been APPROVED from 12/22/18 to 01/08/20.   Will send document to scan center.  Authorization # [id number] Phone # [phone]

## 2018-12-29 ENCOUNTER — Other Ambulatory Visit: Payer: Self-pay | Admitting: Rheumatology

## 2018-12-29 MED FILL — HUMIRA PEN 40 MG/0.4ML PNKT: 40 | 28 days supply | Qty: 2 | Fill #1

## 2018-12-29 NOTE — Telephone Encounter (Signed)
Last Visit: 12/08/2018 telemedicine  Next Visit: 04/17/2019 UDS: 07/04/2018 c/w Narc Agreement: 07/04/2018  Last fill: 12/03/2018  Okay to refill tramadol?

## 2019-01-27 ENCOUNTER — Telehealth: Payer: Self-pay | Admitting: Rheumatology

## 2019-01-27 DIAGNOSIS — M0609 Rheumatoid arthritis without rheumatoid factor, multiple sites: Secondary | ICD-10-CM

## 2019-01-27 DIAGNOSIS — Z79899 Other long term (current) drug therapy: Secondary | ICD-10-CM

## 2019-01-27 MED FILL — HUMIRA PEN 40 MG/0.4ML PNKT: 40 | 28 days supply | Qty: 2 | Fill #2

## 2019-01-27 NOTE — Telephone Encounter (Signed)
Patient called requesting labwork orders be sent to [facility] on [address] in [place].  Patient states she will be going tomorrow 01/28/19.

## 2019-01-28 ENCOUNTER — Ambulatory Visit: Payer: Medicare PPO | Attending: Family Medicine

## 2019-01-28 DIAGNOSIS — Z23 Encounter for immunization: Secondary | ICD-10-CM | POA: Insufficient documentation

## 2019-01-28 NOTE — Telephone Encounter (Signed)
Lab Orders released.  

## 2019-01-28 NOTE — Progress Notes (Signed)
   Covid-19 Vaccination Clinic  Name:  [name]    MRN: [id number] DOB: 05/10/1945  01/28/2019  [name] was observed post Covid-19 immunization for 15 minutes without incidence. She was provided with Vaccine Information Sheet and instruction to access the V-Safe system.   [name] was instructed to call 911 with any severe reactions post vaccine: Marland Kitchen Difficulty breathing  . Swelling of your face and throat  . A fast heartbeat  . A bad rash all over your body  . Dizziness and weakness    Immunizations Administered    Name Date Dose VIS Date Route   Pfizer COVID-19 Vaccine 01/28/2019 10:58 AM 0.3 mL 12/19/2018 Intramuscular   Manufacturer: [place]   Lot: [id number]   [place]: [id number]

## 2019-01-30 LAB — CBC WITH DIFFERENTIAL/PLATELET
Basophils Absolute: 0.1 10*3/uL (ref 0.0–0.2)
Basos: 1 %
EOS (ABSOLUTE): 0.1 10*3/uL (ref 0.0–0.4)
Eos: 1 %
Hematocrit: 36.7 % (ref 34.0–46.6)
Hemoglobin: 11.8 g/dL (ref 11.1–15.9)
Immature Grans (Abs): 0 10*3/uL (ref 0.0–0.1)
Immature Granulocytes: 0 %
Lymphocytes Absolute: 0.7 10*3/uL (ref 0.7–3.1)
Lymphs: 11 %
MCH: 28.6 pg (ref 26.6–33.0)
MCHC: 32.2 g/dL (ref 31.5–35.7)
MCV: 89 fL (ref 79–97)
Monocytes Absolute: 0.5 10*3/uL (ref 0.1–0.9)
Monocytes: 8 %
Neutrophils Absolute: 5.4 10*3/uL (ref 1.4–7.0)
Neutrophils: 79 %
Platelets: 273 10*3/uL (ref 150–450)
RBC: 4.12 x10E6/uL (ref 3.77–5.28)
RDW: 12.5 % (ref 11.7–15.4)
WBC: 6.8 10*3/uL (ref 3.4–10.8)

## 2019-01-30 LAB — CMP14+EGFR
ALT: 10 IU/L (ref 0–32)
AST: 18 IU/L (ref 0–40)
Albumin/Globulin Ratio: 1.5 (ref 1.2–2.2)
Albumin: 4.1 g/dL (ref 3.7–4.7)
Alkaline Phosphatase: 124 IU/L — ABNORMAL HIGH (ref 39–117)
BUN/Creatinine Ratio: 12 (ref 12–28)
BUN: 12 mg/dL (ref 8–27)
Bilirubin Total: 0.3 mg/dL (ref 0.0–1.2)
CO2: 21 mmol/L (ref 20–29)
Calcium: 9.6 mg/dL (ref 8.7–10.3)
Chloride: 104 mmol/L (ref 96–106)
Creatinine, Ser: 1 mg/dL (ref 0.57–1.00)
GFR calc Af Amer: 65 mL/min/{1.73_m2} (ref >59)
GFR calc non Af Amer: 56 mL/min/{1.73_m2} — ABNORMAL LOW (ref >59)
Globulin, Total: 2.8 g/dL (ref 1.5–4.5)
Glucose: 98 mg/dL (ref 65–99)
Potassium: 4.4 mmol/L (ref 3.5–5.2)
Sodium: 142 mmol/L (ref 134–144)
Total Protein: 6.9 g/dL (ref 6.0–8.5)

## 2019-01-30 NOTE — Telephone Encounter (Signed)
CBC is normal.  CMP shows low GFR which is a stable.

## 2019-01-31 ENCOUNTER — Other Ambulatory Visit: Payer: Self-pay | Admitting: Rheumatology

## 2019-01-31 DIAGNOSIS — Z5181 Encounter for therapeutic drug level monitoring: Secondary | ICD-10-CM

## 2019-02-02 NOTE — Telephone Encounter (Addendum)
Last Visit: 12/08/2018 telemedicine  Next Visit: 04/17/2019 UDS: 07/04/2018 c/w Narc Agreement: 07/04/2018  Patient advised she is due to update her UDS and narc agreement. Patient will come in the office tomorrow to update.   Okay to refill Tramadol?

## 2019-02-03 ENCOUNTER — Other Ambulatory Visit: Payer: Self-pay | Admitting: *Deleted

## 2019-02-03 DIAGNOSIS — Z5181 Encounter for therapeutic drug level monitoring: Secondary | ICD-10-CM

## 2019-02-06 LAB — PAIN MGMT, PROFILE 5 W/CONF, U
Amphetamines: NEGATIVE ng/mL
Barbiturates: NEGATIVE ng/mL
Benzodiazepines: NEGATIVE ng/mL
Cocaine Metabolite: NEGATIVE ng/mL
Creatinine: 207 mg/dL
Marijuana Metabolite: NEGATIVE ng/mL
Methadone Metabolite: NEGATIVE ng/mL
Opiates: NEGATIVE ng/mL
Oxidant: NEGATIVE ug/mL
Oxycodone: NEGATIVE ng/mL
pH: 5.4 (ref 4.5–9.0)

## 2019-02-06 LAB — PAIN MGMT, TRAMADOL W/MEDMATCH, U
Desmethyltramadol: NEGATIVE ng/mL
Tramadol: NEGATIVE ng/mL

## 2019-02-09 ENCOUNTER — Telehealth: Payer: Self-pay

## 2019-02-09 NOTE — Telephone Encounter (Signed)
Patient states her insurance has changed to [facility] and she received a letter in the mail in regards to [place] and a prior authorization. Please check into this. Thanks!

## 2019-02-09 NOTE — Telephone Encounter (Signed)
Received notification from [name] regarding a prior authorization for H[employer]IRA. Authorization has been APPROVED from 01/09/19 to 01/08/20.   Authorization # [id number] Phone # [phone]  Called patient to advise.  11:17 AM Beatriz Chancellor, CPhT

## 2019-02-09 NOTE — Progress Notes (Signed)
Please send ask patient if she takes tramadol on a regular basis or not.  Also reviewed the prescribing history if it is filled every month.  The UDS is not consistent with tramadol.  It is possible that she may be taking it as needed.

## 2019-02-09 NOTE — Progress Notes (Signed)
I called the patient.  She states she could not get the prescription from the pharmacy in time and this the reason her UDS was negative as she missed tramadol for about 3 days.  It is okay to refill prescription when due.

## 2019-02-18 ENCOUNTER — Ambulatory Visit: Payer: Medicare PPO | Attending: Internal Medicine

## 2019-02-18 DIAGNOSIS — Z23 Encounter for immunization: Secondary | ICD-10-CM | POA: Insufficient documentation

## 2019-02-18 NOTE — Progress Notes (Signed)
   Covid-19 Vaccination Clinic  Name:  [name]    MRN: [id number] DOB: [birth year]  02/18/2019  [name] was observed post Covid-19 immunization for 15 minutes without incidence. She was provided with Vaccine Information Sheet and instruction to access the V-Safe system.   [name] was instructed to call 911 with any severe reactions post vaccine: Marland Kitchen Difficulty breathing  . Swelling of your face and throat  . A fast heartbeat  . A bad rash all over your body  . Dizziness and weakness    Immunizations Administered    Name Date Dose VIS Date Route   Pfizer COVID-19 Vaccine 02/18/2019  8:06 AM 0.3 mL 12/19/2018 Intramuscular   Manufacturer: [employer]   Lot: [id number]   [place]: [id number]

## 2019-02-23 ENCOUNTER — Other Ambulatory Visit: Payer: Self-pay | Admitting: Rheumatology

## 2019-02-23 NOTE — Telephone Encounter (Signed)
Last Visit: 12/08/2018 telemedicine  Next Visit: 04/17/2019 Labs:01/29/2019 CBC is normal. CMP shows low GFR which is a stable. TB Gold:  10/31/2018 negative   Okay to refill per Dr. Estanislado Pandy.

## 2019-03-02 MED FILL — HUMIRA PEN 40 MG/0.4ML PNKT: 40 | 28 days supply | Qty: 2 | Fill #0

## 2019-03-06 ENCOUNTER — Other Ambulatory Visit: Payer: Self-pay | Admitting: Rheumatology

## 2019-03-06 NOTE — Telephone Encounter (Signed)
Last Visit:12/08/2018 telemedicine Next Visit:04/17/2019 UDS:02/03/19 c/w Narc Agreement:02/03/19  Okay to refill Tramadol?

## 2019-04-02 MED FILL — HUMIRA PEN 40 MG/0.4ML PNKT: 40 | 28 days supply | Qty: 2 | Fill #1

## 2019-04-07 ENCOUNTER — Other Ambulatory Visit: Payer: Self-pay | Admitting: Physician Assistant

## 2019-04-07 NOTE — Telephone Encounter (Signed)
Last Visit: 12/08/2018 telemedicine  Next Visit: 04/17/2019 UDS: 02/03/19 Narc Agreement:   Last Fill: 03/06/19   Okay to refill Tramadol?

## 2019-04-13 NOTE — Progress Notes (Signed)
Office Visit Note  Patient: [name]             Date of Birth: [birth year]           MRN: [id number]             PCP: Ann Held, MD Referring: Ann Held, MD Visit Date: 04/17/2019 Occupation: @GUAROCC @  Subjective:  Pain in both hands   History of Present Illness: [name] is a 74 y.o. female with history of seronegative rheumatoid arthritis and osteoarthritis.  She is on Humira 40 mg sq injections every 14 days.  She has not missed any doses of Humira recently.  She has noticed a contracture in the left 3rd PIP joint but states it is no longer locking up.  She is having tenderness on the palmar aspect of the right 4th digit.  She denies any recent rheumatoid arthritis flares. She has chronic pain in the right knee joint, but she is not ready to proceed with a knee replacement at this time. She has ongoing trochanteric bursitis of the right hip and intermittent lower back pain.   04/08/19 CBC and CMP stable     Activities of Daily Living:  Patient reports morning stiffness for 10-15  minutes.   Patient Denies nocturnal pain.  Difficulty dressing/grooming: Denies Difficulty climbing stairs: Reports Difficulty getting out of chair: Reports Difficulty using hands for taps, buttons, cutlery, and/or writing: Denies  Review of Systems  Constitutional: Positive for fatigue.  HENT: Positive for mouth dryness. Negative for mouth sores and nose dryness.   Eyes: Negative for pain, visual disturbance and dryness.  Respiratory: Negative for cough, hemoptysis, shortness of breath and difficulty breathing.   Cardiovascular: Negative for chest pain, palpitations, hypertension and swelling in legs/feet.  Gastrointestinal: Positive for constipation. Negative for blood in stool and diarrhea.  Endocrine: Negative for increased urination.  Genitourinary: Negative for painful urination.  Musculoskeletal: Positive for arthralgias, joint pain and morning stiffness. Negative  for joint swelling, myalgias, muscle weakness, muscle tenderness and myalgias.  Skin: Negative for color change, pallor, rash, hair loss, nodules/bumps, skin tightness, ulcers and sensitivity to sunlight.  Allergic/Immunologic: Negative for susceptible to infections.  Neurological: Negative for dizziness, numbness, headaches and weakness.  Hematological: Negative for swollen glands.  Psychiatric/Behavioral: Negative for depressed mood and sleep disturbance. The patient is not nervous/anxious.     PMFS History:  Patient Active Problem List   Diagnosis Date Noted  . Unilateral primary osteoarthritis, right knee 11/18/2018  . Closed fracture of shaft of metatarsal bone 07/30/2016  . Metatarsal fracture 07/30/2016  . Displaced fracture of second metatarsal bone, right foot, initial encounter for closed fracture 07/26/2016  . Arthritis of shoulder 04/10/2016  . High risk medication use 03/16/2016  . Primary osteoarthritis of both feet 03/16/2016  . Primary osteoarthritis of both knees 03/16/2016  . DDD (degenerative disc disease), lumbar 03/16/2016  . OSA on CPAP 07/27/2015  . Lip numbness 03/19/2015  . TIA (transient ischemic attack) 03/19/2015  . 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[place]) 02/06/2015  . Post op infection 02/05/2015  . History of lumbar laminectomy for spinal cord decompression 01/17/2015  . Lumbar spinal stenosis 01/17/2015  . Primary osteoarthritis of left knee 03/25/2014  . Rheumatoid arthritis of multiple sites with negative rheumatoid factor ([place]) 03/25/2014  . History of total knee replacement, left 03/25/2014  . DERMATITIS 09/28/2008  . CONSTIPATION 05/25/2008  . DYSPNEA ON EXERTION 05/25/2008  . ANEMIA, NORMOCYTIC 04/13/2008  . ANKLE PAIN, BILATERAL  11/25/2007  . HLD (hyperlipidemia) 08/14/2006  . Morbid (severe) obesity due to excess calories ([place]) 12/13/2005  . CARPAL TUNNEL SYNDROME 12/13/2005  . PERIPHERAL NEUROPATHY 12/13/2005  . Essential hypertension 12/13/2005    . DYSRHYTHMIA, CARDIAC NOS 12/13/2005    Past Medical History:  Diagnosis Date  . Anemia   . Arthritis    Rheumatoid, followed by Dr. Estanislado Pandy, knees & ankles   . CHF (congestive heart failure) ([place])    pt not aware of this  . Constipation   . GERD (gastroesophageal reflux disease)   . H/O cardiovascular stress test 02/2014   seen by Dr. Einar Gip - told that she was cleared for surgery  . Heart murmur    states she's never had any problems  . Hypertension   . Leg cramps   . Shortness of breath dyspnea    with exertion  . Sleep apnea    CPAP in use- QO night, study done at [facility]- 2007   . Stroke [facility]) 03/2015   TIA    Family History  Problem Relation Age of Onset  . Seizures Mother   . Transient ischemic attack Mother   . Hypertension Mother   . Diabetes Mother   . Congestive Heart Failure Mother   . Arthritis/Rheumatoid Mother   . Parkinson's disease Mother   . Dementia Mother   . Heart disease Father        CHF  . Stroke Sister   . Depression Sister   . Arthritis Brother   . Arthritis Brother   . Arthritis Son   . Hypertension Son    Past Surgical History:  Procedure Laterality Date  . ABDOMINAL HYSTERECTOMY    . CARPAL TUNNEL RELEASE Right   . COLONOSCOPY    . FOOT SURGERY Bilateral    bunionectomy, calluses , also has several pins & screws  . HAND SURGERY    . JOINT REPLACEMENT     planned for 03/25/2014  . KNEE ARTHROPLASTY    . KNEE SURGERY  1990's   . LUMBAR LAMINECTOMY/DECOMPRESSION MICRODISCECTOMY N/A 01/17/2015   Procedure: L2-3, L3-4 Decompression;  Surgeon: [name], MD;  Location: [facility] [place];  Service: Orthopedics;  Laterality: N/A;  . LUMBAR LAMINECTOMY/DECOMPRESSION MICRODISCECTOMY N/A 02/06/2015   Procedure: I AND D LUMBAR WITH WOUND VAC PLACEMENT;  Surgeon: [name], MD;  Location: [place];  Service: Orthopedics;  Laterality: N/A;  . ORIF TOE FRACTURE Right 07/30/2016   Procedure: OPEN REDUCTION INTERNAL FIXATION (ORIF) RIGHT 2ND METATARSAL (TOE)  FRACTURE;  Surgeon: [name], MD;  Location: [facility];  Service: Orthopedics;  Laterality: Right;  . TOTAL KNEE ARTHROPLASTY Left 03/25/2014   Procedure: TOTAL KNEE ARTHROPLASTY;  Surgeon: Garald Balding, MD;  Location: [place];  Service: Orthopedics;  Laterality: Left;  . TUBAL LIGATION     Social History   Social History Narrative   Lives with husband.  2 Sons.  Using walker.    Caffeine  Less then 1 cups daily.   Retired.     Immunization History  Administered Date(s) Administered  . H1N1 12/23/2007  . Influenza Split 11/03/2014  . Influenza Whole 12/19/2005, 11/13/2006  . Influenza,inj,Quad PF,6+ Mos 09/16/2017  . Influenza-Unspecified 11/03/2014  . PFIZER SARS-COV-2 Vaccination 01/28/2019, 02/18/2019  . Pneumococcal Polysaccharide-23 12/19/2005  . Tdap 12/20/2011     Objective: Vital Signs: BP (!) 151/81 (BP Location: Left Arm, Patient Position: Sitting, Cuff Size: Normal)   Pulse 68   Resp 16   Ht 4\' 11"  (  1.499 m)   Wt 231 lb 3.2 oz (104.9 kg)   BMI 46.70 kg/m    Physical Exam Vitals and nursing note reviewed.  Constitutional:      Appearance: She is well-developed.  HENT:     Head: Normocephalic and atraumatic.  Eyes:     Conjunctiva/sclera: Conjunctivae normal.  Pulmonary:     Effort: Pulmonary effort is normal.  Abdominal:     General: Bowel sounds are normal.     Palpations: Abdomen is soft.  Musculoskeletal:     Cervical back: Normal range of motion.  Lymphadenopathy:     Cervical: No cervical adenopathy.  Skin:    General: Skin is warm and dry.     Capillary Refill: Capillary refill takes less than 2 seconds.  Neurological:     Mental Status: She is alert and oriented to person, place, and time.  Psychiatric:        Behavior: Behavior normal.      Musculoskeletal Exam: C-spine, thoracic spine, and lumbar spine good ROM.  Shoulder joint abduction to 90 degrees bilaterally.  Elbow joints and wrist joints good ROM with no tenderness or  inflammation.  Ulnar deviation bilaterally.  PIP and DIP thickening consistent with osteoarhtirits of both knees. Thickening flexor tendon of right 4th digit. Mild flexion contracture of left 3rd PIP joint.  Hip joint ROM difficult to assess.  Left knee replacement warmth but no effusion.  Right knee painful ROM but no warmth or effusion. No tenderness or inflammation of ankle joints.  CDAI Exam: CDAI Score: 0.6  Patient Global: 3 mm; Provider Global: 3 mm Swollen: 0 ; Tender: 0  Joint Exam 04/17/2019   No joint exam has been documented for this visit   There is currently no information documented on the homunculus. Go to the Rheumatology activity and complete the homunculus joint exam.  Investigation: No additional findings.  Imaging: No results found.  Recent Labs: Lab Results  Component Value Date   WBC 6.8 01/29/2019   HGB 11.8 01/29/2019   PLT 273 01/29/2019   NA 142 01/29/2019   K 4.4 01/29/2019   CL 104 01/29/2019   CO2 21 01/29/2019   GLUCOSE 98 01/29/2019   BUN 12 01/29/2019   CREATININE 1.00 01/29/2019   BILITOT 0.3 01/29/2019   ALKPHOS 124 (H) 01/29/2019   AST 18 01/29/2019   ALT 10 01/29/2019   PROT 6.9 01/29/2019   ALBUMIN 4.1 01/29/2019   CALCIUM 9.6 01/29/2019   GFRAA 65 01/29/2019   QFTBGOLDPLUS NEGATIVE 10/31/2018    Speciality Comments: Narcotic Agreement 10/30/16  Procedures:  No procedures performed Allergies: Ramipril   Assessment / Plan:     Visit Diagnoses: Rheumatoid arthritis of multiple sites with negative rheumatoid factor ([place]): She has no synovitis on exam.  She has not had any recent rheumatoid arthritis flares.  She is clinically doing well on Humira 40 mg sq injections every 14 days.  She has not missed any doses of Humira recently.  She has synovial thickening and ulnar deviation of MCP joints on exam.  She continues to experience morning stiffness for 10 to 15 minutes daily.  She will continue on Humira as prescribed.  She was advised  to notify us if she develops increased joint pain or joint swelling.  She will follow-up in the office in 5 months.  High risk medication use - Humira 40 mg sq injections every 14 day.  CBC and CMP were stable on 04/08/2019.  She will return for lab  work in June and every 3 months to monitor for drug toxicity.  TB gold negative on 10/31/2018.  She received her second COVID-19 vaccination on 02/18/2019.  She has not had any recent infections.  Therapeutic drug monitoring - She takes tramadol 50 mg 1 to 2 tablets by mouth daily as needed for pain relief.  UDS & narc agreement: 02/03/2019  Trigger finger, left middle finger: Resolved.  She has a mild flexion contracture of the left third PIP.  We discussed the importance of joint protection and muscle strengthening.  History of total knee replacement, left: Doing well.  Good range of motion with no discomfort.  She has warmth but no effusion on exam.  She walks with a walker to assist with ambulation.  Unilateral primary osteoarthritis, right knee: Chronic pain.  She has painful ROM of the right knee joint.  No warmth or effusion was noted. She walks with a walker to assist with ambulation.   Primary osteoarthritis of both feet: She is not having any discomfort in her feet at this time. She wears proper fitting shoes.   DDD (degenerative disc disease), lumbar: She experiences intermittent discomfort in her lower back. No midline spinal tenderness at this time. She uses a walker to assist with ambulation.   Other medical conditions are listed as follows:  History of TIA (transient ischemic attack)  History of hypertension  History of sleep apnea  History of hyperlipidemia  Orders: No orders of the defined types were placed in this encounter.  No orders of the defined types were placed in this encounter.     Follow-Up Instructions: Return in about 5 months (around 09/17/2019) for Rheumatoid arthritis, Osteoarthritis.   Hazel Sams,  PA-C  I examined and evaluated the patient with Hazel Sams PA.  Patient had no synovitis on examination today.  She is clinically doing well and will continue current treatment.  The plan of care was discussed as noted above.  Bo Merino, MD  Note - This record has been created using Editor, commissioning.  Chart creation errors have been sought, but may not always  have been located. Such creation errors do not reflect on  the standard of medical care.

## 2019-04-17 ENCOUNTER — Ambulatory Visit: Payer: Medicare Other | Admitting: Rheumatology

## 2019-04-17 ENCOUNTER — Other Ambulatory Visit: Payer: Self-pay

## 2019-04-17 ENCOUNTER — Encounter: Payer: Self-pay | Admitting: Rheumatology

## 2019-04-17 VITALS — BP 151/81 | HR 68 | Resp 16 | Ht 59.0 in | Wt 231.2 lb

## 2019-04-17 DIAGNOSIS — Z8639 Personal history of other endocrine, nutritional and metabolic disease: Secondary | ICD-10-CM

## 2019-04-17 DIAGNOSIS — M65332 Trigger finger, left middle finger: Secondary | ICD-10-CM

## 2019-04-17 DIAGNOSIS — M5136 Other intervertebral disc degeneration, lumbar region: Secondary | ICD-10-CM

## 2019-04-17 DIAGNOSIS — Z8669 Personal history of other diseases of the nervous system and sense organs: Secondary | ICD-10-CM

## 2019-04-17 DIAGNOSIS — Z5181 Encounter for therapeutic drug level monitoring: Secondary | ICD-10-CM

## 2019-04-17 DIAGNOSIS — Z8679 Personal history of other diseases of the circulatory system: Secondary | ICD-10-CM

## 2019-04-17 DIAGNOSIS — M0609 Rheumatoid arthritis without rheumatoid factor, multiple sites: Secondary | ICD-10-CM

## 2019-04-17 DIAGNOSIS — M19072 Primary osteoarthritis, left ankle and foot: Secondary | ICD-10-CM

## 2019-04-17 DIAGNOSIS — Z8673 Personal history of transient ischemic attack (TIA), and cerebral infarction without residual deficits: Secondary | ICD-10-CM

## 2019-04-17 DIAGNOSIS — Z96652 Presence of left artificial knee joint: Secondary | ICD-10-CM

## 2019-04-17 DIAGNOSIS — M19071 Primary osteoarthritis, right ankle and foot: Secondary | ICD-10-CM

## 2019-04-17 DIAGNOSIS — Z79899 Other long term (current) drug therapy: Secondary | ICD-10-CM

## 2019-04-17 DIAGNOSIS — M1711 Unilateral primary osteoarthritis, right knee: Secondary | ICD-10-CM

## 2019-04-17 NOTE — Patient Instructions (Signed)
Standing Labs We placed an order today for your standing lab work.    Please come back and get your standing labs in June and every 3 months.   We have open lab daily Monday through Thursday from 8:30-12:30 PM and 1:30-4:30 PM and Friday from 8:30-12:30 PM and 1:30-4:00 PM at the office of Dr. Shaili Deveshwar.   You may experience shorter wait times on Monday and Friday afternoons. The office is located at [address], [address], [place], [place] [address] No appointment is necessary.   Labs are drawn by [facility].  You may receive a bill from [facility] for your lab work.  If you wish to have your labs drawn at another location, please call the office 24 hours in advance to send orders.  If you have any questions regarding directions or hours of operation,  please call [phone].   Just as a reminder please drink plenty of water prior to coming for your lab work. Thanks!  

## 2019-05-04 MED FILL — HUMIRA PEN 40 MG/0.4ML PNKT: 40 | 28 days supply | Qty: 2 | Fill #2

## 2019-05-07 ENCOUNTER — Other Ambulatory Visit: Payer: Self-pay | Admitting: Physician Assistant

## 2019-05-07 NOTE — Telephone Encounter (Signed)
Last Visit: 04/17/2019 Next Visit: 09/25/2019 UDS: 02/03/2019  Narc Agreement:02/03/2019   Last fill: 04/07/2019   Okay to refill tramadol?

## 2019-05-18 ENCOUNTER — Telehealth: Payer: Self-pay | Admitting: Rheumatology

## 2019-05-18 DIAGNOSIS — Z79899 Other long term (current) drug therapy: Secondary | ICD-10-CM

## 2019-05-18 NOTE — Telephone Encounter (Signed)
Lab orders released for [facility]. Advised patient, patient verbalized understanding.

## 2019-05-18 NOTE — Telephone Encounter (Signed)
Patient called requesting her labwork orders be sent to [facility] in [place] on [address].  Patient will be going tomorrow morning, 05/19/19.

## 2019-05-20 LAB — CMP14+EGFR
ALT: 10 IU/L (ref 0–32)
AST: 17 IU/L (ref 0–40)
Albumin/Globulin Ratio: 1.2 (ref 1.2–2.2)
Albumin: 4 g/dL (ref 3.7–4.7)
Alkaline Phosphatase: 132 IU/L — ABNORMAL HIGH (ref 39–117)
BUN/Creatinine Ratio: 21 (ref 12–28)
BUN: 25 mg/dL (ref 8–27)
Bilirubin Total: <0.2 mg/dL (ref 0.0–1.2)
CO2: 24 mmol/L (ref 20–29)
Calcium: 10 mg/dL (ref 8.7–10.3)
Chloride: 103 mmol/L (ref 96–106)
Creatinine, Ser: 1.19 mg/dL — ABNORMAL HIGH (ref 0.57–1.00)
GFR calc Af Amer: 52 mL/min/{1.73_m2} — ABNORMAL LOW (ref >59)
GFR calc non Af Amer: 45 mL/min/{1.73_m2} — ABNORMAL LOW (ref >59)
Globulin, Total: 3.4 g/dL (ref 1.5–4.5)
Glucose: 80 mg/dL (ref 65–99)
Potassium: 4.6 mmol/L (ref 3.5–5.2)
Sodium: 141 mmol/L (ref 134–144)
Total Protein: 7.4 g/dL (ref 6.0–8.5)

## 2019-05-20 LAB — CBC WITH DIFFERENTIAL/PLATELET
Basophils Absolute: 0.1 10*3/uL (ref 0.0–0.2)
Basos: 1 %
EOS (ABSOLUTE): 0.2 10*3/uL (ref 0.0–0.4)
Eos: 3 %
Hematocrit: 35.7 % (ref 34.0–46.6)
Hemoglobin: 11.4 g/dL (ref 11.1–15.9)
Immature Grans (Abs): 0 10*3/uL (ref 0.0–0.1)
Immature Granulocytes: 0 %
Lymphocytes Absolute: 2.1 10*3/uL (ref 0.7–3.1)
Lymphs: 30 %
MCH: 28.4 pg (ref 26.6–33.0)
MCHC: 31.9 g/dL (ref 31.5–35.7)
MCV: 89 fL (ref 79–97)
Monocytes Absolute: 0.5 10*3/uL (ref 0.1–0.9)
Monocytes: 7 %
Neutrophils Absolute: 4 10*3/uL (ref 1.4–7.0)
Neutrophils: 59 %
Platelets: 248 10*3/uL (ref 150–450)
RBC: 4.02 x10E6/uL (ref 3.77–5.28)
RDW: 13.1 % (ref 11.7–15.4)
WBC: 6.9 10*3/uL (ref 3.4–10.8)

## 2019-05-20 NOTE — Telephone Encounter (Signed)
Patient is only getting Humira from [place].  GFR is low most likely due to Lasix use.  Her alkaline phosphatase is mildly elevated.  We will monitor for now.  CBC is normal.

## 2019-05-26 ENCOUNTER — Other Ambulatory Visit: Payer: Self-pay | Admitting: Rheumatology

## 2019-05-26 NOTE — Telephone Encounter (Signed)
Last Visit: 04/17/2019 Next Visit: 09/25/2019 Labs: 05/19/2019 GFR is low most likely due to Lasix use. Her alkaline phosphatase is mildly elevated. We will monitor for now. CBC is normal. TB Gold: 10/31/2018 negative   Okay to refill per Dr. Estanislado Pandy.

## 2019-05-28 MED FILL — HUMIRA PEN 40 MG/0.4ML PNKT: 40 | 28 days supply | Qty: 2 | Fill #0

## 2019-06-05 ENCOUNTER — Other Ambulatory Visit: Payer: Self-pay | Admitting: Physician Assistant

## 2019-06-05 NOTE — Telephone Encounter (Signed)
Last Visit: 04/17/2019 Next Visit: 09/25/2019 UDS: 02/03/2019  Narc Agreement:02/03/2019   Last fill: 05/07/2019   Okay to refill tramadol?

## 2019-06-25 MED FILL — HUMIRA PEN 40 MG/0.4ML PNKT: 40 | 28 days supply | Qty: 2 | Fill #1

## 2019-07-06 ENCOUNTER — Other Ambulatory Visit: Payer: Self-pay | Admitting: *Deleted

## 2019-07-06 MED ORDER — TRAMADOL HCL 50 MG PO TABS: ORAL_TABLET | ORAL | 0 refills | Status: DC

## 2019-07-06 NOTE — Telephone Encounter (Signed)
Last Visit:04/17/2019 Next Visit:09/25/2019 UDS:02/03/2019 Narc Agreement:02/03/2019   Last fill: 06/05/2019  Patient left message stating she is [place], [place] visiting her son and would like to know if we can send prescription to the pharmacy where is is currently at.   Okay to refill tramadol?

## 2019-07-23 MED FILL — HUMIRA PEN 40 MG/0.4ML PNKT: 40 | 28 days supply | Qty: 2 | Fill #2

## 2019-08-05 ENCOUNTER — Other Ambulatory Visit: Payer: Self-pay | Admitting: Physician Assistant

## 2019-08-05 NOTE — Telephone Encounter (Signed)
Last Visit:04/17/2019 Next Visit:09/25/2019 UDS:02/03/2019 Narc Agreement:02/03/2019   Last fill: 07/06/2019  Patient advised she is due to update her UDS and narcotic agreement. Patient states she will try to come tomorrow.   Okay to refill Tramadol?

## 2019-08-06 ENCOUNTER — Other Ambulatory Visit: Payer: Self-pay | Admitting: *Deleted

## 2019-08-06 DIAGNOSIS — Z5181 Encounter for therapeutic drug level monitoring: Secondary | ICD-10-CM

## 2019-08-08 LAB — DRUG MONITOR, PANEL 5, W/CONF, URINE
Amphetamines: NEGATIVE ng/mL (ref <500)
Barbiturates: NEGATIVE ng/mL (ref <300)
Benzodiazepines: NEGATIVE ng/mL (ref <100)
Cocaine Metabolite: NEGATIVE ng/mL (ref <150)
Creatinine: 225.6 mg/dL
Marijuana Metabolite: NEGATIVE ng/mL (ref <20)
Methadone Metabolite: NEGATIVE ng/mL (ref <100)
Opiates: NEGATIVE ng/mL (ref <100)
Oxidant: NEGATIVE ug/mL
Oxycodone: NEGATIVE ng/mL (ref <100)
pH: 7.1 (ref 4.5–9.0)

## 2019-08-08 LAB — DRUG MONITOR, TRAMADOL,QN, URINE
Desmethyltramadol: 6372 ng/mL — ABNORMAL HIGH (ref <100)
Tramadol: >10000 ng/mL — ABNORMAL HIGH (ref <100)

## 2019-08-08 LAB — DM TEMPLATE

## 2019-08-10 NOTE — Progress Notes (Signed)
Labs are consistent with tramadol use.

## 2019-08-18 ENCOUNTER — Other Ambulatory Visit: Payer: Self-pay | Admitting: Rheumatology

## 2019-08-18 DIAGNOSIS — Z79899 Other long term (current) drug therapy: Secondary | ICD-10-CM

## 2019-08-18 NOTE — Telephone Encounter (Signed)
Last Visit:04/17/2019 Next Visit:09/25/2019 Labs: 05/19/2019 GFR is low most likely due to Lasix use. Her alkaline phosphatase is mildly elevated. We will monitor for now. CBC is normal. TB gold: 10/31/2019  Current Dose per office note on 04/17/2019: Humira 40 mg sq injections every 14 days  Patient advised she is due to update labs. Released orders so patient may go have them updated.   Okay to refill 30 day supply Humira?

## 2019-08-19 LAB — CMP14+EGFR
ALT: 12 IU/L (ref 0–32)
AST: 17 IU/L (ref 0–40)
Albumin/Globulin Ratio: 1.3 (ref 1.2–2.2)
Albumin: 4.1 g/dL (ref 3.7–4.7)
Alkaline Phosphatase: 124 IU/L — ABNORMAL HIGH (ref 48–121)
BUN/Creatinine Ratio: 13 (ref 12–28)
BUN: 15 mg/dL (ref 8–27)
Bilirubin Total: 0.2 mg/dL (ref 0.0–1.2)
CO2: 26 mmol/L (ref 20–29)
Calcium: 9.7 mg/dL (ref 8.7–10.3)
Chloride: 104 mmol/L (ref 96–106)
Creatinine, Ser: 1.13 mg/dL — ABNORMAL HIGH (ref 0.57–1.00)
GFR calc Af Amer: 56 mL/min/{1.73_m2} — ABNORMAL LOW (ref >59)
GFR calc non Af Amer: 48 mL/min/{1.73_m2} — ABNORMAL LOW (ref >59)
Globulin, Total: 3.1 g/dL (ref 1.5–4.5)
Glucose: 91 mg/dL (ref 65–99)
Potassium: 5.1 mmol/L (ref 3.5–5.2)
Sodium: 143 mmol/L (ref 134–144)
Total Protein: 7.2 g/dL (ref 6.0–8.5)

## 2019-08-19 LAB — CBC WITH DIFFERENTIAL/PLATELET
Basophils Absolute: 0.1 10*3/uL (ref 0.0–0.2)
Basos: 1 %
EOS (ABSOLUTE): 0.1 10*3/uL (ref 0.0–0.4)
Eos: 1 %
Hematocrit: 35.1 % (ref 34.0–46.6)
Hemoglobin: 11.5 g/dL (ref 11.1–15.9)
Immature Grans (Abs): 0 10*3/uL (ref 0.0–0.1)
Immature Granulocytes: 0 %
Lymphocytes Absolute: 2.5 10*3/uL (ref 0.7–3.1)
Lymphs: 29 %
MCH: 28.8 pg (ref 26.6–33.0)
MCHC: 32.8 g/dL (ref 31.5–35.7)
MCV: 88 fL (ref 79–97)
Monocytes Absolute: 0.6 10*3/uL (ref 0.1–0.9)
Monocytes: 7 %
Neutrophils Absolute: 5.1 10*3/uL (ref 1.4–7.0)
Neutrophils: 62 %
Platelets: 292 10*3/uL (ref 150–450)
RBC: 4 x10E6/uL (ref 3.77–5.28)
RDW: 12.5 % (ref 11.7–15.4)
WBC: 8.4 10*3/uL (ref 3.4–10.8)

## 2019-08-19 NOTE — Telephone Encounter (Signed)
CBC is normal.  GFR is low but is stable.  Alkaline phosphatase is better.

## 2019-09-03 MED FILL — HUMIRA PEN 40 MG/0.4ML PNKT: 40 | 28 days supply | Qty: 2 | Fill #0

## 2019-09-05 ENCOUNTER — Other Ambulatory Visit: Payer: Self-pay | Admitting: Physician Assistant

## 2019-09-07 NOTE — Telephone Encounter (Signed)
Last Visit:04/17/2019 Next Visit:09/25/2019 UDS:08/06/2019 Narc Agreement:02/03/2019   Last fill: 08/05/2019  Okay to refill per Dr. Estanislado Pandy

## 2019-09-16 NOTE — Progress Notes (Signed)
Office Visit Note  Patient: [name]             Date of Birth: August 13, 1945           MRN: [id number]             PCP: Ann Held, MD Referring: Ann Held, MD Visit Date: 09/18/2019 Occupation: @GUAROCC @  Subjective:  Other (right wrist pain and swelling )   History of Present Illness: [name] is a 74 y.o. female with history of rheumatoid arthritis and osteoarthritis.  She states she has been taking Humira every other week which has been working well for her.  About 2 weeks ago she went to hospice then dropped some cards there.  Since then she has been having pain and swelling in her right wrist joint.  She has some discomfort in her both shoulders.  The trigger finger has resolved.  Activities of Daily Living:  Patient reports morning stiffness for 20  minutes.   Patient Reports nocturnal pain.  Difficulty dressing/grooming: Denies Difficulty climbing stairs: Reports Difficulty getting out of chair: Reports Difficulty using hands for taps, buttons, cutlery, and/or writing: Reports  Review of Systems  Constitutional: Negative for fatigue.  HENT: Positive for mouth dryness. Negative for mouth sores and nose dryness.   Eyes: Positive for dryness. Negative for itching.  Respiratory: Negative for shortness of breath and difficulty breathing.   Cardiovascular: Negative for chest pain and palpitations.  Gastrointestinal: Positive for constipation. Negative for blood in stool and diarrhea.  Endocrine: Negative for increased urination.  Genitourinary: Positive for involuntary urination. Negative for difficulty urinating.  Musculoskeletal: Positive for arthralgias, joint pain, joint swelling, myalgias, muscle weakness, morning stiffness, muscle tenderness and myalgias.  Skin: Negative for color change, rash and redness.  Allergic/Immunologic: Negative for susceptible to infections.  Neurological: Positive for numbness. Negative for dizziness, headaches,  memory loss and weakness.  Hematological: Negative for bruising/bleeding tendency.  Psychiatric/Behavioral: Negative for confusion.    PMFS History:  Patient Active Problem List   Diagnosis Date Noted  . Unilateral primary osteoarthritis, right knee 11/18/2018  . Closed fracture of shaft of metatarsal bone 07/30/2016  . Metatarsal fracture 07/30/2016  . Displaced fracture of second metatarsal bone, right foot, initial encounter for closed fracture 07/26/2016  . Arthritis of shoulder 04/10/2016  . High risk medication use 03/16/2016  . Primary osteoarthritis of both feet 03/16/2016  . Primary osteoarthritis of both knees 03/16/2016  . DDD (degenerative disc disease), lumbar 03/16/2016  . OSA on CPAP 07/27/2015  . Lip numbness 03/19/2015  . TIA (transient ischemic attack) 03/19/2015  . 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[place]) 02/06/2015  . Post op infection 02/05/2015  . History of lumbar laminectomy for spinal cord decompression 01/17/2015  . Lumbar spinal stenosis 01/17/2015  . Primary osteoarthritis of left knee 03/25/2014  . Rheumatoid arthritis of multiple sites with negative rheumatoid factor ([place]) 03/25/2014  . History of total knee replacement, left 03/25/2014  . DERMATITIS 09/28/2008  . CONSTIPATION 05/25/2008  . DYSPNEA ON EXERTION 05/25/2008  . ANEMIA, NORMOCYTIC 04/13/2008  . ANKLE PAIN, BILATERAL 11/25/2007  . HLD (hyperlipidemia) 08/14/2006  . Morbid (severe) obesity due to excess calories ([place]) 12/13/2005  . CARPAL TUNNEL SYNDROME 12/13/2005  . PERIPHERAL NEUROPATHY 12/13/2005  . Essential hypertension 12/13/2005  . DYSRHYTHMIA, CARDIAC NOS 12/13/2005    Past Medical History:  Diagnosis Date  . Anemia   . Arthritis    Rheumatoid, followed by Dr. Estanislado Pandy, knees & ankles   .  CHF (congestive heart failure) ([place])    pt not aware of this  . Constipation   . GERD (gastroesophageal reflux disease)   . H/O cardiovascular stress test 02/2014   seen by Dr. Einar Gip - told  that she was cleared for surgery  . Heart murmur    states she's never had any problems  . Hypertension   . Leg cramps   . Shortness of breath dyspnea    with exertion  . Sleep apnea    CPAP in use- QO night, study done at [facility]- 2007   . Stroke [facility]) 03/2015   TIA    Family History  Problem Relation Age of Onset  . Seizures Mother   . Transient ischemic attack Mother   . Hypertension Mother   . Diabetes Mother   . Congestive Heart Failure Mother   . Arthritis/Rheumatoid Mother   . Parkinson's disease Mother   . Dementia Mother   . Heart disease Father        CHF  . Stroke Sister   . Depression Sister   . Arthritis Brother   . Arthritis Brother   . Arthritis Son   . Hypertension Son    Past Surgical History:  Procedure Laterality Date  . ABDOMINAL HYSTERECTOMY    . CARPAL TUNNEL RELEASE Right   . COLONOSCOPY    . FOOT SURGERY Bilateral    bunionectomy, calluses , also has several pins & screws  . HAND SURGERY    . JOINT REPLACEMENT     planned for 03/25/2014  . KNEE ARTHROPLASTY    . KNEE SURGERY  1990's   . LUMBAR LAMINECTOMY/DECOMPRESSION MICRODISCECTOMY N/A 01/17/2015   Procedure: L2-3, L3-4 Decompression;  Surgeon: [name], MD;  Location: [place];  Service: Orthopedics;  Laterality: N/A;  . LUMBAR LAMINECTOMY/DECOMPRESSION MICRODISCECTOMY N/A 02/06/2015   Procedure: I AND D LUMBAR WITH WOUND VAC PLACEMENT;  Surgeon: [name], MD;  Location: [place];  Service: Orthopedics;  Laterality: N/A;  . ORIF TOE FRACTURE Right 07/30/2016   Procedure: OPEN REDUCTION INTERNAL FIXATION (ORIF) RIGHT 2ND METATARSAL (TOE) FRACTURE;  Surgeon: [name], MD;  Location: [place];  Service: Orthopedics;  Laterality: Right;  . TOTAL KNEE ARTHROPLASTY Left 03/25/2014   Procedure: TOTAL KNEE ARTHROPLASTY;  Surgeon: Garald Balding, MD;  Location: [place];  Service: Orthopedics;  Laterality: Left;  . TUBAL LIGATION     Social History   Social History Narrative   Lives with husband.   2 Sons.  Using walker.    Caffeine  Less then 1 cups daily.   Retired.     Immunization History  Administered Date(s) Administered  . H1N1 12/23/2007  . Influenza Split 11/03/2014  . Influenza Whole 12/19/2005, 11/13/2006  . Influenza,inj,Quad PF,6+ Mos 09/16/2017  . Influenza-Unspecified 11/03/2014  . PFIZER SARS-COV-2 Vaccination 01/28/2019, 02/18/2019  . Pneumococcal Polysaccharide-23 12/19/2005  . Tdap 12/20/2011     Objective: Vital Signs: BP (!) 146/84 (BP Location: Left Arm, Patient Position: Sitting, Cuff Size: Large)   Pulse 66   Resp 16   Ht 4\' 11"  (1.499 m)   Wt 229 lb 12.8 oz (104.2 kg)   BMI 46.41 kg/m    Physical Exam Vitals and nursing note reviewed.  Constitutional:      Appearance: She is well-developed.  HENT:     Head: Normocephalic and atraumatic.  Eyes:     Conjunctiva/sclera: Conjunctivae normal.  Cardiovascular:     Rate and Rhythm: Normal rate and regular rhythm.  Heart sounds: Normal heart sounds.  Pulmonary:     Effort: Pulmonary effort is normal.     Breath sounds: Normal breath sounds.  Abdominal:     General: Bowel sounds are normal.     Palpations: Abdomen is soft.  Musculoskeletal:     Cervical back: Normal range of motion.  Lymphadenopathy:     Cervical: No cervical adenopathy.  Skin:    General: Skin is warm and dry.     Capillary Refill: Capillary refill takes less than 2 seconds.  Neurological:     Mental Status: She is alert and oriented to person, place, and time.  Psychiatric:        Behavior: Behavior normal.      Musculoskeletal Exam: C-spine was in good range of motion.  Thoracic spine was difficult to assess as she walks with the help of a walker.  Shoulder joints were in some discomfort with range of motion.  Elbow joints in good range of motion.  She has some tenderness on palpation of her right wrist joint with no synovitis.  No synovitis was noted over MCPs or PIPs.  Knee joints and ankle joints in good range of  motion.  She has bilateral pedal edema.  No tenderness was noted over MTPs.  CDAI Exam: CDAI Score: 3.8  Patient Global: 5 mm; Provider Global: 3 mm Swollen: 0 ; Tender: 3  Joint Exam 09/18/2019      Right  Left  Glenohumeral   Tender   Tender  Wrist   Tender        Investigation: No additional findings.  Imaging: No results found.  Recent Labs: Lab Results  Component Value Date   WBC 8.4 08/18/2019   HGB 11.5 08/18/2019   PLT 292 08/18/2019   NA 143 08/18/2019   K 5.1 08/18/2019   CL 104 08/18/2019   CO2 26 08/18/2019   GLUCOSE 91 08/18/2019   BUN 15 08/18/2019   CREATININE 1.13 (H) 08/18/2019   BILITOT 0.2 08/18/2019   ALKPHOS 124 (H) 08/18/2019   AST 17 08/18/2019   ALT 12 08/18/2019   PROT 7.2 08/18/2019   ALBUMIN 4.1 08/18/2019   CALCIUM 9.7 08/18/2019   GFRAA 56 (L) 08/18/2019   QFTBGOLDPLUS NEGATIVE 10/31/2018    Speciality Comments: Narcotic Agreement 10/30/16  Procedures:  No procedures performed Allergies: Ramipril   Assessment / Plan:     Visit Diagnoses: Rheumatoid arthritis of multiple sites with negative rheumatoid factor (HCC)-she has been experiencing increased pain and discomfort in her right knee joint.  She has been writing cards for the last 2 weeks.  No synovitis was noted but she has some tenderness.  She also had discomfort range of motion of her shoulder joints.  She has been taking Humira on a regular basis but she has been tolerating well.  High risk medication use - Humira 40 mg sq injections every 14 day.  Her labs have been stable.  She has low GFR.  She will get labs in November and then every 3 months to monitor for drug toxicity.- Plan: QuantiFERON-TB Gold Plus with your next labs.  History of total knee replacement, left-she had good range of motion without discomfort.  Unilateral primary osteoarthritis, right knee-she had no warmth swelling or effusion.  Primary osteoarthritis of both feet-she had no tenderness on  palpation.  Therapeutic drug monitoring - Tramadol 50 mg 1 to 2 tablets by mouth daily PRN for pain relief.  She states she is unable to function without  tramadol.  Side effects of tramadol were reviewed.  DDD (degenerative disc disease), lumbar-she has chronic lower back pain.  She uses a walker.  History of TIA (transient ischemic attack)  History of hypertension-blood pressure was elevated today.  Have advised her to monitor blood pressure closely.  History of sleep apnea  History of hyperlipidemia  Educated about COVID-19 virus infection-she is fully immunized against COVID-19.  Have advised her to get a COVID-19 booster vaccine.  Use of mask, social distancing and hand hygiene was discussed.  Use of monoclonal antibodies were also discussed in case she develops COVID-19 infection.  All the instructions were placed in the AVS.  Orders: Orders Placed This Encounter  Procedures  . QuantiFERON-TB Gold Plus   No orders of the defined types were placed in this encounter.     Follow-Up Instructions: Return in about 5 months (around 02/18/2020) for Rheumatoid arthritis.   Bo Merino, MD  Note - This record has been created using Editor, commissioning.  Chart creation errors have been sought, but may not always  have been located. Such creation errors do not reflect on  the standard of medical care.

## 2019-09-18 ENCOUNTER — Encounter: Payer: Self-pay | Admitting: Rheumatology

## 2019-09-18 ENCOUNTER — Other Ambulatory Visit: Payer: Self-pay

## 2019-09-18 ENCOUNTER — Ambulatory Visit: Payer: Medicare PPO | Admitting: Rheumatology

## 2019-09-18 VITALS — BP 146/84 | HR 66 | Resp 16 | Ht 59.0 in | Wt 229.8 lb

## 2019-09-18 DIAGNOSIS — Z96652 Presence of left artificial knee joint: Secondary | ICD-10-CM

## 2019-09-18 DIAGNOSIS — M19071 Primary osteoarthritis, right ankle and foot: Secondary | ICD-10-CM

## 2019-09-18 DIAGNOSIS — M5136 Other intervertebral disc degeneration, lumbar region: Secondary | ICD-10-CM

## 2019-09-18 DIAGNOSIS — M0609 Rheumatoid arthritis without rheumatoid factor, multiple sites: Secondary | ICD-10-CM

## 2019-09-18 DIAGNOSIS — M1711 Unilateral primary osteoarthritis, right knee: Secondary | ICD-10-CM | POA: Diagnosis not present

## 2019-09-18 DIAGNOSIS — Z8669 Personal history of other diseases of the nervous system and sense organs: Secondary | ICD-10-CM

## 2019-09-18 DIAGNOSIS — Z8679 Personal history of other diseases of the circulatory system: Secondary | ICD-10-CM

## 2019-09-18 DIAGNOSIS — Z7189 Other specified counseling: Secondary | ICD-10-CM

## 2019-09-18 DIAGNOSIS — Z5181 Encounter for therapeutic drug level monitoring: Secondary | ICD-10-CM

## 2019-09-18 DIAGNOSIS — Z79899 Other long term (current) drug therapy: Secondary | ICD-10-CM

## 2019-09-18 DIAGNOSIS — M19072 Primary osteoarthritis, left ankle and foot: Secondary | ICD-10-CM

## 2019-09-18 DIAGNOSIS — Z8673 Personal history of transient ischemic attack (TIA), and cerebral infarction without residual deficits: Secondary | ICD-10-CM

## 2019-09-18 DIAGNOSIS — Z8639 Personal history of other endocrine, nutritional and metabolic disease: Secondary | ICD-10-CM

## 2019-09-18 NOTE — Patient Instructions (Signed)
COVID-19 vaccine recommendations:   COVID-19 vaccine is recommended for everyone (unless you are allergic to a vaccine component), even if you are on a medication that suppresses your immune system.   If you are on Methotrexate, Cellcept (mycophenolate), Rinvoq, Morrie Sheldon, and Olumiant- hold the medication for 1 week after each vaccine. Hold Methotrexate for 2 weeks after the single dose COVID-19 vaccine.   If you are on Orencia subcutaneous injection - hold medication one week prior to and one week after the first COVID-19 vaccine dose (only).   If you are on Orencia IV infusions- time vaccination administration so that the first COVID-19 vaccination will occur four weeks after the infusion and postpone the subsequent infusion by one week.   If you are on Cyclophosphamide or Rituxan infusions please contact your doctor prior to receiving the COVID-19 vaccine.   Do not take Tylenol or any anti-inflammatory medications (NSAIDs) 24 hours prior to the COVID-19 vaccination.   There is no direct evidence about the efficacy of the COVID-19 vaccine in individuals who are on medications that suppress the immune system.   Even if you are fully vaccinated, and you are on any medications that suppress your immune system, please continue to wear a mask, maintain at least six feet social distance and practice hand hygiene.   If you develop a COVID-19 infection, please contact your PCP or our office to determine if you need antibody infusion.  The booster vaccine is now available for immunocompromised patients. It is advised that if you had Pfizer vaccine you should get [employer] booster.  If you had a Moderna vaccine then you should get a Moderna booster. Johnson and Wynetta Emery does not have a booster vaccine at this time.  Please see the following web sites for updated information.    [email or web address]://[email or web address]/[email or web address]/[email or web address]-Vaccination-Patient-Resources.[email or web address]  [email or web address]/[email or web address]  Standing Labs We placed an order today for your standing lab work.   Please have your standing labs drawn in November (CBC with differential, CMP with GFR and TB Gold) then CBC with differential and CMP with GFR every 3 months  If possible, please have your labs drawn 2 weeks prior to your appointment so that the provider can discuss your results at your appointment.  We have open lab daily Monday through Thursday from 8:30-12:30 PM and 1:30-4:30 PM and Friday from 8:30-12:30 PM and 1:30-4:00 PM at the office of Dr. Bo Merino, [facility].   Please be advised, patients with office appointments requiring lab work will take precedents over walk-in lab work.  If possible, please come for your lab work on Monday and Friday afternoons, as you may experience shorter wait times. The office is located at [address] [place], [place], [place] [address] No appointment is necessary.   Labs are drawn by [facility]. Please bring your co-pay at the time of your lab draw.  You may receive a bill from [facility] for your lab work.  If you wish to have your labs drawn at another location, please call the office 24 hours in advance to send orders.  If you have any questions regarding directions or hours of operation,  please call [phone].   As a reminder, please drink plenty of water prior to coming for your lab work. Thanks!

## 2019-09-25 ENCOUNTER — Ambulatory Visit: Payer: Medicare PPO | Admitting: Rheumatology

## 2019-09-29 ENCOUNTER — Other Ambulatory Visit: Payer: Self-pay | Admitting: Rheumatology

## 2019-09-29 NOTE — Telephone Encounter (Signed)
Last Visit: 09/18/2019 Next Visit: 02/18/2020 Labs: 08/18/2019 CBC is normal. GFR is low but is stable. Alkaline phosphatase is better. TB Gold: 10/31/2018 Neg    Current Dose per office note 09/18/2019: Humira 40 mg sq injections every 14 day  YD:[id number] arthritis of multiple sites with negative rheumatoid factor   Okay to refill Humira?

## 2019-10-02 ENCOUNTER — Ambulatory Visit: Payer: Medicare PPO | Admitting: Physician Assistant

## 2019-10-05 MED FILL — HUMIRA PEN 40 MG/0.4ML PNKT: 40 | 28 days supply | Qty: 2 | Fill #0

## 2019-10-08 ENCOUNTER — Other Ambulatory Visit: Payer: Self-pay | Admitting: Rheumatology

## 2019-10-08 NOTE — Telephone Encounter (Signed)
Last Visit: 09/18/2019 Next Visit: 02/18/2020 UDS: 08/06/2019 Narc Agreement: 02/03/2019  Okay to refill Tramadol?

## 2019-10-26 ENCOUNTER — Encounter: Payer: Self-pay | Admitting: Physical Therapy

## 2019-10-26 ENCOUNTER — Ambulatory Visit: Payer: Medicare PPO | Attending: Family Medicine | Admitting: Physical Therapy

## 2019-10-26 ENCOUNTER — Other Ambulatory Visit: Payer: Self-pay

## 2019-10-26 ENCOUNTER — Other Ambulatory Visit: Payer: Self-pay | Admitting: Rheumatology

## 2019-10-26 ENCOUNTER — Other Ambulatory Visit: Payer: Self-pay | Admitting: Physician Assistant

## 2019-10-26 DIAGNOSIS — R2681 Unsteadiness on feet: Secondary | ICD-10-CM | POA: Insufficient documentation

## 2019-10-26 DIAGNOSIS — R296 Repeated falls: Secondary | ICD-10-CM | POA: Diagnosis present

## 2019-10-26 NOTE — Telephone Encounter (Signed)
Last Visit:09/18/2019 Next Visit:02/18/2020 Labs: 08/18/2019 CBC is normal. GFR is low but is stable. Alkaline phosphatase is better. TB gold: 10/31/2018 Neg   Current Dose per office note on 09/18/2019: Humira 40 mg sq injections every 14 day  OX:[id number] arthritis of multiple sites with negative rheumatoid factor   Patient advised she is due to update labs.   Okay to refill Humira?

## 2019-10-26 NOTE — Therapy (Signed)
[facility] [place], [place], [address] Phone: [phone]   Fax:  [phone]  Physical Therapy Evaluation  Patient Details  Name: [name] MRN: [id number] Date of Birth: 1945/05/06 Referring Provider (PT): Vivianne Spence, MD   Encounter Date: 10/26/2019   PT End of Session - 10/26/19 1204    Visit Number 1    Number of Visits 12    Date for PT Re-Evaluation 12/14/19    PT Start Time 1115    PT Stop Time 1158    PT Time Calculation (min) 43 min    Activity Tolerance Patient tolerated treatment well    Behavior During Therapy [facility] for tasks assessed/performed           Past Medical History:  Diagnosis Date  . Anemia   . Arthritis    Rheumatoid, followed by Dr. Estanislado Pandy, knees & ankles   . CHF (congestive heart failure) ([place])    pt not aware of this  . Constipation   . GERD (gastroesophageal reflux disease)   . H/O cardiovascular stress test 02/2014   seen by Dr. Einar Gip - told that she was cleared for surgery  . Heart murmur    states she's never had any problems  . Hypertension   . Leg cramps   . Shortness of breath dyspnea    with exertion  . Sleep apnea    CPAP in use- QO night, study done at [facility]- 2007   . Stroke [facility]) 03/2015   TIA    Past Surgical History:  Procedure Laterality Date  . ABDOMINAL HYSTERECTOMY    . CARPAL TUNNEL RELEASE Right   . COLONOSCOPY    . FOOT SURGERY Bilateral    bunionectomy, calluses , also has several pins & screws  . HAND SURGERY    . JOINT REPLACEMENT     planned for 03/25/2014  . KNEE ARTHROPLASTY    . KNEE SURGERY  1990's   . LUMBAR LAMINECTOMY/DECOMPRESSION MICRODISCECTOMY N/A 01/17/2015   Procedure: L2-3, L3-4 Decompression;  Surgeon: [name], MD;  Location: [place];  Service: Orthopedics;  Laterality: N/A;  . LUMBAR LAMINECTOMY/DECOMPRESSION MICRODISCECTOMY N/A 02/06/2015   Procedure: I AND D LUMBAR WITH WOUND VAC PLACEMENT;  Surgeon: [name], MD;   Location: [place];  Service: Orthopedics;  Laterality: N/A;  . ORIF TOE FRACTURE Right 07/30/2016   Procedure: OPEN REDUCTION INTERNAL FIXATION (ORIF) RIGHT 2ND METATARSAL (TOE) FRACTURE;  Surgeon: [name], MD;  Location: [place];  Service: Orthopedics;  Laterality: Right;  . TOTAL KNEE ARTHROPLASTY Left 03/25/2014   Procedure: TOTAL KNEE ARTHROPLASTY;  Surgeon: Garald Balding, MD;  Location: [place];  Service: Orthopedics;  Laterality: Left;  . TUBAL LIGATION      There were no vitals filed for this visit.    Subjective Assessment - 10/26/19 1203    Subjective COVID-19 screening performed upon arrival. Patient arrives to physical therapy with reports of decreased balance and a fear of falling. Patient reports balance got worse after lumbar surgery in 2017. Patient reported one fall indoors where she turned and tripped on her cane. Patient needed assistance to stand from family. Patient reports ambulating with cane for home and rollator for long distances. Patient has moved into son's home for safety. Patient's goals are to improve movement, improve strength, improve standing tolerance, and improve balance.    Pertinent History Arthrits, HTN, history of lumbar surgery and knee replacement    Limitations House hold activities  Currently in Pain? No/denies              [facility] PT Assessment - 10/26/19 0001      Assessment   Medical Diagnosis Abnormal gait    Referring Provider (PT) Vivianne Spence, MD    Onset Date/Surgical Date --   ongoing   Next MD Visit "3 to 6 months"    Prior Therapy no`      Precautions   Precautions Fall      Restrictions   Weight Bearing Restrictions No      Balance Screen   Has the patient fallen in the past 6 months Yes    How many times? 1    Has the patient had a decrease in activity level because of a fear of falling?  Yes    Is the patient reluctant to leave their home because of a fear of falling?  No      Home Environment   Living Environment  Private residence    Living Arrangements Children    Type of [place] to enter    Entrance Stairs-Number of Steps 1-2    Entrance Stairs-Rails Left    Additional Comments Lives with son and family      Prior Function   Level of Independence Independent with basic ADLs      Posture/Postural Control   Posture/Postural Control Postural limitations    Postural Limitations Anterior pelvic tilt;Flexed trunk;Rounded Shoulders;Forward head      ROM / Strength   AROM / PROM / Strength Strength      Strength   Overall Strength Deficits    Overall Strength Comments Hip abd and adduction in sitting    Strength Assessment Site Hip;Knee    Right/Left Hip Left;Right    Right Hip Flexion 4-/5    Right Hip ABduction 3+/5    Right Hip ADduction 4-/5    Left Hip Flexion 4-/5    Right/Left Knee Right;Left    Right Knee Flexion 4-/5    Right Knee Extension 4/5    Left Knee Flexion 4-/5    Left Knee Extension 4/5      Transfers   Transfers Independent with all Transfers      Ambulation/Gait   Assistive device Straight cane    Gait Pattern Step-through pattern;Decreased stride length;Trunk flexed;Wide base of support    Gait velocity decreased gait speed with eyes towards floor      Balance   Balance Assessed Yes      Standardized Balance Assessment   Standardized Balance Assessment Five Times Sit to Stand;Berg Balance Test    Five times sit to stand comments  17.5 seconds no UE support      Berg Balance Test   Sit to Stand Able to stand without using hands and stabilize independently    Standing Unsupported Able to stand safely 2 minutes    Sitting with Back Unsupported but Feet Supported on Floor or Stool Able to sit safely and securely 2 minutes    Stand to Sit Sits safely with minimal use of hands    Transfers Able to transfer safely, minor use of hands    Standing Unsupported with Eyes Closed Able to stand 10 seconds with supervision    Standing  Unsupported with Feet Together Able to place feet together independently and stand for 1 minute with supervision    From Standing, Reach Forward with Outstretched Arm Can reach forward >12 cm safely (5")  From Standing Position, Pick up Object from [place] to pick up shoe safely and easily    Berg comment: COMPLETE NEXT VISIT                      Objective measurements completed on examination: See above findings.               PT Education - 10/26/19 1351    Education Details seated: HR/TR, alternating marching, clamshells, LAQ, Sit to stand with one UE support    Person(s) Educated Patient    Methods Explanation;Demonstration;Handout    Comprehension Verbalized understanding;Returned demonstration               PT Long Term Goals - 10/26/19 1859      PT LONG TERM GOAL #1   Title Patient will be independent with HEP    Time 6    Period Weeks    Status New      PT LONG TERM GOAL #2   Title Patient will demonstrate 4+/5  or greater bilateral LE MMT to improve stability during functional tasks.    Time 6    Period Weeks    Status New      PT LONG TERM GOAL #3   Title Patient will demonstrate a 4+ point improvement on Berg Balance Scale to decrease risk of falls.    Time 6    Period Weeks    Status New      PT LONG TERM GOAL #4   Title Patient will perform 5x sit to stand test in 13 seconds or less with no UE support to improve balance and improve functional LE strength.    Time 6    Period Weeks    Status New      PT LONG TERM GOAL #5   Title ---                  Plan - 10/26/19 1354    Clinical Impression Statement Patient is a 74 year old female who presents to physical therapy with decreased balance, difficulty walking, and decreased bilateral LE MMT. Patient's 5x sit to stand time of 17.5 seconds categorizes her as a fall risk. Patient able to perform transfers independently with intermittent UE support and slow transitions.  Patient and PT discussed components of balance, POC, and HEP to which patient reported understanding. Patient would benefit from skilled physical therapy to address deficits and goals.    Personal Factors and Comorbidities Comorbidity 2;Time since onset of injury/illness/exacerbation    Comorbidities Arthritis, HTN    Examination-Activity Limitations Stairs;Stand;Locomotion Level    Examination-Participation Restrictions Cleaning    Stability/Clinical Decision Making Stable/Uncomplicated    Clinical Decision Making Low    Rehab Potential Good    PT Frequency 2x / week    PT Duration 6 weeks    PT Treatment/Interventions ADLs/Self Care Home Management;Gait training;Stair training;Functional mobility training;Therapeutic activities;Passive range of motion;Patient/family education;Therapeutic exercise;Balance training;Neuromuscular re-education;Manual techniques    PT Next Visit Plan Complete Berg please; Nustep, LE strengthening, balance activities as tolerated.    PT Home Exercise Plan see patient education section    Consulted and Agree with Plan of Care Patient           Patient will benefit from skilled therapeutic intervention in order to improve the following deficits and impairments:  Abnormal gait, Difficulty walking, Decreased balance, Decreased activity tolerance, Decreased strength, Decreased mobility  Visit Diagnosis: Unsteadiness on feet  Repeated falls  Problem List Patient Active Problem List   Diagnosis Date Noted  . Unilateral primary osteoarthritis, right knee 11/18/2018  . Closed fracture of shaft of metatarsal bone 07/30/2016  . Metatarsal fracture 07/30/2016  . Displaced fracture of second metatarsal bone, right foot, initial encounter for closed fracture 07/26/2016  . Arthritis of shoulder 04/10/2016  . High risk medication use 03/16/2016  . Primary osteoarthritis of both feet 03/16/2016  . Primary osteoarthritis of both knees 03/16/2016  . DDD  (degenerative disc disease), lumbar 03/16/2016  . OSA on CPAP 07/27/2015  . Lip numbness 03/19/2015  . TIA (transient ischemic attack) 03/19/2015  . 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 Post op infection 02/05/2015  . History of lumbar laminectomy for spinal cord decompression 01/17/2015  . Lumbar spinal stenosis 01/17/2015  . Primary osteoarthritis of left knee 03/25/2014  . Rheumatoid arthritis of multiple sites with negative rheumatoid factor ([place]) 03/25/2014  . History of total knee replacement, left 03/25/2014  . DERMATITIS 09/28/2008  . CONSTIPATION 05/25/2008  . DYSPNEA ON EXERTION 05/25/2008  . ANEMIA, NORMOCYTIC 04/13/2008  . ANKLE PAIN, BILATERAL 11/25/2007  . HLD (hyperlipidemia) 08/14/2006  . Morbid (severe) obesity due to excess calories ([place]) 12/13/2005  . CARPAL TUNNEL SYNDROME 12/13/2005  . PERIPHERAL NEUROPATHY 12/13/2005  . Essential hypertension 12/13/2005  . DYSRHYTHMIA, CARDIAC NOS 12/13/2005    Gabriela Eves, PT, DPT 10/26/2019, 7:14 PM  [facility] [address] [place], [place], [address] Phone: [phone]   Fax:  [phone]  Name: [name] MRN: [id number] Date of Birth: 01-19-1945

## 2019-10-28 ENCOUNTER — Ambulatory Visit: Payer: Medicare PPO | Admitting: Physical Therapy

## 2019-10-28 ENCOUNTER — Other Ambulatory Visit: Payer: Self-pay

## 2019-10-28 ENCOUNTER — Encounter: Payer: Self-pay | Admitting: Physical Therapy

## 2019-10-28 DIAGNOSIS — R2681 Unsteadiness on feet: Secondary | ICD-10-CM | POA: Diagnosis not present

## 2019-10-28 DIAGNOSIS — R296 Repeated falls: Secondary | ICD-10-CM

## 2019-10-28 NOTE — Therapy (Signed)
[facility] [place], [place], [address] Phone: [phone]   Fax:  [phone]  Physical Therapy Treatment  Patient Details  Name: [name] MRN: [id number] Date of Birth: 01/12/1945 Referring Provider (PT): Vivianne Spence, MD   Encounter Date: 10/28/2019   PT End of Session - 10/28/19 0748    Visit Number 2    Number of Visits 12    Date for PT Re-Evaluation 12/14/19    PT Start Time 0732    PT Stop Time 0815    PT Time Calculation (min) 43 min    Equipment Utilized During Treatment Other (comment)   SPC   Activity Tolerance Patient tolerated treatment well    Behavior During Therapy [facility] for tasks assessed/performed           Past Medical History:  Diagnosis Date  . Anemia   . Arthritis    Rheumatoid, followed by Dr. Estanislado Pandy, knees & ankles   . CHF (congestive heart failure) ([place])    pt not aware of this  . Constipation   . GERD (gastroesophageal reflux disease)   . H/O cardiovascular stress test 02/2014   seen by Dr. Einar Gip - told that she was cleared for surgery  . Heart murmur    states she's never had any problems  . Hypertension   . Leg cramps   . Shortness of breath dyspnea    with exertion  . Sleep apnea    CPAP in use- QO night, study done at [facility]- 2007   . Stroke [facility]) 03/2015   TIA    Past Surgical History:  Procedure Laterality Date  . ABDOMINAL HYSTERECTOMY    . CARPAL TUNNEL RELEASE Right   . COLONOSCOPY    . FOOT SURGERY Bilateral    bunionectomy, calluses , also has several pins & screws  . HAND SURGERY    . JOINT REPLACEMENT     planned for 03/25/2014  . KNEE ARTHROPLASTY    . KNEE SURGERY  1990's   . LUMBAR LAMINECTOMY/DECOMPRESSION MICRODISCECTOMY N/A 01/17/2015   Procedure: L2-3, L3-4 Decompression;  Surgeon: [name], MD;  Location: [place];  Service: Orthopedics;  Laterality: N/A;  . LUMBAR LAMINECTOMY/DECOMPRESSION MICRODISCECTOMY N/A 02/06/2015   Procedure: I AND D  LUMBAR WITH WOUND VAC PLACEMENT;  Surgeon: [name], MD;  Location: [place];  Service: Orthopedics;  Laterality: N/A;  . ORIF TOE FRACTURE Right 07/30/2016   Procedure: OPEN REDUCTION INTERNAL FIXATION (ORIF) RIGHT 2ND METATARSAL (TOE) FRACTURE;  Surgeon: [name], MD;  Location: [place];  Service: Orthopedics;  Laterality: Right;  . TOTAL KNEE ARTHROPLASTY Left 03/25/2014   Procedure: TOTAL KNEE ARTHROPLASTY;  Surgeon: Garald Balding, MD;  Location: [place];  Service: Orthopedics;  Laterality: Left;  . TUBAL LIGATION      There were no vitals filed for this visit.   Subjective Assessment - 10/28/19 0742    Subjective COVID 19 screening performed on patient upon arrival. Reports grwatest limitation being her toes will hit the ground or hit furniture and trip her up.    Pertinent History Arthrits, HTN, history of lumbar surgery and knee replacement    Limitations House hold activities    Currently in Pain? No/denies              [facility] PT Assessment - 10/28/19 0001      Assessment   Medical Diagnosis Abnormal gait    Referring Provider (PT) Vivianne Spence, MD  Next MD Visit "3 to 6 months"    Prior Therapy no`      Precautions   Precautions Fall      Restrictions   Weight Bearing Restrictions No                         [facility] Adult PT Treatment/Exercise - 10/28/19 0001      Exercises   Exercises Knee/Hip      Knee/Hip Exercises: Aerobic   Nustep L4 x17 min      Knee/Hip Exercises: Standing   Heel Raises Both;20 reps    Heel Raises Limitations B toe raise x20 reps    Hip Flexion AROM;Both;20 reps;Knee bent    Hip Flexion Limitations alternating    Hip Abduction AROM;Both;20 reps;Knee straight    Other Standing Knee Exercises DLS on airex x5 min with no to min UE support      Knee/Hip Exercises: Seated   Long Arc Quad Strengthening;Both;20 reps;Weights    Long Arc Quad Weight 4 lbs.    Clamshell with [employer] Green   x20 reps   Sit to [place] 15  reps;without UE support                       PT Long Term Goals - 10/26/19 1859      PT LONG TERM GOAL #1   Title Patient will be independent with HEP    Time 6    Period Weeks    Status New      PT LONG TERM GOAL #2   Title Patient will demonstrate 4+/5  or greater bilateral LE MMT to improve stability during functional tasks.    Time 6    Period Weeks    Status New      PT LONG TERM GOAL #3   Title Patient will demonstrate a 4+ point improvement on Berg Balance Scale to decrease risk of falls.    Time 6    Period Weeks    Status New      PT LONG TERM GOAL #4   Title Patient will perform 5x sit to stand test in 13 seconds or less with no UE support to improve balance and improve functional LE strength.    Time 6    Period Weeks    Status New      PT LONG TERM GOAL #5   Title ---                 Plan - 10/28/19 5462    Clinical Impression Statement Patient presented in clinic with reports of no recent falls but does report LE weakness she has noticed. Patient also reported that with RA her great toe is deviated medially and toes are flexing more. Patient progressed through BLE strengthening with minimal UE support if possible. Patient reports partial HEP compliance. Enhanced ankle strategy noted on airex with little to no UE support.    Personal Factors and Comorbidities Comorbidity 2;Time since onset of injury/illness/exacerbation    Comorbidities Arthrits, HTN, history of lumbar surgery and knee replacement    Examination-Activity Limitations Stairs;Stand;Locomotion Level    Examination-Participation Restrictions Cleaning    Stability/Clinical Decision Making Stable/Uncomplicated    Rehab Potential Good    PT Frequency 2x / week    PT Duration 6 weeks    PT Treatment/Interventions ADLs/Self Care Home Management;Gait training;Stair training;Functional mobility training;Therapeutic activities;Passive range of motion;Patient/family  education;Therapeutic exercise;Balance training;Neuromuscular re-education;Manual techniques  PT Next Visit Plan Complete Berg please; Nustep, LE strengthening, balance activities as tolerated.    PT Home Exercise Plan see patient education section    Consulted and Agree with Plan of Care Patient           Patient will benefit from skilled therapeutic intervention in order to improve the following deficits and impairments:  Abnormal gait, Difficulty walking, Decreased balance, Decreased activity tolerance, Decreased strength, Decreased mobility  Visit Diagnosis: Unsteadiness on feet  Repeated falls     Problem List Patient Active Problem List   Diagnosis Date Noted  . Unilateral primary osteoarthritis, right knee 11/18/2018  . Closed fracture of shaft of metatarsal bone 07/30/2016  . Metatarsal fracture 07/30/2016  . Displaced fracture of second metatarsal bone, right foot, initial encounter for closed fracture 07/26/2016  . Arthritis of shoulder 04/10/2016  . High risk medication use 03/16/2016  . Primary osteoarthritis of both feet 03/16/2016  . Primary osteoarthritis of both knees 03/16/2016  . DDD (degenerative disc disease), lumbar 03/16/2016  . OSA on CPAP 07/27/2015  . Lip numbness 03/19/2015  . TIA (transient ischemic attack) 03/19/2015  . 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[place]) 02/06/2015  . Post op infection 02/05/2015  . History of lumbar laminectomy for spinal cord decompression 01/17/2015  . Lumbar spinal stenosis 01/17/2015  . Primary osteoarthritis of left knee 03/25/2014  . Rheumatoid arthritis of multiple sites with negative rheumatoid factor ([place]) 03/25/2014  . History of total knee replacement, left 03/25/2014  . DERMATITIS 09/28/2008  . CONSTIPATION 05/25/2008  . DYSPNEA ON EXERTION 05/25/2008  . ANEMIA, NORMOCYTIC 04/13/2008  . ANKLE PAIN, BILATERAL 11/25/2007  . HLD (hyperlipidemia) 08/14/2006  . Morbid (severe) obesity due to excess calories ([place])  12/13/2005  . CARPAL TUNNEL SYNDROME 12/13/2005  . PERIPHERAL NEUROPATHY 12/13/2005  . Essential hypertension 12/13/2005  . DYSRHYTHMIA, CARDIAC NOS 12/13/2005    Standley Brooking, PTA 10/28/2019, 8:28 AM  [facility] [address] [place], [place], [address] Phone: [phone]   Fax:  [phone]  Name: BIANCE MONCRIEF MRN: [id number] Date of Birth: 03/12/1945

## 2019-10-29 ENCOUNTER — Other Ambulatory Visit: Payer: Self-pay | Admitting: Gastroenterology

## 2019-11-02 ENCOUNTER — Ambulatory Visit: Payer: Medicare PPO | Admitting: Physical Therapy

## 2019-11-02 ENCOUNTER — Other Ambulatory Visit: Payer: Self-pay

## 2019-11-02 DIAGNOSIS — R2681 Unsteadiness on feet: Secondary | ICD-10-CM

## 2019-11-02 DIAGNOSIS — R296 Repeated falls: Secondary | ICD-10-CM

## 2019-11-02 MED FILL — HUMIRA PEN 40 MG/0.4ML PNKT: 40 | 28 days supply | Qty: 2 | Fill #0

## 2019-11-02 NOTE — Therapy (Signed)
[facility] [place], [place], [address] Phone: [phone]   Fax:  [phone]  Physical Therapy Treatment  Patient Details  Name: [name] MRN: [id number] Date of Birth: 11-16-1945 Referring Provider (PT): Vivianne Spence, MD   Encounter Date: 11/02/2019   PT End of Session - 11/02/19 0828    Visit Number 3    Number of Visits 12    Date for PT Re-Evaluation 12/14/19    PT Start Time 0815    PT Stop Time 0858    PT Time Calculation (min) 43 min    Activity Tolerance Patient tolerated treatment well    Behavior During Therapy [facility] for tasks assessed/performed           Past Medical History:  Diagnosis Date  . Anemia   . Arthritis    Rheumatoid, followed by Dr. Estanislado Pandy, knees & ankles   . CHF (congestive heart failure) ([place])    pt not aware of this  . Constipation   . GERD (gastroesophageal reflux disease)   . H/O cardiovascular stress test 02/2014   seen by Dr. Einar Gip - told that she was cleared for surgery  . Heart murmur    states she's never had any problems  . Hypertension   . Leg cramps   . Shortness of breath dyspnea    with exertion  . Sleep apnea    CPAP in use- QO night, study done at [facility]- 2007   . Stroke [facility]) 03/2015   TIA    Past Surgical History:  Procedure Laterality Date  . ABDOMINAL HYSTERECTOMY    . CARPAL TUNNEL RELEASE Right   . COLONOSCOPY    . FOOT SURGERY Bilateral    bunionectomy, calluses , also has several pins & screws  . HAND SURGERY    . JOINT REPLACEMENT     planned for 03/25/2014  . KNEE ARTHROPLASTY    . KNEE SURGERY  1990's   . LUMBAR LAMINECTOMY/DECOMPRESSION MICRODISCECTOMY N/A 01/17/2015   Procedure: L2-3, L3-4 Decompression;  Surgeon: [name], MD;  Location: [place];  Service: Orthopedics;  Laterality: N/A;  . LUMBAR LAMINECTOMY/DECOMPRESSION MICRODISCECTOMY N/A 02/06/2015   Procedure: I AND D LUMBAR WITH WOUND VAC PLACEMENT;  Surgeon: [name], MD;   Location: [place];  Service: Orthopedics;  Laterality: N/A;  . ORIF TOE FRACTURE Right 07/30/2016   Procedure: OPEN REDUCTION INTERNAL FIXATION (ORIF) RIGHT 2ND METATARSAL (TOE) FRACTURE;  Surgeon: [name], MD;  Location: [place];  Service: Orthopedics;  Laterality: Right;  . TOTAL KNEE ARTHROPLASTY Left 03/25/2014   Procedure: TOTAL KNEE ARTHROPLASTY;  Surgeon: Garald Balding, MD;  Location: [place];  Service: Orthopedics;  Laterality: Left;  . TUBAL LIGATION      There were no vitals filed for this visit.   Subjective Assessment - 11/02/19 0818    Subjective COVID 19 screening performed on patient upon arrival. Patient arrived with some soreness from last treatment yet overall doing well per reported    Pertinent History Arthrits, HTN, history of lumbar surgery and knee replacement    Limitations House hold activities    Currently in Pain? No/denies              [facility] PT Assessment - 11/02/19 0001      Berg Balance Test   Sit to Stand Able to stand without using hands and stabilize independently    Standing Unsupported Able to stand safely 2 minutes    Sitting with  Back Unsupported but Feet Supported on Floor or Stool Able to sit safely and securely 2 minutes    Stand to Sit Sits safely with minimal use of hands    Transfers Able to transfer safely, minor use of hands    Standing Unsupported with Eyes Closed Able to stand 10 seconds with supervision    Standing Unsupported with Feet Together Able to place feet together independently and stand for 1 minute with supervision    From Standing, Reach Forward with Outstretched Arm Can reach forward >12 cm safely (5")    From Standing Position, Pick up Object from [place] to pick up shoe safely and easily    From Standing Position, Turn to Look Behind Over each Shoulder Turn sideways only but maintains balance    Turn 360 Degrees Able to turn 360 degrees safely but slowly    Standing Unsupported, Alternately Place Feet on Step/Stool  Able to complete >2 steps/needs minimal assist    Standing Unsupported, One Foot in Front Needs help to step but can hold 15 seconds    Standing on One Leg Unable to try or needs assist to prevent fall    Total Score 39                         [facility] Adult PT Treatment/Exercise - 11/02/19 0001      Knee/Hip Exercises: Aerobic   Nustep L4 x44min UE/LE activity      Knee/Hip Exercises: Standing   Heel Raises Both;20 reps    Heel Raises Limitations B toe raise x20 reps    Hip Flexion AROM;Both;20 reps;Knee bent    Hip Flexion Limitations alternating    Hip Abduction AROM;Both;20 reps;Knee straight    Other Standing Knee Exercises standing core sets 5seconds x20reps using red swiss ball push downs on mat table      Knee/Hip Exercises: Seated   Long Arc Quad Strengthening;Both;20 reps;Weights    Long Arc Quad Weight 4 lbs.    Clamshell with TheraBand Green   x20   Hamstring Curl Strengthening;Both;15 reps   green tband   Sit to Sand 10 reps;without UE support                       PT Long Term Goals - 11/02/19 0819      PT LONG TERM GOAL #1   Title Patient will be independent with HEP    Time 6    Period Weeks    Status On-going      PT LONG TERM GOAL #2   Title Patient will demonstrate 4+/5  or greater bilateral LE MMT to improve stability during functional tasks.    Time 6    Period Weeks    Status On-going      PT LONG TERM GOAL #3   Title Patient will demonstrate a 4+ point improvement on Berg Balance Scale to decrease risk of falls.    Time 6    Period Weeks    Status On-going      PT LONG TERM GOAL #4   Title Patient will perform 5x sit to stand test in 13 seconds or less with no UE support to improve balance and improve functional LE strength.    Baseline 4x 13 seconds 11/02/19    Time 6    Period Weeks    Status On-going  Plan - 11/02/19 0849    Clinical Impression Statement Patient tolerated treatment well  today. Patient able to progress with all exercises today and educated patient on core activation technique to incorporate into all activities. Patient able to perorm sit to stand 4 reps in 13 seconds. BERG 39/56 today. Patient doing initial HEP as directed by PT. Goals progressing at this time.    Personal Factors and Comorbidities Comorbidity 2;Time since onset of injury/illness/exacerbation    Comorbidities Arthrits, HTN, history of lumbar surgery and knee replacement    Examination-Activity Limitations Stairs;Stand;Locomotion Level    Examination-Participation Restrictions Cleaning    Stability/Clinical Decision Making Stable/Uncomplicated    Rehab Potential Good    PT Frequency 2x / week    PT Duration 6 weeks    PT Treatment/Interventions ADLs/Self Care Home Management;Gait training;Stair training;Functional mobility training;Therapeutic activities;Passive range of motion;Patient/family education;Therapeutic exercise;Balance training;Neuromuscular re-education;Manual techniques    PT Next Visit Plan cont with POC LE strengthening, balance activities as tolerated.    Consulted and Agree with Plan of Care Patient           Patient will benefit from skilled therapeutic intervention in order to improve the following deficits and impairments:  Abnormal gait, Difficulty walking, Decreased balance, Decreased activity tolerance, Decreased strength, Decreased mobility  Visit Diagnosis: Unsteadiness on feet  Repeated falls     Problem List Patient Active Problem List   Diagnosis Date Noted  . Unilateral primary osteoarthritis, right knee 11/18/2018  . Closed fracture of shaft of metatarsal bone 07/30/2016  . Metatarsal fracture 07/30/2016  . Displaced fracture of second metatarsal bone, right foot, initial encounter for closed fracture 07/26/2016  . Arthritis of shoulder 04/10/2016  . High risk medication use 03/16/2016  . Primary osteoarthritis of both feet 03/16/2016  . Primary  osteoarthritis of both knees 03/16/2016  . DDD (degenerative disc disease), lumbar 03/16/2016  . OSA on CPAP 07/27/2015  . Lip numbness 03/19/2015  . TIA (transient ischemic attack) 03/19/2015  . 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 Post op infection 02/05/2015  . History of lumbar laminectomy for spinal cord decompression 01/17/2015  . Lumbar spinal stenosis 01/17/2015  . Primary osteoarthritis of left knee 03/25/2014  . Rheumatoid arthritis of multiple sites with negative rheumatoid factor ([place]) 03/25/2014  . History of total knee replacement, left 03/25/2014  . DERMATITIS 09/28/2008  . CONSTIPATION 05/25/2008  . DYSPNEA ON EXERTION 05/25/2008  . ANEMIA, NORMOCYTIC 04/13/2008  . ANKLE PAIN, BILATERAL 11/25/2007  . HLD (hyperlipidemia) 08/14/2006  . Morbid (severe) obesity due to excess calories ([place]) 12/13/2005  . CARPAL TUNNEL SYNDROME 12/13/2005  . PERIPHERAL NEUROPATHY 12/13/2005  . Essential hypertension 12/13/2005  . DYSRHYTHMIA, CARDIAC NOS 12/13/2005    Phillips Climes, PTA 11/02/2019, 9:04 AM  [facility] [address] [place], [place], [address] Phone: [phone]   Fax:  [phone]  Name: PRECIOUS SEGALL MRN: [id number] Date of Birth: [birth year]

## 2019-11-04 ENCOUNTER — Other Ambulatory Visit: Payer: Self-pay

## 2019-11-04 ENCOUNTER — Ambulatory Visit: Payer: Medicare PPO | Admitting: Physical Therapy

## 2019-11-04 DIAGNOSIS — R2681 Unsteadiness on feet: Secondary | ICD-10-CM

## 2019-11-04 DIAGNOSIS — R296 Repeated falls: Secondary | ICD-10-CM

## 2019-11-04 NOTE — Therapy (Signed)
[facility] [place], [place], [address] Phone: [phone]   Fax:  [phone]  Physical Therapy Treatment  Patient Details  Name: [name] MRN: [id number] Date of Birth: [birth year] Referring Provider (PT): Vivianne Spence, MD   Encounter Date: 11/04/2019   PT End of Session - 11/04/19 0948    Visit Number 4    Number of Visits 12    Date for PT Re-Evaluation 12/14/19    PT Start Time 0900    PT Stop Time 0936    PT Time Calculation (min) 36 min    Activity Tolerance Patient limited by fatigue    Behavior During Therapy [facility] for tasks assessed/performed           Past Medical History:  Diagnosis Date  . Anemia   . Arthritis    Rheumatoid, followed by Dr. Estanislado Pandy, knees & ankles   . CHF (congestive heart failure) ([place])    pt not aware of this  . Constipation   . GERD (gastroesophageal reflux disease)   . H/O cardiovascular stress test 02/2014   seen by Dr. Einar Gip - told that she was cleared for surgery  . Heart murmur    states she's never had any problems  . Hypertension   . Leg cramps   . Shortness of breath dyspnea    with exertion  . Sleep apnea    CPAP in use- QO night, study done at [facility]- 2007   . Stroke [facility]) 03/2015   TIA    Past Surgical History:  Procedure Laterality Date  . ABDOMINAL HYSTERECTOMY    . CARPAL TUNNEL RELEASE Right   . COLONOSCOPY    . FOOT SURGERY Bilateral    bunionectomy, calluses , also has several pins & screws  . HAND SURGERY    . JOINT REPLACEMENT     planned for 03/25/2014  . KNEE ARTHROPLASTY    . KNEE SURGERY  1990's   . LUMBAR LAMINECTOMY/DECOMPRESSION MICRODISCECTOMY N/A 01/17/2015   Procedure: L2-3, L3-4 Decompression;  Surgeon: [name], MD;  Location: [place];  Service: Orthopedics;  Laterality: N/A;  . LUMBAR LAMINECTOMY/DECOMPRESSION MICRODISCECTOMY N/A 02/06/2015   Procedure: I AND D LUMBAR WITH WOUND VAC PLACEMENT;  Surgeon: [name], MD;   Location: [place];  Service: Orthopedics;  Laterality: N/A;  . ORIF TOE FRACTURE Right 07/30/2016   Procedure: OPEN REDUCTION INTERNAL FIXATION (ORIF) RIGHT 2ND METATARSAL (TOE) FRACTURE;  Surgeon: [name], MD;  Location: [place];  Service: Orthopedics;  Laterality: Right;  . TOTAL KNEE ARTHROPLASTY Left 03/25/2014   Procedure: TOTAL KNEE ARTHROPLASTY;  Surgeon: Garald Balding, MD;  Location: [place];  Service: Orthopedics;  Laterality: Left;  . TUBAL LIGATION      There were no vitals filed for this visit.   Subjective Assessment - 11/04/19 0944    Subjective COVID-19 screen performed prior to patient entering clinic.  No new complaints.    Pertinent History Arthrits, HTN, history of lumbar surgery and knee replacement    Limitations House hold activities    Currently in Pain? No/denies                             [facility] Adult PT Treatment/Exercise - 11/04/19 0001      Exercises   Exercises Knee/Hip      Knee/Hip Exercises: Aerobic   Nustep Level 4 x 17 minutes.      Knee/Hip  Exercises: Machines for Strengthening   Cybex Knee Extension 10# x 3 minutes limited and pain-free ROM.    Cybex Knee Flexion 30# x 3 minutes.      Knee/Hip Exercises: Standing   Other Standing Knee Exercises Rockerboard in parall bars x 5 minutes.    Other Standing Knee Exercises Green XTS forward walking x 2 minutes.                       PT Long Term Goals - 11/02/19 0819      PT LONG TERM GOAL #1   Title Patient will be independent with HEP    Time 6    Period Weeks    Status On-going      PT LONG TERM GOAL #2   Title Patient will demonstrate 4+/5  or greater bilateral LE MMT to improve stability during functional tasks.    Time 6    Period Weeks    Status On-going      PT LONG TERM GOAL #3   Title Patient will demonstrate a 4+ point improvement on Berg Balance Scale to decrease risk of falls.    Time 6    Period Weeks    Status On-going      PT LONG  TERM GOAL #4   Title Patient will perform 5x sit to stand test in 13 seconds or less with no UE support to improve balance and improve functional LE strength.    Baseline 4x 13 seconds 11/02/19    Time 6    Period Weeks    Status On-going                 Plan - 11/04/19 0949    Clinical Impression Statement Patient did well today with advancing treatment.  Limited somewhat due to fatigue and her right hamstring gets tired.  She states she needs a TKA on the right.    Personal Factors and Comorbidities Comorbidity 2;Time since onset of injury/illness/exacerbation    Comorbidities Arthrits, HTN, history of lumbar surgery and knee replacement    Examination-Activity Limitations Stairs;Stand;Locomotion Level    Examination-Participation Restrictions Cleaning    Stability/Clinical Decision Making Stable/Uncomplicated    Rehab Potential Good    PT Frequency 2x / week    PT Duration 6 weeks    PT Treatment/Interventions ADLs/Self Care Home Management;Gait training;Stair training;Functional mobility training;Therapeutic activities;Passive range of motion;Patient/family education;Therapeutic exercise;Balance training;Neuromuscular re-education;Manual techniques    PT Next Visit Plan cont with POC LE strengthening, balance activities as tolerated.    PT Home Exercise Plan see patient education section    Consulted and Agree with Plan of Care Patient           Patient will benefit from skilled therapeutic intervention in order to improve the following deficits and impairments:  Abnormal gait, Difficulty walking, Decreased balance, Decreased activity tolerance, Decreased strength, Decreased mobility  Visit Diagnosis: Unsteadiness on feet  Repeated falls     Problem List Patient Active Problem List   Diagnosis Date Noted  . Unilateral primary osteoarthritis, right knee 11/18/2018  . Closed fracture of shaft of metatarsal bone 07/30/2016  . Metatarsal fracture 07/30/2016  .  Displaced fracture of second metatarsal bone, right foot, initial encounter for closed fracture 07/26/2016  . Arthritis of shoulder 04/10/2016  . High risk medication use 03/16/2016  . Primary osteoarthritis of both feet 03/16/2016  . Primary osteoarthritis of both knees 03/16/2016  . DDD (degenerative disc disease), lumbar 03/16/2016  .  OSA on CPAP 07/27/2015  . Lip numbness 03/19/2015  . TIA (transient ischemic attack) 03/19/2015  . 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[place]) 02/06/2015  . Post op infection 02/05/2015  . History of lumbar laminectomy for spinal cord decompression 01/17/2015  . Lumbar spinal stenosis 01/17/2015  . Primary osteoarthritis of left knee 03/25/2014  . Rheumatoid arthritis of multiple sites with negative rheumatoid factor ([place]) 03/25/2014  . History of total knee replacement, left 03/25/2014  . DERMATITIS 09/28/2008  . CONSTIPATION 05/25/2008  . DYSPNEA ON EXERTION 05/25/2008  . ANEMIA, NORMOCYTIC 04/13/2008  . ANKLE PAIN, BILATERAL 11/25/2007  . HLD (hyperlipidemia) 08/14/2006  . Morbid (severe) obesity due to excess calories ([place]) 12/13/2005  . CARPAL TUNNEL SYNDROME 12/13/2005  . PERIPHERAL NEUROPATHY 12/13/2005  . Essential hypertension 12/13/2005  . DYSRHYTHMIA, CARDIAC NOS 12/13/2005    Tanganika Barradas, [place] MPT 11/04/2019, 9:51 AM  [facility] [address] [place], [place], [address] Phone: [phone]   Fax:  [phone]  Name: [name] MRN: [id number] Date of Birth: [birth year]

## 2019-11-07 ENCOUNTER — Other Ambulatory Visit: Payer: Self-pay | Admitting: Rheumatology

## 2019-11-09 ENCOUNTER — Other Ambulatory Visit: Payer: Self-pay

## 2019-11-09 ENCOUNTER — Encounter: Payer: Self-pay | Admitting: Physical Therapy

## 2019-11-09 ENCOUNTER — Ambulatory Visit: Payer: Medicare PPO | Attending: Family Medicine | Admitting: Physical Therapy

## 2019-11-09 DIAGNOSIS — R296 Repeated falls: Secondary | ICD-10-CM | POA: Insufficient documentation

## 2019-11-09 DIAGNOSIS — R2681 Unsteadiness on feet: Secondary | ICD-10-CM | POA: Insufficient documentation

## 2019-11-09 NOTE — Therapy (Signed)
[facility] [place], [place], [address] Phone: [phone]   Fax:  [phone]  Physical Therapy Treatment  Patient Details  Name: [name] MRN: [id number] Date of Birth: [birth year] Referring Provider (PT): Vivianne Spence, MD   Encounter Date: 11/09/2019   PT End of Session - 11/09/19 0901    Visit Number 5    Number of Visits 12    Date for PT Re-Evaluation 12/14/19    PT Start Time 0859    PT Stop Time 0943    PT Time Calculation (min) 44 min    Activity Tolerance Patient limited by fatigue    Behavior During Therapy [facility] for tasks assessed/performed           Past Medical History:  Diagnosis Date  . Anemia   . Arthritis    Rheumatoid, followed by Dr. Estanislado Pandy, knees & ankles   . CHF (congestive heart failure) ([place])    pt not aware of this  . Constipation   . GERD (gastroesophageal reflux disease)   . H/O cardiovascular stress test 02/2014   seen by Dr. Einar Gip - told that she was cleared for surgery  . Heart murmur    states she's never had any problems  . Hypertension   . Leg cramps   . Shortness of breath dyspnea    with exertion  . Sleep apnea    CPAP in use- QO night, study done at [facility]- 2007   . Stroke [facility]) 03/2015   TIA    Past Surgical History:  Procedure Laterality Date  . ABDOMINAL HYSTERECTOMY    . CARPAL TUNNEL RELEASE Right   . COLONOSCOPY    . FOOT SURGERY Bilateral    bunionectomy, calluses , also has several pins & screws  . HAND SURGERY    . JOINT REPLACEMENT     planned for 03/25/2014  . KNEE ARTHROPLASTY    . KNEE SURGERY  1990's   . LUMBAR LAMINECTOMY/DECOMPRESSION MICRODISCECTOMY N/A 01/17/2015   Procedure: L2-3, L3-4 Decompression;  Surgeon: [name], MD;  Location: [place];  Service: Orthopedics;  Laterality: N/A;  . LUMBAR LAMINECTOMY/DECOMPRESSION MICRODISCECTOMY N/A 02/06/2015   Procedure: I AND D LUMBAR WITH WOUND VAC PLACEMENT;  Surgeon: [name], MD;   Location: [place];  Service: Orthopedics;  Laterality: N/A;  . ORIF TOE FRACTURE Right 07/30/2016   Procedure: OPEN REDUCTION INTERNAL FIXATION (ORIF) RIGHT 2ND METATARSAL (TOE) FRACTURE;  Surgeon: [name], MD;  Location: [place];  Service: Orthopedics;  Laterality: Right;  . TOTAL KNEE ARTHROPLASTY Left 03/25/2014   Procedure: TOTAL KNEE ARTHROPLASTY;  Surgeon: Garald Balding, MD;  Location: [place];  Service: Orthopedics;  Laterality: Left;  . TUBAL LIGATION      There were no vitals filed for this visit.   Subjective Assessment - 11/09/19 0900    Subjective COVID-19 screen performed prior to patient entering clinic.  No new complaints.    Pertinent History Arthrits, HTN, history of lumbar surgery and knee replacement    Limitations House hold activities    Currently in Pain? No/denies              [facility] PT Assessment - 11/09/19 0001      Assessment   Medical Diagnosis Abnormal gait    Referring Provider (PT) Vivianne Spence, MD    Next MD Visit "3 to 6 months"    Prior Therapy no`      Precautions   Precautions Fall  Restrictions   Weight Bearing Restrictions No                         [facility] Adult PT Treatment/Exercise - 11/09/19 0001      Knee/Hip Exercises: Aerobic   Nustep Level 4 x 18 minutes.      Knee/Hip Exercises: Machines for Strengthening   Cybex Knee Extension 10# 3x10 reps    Cybex Knee Flexion 30# 3x10 reps      Knee/Hip Exercises: Standing   Heel Raises Both;20 reps    Heel Raises Limitations B toe raise x20 reps    Hip Flexion AROM;Both;15 reps;Knee bent   with pause for SLS   Hip Abduction AROM;Both;2 sets;10 reps;Knee straight    Walking with Sports Cord Green XTS forward, B side stepping x10 reps each      Knee/Hip Exercises: Seated   Other Seated Knee/Hip Exercises Core trunk rotation 4# ball x10 reps each    Sit to Sand 20 reps;without UE support               Balance Exercises - 11/09/19 0001      Balance  Exercises: Standing   Tandem Gait Forward;Intermittent upper extremity support;Foam/compliant surface;2 reps    Sidestepping Foam/compliant support;Upper extremity support;2 reps                  PT Long Term Goals - 11/02/19 0819      PT LONG TERM GOAL #1   Title Patient will be independent with HEP    Time 6    Period Weeks    Status On-going      PT LONG TERM GOAL #2   Title Patient will demonstrate 4+/5  or greater bilateral LE MMT to improve stability during functional tasks.    Time 6    Period Weeks    Status On-going      PT LONG TERM GOAL #3   Title Patient will demonstrate a 4+ point improvement on Berg Balance Scale to decrease risk of falls.    Time 6    Period Weeks    Status On-going      PT LONG TERM GOAL #4   Title Patient will perform 5x sit to stand test in 13 seconds or less with no UE support to improve balance and improve functional LE strength.    Baseline 4x 13 seconds 11/02/19    Time 6    Period Weeks    Status On-going                 Plan - 11/09/19 1047    Clinical Impression Statement Patient tolerated today's treatment well although fatigued and required intermittant rest breaks. Patient able to tolerate the various strengthening exercises well. Patient noted that she has seen improvement in getting into and out of the shower and feels more stable. Patient was very challenged by airex beam activities and required intermittant UE support.    Personal Factors and Comorbidities Comorbidity 2;Time since onset of injury/illness/exacerbation    Comorbidities Arthrits, HTN, history of lumbar surgery and knee replacement    Examination-Activity Limitations Stairs;Stand;Locomotion Level    Examination-Participation Restrictions Cleaning    Stability/Clinical Decision Making Stable/Uncomplicated    Rehab Potential Good    PT Frequency 2x / week    PT Duration 6 weeks    PT Treatment/Interventions ADLs/Self Care Home Management;Gait  training;Stair training;Functional mobility training;Therapeutic activities;Passive range of motion;Patient/family education;Therapeutic exercise;Balance training;Neuromuscular re-education;Manual techniques  PT Next Visit Plan cont with POC LE strengthening, balance activities as tolerated.    PT Home Exercise Plan see patient education section    Consulted and Agree with Plan of Care Patient           Patient will benefit from skilled therapeutic intervention in order to improve the following deficits and impairments:  Abnormal gait, Difficulty walking, Decreased balance, Decreased activity tolerance, Decreased strength, Decreased mobility  Visit Diagnosis: Unsteadiness on feet  Repeated falls     Problem List Patient Active Problem List   Diagnosis Date Noted  . Unilateral primary osteoarthritis, right knee 11/18/2018  . Closed fracture of shaft of metatarsal bone 07/30/2016  . Metatarsal fracture 07/30/2016  . Displaced fracture of second metatarsal bone, right foot, initial encounter for closed fracture 07/26/2016  . Arthritis of shoulder 04/10/2016  . High risk medication use 03/16/2016  . Primary osteoarthritis of both feet 03/16/2016  . Primary osteoarthritis of both knees 03/16/2016  . DDD (degenerative disc disease), lumbar 03/16/2016  . OSA on CPAP 07/27/2015  . Lip numbness 03/19/2015  . TIA (transient ischemic attack) 03/19/2015  . 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[place]) 02/06/2015  . Post op infection 02/05/2015  . History of lumbar laminectomy for spinal cord decompression 01/17/2015  . Lumbar spinal stenosis 01/17/2015  . Primary osteoarthritis of left knee 03/25/2014  . Rheumatoid arthritis of multiple sites with negative rheumatoid factor ([place]) 03/25/2014  . History of total knee replacement, left 03/25/2014  . DERMATITIS 09/28/2008  . CONSTIPATION 05/25/2008  . DYSPNEA ON EXERTION 05/25/2008  . ANEMIA, NORMOCYTIC 04/13/2008  . ANKLE PAIN, BILATERAL 11/25/2007   . HLD (hyperlipidemia) 08/14/2006  . Morbid (severe) obesity due to excess calories ([place]) 12/13/2005  . CARPAL TUNNEL SYNDROME 12/13/2005  . PERIPHERAL NEUROPATHY 12/13/2005  . Essential hypertension 12/13/2005  . DYSRHYTHMIA, CARDIAC NOS 12/13/2005    Standley Brooking, PTA 11/09/2019, 10:58 AM  [facility] [address] [place], [place], [address] Phone: [phone]   Fax:  [phone]  Name: [name] MRN: [id number] Date of Birth: Mar 04, 1945

## 2019-11-09 NOTE — Telephone Encounter (Signed)
Last Visit:09/18/2019 Next Visit:02/18/2020 UDS: 08/06/2019 Narc Agreement: 02/03/2019  Last Fill: 10/08/2019  Okay to refill Tramadol?

## 2019-11-10 ENCOUNTER — Other Ambulatory Visit (HOSPITAL_COMMUNITY)
Admission: RE | Admit: 2019-11-10 | Discharge: 2019-11-10 | Disposition: A | Discharge status: Home / Self Care | Payer: Medicare PPO | Source: Ambulatory Visit | Attending: Gastroenterology | Admitting: Gastroenterology

## 2019-11-10 DIAGNOSIS — Z01812 Encounter for preprocedural laboratory examination: Secondary | ICD-10-CM | POA: Insufficient documentation

## 2019-11-10 DIAGNOSIS — Z20822 Contact with and (suspected) exposure to covid-19: Secondary | ICD-10-CM | POA: Insufficient documentation

## 2019-11-10 LAB — SARS CORONAVIRUS 2 (TAT 6-24 HRS): SARS Coronavirus 2: NEGATIVE

## 2019-11-11 ENCOUNTER — Encounter: Payer: Medicare PPO | Admitting: Physical Therapy

## 2019-11-13 ENCOUNTER — Encounter (HOSPITAL_COMMUNITY)
Admission: RE | Disposition: A | Discharge status: Home / Self Care | Payer: Self-pay | Source: Home / Self Care | Attending: Gastroenterology

## 2019-11-13 ENCOUNTER — Ambulatory Visit (HOSPITAL_COMMUNITY): Payer: Medicare PPO | Admitting: Certified Registered Nurse Anesthetist

## 2019-11-13 ENCOUNTER — Other Ambulatory Visit: Payer: Self-pay

## 2019-11-13 ENCOUNTER — Telehealth: Payer: Self-pay

## 2019-11-13 ENCOUNTER — Other Ambulatory Visit: Payer: Self-pay | Admitting: *Deleted

## 2019-11-13 ENCOUNTER — Ambulatory Visit (HOSPITAL_COMMUNITY)
Admission: RE | Admit: 2019-11-13 | Discharge: 2019-11-13 | Disposition: A | Discharge status: Home / Self Care | Payer: Medicare PPO | Attending: Gastroenterology | Admitting: Gastroenterology

## 2019-11-13 DIAGNOSIS — K449 Diaphragmatic hernia without obstruction or gangrene: Secondary | ICD-10-CM | POA: Insufficient documentation

## 2019-11-13 DIAGNOSIS — Z96652 Presence of left artificial knee joint: Secondary | ICD-10-CM | POA: Insufficient documentation

## 2019-11-13 DIAGNOSIS — D125 Benign neoplasm of sigmoid colon: Secondary | ICD-10-CM | POA: Insufficient documentation

## 2019-11-13 DIAGNOSIS — Z87891 Personal history of nicotine dependence: Secondary | ICD-10-CM | POA: Insufficient documentation

## 2019-11-13 DIAGNOSIS — K5730 Diverticulosis of large intestine without perforation or abscess without bleeding: Secondary | ICD-10-CM | POA: Diagnosis not present

## 2019-11-13 DIAGNOSIS — Z79899 Other long term (current) drug therapy: Secondary | ICD-10-CM

## 2019-11-13 DIAGNOSIS — Z888 Allergy status to other drugs, medicaments and biological substances status: Secondary | ICD-10-CM | POA: Diagnosis not present

## 2019-11-13 DIAGNOSIS — D509 Iron deficiency anemia, unspecified: Secondary | ICD-10-CM | POA: Insufficient documentation

## 2019-11-13 DIAGNOSIS — D122 Benign neoplasm of ascending colon: Secondary | ICD-10-CM | POA: Insufficient documentation

## 2019-11-13 HISTORY — PX: ESOPHAGOGASTRODUODENOSCOPY (EGD) WITH PROPOFOL: SHX5813

## 2019-11-13 HISTORY — PX: POLYPECTOMY: SHX5525

## 2019-11-13 HISTORY — PX: COLONOSCOPY WITH PROPOFOL: SHX5780

## 2019-11-13 SURGERY — ESOPHAGOGASTRODUODENOSCOPY (EGD) WITH PROPOFOL
Anesthesia: Monitor Anesthesia Care

## 2019-11-13 MED ORDER — PROPOFOL 1000 MG/100ML IV EMUL
INTRAVENOUS | Status: AC
  Filled 2019-11-13: qty 100

## 2019-11-13 MED ORDER — SODIUM CHLORIDE 0.9 % IV SOLN: INTRAVENOUS | Status: DC

## 2019-11-13 MED ORDER — LACTATED RINGERS IV SOLN: INTRAVENOUS | Status: DC

## 2019-11-13 MED ORDER — PROPOFOL 10 MG/ML IV BOLUS
INTRAVENOUS | Status: DC | PRN
  Administered 2019-11-13 (×2): 20 mg via INTRAVENOUS

## 2019-11-13 MED ORDER — PROPOFOL 500 MG/50ML IV EMUL
INTRAVENOUS | Status: DC | PRN
  Administered 2019-11-13: 100 ug/kg/min via INTRAVENOUS

## 2019-11-13 MED ORDER — LIDOCAINE 2% (20 MG/ML) 5 ML SYRINGE
INTRAMUSCULAR | Status: DC | PRN
  Administered 2019-11-13: 100 mg via INTRAVENOUS

## 2019-11-13 SURGICAL SUPPLY — 25 items

## 2019-11-13 NOTE — Anesthesia Procedure Notes (Addendum)
Procedure Name: MAC Date/Time: 11/13/2019 9:52 AM Performed by: West Pugh, CRNA Pre-anesthesia Checklist: Emergency Drugs available, Patient identified, Suction available, Patient being monitored and Timeout performed Patient Re-evaluated:Patient Re-evaluated prior to induction Oxygen Delivery Method: Simple face mask Preoxygenation: Pre-oxygenation with 100% oxygen Induction Type: IV induction Placement Confirmation: positive ETCO2 Dental Injury: Teeth and Oropharynx as per pre-operative assessment  Comments: Bite block gently placed.

## 2019-11-13 NOTE — Anesthesia Preprocedure Evaluation (Signed)
Anesthesia Evaluation  Patient identified by MRN, date of birth, ID band Patient awake    Reviewed: Allergy & Precautions, NPO status , Patient's Chart, lab work & pertinent test results  History of Anesthesia Complications Negative for: history of anesthetic complications  Airway Mallampati: II  TM Distance: >3 FB Neck ROM: Full    Dental  (+) Teeth Intact   Pulmonary sleep apnea and Continuous Positive Airway Pressure Ventilation , former smoker,    Pulmonary exam normal        Cardiovascular hypertension, Pt. on medications Normal cardiovascular exam     Neuro/Psych TIAnegative psych ROS   GI/Hepatic Neg liver ROS, GERD  ,  Endo/Other  Morbid obesity  Renal/GU negative Renal ROS  negative genitourinary   Musculoskeletal  (+) Arthritis , Rheumatoid disorders,    Abdominal   Peds  Hematology negative hematology ROS (+)   Anesthesia Other Findings   Reproductive/Obstetrics                            Anesthesia Physical Anesthesia Plan  ASA: III  Anesthesia Plan: MAC   Post-op Pain Management:    Induction: Intravenous  PONV Risk Score and Plan: Propofol infusion, TIVA and Treatment may vary due to age or medical condition  Airway Management Planned: Natural Airway, Nasal Cannula and Simple Face Mask  Additional Equipment: None  Intra-op Plan:   Post-operative Plan:   Informed Consent: I have reviewed the patients History and Physical, chart, labs and discussed the procedure including the risks, benefits and alternatives for the proposed anesthesia with the patient or authorized representative who has indicated his/her understanding and acceptance.       Plan Discussed with:   Anesthesia Plan Comments:        Anesthesia Quick Evaluation

## 2019-11-13 NOTE — Op Note (Signed)
[facility] Patient Name: [name] Procedure Date: 11/13/2019 MRN: [id number] Attending MD: Carol Ada , MD Date of Birth: 06/28/1945 CSN: [id number] Age: 74 Admit Type: Outpatient Procedure:                Upper GI endoscopy Indications:              Iron deficiency anemia Providers:                Carol Ada, MD, Cleda Daub, RN, Elspeth Cho                            Tech., Technician, Lesia Sago, Technician,                            Christell Faith, CRNA Referring MD:              Medicines:                 Complications:            No immediate complications. Estimated Blood Loss:     Estimated blood loss: none. Procedure:                Pre-Anesthesia Assessment:                           - Prior to the procedure, a History and Physical                            was performed, and patient medications and                            allergies were reviewed. The patient's tolerance of                            previous anesthesia was also reviewed. The risks                            and benefits of the procedure and the sedation                            options and risks were discussed with the patient.                            All questions were answered, and informed consent                            was obtained. Prior Anticoagulants: The patient has                            taken no previous anticoagulant or antiplatelet                            agents. ASA Grade Assessment: III - A patient with                            severe systemic  disease. After reviewing the risks                            and benefits, the patient was deemed in                            satisfactory condition to undergo the procedure.                           - Sedation was administered by an anesthesia                            professional. Deep sedation was attained.                           After obtaining informed consent, the endoscope was                             passed under direct vision. Throughout the                            procedure, the patient's blood pressure, pulse, and                            oxygen saturations were monitored continuously. The                            PCF-H190DL ([id number] Olympus pediatric colonscope                            was introduced through the mouth, and advanced to                            the fourth part of duodenum. The upper GI endoscopy                            was accomplished without difficulty. The patient                            tolerated the procedure well. Scope In: Scope Out: Findings:      A 3 cm hiatal hernia was present.      The stomach was normal.      The examined duodenum was normal. Impression:               - 3 cm hiatal hernia.                           - Normal stomach.                           - Normal examined duodenum.                           - No specimens collected. Moderate Sedation:      Not Applicable - Patient had care per Anesthesia. Recommendation:           -  Patient has a contact number available for                            emergencies. The signs and symptoms of potential                            delayed complications were discussed with the                            patient. Return to normal activities tomorrow.                            Written discharge instructions were provided to the                            patient.                           - Resume previous diet.                           - Continue present medications. Procedure Code(s):        --- Professional ---                           [id number], Esophagogastroduodenoscopy, flexible,                            transoral; diagnostic, including collection of                            specimen(s) by brushing or washing, when performed                            (separate procedure) Diagnosis Code(s):        --- Professional ---                            D50.9, Iron deficiency anemia, unspecified                           K44.9, Diaphragmatic hernia without obstruction or                            gangrene CPT copyright 2019 American Medical Association. All rights reserved. The codes documented in this report are preliminary and upon coder review may  be revised to meet current compliance requirements. Carol Ada, MD Carol Ada, MD 11/13/2019 10:28:57 AM This report has been signed electronically. Number of Addenda: 0

## 2019-11-13 NOTE — Op Note (Signed)
[facility] Patient Name: [name] Procedure Date: 11/13/2019 MRN: [id number] Attending MD: Carol Ada , MD Date of Birth: [birth year] CSN: [id number] Age: 74 Admit Type: Outpatient Procedure:                Colonoscopy Indications:              Iron deficiency anemia Providers:                Carol Ada, MD, Cleda Daub, RN, Elspeth Cho                            Tech., Technician, Lesia Sago, Technician,                            Christell Faith, CRNA Referring MD:              Medicines:                 Complications:            No immediate complications. Estimated Blood Loss:     Estimated blood loss: none. Procedure:                Pre-Anesthesia Assessment:                           - Prior to the procedure, a History and Physical                            was performed, and patient medications and                            allergies were reviewed. The patient's tolerance of                            previous anesthesia was also reviewed. The risks                            and benefits of the procedure and the sedation                            options and risks were discussed with the patient.                            All questions were answered, and informed consent                            was obtained. Prior Anticoagulants: The patient has                            taken no previous anticoagulant or antiplatelet                            agents. ASA Grade Assessment: III - A patient with                            severe systemic disease. After  reviewing the risks                            and benefits, the patient was deemed in                            satisfactory condition to undergo the procedure.                           - Sedation was administered by an anesthesia                            professional. Deep sedation was attained.                           After obtaining informed consent, the colonoscope                             was passed under direct vision. Throughout the                            procedure, the patient's blood pressure, pulse, and                            oxygen saturations were monitored continuously. The                            PCF-H190DL ([id number] Olympus pediatric colonscope                            was introduced through the anus and advanced to the                            the cecum, identified by appendiceal orifice and                            ileocecal valve. The colonoscopy was performed                            without difficulty. The patient tolerated the                            procedure well. The quality of the bowel                            preparation was excellent. The ileocecal valve,                            appendiceal orifice, and rectum were photographed. Scope In: 10:04:28 AM Scope Out: 10:24:16 AM Scope Withdrawal Time: 0 hours 13 minutes 39 seconds  Total Procedure Duration: 0 hours 19 minutes 48 seconds  Findings:      Two sessile polyps were found in the sigmoid colon and ascending colon.       The polyps were 2 to 3 mm in size. These polyps  were removed with a cold       snare. Resection and retrieval were complete.      A 7 mm polyp was found in the rectum. The polyp was sessile. The polyp       was removed with a hot snare. Resection and retrieval were complete.      Scattered medium-mouthed diverticula were found in the transverse colon       and ascending colon. Impression:               - Two 2 to 3 mm polyps in the sigmoid colon and in                            the ascending colon, removed with a cold snare.                            Resected and retrieved.                           - One 7 mm polyp in the rectum, removed with a hot                            snare. Resected and retrieved.                           - Diverticulosis in the transverse colon and in the                            ascending colon. Moderate  Sedation:      Not Applicable - Patient had care per Anesthesia. Recommendation:           - Discharge patient to home.                           - Resume previous diet.                           - Continue present medications.                           - Await pathology results.                           - Repeat colonoscopy in 5 years for surveillance. Procedure Code(s):        --- Professional ---                           [phone], Colonoscopy, flexible; with removal of                            tumor(s), polyp(s), or other lesion(s) by snare                            technique Diagnosis Code(s):        --- Professional ---  K63.5, Polyp of colon                           K62.1, Rectal polyp                           D50.9, Iron deficiency anemia, unspecified                           K57.30, Diverticulosis of large intestine without                            perforation or abscess without bleeding CPT copyright 2019 American Medical Association. All rights reserved. The codes documented in this report are preliminary and upon coder review may  be revised to meet current compliance requirements. Carol Ada, MD Carol Ada, MD 11/13/2019 10:33:20 AM This report has been signed electronically. Number of Addenda: 0

## 2019-11-13 NOTE — Transfer of Care (Signed)
Immediate Anesthesia Transfer of Care Note  Patient: [name]  Procedure(s) Performed: ESOPHAGOGASTRODUODENOSCOPY (EGD) WITH PROPOFOL (N/A ) COLONOSCOPY WITH PROPOFOL (N/A ) POLYPECTOMY  Patient Location: PACU and Endoscopy Unit  Anesthesia Type:MAC  Level of Consciousness: awake, alert  and patient cooperative  Airway & Oxygen Therapy: Patient Spontanous Breathing and Patient connected to face mask oxygen  Post-op Assessment: Report given to RN and Post -op Vital signs reviewed and stable  Post vital signs: Reviewed and stable  Last Vitals:  Vitals Value Taken Time  BP    Temp    Pulse 75 11/13/19 1031  Resp 21 11/13/19 1031  SpO2 100 % 11/13/19 1031  Vitals shown include unvalidated device data.  Last Pain:  Vitals:   11/13/19 0909  TempSrc: Oral  PainSc: 0-No pain         Complications: No complications documented.

## 2019-11-13 NOTE — Telephone Encounter (Signed)
Patient called requesting her labwork orders be sent to [facility] in [place].  Patient states she will be going on Monday, 11/16/19.

## 2019-11-13 NOTE — H&P (Signed)
[name] HPI: This 74 year old black female presents to the office for colorectal cancer screening. She usually has 1 BM every other day but can go up to 3-4 days without a BM. There is no obvious blood or mucus in the stool. She has occasional acid reflux and usually takes Tums. She has a history of iron deficiency anemia. Labs done on 10/12/2019 revealed a hemoglobin of 11.9 gm/dl. She has good appetite and her weight has been stable. She denies having any complaints of abdominal pain, nausea, vomiting, dysphagia or odynophagia. She denies having a family history of colon cancer, celiac sprue or IBD. Her last colonoscopy done on 06/13/2009 revealed prominent internal and external hemorrhoids and a prominent anal papilla but the exam was otherwise normal.   Past Medical History:  Diagnosis Date  . Anemia   . Arthritis    Rheumatoid, followed by Dr. Estanislado Jordan, knees & ankles   . CHF (congestive heart failure) ([place])    pt not aware of this  . Constipation   . GERD (gastroesophageal reflux disease)   . H/O cardiovascular stress test 02/2014   seen by Dr. Einar Jordan - told that she was cleared for surgery  . Heart murmur    states she's never had any problems  . Hypertension   . Leg cramps   . Shortness of breath dyspnea    with exertion  . Sleep apnea    CPAP in use- QO night, study done at [facility]- 2007   . Stroke [facility]) 03/2015   TIA    Past Surgical History:  Procedure Laterality Date  . ABDOMINAL HYSTERECTOMY    . CARPAL TUNNEL RELEASE Right   . COLONOSCOPY    . FOOT SURGERY Bilateral    bunionectomy, calluses , also has several pins & screws  . HAND SURGERY    . JOINT REPLACEMENT     planned for 03/25/2014  . KNEE ARTHROPLASTY    . KNEE SURGERY  1990's   . LUMBAR LAMINECTOMY/DECOMPRESSION MICRODISCECTOMY N/A 01/17/2015   Procedure: L2-3, L3-4 Decompression;  Surgeon: Brittney Killings, MD;  Location: [place];  Service: Orthopedics;  Laterality: N/A;  . LUMBAR  LAMINECTOMY/DECOMPRESSION MICRODISCECTOMY N/A 02/06/2015   Procedure: I AND D LUMBAR WITH WOUND VAC PLACEMENT;  Surgeon: Brittney Killings, MD;  Location: [place];  Service: Orthopedics;  Laterality: N/A;  . ORIF TOE FRACTURE Right 07/30/2016   Procedure: OPEN REDUCTION INTERNAL FIXATION (ORIF) RIGHT 2ND METATARSAL (TOE) FRACTURE;  Surgeon: Brittney Killings, MD;  Location: [place];  Service: Orthopedics;  Laterality: Right;  . TOTAL KNEE ARTHROPLASTY Left 03/25/2014   Procedure: TOTAL KNEE ARTHROPLASTY;  Surgeon: Brittney Balding, MD;  Location: [place];  Service: Orthopedics;  Laterality: Left;  . TUBAL LIGATION      Family History  Problem Relation Age of Onset  . Seizures Mother   . Transient ischemic attack Mother   . Hypertension Mother   . Diabetes Mother   . Congestive Heart Failure Mother   . Arthritis/Rheumatoid Mother   . Parkinson's disease Mother   . Dementia Mother   . Heart disease Father        CHF  . Stroke Sister   . Depression Sister   . Arthritis Brother   . Arthritis Brother   . Arthritis Son   . Hypertension Son     Social History:  reports that she quit smoking about 39 years ago. She has never used smokeless tobacco. She reports that she does  not drink alcohol and does not use drugs.  Allergies:  Allergies  Allergen Reactions  . Ramipril Swelling    Medications:  Scheduled:  Continuous: . sodium chloride      No results found for this or any previous visit (from the past 24 hour(s)).   No results found.  ROS:  As stated above in the HPI otherwise negative.  There were no vitals taken for this visit.    PE: Gen: NAD, Alert and Oriented HEENT:  [place]/AT, EOMI Neck: Supple, no LAD Lungs: CTA Bilaterally CV: RRR without M/G/R ABD: Soft, NTND, +BS Ext: No C/C/E  Assessment/Plan: 1) IDA - EGD/colonoscopy.  [name] D 11/13/2019, 8:49 AM

## 2019-11-13 NOTE — Anesthesia Postprocedure Evaluation (Signed)
Anesthesia Post Note  Patient: [name]  Procedure(s) Performed: ESOPHAGOGASTRODUODENOSCOPY (EGD) WITH PROPOFOL (N/A ) COLONOSCOPY WITH PROPOFOL (N/A ) POLYPECTOMY     Patient location during evaluation: Endoscopy Anesthesia Type: MAC Level of consciousness: awake and alert Pain management: pain level controlled Vital Signs Assessment: post-procedure vital signs reviewed and stable Respiratory status: spontaneous breathing, nonlabored ventilation and respiratory function stable Cardiovascular status: blood pressure returned to baseline and stable Postop Assessment: no apparent nausea or vomiting Anesthetic complications: no   No complications documented.  Last Vitals:  Vitals:   11/13/19 1040 11/13/19 1050  BP: (!) 128/101 (!) 155/57  Pulse: 67 69  Resp: 13 (!) 26  Temp:    SpO2: 100% 100%    Last Pain:  Vitals:   11/13/19 1050  TempSrc:   PainSc: 0-No pain                 Lidia Collum

## 2019-11-13 NOTE — Discharge Instructions (Signed)

## 2019-11-13 NOTE — Telephone Encounter (Signed)
Lab Orders released.  

## 2019-11-16 ENCOUNTER — Encounter: Payer: Self-pay | Admitting: Physical Therapy

## 2019-11-16 ENCOUNTER — Telehealth: Payer: Self-pay

## 2019-11-16 ENCOUNTER — Ambulatory Visit: Payer: Medicare PPO | Admitting: Physical Therapy

## 2019-11-16 ENCOUNTER — Other Ambulatory Visit: Payer: Self-pay

## 2019-11-16 DIAGNOSIS — R2681 Unsteadiness on feet: Secondary | ICD-10-CM

## 2019-11-16 DIAGNOSIS — R296 Repeated falls: Secondary | ICD-10-CM

## 2019-11-16 LAB — SURGICAL PATHOLOGY

## 2019-11-16 NOTE — Telephone Encounter (Signed)
Will fax once results received.

## 2019-11-16 NOTE — Telephone Encounter (Signed)
Patient called stating she had her labwork this morning 11/16/19 at [place] and is requesting the results of her TB gold faxed to Normajean Glasgow at [facility].  Fax [phone]

## 2019-11-16 NOTE — Therapy (Signed)
[facility] [place], [place], [address] Phone: [phone]   Fax:  [phone]  Physical Therapy Treatment  Patient Details  Name: [name] MRN: [id number] Date of Birth: Jun 02, 1945 Referring Provider (PT): Vivianne Spence, MD   Encounter Date: 11/16/2019   PT End of Session - 11/16/19 0829    Visit Number 6    Number of Visits 12    Date for PT Re-Evaluation 12/14/19    PT Start Time 0818    PT Stop Time 0858    PT Time Calculation (min) 40 min    Equipment Utilized During Treatment Other (comment)   SPC   Activity Tolerance Patient tolerated treatment well    Behavior During Therapy [facility] for tasks assessed/performed           Past Medical History:  Diagnosis Date  . Anemia   . Arthritis    Rheumatoid, followed by Dr. Estanislado Pandy, knees & ankles   . CHF (congestive heart failure) ([place])    pt not aware of this  . Constipation   . GERD (gastroesophageal reflux disease)   . H/O cardiovascular stress test 02/2014   seen by Dr. Einar Gip - told that she was cleared for surgery  . Heart murmur    states she's never had any problems  . Hypertension   . Leg cramps   . Shortness of breath dyspnea    with exertion  . Sleep apnea    CPAP in use- QO night, [facility] done at [facility]- 2007   . Stroke [facility]) 03/2015   TIA    Past Surgical History:  Procedure Laterality Date  . ABDOMINAL HYSTERECTOMY    . CARPAL TUNNEL RELEASE Right   . COLONOSCOPY    . FOOT SURGERY Bilateral    bunionectomy, calluses , also has several pins & screws  . HAND SURGERY    . JOINT REPLACEMENT     planned for 03/25/2014  . KNEE ARTHROPLASTY    . KNEE SURGERY  1990's   . LUMBAR LAMINECTOMY/DECOMPRESSION MICRODISCECTOMY N/A 01/17/2015   Procedure: L2-3, L3-4 Decompression;  Surgeon: [name], MD;  Location: [place];  Service: Orthopedics;  Laterality: N/A;  . LUMBAR LAMINECTOMY/DECOMPRESSION MICRODISCECTOMY N/A 02/06/2015   Procedure: I AND D  LUMBAR WITH WOUND VAC PLACEMENT;  Surgeon: [name], MD;  Location: [place];  Service: Orthopedics;  Laterality: N/A;  . ORIF TOE FRACTURE Right 07/30/2016   Procedure: OPEN REDUCTION INTERNAL FIXATION (ORIF) RIGHT 2ND METATARSAL (TOE) FRACTURE;  Surgeon: [name], MD;  Location: [place];  Service: Orthopedics;  Laterality: Right;  . TOTAL KNEE ARTHROPLASTY Left 03/25/2014   Procedure: TOTAL KNEE ARTHROPLASTY;  Surgeon: Garald Balding, MD;  Location: [place];  Service: Orthopedics;  Laterality: Left;  . TUBAL LIGATION      There were no vitals filed for this visit.   Subjective Assessment - 11/16/19 0829    Subjective COVID-19 screen performed prior to patient entering clinic.  No new complaints and denies any LE cramping recently as well.    Pertinent History Arthrits, HTN, history of lumbar surgery and knee replacement    Limitations House hold activities    Currently in Pain? No/denies              [facility] PT Assessment - 11/16/19 0001      Assessment   Medical Diagnosis Abnormal gait    Referring Provider (PT) Vivianne Spence, MD    Next MD Visit "3 to  6 months"    Prior Therapy no`      Precautions   Precautions Fall      Restrictions   Weight Bearing Restrictions No                         [facility] Adult PT Treatment/Exercise - 11/16/19 0001      Standardized Balance Assessment   Standardized Balance Assessment Berg Balance Test      Berg Balance Test   Sit to Stand Able to stand without using hands and stabilize independently    Standing Unsupported Able to stand safely 2 minutes    Sitting with Back Unsupported but Feet Supported on Floor or Stool Able to sit safely and securely 2 minutes    Stand to Sit Sits safely with minimal use of hands    Transfers Able to transfer safely, minor use of hands    Standing Unsupported with Eyes Closed Able to stand 10 seconds safely    Standing Ubsupported with Feet Together Able to place feet together  independently and stand 1 minute safely    From Standing, Reach Forward with Outstretched Arm Can reach forward >5 cm safely (2")    From Standing Position, Pick up Object from Floor Able to pick up shoe safely and easily    From Standing Position, Turn to Look Behind Over each Shoulder Looks behind one side only/other side shows less weight shift    Turn 360 Degrees Able to turn 360 degrees safely but slowly    Standing Unsupported, Alternately Place Feet on Step/Stool Needs assistance to keep from falling or unable to try    Standing Unsupported, One Foot in Front Needs help to step but can hold 15 seconds    Standing on One Leg Tries to lift leg/unable to hold 3 seconds but remains standing independently    Total Score 41      Knee/Hip Exercises: Aerobic   Nustep Level 4 x 15 minutes.      Knee/Hip Exercises: Machines for Strengthening   Cybex Knee Extension 10# 3x10 reps    Cybex Knee Flexion 30# 3x10 reps      Knee/Hip Exercises: Seated   Sit to Sand 5 reps;without UE support   achieved in 13 seconds                      PT Long Term Goals - 11/16/19 0904      PT LONG TERM GOAL #1   Title Patient will be independent with HEP    Time 6    Period Weeks    Status On-going      PT LONG TERM GOAL #2   Title Patient will demonstrate 4+/5  or greater bilateral LE MMT to improve stability during functional tasks.    Time 6    Period Weeks    Status On-going      PT LONG TERM GOAL #3   Title Patient will demonstrate a 4+ point improvement on Berg Balance Scale to decrease risk of falls.    Time 6    Period Weeks    Status On-going      PT LONG TERM GOAL #4   Title Patient will perform 5x sit to stand test in 13 seconds or less with no UE support to improve balance and improve functional LE strength.    Baseline 4x 13 seconds 11/02/19    Time 6    Period Weeks  Status Achieved                 Plan - 11/16/19 0905    Clinical Impression Statement  Patient presented in clinic with reports of improvement as she has noticed with ADLs. Patient progressed through other strengthening for LE and able to achieve LTG of sit <> stands. Patient limited with BERG score of 41/56 due to LBP and discomfort from RA. Patient educated regarding scores of BERG assessment and aware of safety that is already in place such as bars in shower as well as use of AD for ambulation.    Personal Factors and Comorbidities Comorbidity 2;Time since onset of injury/illness/exacerbation    Comorbidities Arthrits, HTN, history of lumbar surgery and knee replacement    Examination-Activity Limitations Stairs;Stand;Locomotion Level    Examination-Participation Restrictions Cleaning    Stability/Clinical Decision Making Stable/Uncomplicated    Rehab Potential Good    PT Frequency 2x / week    PT Duration 6 weeks    PT Treatment/Interventions ADLs/Self Care Home Management;Gait training;Stair training;Functional mobility training;Therapeutic activities;Passive range of motion;Patient/family education;Therapeutic exercise;Balance training;Neuromuscular re-education;Manual techniques    PT Next Visit Plan cont with POC LE strengthening, balance activities as tolerated.    PT Home Exercise Plan see patient education section    Consulted and Agree with Plan of Care Patient           Patient will benefit from skilled therapeutic intervention in order to improve the following deficits and impairments:  Abnormal gait, Difficulty walking, Decreased balance, Decreased activity tolerance, Decreased strength, Decreased mobility  Visit Diagnosis: Unsteadiness on feet  Repeated falls     Problem List Patient Active Problem List   Diagnosis Date Noted  . Unilateral primary osteoarthritis, right knee 11/18/2018  . Closed fracture of shaft of metatarsal bone 07/30/2016  . Metatarsal fracture 07/30/2016  . Displaced fracture of second metatarsal bone, right foot, initial encounter  for closed fracture 07/26/2016  . Arthritis of shoulder 04/10/2016  . High risk medication use 03/16/2016  . Primary osteoarthritis of both feet 03/16/2016  . Primary osteoarthritis of both knees 03/16/2016  . DDD (degenerative disc disease), lumbar 03/16/2016  . OSA on CPAP 07/27/2015  . Lip numbness 03/19/2015  . TIA (transient ischemic attack) 03/19/2015  . 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 Post op infection 02/05/2015  . History of lumbar laminectomy for spinal cord decompression 01/17/2015  . Lumbar spinal stenosis 01/17/2015  . Primary osteoarthritis of left knee 03/25/2014  . 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 History of total knee replacement, left 03/25/2014  . DERMATITIS 09/28/2008  . CONSTIPATION 05/25/2008  . DYSPNEA ON EXERTION 05/25/2008  . ANEMIA, NORMOCYTIC 04/13/2008  . ANKLE PAIN, BILATERAL 11/25/2007  . HLD (hyperlipidemia) 08/14/2006  . Morbid (severe) obesity due to excess calories ([place]) 12/13/2005  . CARPAL TUNNEL SYNDROME 12/13/2005  . PERIPHERAL NEUROPATHY 12/13/2005  . Essential hypertension 12/13/2005  . DYSRHYTHMIA, CARDIAC NOS 12/13/2005    Standley Brooking, PTA 11/16/2019, 9:08 AM  [facility] [address] [place], [place], [address] Phone: [phone]   Fax:  [phone]  Name: [name] MRN: [id number] Date of Birth: [birth year]

## 2019-11-17 ENCOUNTER — Encounter (HOSPITAL_COMMUNITY): Payer: Self-pay | Admitting: Gastroenterology

## 2019-11-18 ENCOUNTER — Other Ambulatory Visit: Payer: Self-pay

## 2019-11-18 ENCOUNTER — Encounter: Payer: Self-pay | Admitting: Physical Therapy

## 2019-11-18 ENCOUNTER — Ambulatory Visit: Payer: Medicare PPO | Admitting: Physical Therapy

## 2019-11-18 DIAGNOSIS — R2681 Unsteadiness on feet: Secondary | ICD-10-CM | POA: Diagnosis not present

## 2019-11-18 DIAGNOSIS — R296 Repeated falls: Secondary | ICD-10-CM

## 2019-11-18 NOTE — Therapy (Signed)
[facility] [place], [place], [address] Phone: [phone]   Fax:  [phone]  Physical Therapy Treatment  Patient Details  Name: [name] MRN: [id number] Date of Birth: Jun 15, 1945 Referring Provider (PT): Vivianne Spence, MD   Encounter Date: 11/18/2019   PT End of Session - 11/18/19 0838    Visit Number 7    Number of Visits 12    Date for PT Re-Evaluation 12/14/19    PT Start Time 0818    PT Stop Time 0858    PT Time Calculation (min) 40 min    Equipment Utilized During Treatment Other (comment)   SPC   Activity Tolerance Patient tolerated treatment well    Behavior During Therapy [facility] for tasks assessed/performed           Past Medical History:  Diagnosis Date  . Anemia   . Arthritis    Rheumatoid, followed by Dr. Estanislado Pandy, knees & ankles   . CHF (congestive heart failure) ([place])    pt not aware of this  . Constipation   . GERD (gastroesophageal reflux disease)   . H/O cardiovascular stress test 02/2014   seen by Dr. Einar Gip - told that she was cleared for surgery  . Heart murmur    states she's never had any problems  . Hypertension   . Leg cramps   . Shortness of breath dyspnea    with exertion  . Sleep apnea    CPAP in use- QO night, study done at [facility]- 2007   . Stroke [facility]) 03/2015   TIA    Past Surgical History:  Procedure Laterality Date  . ABDOMINAL HYSTERECTOMY    . CARPAL TUNNEL RELEASE Right   . COLONOSCOPY    . COLONOSCOPY WITH PROPOFOL N/A 11/13/2019   Procedure: COLONOSCOPY WITH PROPOFOL;  Surgeon: Carol Ada, MD;  [facility];  Service: Endoscopy;  Laterality: N/A;  . ESOPHAGOGASTRODUODENOSCOPY (EGD) WITH PROPOFOL N/A 11/13/2019   Procedure: ESOPHAGOGASTRODUODENOSCOPY (EGD) WITH PROPOFOL;  Surgeon: Carol Ada, MD;  Location: [facility];  Service: Endoscopy;  Laterality: N/A;  . FOOT SURGERY Bilateral    bunionectomy, calluses , also has several pins & screws  .  HAND SURGERY    . JOINT REPLACEMENT     planned for 03/25/2014  . KNEE ARTHROPLASTY    . KNEE SURGERY  1990's   . LUMBAR LAMINECTOMY/DECOMPRESSION MICRODISCECTOMY N/A 01/17/2015   Procedure: L2-3, L3-4 Decompression;  Surgeon: [name], MD;  Location: [place];  Service: Orthopedics;  Laterality: N/A;  . LUMBAR LAMINECTOMY/DECOMPRESSION MICRODISCECTOMY N/A 02/06/2015   Procedure: I AND D LUMBAR WITH WOUND VAC PLACEMENT;  Surgeon: [name], MD;  Location: [place];  Service: Orthopedics;  Laterality: N/A;  . ORIF TOE FRACTURE Right 07/30/2016   Procedure: OPEN REDUCTION INTERNAL FIXATION (ORIF) RIGHT 2ND METATARSAL (TOE) FRACTURE;  Surgeon: [name], MD;  Location: [place];  Service: Orthopedics;  Laterality: Right;  . POLYPECTOMY  11/13/2019   Procedure: POLYPECTOMY;  Surgeon: Carol Ada, MD;  Location[facility];  Service: Endoscopy;;  . TOTAL KNEE ARTHROPLASTY Left 03/25/2014   Procedure: TOTAL KNEE ARTHROPLASTY;  Surgeon: Garald Balding, MD;  Location: [place];  Service: Orthopedics;  Laterality: Left;  . TUBAL LIGATION      There were no vitals filed for this visit.   Subjective Assessment - 11/18/19 0834    Subjective COVID-19 screen performed prior to patient entering clinic.  No new complaints.    Pertinent History  Arthrits, HTN, history of lumbar surgery and knee replacement    Limitations House hold activities    Currently in Pain? No/denies              [facility] PT Assessment - 11/18/19 0001      Assessment   Medical Diagnosis Abnormal gait    Referring Provider (PT) Vivianne Spence, MD    Next MD Visit "3 to 6 months"    Prior Therapy no`      Precautions   Precautions Fall      Restrictions   Weight Bearing Restrictions No                         [facility] Adult PT Treatment/Exercise - 11/18/19 0001      Knee/Hip Exercises: Aerobic   Nustep Level 4 x 15 minutes.      Knee/Hip Exercises: Machines for Strengthening   Cybex Knee Extension 10#  3x10 reps    Cybex Knee Flexion 30# 3x10 reps      Knee/Hip Exercises: Standing   Heel Raises Both;20 reps    Heel Raises Limitations B toe raise x20 reps    Hip Abduction Stengthening;Both;2 sets;10 reps;Knee straight    Hip Extension Stengthening;Both;2 sets;10 reps;Knee straight      Knee/Hip Exercises: Seated   Clamshell with TheraBand Red   x20 reps              Balance Exercises - 11/18/19 0001      Balance Exercises: Standing   Standing Eyes Opened Narrow base of support (BOS);Foam/compliant surface   diagonal reaching, forward reaching, crossover midline reach   Tandem Gait Forward;Retro;Intermittent upper extremity support;Foam/compliant surface;5 reps    Sidestepping Foam/compliant support;Upper extremity support;5 reps    Marching Foam/compliant surface;Intermittent upper extremity assist;10 reps                  PT Long Term Goals - 11/16/19 0904      PT LONG TERM GOAL #1   Title Patient will be independent with HEP    Time 6    Period Weeks    Status On-going      PT LONG TERM GOAL #2   Title Patient will demonstrate 4+/5  or greater bilateral LE MMT to improve stability during functional tasks.    Time 6    Period Weeks    Status On-going      PT LONG TERM GOAL #3   Title Patient will demonstrate a 4+ point improvement on Berg Balance Scale to decrease risk of falls.    Time 6    Period Weeks    Status On-going      PT LONG TERM GOAL #4   Title Patient will perform 5x sit to stand test in 13 seconds or less with no UE support to improve balance and improve functional LE strength.    Baseline 4x 13 seconds 11/02/19    Time 6    Period Weeks    Status Achieved                 Plan - 11/18/19 1914    Clinical Impression Statement Patient presented in clinic with no complaints of any increased pain. Patient guided through more functional hip strengthening. Patient progressed through more advanced trunk and balance activities today  with minimal rest breaks due to fatigue and LBP. Appropriate ankle strategy noted during balance activities.    Personal Factors and Comorbidities Comorbidity 2;Time since  onset of injury/illness/exacerbation    Comorbidities Arthrits, HTN, history of lumbar surgery and knee replacement    Examination-Activity Limitations Stairs;Stand;Locomotion Level    Examination-Participation Restrictions Cleaning    Stability/Clinical Decision Making Stable/Uncomplicated    Rehab Potential Good    PT Frequency 2x / week    PT Duration 6 weeks    PT Treatment/Interventions ADLs/Self Care Home Management;Gait training;Stair training;Functional mobility training;Therapeutic activities;Passive range of motion;Patient/family education;Therapeutic exercise;Balance training;Neuromuscular re-education;Manual techniques    PT Next Visit Plan cont with POC LE strengthening, balance activities as tolerated.    PT Home Exercise Plan see patient education section    Consulted and Agree with Plan of Care Patient           Patient will benefit from skilled therapeutic intervention in order to improve the following deficits and impairments:  Abnormal gait, Difficulty walking, Decreased balance, Decreased activity tolerance, Decreased strength, Decreased mobility  Visit Diagnosis: Unsteadiness on feet  Repeated falls     Problem List Patient Active Problem List   Diagnosis Date Noted  . Unilateral primary osteoarthritis, right knee 11/18/2018  . Closed fracture of shaft of metatarsal bone 07/30/2016  . Metatarsal fracture 07/30/2016  . Displaced fracture of second metatarsal bone, right foot, initial encounter for closed fracture 07/26/2016  . Arthritis of shoulder 04/10/2016  . High risk medication use 03/16/2016  . Primary osteoarthritis of both feet 03/16/2016  . Primary osteoarthritis of both knees 03/16/2016  . DDD (degenerative disc disease), lumbar 03/16/2016  . OSA on CPAP 07/27/2015  . Lip  numbness 03/19/2015  . TIA (transient ischemic attack) 03/19/2015  . 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 Post op infection 02/05/2015  . History of lumbar laminectomy for spinal cord decompression 01/17/2015  . Lumbar spinal stenosis 01/17/2015  . Primary osteoarthritis of left knee 03/25/2014  . 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 History of total knee replacement, left 03/25/2014  . DERMATITIS 09/28/2008  . CONSTIPATION 05/25/2008  . DYSPNEA ON EXERTION 05/25/2008  . ANEMIA, NORMOCYTIC 04/13/2008  . ANKLE PAIN, BILATERAL 11/25/2007  . HLD (hyperlipidemia) 08/14/2006  . Morbid (severe) obesity due to excess calories ([place]) 12/13/2005  . CARPAL TUNNEL SYNDROME 12/13/2005  . PERIPHERAL NEUROPATHY 12/13/2005  . Essential hypertension 12/13/2005  . DYSRHYTHMIA, CARDIAC NOS 12/13/2005    Standley Brooking, PTA 11/18/2019, 9:57 AM  [facility] [address] [place], [place], [address] Phone: [phone]   Fax:  [phone]  Name: JENAVIE STANCZAK MRN: [id number] Date of Birth: March 05, 1945

## 2019-11-19 LAB — CBC WITH DIFFERENTIAL/PLATELET
Basophils Absolute: 0.1 10*3/uL (ref 0.0–0.2)
Basos: 1 %
EOS (ABSOLUTE): 0.2 10*3/uL (ref 0.0–0.4)
Eos: 2 %
Hematocrit: 34.4 % (ref 34.0–46.6)
Hemoglobin: 11.4 g/dL (ref 11.1–15.9)
Immature Grans (Abs): 0 10*3/uL (ref 0.0–0.1)
Immature Granulocytes: 0 %
Lymphocytes Absolute: 2.1 10*3/uL (ref 0.7–3.1)
Lymphs: 25 %
MCH: 28.7 pg (ref 26.6–33.0)
MCHC: 33.1 g/dL (ref 31.5–35.7)
MCV: 87 fL (ref 79–97)
Monocytes Absolute: 0.4 10*3/uL (ref 0.1–0.9)
Monocytes: 5 %
Neutrophils Absolute: 5.8 10*3/uL (ref 1.4–7.0)
Neutrophils: 67 %
Platelets: 285 10*3/uL (ref 150–450)
RBC: 3.97 x10E6/uL (ref 3.77–5.28)
RDW: 12.5 % (ref 11.7–15.4)
WBC: 8.6 10*3/uL (ref 3.4–10.8)

## 2019-11-19 LAB — CMP14+EGFR
ALT: 9 IU/L (ref 0–32)
AST: 17 IU/L (ref 0–40)
Albumin/Globulin Ratio: 1.2 (ref 1.2–2.2)
Albumin: 3.8 g/dL (ref 3.7–4.7)
Alkaline Phosphatase: 129 IU/L — ABNORMAL HIGH (ref 44–121)
BUN/Creatinine Ratio: 15 (ref 12–28)
BUN: 15 mg/dL (ref 8–27)
Bilirubin Total: 0.2 mg/dL (ref 0.0–1.2)
CO2: 25 mmol/L (ref 20–29)
Calcium: 8.9 mg/dL (ref 8.7–10.3)
Chloride: 109 mmol/L — ABNORMAL HIGH (ref 96–106)
Creatinine, Ser: 1.03 mg/dL — ABNORMAL HIGH (ref 0.57–1.00)
GFR calc Af Amer: 62 mL/min/{1.73_m2} (ref >59)
GFR calc non Af Amer: 54 mL/min/{1.73_m2} — ABNORMAL LOW (ref >59)
Globulin, Total: 3.1 g/dL (ref 1.5–4.5)
Glucose: 95 mg/dL (ref 65–99)
Potassium: 4 mmol/L (ref 3.5–5.2)
Sodium: 147 mmol/L — ABNORMAL HIGH (ref 134–144)
Total Protein: 6.9 g/dL (ref 6.0–8.5)

## 2019-11-19 LAB — QUANTIFERON-TB GOLD PLUS
QuantiFERON Mitogen Value: >10 IU/mL
QuantiFERON Nil Value: 0.01 IU/mL
QuantiFERON TB1 Ag Value: 0.04 IU/mL
QuantiFERON TB2 Ag Value: 0.02 IU/mL
QuantiFERON-TB Gold Plus: NEGATIVE

## 2019-11-19 NOTE — Telephone Encounter (Signed)
TB Gold sent as requested.

## 2019-11-23 ENCOUNTER — Ambulatory Visit: Payer: Medicare PPO | Admitting: Physical Therapy

## 2019-11-23 ENCOUNTER — Other Ambulatory Visit: Payer: Self-pay

## 2019-11-23 DIAGNOSIS — R2681 Unsteadiness on feet: Secondary | ICD-10-CM | POA: Diagnosis not present

## 2019-11-23 DIAGNOSIS — R296 Repeated falls: Secondary | ICD-10-CM

## 2019-11-23 NOTE — Therapy (Signed)
[facility] [place], [place], [address] Phone: [phone]   Fax:  [phone]  Physical Therapy Treatment  Patient Details  Name: [name] MRN: [id number] Date of Birth: August 09, 1945 Referring Provider (PT): Vivianne Spence, MD   Encounter Date: 11/23/2019   PT End of Session - 11/23/19 0825    Visit Number 8    Number of Visits 12    Date for PT Re-Evaluation 12/14/19    PT Start Time 0815    PT Stop Time 9509    PT Time Calculation (min) 32 min    Activity Tolerance Patient tolerated treatment well    Behavior During Therapy [facility] for tasks assessed/performed           Past Medical History:  Diagnosis Date  . Anemia   . Arthritis    Rheumatoid, followed by Dr. Estanislado Pandy, knees & ankles   . CHF (congestive heart failure) ([place])    pt not aware of this  . Constipation   . GERD (gastroesophageal reflux disease)   . H/O cardiovascular stress test 02/2014   seen by Dr. Einar Gip - told that she was cleared for surgery  . Heart murmur    states she's never had any problems  . Hypertension   . Leg cramps   . Shortness of breath dyspnea    with exertion  . Sleep apnea    CPAP in use- QO night, study done at [facility]- 2007   . Stroke [facility]) 03/2015   TIA    Past Surgical History:  Procedure Laterality Date  . ABDOMINAL HYSTERECTOMY    . CARPAL TUNNEL RELEASE Right   . COLONOSCOPY    . COLONOSCOPY WITH PROPOFOL N/A 11/13/2019   Procedure: COLONOSCOPY WITH PROPOFOL;  Surgeon: Carol Ada, MD;  Location: [facility];  Service: Endoscopy;  Laterality: N/A;  . ESOPHAGOGASTRODUODENOSCOPY (EGD) WITH PROPOFOL N/A 11/13/2019   Procedure: ESOPHAGOGASTRODUODENOSCOPY (EGD) WITH PROPOFOL;  Surgeon: Carol Ada, MD;  [facility]: [facility];  Service: Endoscopy;  Laterality: N/A;  . FOOT SURGERY Bilateral    bunionectomy, calluses , also has several pins & screws  . HAND SURGERY    . JOINT REPLACEMENT     planned for  03/25/2014  . KNEE ARTHROPLASTY    . KNEE SURGERY  1990's   . LUMBAR LAMINECTOMY/DECOMPRESSION MICRODISCECTOMY N/A 01/17/2015   Procedure: L2-3, L3-4 Decompression;  Surgeon: [name], MD;  Location: [place];  Service: Orthopedics;  Laterality: N/A;  . LUMBAR LAMINECTOMY/DECOMPRESSION MICRODISCECTOMY N/A 02/06/2015   Procedure: I AND D LUMBAR WITH WOUND VAC PLACEMENT;  Surgeon: [name], MD;  Location: [employer];  Service: Orthopedics;  Laterality: N/A;  . ORIF TOE FRACTURE Right 07/30/2016   Procedure: OPEN REDUCTION INTERNAL FIXATION (ORIF) RIGHT 2ND METATARSAL (TOE) FRACTURE;  Surgeon: [name], MD;  Location: [place];  Service: Orthopedics;  Laterality: Right;  . POLYPECTOMY  11/13/2019   Procedure: POLYPECTOMY;  Surgeon: Carol Ada, MD;  [facility];  Service: Endoscopy;;  . TOTAL KNEE ARTHROPLASTY Left 03/25/2014   Procedure: TOTAL KNEE ARTHROPLASTY;  Surgeon: Garald Balding, MD;  Location: [place];  Service: Orthopedics;  Laterality: Left;  . TUBAL LIGATION      There were no vitals filed for this visit.   Subjective Assessment - 11/23/19 0824    Subjective COVID-19 screen performed prior to patient entering clinic.  Knees bothering me some.    Pertinent History Arthrits, HTN, history of lumbar surgery and knee replacement  Limitations House hold activities                             [facility] Adult PT Treatment/Exercise - 11/23/19 0001      Neuro Re-ed    Neuro Re-ed Details  Rockerboard in parallel bars x 5 minutes.      Exercises   Exercises Knee/Hip      Knee/Hip Exercises: Aerobic   Nustep Level 4 x 15 minutes.      Knee/Hip Exercises: Machines for Strengthening   Cybex Knee Extension 10# x 2 minutes    Cybex Knee Flexion 30# x 2 minutes.      Knee/Hip Exercises: Standing   Other Standing Knee Exercises Resisted walking against green XTS band x 3 minutes.                       PT Long Term Goals - 11/16/19 0904       PT LONG TERM GOAL #1   Title Patient will be independent with HEP    Time 6    Period Weeks    Status On-going      PT LONG TERM GOAL #2   Title Patient will demonstrate 4+/5  or greater bilateral LE MMT to improve stability during functional tasks.    Time 6    Period Weeks    Status On-going      PT LONG TERM GOAL #3   Title Patient will demonstrate a 4+ point improvement on Berg Balance Scale to decrease risk of falls.    Time 6    Period Weeks    Status On-going      PT LONG TERM GOAL #4   Title Patient will perform 5x sit to stand test in 13 seconds or less with no UE support to improve balance and improve functional LE strength.    Baseline 4x 13 seconds 11/02/19    Time 6    Period Weeks    Status Achieved                 Plan - 11/23/19 0853    Clinical Impression Statement The patient is very motivated. She is, however, limited some by knee and back pain.  She plans on getting her knee replaced next year.    Personal Factors and Comorbidities Comorbidity 2;Time since onset of injury/illness/exacerbation    Comorbidities Arthrits, HTN, history of lumbar surgery and knee replacement    Examination-Activity Limitations Stairs;Stand;Locomotion Level    Examination-Participation Restrictions Cleaning    Stability/Clinical Decision Making Stable/Uncomplicated    Rehab Potential Good    PT Frequency 2x / week    PT Duration 6 weeks    PT Treatment/Interventions ADLs/Self Care Home Management;Gait training;Stair training;Functional mobility training;Therapeutic activities;Passive range of motion;Patient/family education;Therapeutic exercise;Balance training;Neuromuscular re-education;Manual techniques    PT Next Visit Plan cont with POC LE strengthening, balance activities as tolerated.    PT Home Exercise Plan see patient education section    Consulted and Agree with Plan of Care Patient           Patient will benefit from skilled therapeutic intervention in  order to improve the following deficits and impairments:  Abnormal gait, Difficulty walking, Decreased balance, Decreased activity tolerance, Decreased strength, Decreased mobility  Visit Diagnosis: Unsteadiness on feet  Repeated falls     Problem List Patient Active Problem List   Diagnosis Date Noted  . Unilateral primary osteoarthritis, right  knee 11/18/2018  . Closed fracture of shaft of metatarsal bone 07/30/2016  . Metatarsal fracture 07/30/2016  . Displaced fracture of second metatarsal bone, right foot, initial encounter for closed fracture 07/26/2016  . Arthritis of shoulder 04/10/2016  . High risk medication use 03/16/2016  . Primary osteoarthritis of both feet 03/16/2016  . Primary osteoarthritis of both knees 03/16/2016  . DDD (degenerative disc disease), lumbar 03/16/2016  . OSA on CPAP 07/27/2015  . Lip numbness 03/19/2015  . TIA (transient ischemic attack) 03/19/2015  . 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[place]) 02/06/2015  . Post op infection 02/05/2015  . History of lumbar laminectomy for spinal cord decompression 01/17/2015  . Lumbar spinal stenosis 01/17/2015  . Primary osteoarthritis of left knee 03/25/2014  . 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 History of total knee replacement, left 03/25/2014  . DERMATITIS 09/28/2008  . CONSTIPATION 05/25/2008  . DYSPNEA ON EXERTION 05/25/2008  . ANEMIA, NORMOCYTIC 04/13/2008  . ANKLE PAIN, BILATERAL 11/25/2007  . HLD (hyperlipidemia) 08/14/2006  . Morbid (severe) obesity due to excess calories ([place]) 12/13/2005  . CARPAL TUNNEL SYNDROME 12/13/2005  . PERIPHERAL NEUROPATHY 12/13/2005  . Essential hypertension 12/13/2005  . DYSRHYTHMIA, CARDIAC NOS 12/13/2005    Issam Carlyon, [place] MPT 11/23/2019, 9:10 AM  [facility] [address] [place], [place], [address] Phone: [phone]   Fax:  [phone]  Name: [name] MRN:  [id number] Date of Birth: 02-02-1945

## 2019-11-24 ENCOUNTER — Other Ambulatory Visit: Payer: Self-pay | Admitting: Physician Assistant

## 2019-11-24 DIAGNOSIS — M0609 Rheumatoid arthritis without rheumatoid factor, multiple sites: Secondary | ICD-10-CM

## 2019-11-24 NOTE — Telephone Encounter (Signed)
Last Visit: 09/18/2019 Next Visit: 02/18/2020 Labs: 11/16/2019 CBC WNL. Creatinine is borderline elevated-1.03 but is improving. GFR is WNL-62. Please advise the patient to avoid taking NSAIDs. She is prescribed lasix which may be contributing to elevated creatinine. Sodium and chloride are borderline elevated. Alk phos is slightly elevated but stable. Rest of CMP WNL.  We will continue to monitor.  TB Gold: 11/16/2019 Negative  Current Dose per office note 09/18/2019: Humira 40 mg sq injections every 14 day  DX: Rheumatoid arthritis of multiple sites with negative rheumatoid factor  Okay to refill Humira?

## 2019-11-25 ENCOUNTER — Ambulatory Visit: Payer: Medicare PPO | Admitting: Physical Therapy

## 2019-11-25 ENCOUNTER — Other Ambulatory Visit: Payer: Self-pay

## 2019-11-25 DIAGNOSIS — R2681 Unsteadiness on feet: Secondary | ICD-10-CM | POA: Diagnosis not present

## 2019-11-25 DIAGNOSIS — R296 Repeated falls: Secondary | ICD-10-CM

## 2019-11-25 NOTE — Therapy (Signed)
[facility] [place], [place], [address] Phone: [phone]   Fax:  [phone]  Physical Therapy Treatment  Patient Details  Name: [name] MRN: [id number] Date of Birth: 08/08/1945 Referring Provider (PT): Vivianne Spence, MD   Encounter Date: 11/25/2019   PT End of Session - 11/25/19 0834    Visit Number 9    Number of Visits 12    Date for PT Re-Evaluation 12/14/19    PT Start Time 0815    PT Stop Time 0851    PT Time Calculation (min) 36 min    Activity Tolerance Patient tolerated treatment well    Behavior During Therapy [facility] for tasks assessed/performed           Past Medical History:  Diagnosis Date  . Anemia   . Arthritis    Rheumatoid, followed by Dr. Estanislado Pandy, knees & ankles   . CHF (congestive heart failure) ([place])    pt not aware of this  . Constipation   . GERD (gastroesophageal reflux disease)   . H/O cardiovascular stress test 02/2014   seen by Dr. Einar Gip - told that she was cleared for surgery  . Heart murmur    states she's never had any problems  . Hypertension   . Leg cramps   . Shortness of breath dyspnea    with exertion  . Sleep apnea    CPAP in use- QO night, study done at [facility]- 2007   . Stroke [facility]) 03/2015   TIA    Past Surgical History:  Procedure Laterality Date  . ABDOMINAL HYSTERECTOMY    . CARPAL TUNNEL RELEASE Right   . COLONOSCOPY    . COLONOSCOPY WITH PROPOFOL N/A 11/13/2019   Procedure: COLONOSCOPY WITH PROPOFOL;  Surgeon: Carol Ada, MD;  [facility];  Service: Endoscopy;  Laterality: N/A;  . ESOPHAGOGASTRODUODENOSCOPY (EGD) WITH PROPOFOL N/A 11/13/2019   Procedure: ESOPHAGOGASTRODUODENOSCOPY (EGD) WITH PROPOFOL;  Surgeon: Carol Ada, MD;  [facility];  Service: Endoscopy;  Laterality: N/A;  . FOOT SURGERY Bilateral    bunionectomy, calluses , also has several pins & screws  . HAND SURGERY    . JOINT REPLACEMENT     planned for  03/25/2014  . KNEE ARTHROPLASTY    . KNEE SURGERY  1990's   . LUMBAR LAMINECTOMY/DECOMPRESSION MICRODISCECTOMY N/A 01/17/2015   Procedure: L2-3, L3-4 Decompression;  Surgeon: [name], MD;  Location: [place];  Service: Orthopedics;  Laterality: N/A;  . LUMBAR LAMINECTOMY/DECOMPRESSION MICRODISCECTOMY N/A 02/06/2015   Procedure: I AND D LUMBAR WITH WOUND VAC PLACEMENT;  Surgeon: [name], MD;  Location: [place];  Service: Orthopedics;  Laterality: N/A;  . ORIF TOE FRACTURE Right 07/30/2016   Procedure: OPEN REDUCTION INTERNAL FIXATION (ORIF) RIGHT 2ND METATARSAL (TOE) FRACTURE;  Surgeon: [name], MD;  Location: [place];  Service: Orthopedics;  Laterality: Right;  . POLYPECTOMY  11/13/2019   Procedure: POLYPECTOMY;  Surgeon: Carol Ada, MD;  [facility];  Service: Endoscopy;;  . TOTAL KNEE ARTHROPLASTY Left 03/25/2014   Procedure: TOTAL KNEE ARTHROPLASTY;  Surgeon: Garald Balding, MD;  Location: [place];  Service: Orthopedics;  Laterality: Left;  . TUBAL LIGATION      There were no vitals filed for this visit.   Subjective Assessment - 11/25/19 0828    Subjective COVID-19 screen performed prior to patient entering clinic. Doing okay.    Pertinent History Arthrits, HTN, history of lumbar surgery and knee replacement    Limitations  House hold activities                             [facility] Adult PT Treatment/Exercise - 11/25/19 0001      Exercises   Exercises Knee/Hip      Knee/Hip Exercises: Aerobic   Nustep Level 4 x 15 minutes.      Knee/Hip Exercises: Machines for Strengthening   Cybex Knee Extension 10# x 2 minutes.    Cybex Knee Flexion 30# x 2 minutes.      Knee/Hip Exercises: Standing   Other Standing Knee Exercises Rockerboard in parallel bars x 5 minutes.    Other Standing Knee Exercises Airex balance pad in parallel bars x 5 minutes.                       PT Long Term Goals - 11/16/19 0904      PT LONG TERM GOAL #1    Title Patient will be independent with HEP    Time 6    Period Weeks    Status On-going      PT LONG TERM GOAL #2   Title Patient will demonstrate 4+/5  or greater bilateral LE MMT to improve stability during functional tasks.    Time 6    Period Weeks    Status On-going      PT LONG TERM GOAL #3   Title Patient will demonstrate a 4+ point improvement on Berg Balance Scale to decrease risk of falls.    Time 6    Period Weeks    Status On-going      PT LONG TERM GOAL #4   Title Patient will perform 5x sit to stand test in 13 seconds or less with no UE support to improve balance and improve functional LE strength.    Baseline 4x 13 seconds 11/02/19    Time 6    Period Weeks    Status Achieved                 Plan - 11/25/19 7989    Clinical Impression Statement Patient did well with balanace activtes today.  Added Airex balance pads which she could maintain her balance with just a few touches with hands on the parallel bars.    Personal Factors and Comorbidities Comorbidity 2;Time since onset of injury/illness/exacerbation    Comorbidities Arthrits, HTN, history of lumbar surgery and knee replacement    Examination-Activity Limitations Stairs;Stand;Locomotion Level    Examination-Participation Restrictions Cleaning    Rehab Potential Good    PT Frequency 2x / week    PT Duration 6 weeks    PT Treatment/Interventions ADLs/Self Care Home Management;Gait training;Stair training;Functional mobility training;Therapeutic activities;Passive range of motion;Patient/family education;Therapeutic exercise;Balance training;Neuromuscular re-education;Manual techniques    PT Next Visit Plan cont with POC LE strengthening, balance activities as tolerated.    PT Home Exercise Plan see patient education section    Consulted and Agree with Plan of Care Patient           Patient will benefit from skilled therapeutic intervention in order to improve the following deficits and  impairments:  Abnormal gait, Difficulty walking, Decreased balance, Decreased activity tolerance, Decreased strength, Decreased mobility  Visit Diagnosis: Unsteadiness on feet  Repeated falls     Problem List Patient Active Problem List   Diagnosis Date Noted  . Unilateral primary osteoarthritis, right knee 11/18/2018  . Closed fracture of shaft of metatarsal bone  07/30/2016  . Metatarsal fracture 07/30/2016  . Displaced fracture of second metatarsal bone, right foot, initial encounter for closed fracture 07/26/2016  . Arthritis of shoulder 04/10/2016  . High risk medication use 03/16/2016  . Primary osteoarthritis of both feet 03/16/2016  . Primary osteoarthritis of both knees 03/16/2016  . DDD (degenerative disc disease), lumbar 03/16/2016  . OSA on CPAP 07/27/2015  . Lip numbness 03/19/2015  . TIA (transient ischemic attack) 03/19/2015  . 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[place]) 02/06/2015  . Post op infection 02/05/2015  . History of lumbar laminectomy for spinal cord decompression 01/17/2015  . Lumbar spinal stenosis 01/17/2015  . Primary osteoarthritis of left knee 03/25/2014  . 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 History of total knee replacement, left 03/25/2014  . DERMATITIS 09/28/2008  . CONSTIPATION 05/25/2008  . DYSPNEA ON EXERTION 05/25/2008  . ANEMIA, NORMOCYTIC 04/13/2008  . ANKLE PAIN, BILATERAL 11/25/2007  . HLD (hyperlipidemia) 08/14/2006  . Morbid (severe) obesity due to excess calories ([place]) 12/13/2005  . CARPAL TUNNEL SYNDROME 12/13/2005  . PERIPHERAL NEUROPATHY 12/13/2005  . Essential hypertension 12/13/2005  . DYSRHYTHMIA, CARDIAC NOS 12/13/2005    Makailah Slavick, [place] MPT 11/25/2019, 9:15 AM  [facility] [address] [place], [place], [address] Phone: [phone]   Fax:  [phone]  Name: NAWAL BURLING MRN: [id number] Date of Birth: 25-Aug-1945

## 2019-11-26 MED FILL — HUMIRA PEN 40 MG/0.4ML PNKT: 40 | 28 days supply | Qty: 2 | Fill #0

## 2019-11-30 ENCOUNTER — Encounter: Payer: Self-pay | Admitting: Physical Therapy

## 2019-11-30 ENCOUNTER — Ambulatory Visit: Payer: Medicare PPO | Admitting: Physical Therapy

## 2019-11-30 ENCOUNTER — Other Ambulatory Visit: Payer: Self-pay

## 2019-11-30 DIAGNOSIS — R2681 Unsteadiness on feet: Secondary | ICD-10-CM | POA: Diagnosis not present

## 2019-11-30 DIAGNOSIS — R296 Repeated falls: Secondary | ICD-10-CM

## 2019-11-30 NOTE — Therapy (Signed)
[facility] [place], [place], [address] Phone: [phone]   Fax:  [phone]  Physical Therapy Treatment  Progress Note Reporting Period 10/26/2019 to 11/30/2019  See note below for Objective Data and Assessment of Progress/Goals. Patient doing fairly well with goals ongoing at this time. Gabriela Eves, PT, DPT     Patient Details  Name: [name] MRN: [id number] Date of Birth: [birth year] Referring Provider (PT): Vivianne Spence, MD   Encounter Date: 11/30/2019   PT End of Session - 11/30/19 0830    Visit Number 10    Number of Visits 12    Date for PT Re-Evaluation 12/14/19    PT Start Time 0816    PT Stop Time 0859    PT Time Calculation (min) 43 min    Equipment Utilized During Treatment Other (comment)   SPC   Activity Tolerance Patient tolerated treatment well    Behavior During Therapy [facility] for tasks assessed/performed           Past Medical History:  Diagnosis Date  . Anemia   . Arthritis    Rheumatoid, followed by Dr. Estanislado Pandy, knees & ankles   . CHF (congestive heart failure) ([place])    pt not aware of this  . Constipation   . GERD (gastroesophageal reflux disease)   . H/O cardiovascular stress test 02/2014   seen by Dr. Einar Gip - told that she was cleared for surgery  . Heart murmur    states she's never had any problems  . Hypertension   . Leg cramps   . Shortness of breath dyspnea    with exertion  . Sleep apnea    CPAP in use- QO night, study done at [facility]- 2007   . Stroke [facility]) 03/2015   TIA    Past Surgical History:  Procedure Laterality Date  . ABDOMINAL HYSTERECTOMY    . CARPAL TUNNEL RELEASE Right   . COLONOSCOPY    . COLONOSCOPY WITH PROPOFOL N/A 11/13/2019   Procedure: COLONOSCOPY WITH PROPOFOL;  Surgeon: Carol Ada, MD;  [facility];  Service: Endoscopy;  Laterality: N/A;  . ESOPHAGOGASTRODUODENOSCOPY (EGD) WITH PROPOFOL N/A 11/13/2019   Procedure:  ESOPHAGOGASTRODUODENOSCOPY (EGD) WITH PROPOFOL;  Surgeon: Carol Ada, MD;  [facility];  Service: Endoscopy;  Laterality: N/A;  . FOOT SURGERY Bilateral    bunionectomy, calluses , also has several pins & screws  . HAND SURGERY    . JOINT REPLACEMENT     planned for 03/25/2014  . KNEE ARTHROPLASTY    . KNEE SURGERY  1990's   . LUMBAR LAMINECTOMY/DECOMPRESSION MICRODISCECTOMY N/A 01/17/2015   Procedure: L2-3, L3-4 Decompression;  Surgeon: [name], MD;  Location: [place];  Service: Orthopedics;  Laterality: N/A;  . LUMBAR LAMINECTOMY/DECOMPRESSION MICRODISCECTOMY N/A 02/06/2015   Procedure: I AND D LUMBAR WITH WOUND VAC PLACEMENT;  Surgeon: [name], MD;  Location: [place];  Service: Orthopedics;  Laterality: N/A;  . ORIF TOE FRACTURE Right 07/30/2016   Procedure: OPEN REDUCTION INTERNAL FIXATION (ORIF) RIGHT 2ND METATARSAL (TOE) FRACTURE;  Surgeon: [name], MD;  Location: [place];  Service: Orthopedics;  Laterality: Right;  . POLYPECTOMY  11/13/2019   Procedure: POLYPECTOMY;  Surgeon: Carol Ada, MD;  [facility];  Service: Endoscopy;;  . TOTAL KNEE ARTHROPLASTY Left 03/25/2014   Procedure: TOTAL KNEE ARTHROPLASTY;  Surgeon: Garald Balding, MD;  Location: [place];  Service: Orthopedics;  Laterality: Left;  . TUBAL LIGATION  There were no vitals filed for this visit.   Subjective Assessment - 11/30/19 0829    Subjective COVID-19 screen performed prior to patient entering clinic. Doing okay.    Pertinent History Arthrits, HTN, history of lumbar surgery and knee replacement    Limitations House hold activities    Currently in Pain? No/denies              [facility] PT Assessment - 11/30/19 0001      Assessment   Medical Diagnosis Abnormal gait    Referring Provider (PT) Vivianne Spence, MD    Next MD Visit "3 to 6 months"    Prior Therapy no`      Precautions   Precautions Fall      Restrictions   Weight Bearing Restrictions No                          [facility] Adult PT Treatment/Exercise - 11/30/19 0001      Knee/Hip Exercises: Aerobic   Nustep Level 3 x 15 minutes.      Knee/Hip Exercises: Machines for Strengthening   Cybex Knee Extension 10# 3x10 reps    Cybex Knee Flexion 30# 3x10 reps      Knee/Hip Exercises: Standing   Hip Abduction Stengthening;Both;20 reps;Knee straight;Limitations    Abduction Limitations red theraband    Hip Extension Stengthening;Both;20 reps;Knee straight;Limitations    Extension Limitations red theraband      Knee/Hip Exercises: Seated   Clamshell with TheraBand Red   x20 reps   Sit to Sand 15 reps;without UE support   red theraband              Balance Exercises - 11/30/19 0001      Balance Exercises: Standing   Standing Eyes Opened Narrow base of support (BOS);Foam/compliant surface;Time;Head turns    Standing Eyes Opened Time x3 min    Standing Eyes Closed Narrow base of support (BOS);Foam/compliant surface;Time    Standing Eyes Closed Time x3 min    Standing, One Foot on a Step Eyes open;4 inch;2 reps;30 secs    Sidestepping Foam/compliant support;5 reps                  PT Long Term Goals - 11/16/19 0904      PT LONG TERM GOAL #1   Title Patient will be independent with HEP    Time 6    Period Weeks    Status On-going      PT LONG TERM GOAL #2   Title Patient will demonstrate 4+/5  or greater bilateral LE MMT to improve stability during functional tasks.    Time 6    Period Weeks    Status On-going      PT LONG TERM GOAL #3   Title Patient will demonstrate a 4+ point improvement on Berg Balance Scale to decrease risk of falls.    Time 6    Period Weeks    Status On-going      PT LONG TERM GOAL #4   Title Patient will perform 5x sit to stand test in 13 seconds or less with no UE support to improve balance and improve functional LE strength.    Baseline 4x 13 seconds 11/02/19    Time 6    Period Weeks    Status Achieved                  Plan - 11/30/19 0930    Clinical Impression  Statement Patient presented in clinic with reports of some improvements noted at home. Patient still reports near LOB when her cane hit a chair. Patient progressed to more resisted LE strengthening. Patient also progressed to more activities on uneven surface with increased ankle activity noted. Patient fatigued at end of treatment.    Personal Factors and Comorbidities Comorbidity 2;Time since onset of injury/illness/exacerbation    Comorbidities Arthrits, HTN, history of lumbar surgery and knee replacement    Examination-Activity Limitations Stairs;Stand;Locomotion Level    Examination-Participation Restrictions Cleaning    Stability/Clinical Decision Making Stable/Uncomplicated    Rehab Potential Good    PT Frequency 2x / week    PT Duration 6 weeks    PT Treatment/Interventions ADLs/Self Care Home Management;Gait training;Stair training;Functional mobility training;Therapeutic activities;Passive range of motion;Patient/family education;Therapeutic exercise;Balance training;Neuromuscular re-education;Manual techniques    PT Next Visit Plan cont with POC LE strengthening, balance activities as tolerated.    PT Home Exercise Plan see patient education section    Consulted and Agree with Plan of Care Patient           Patient will benefit from skilled therapeutic intervention in order to improve the following deficits and impairments:  Abnormal gait, Difficulty walking, Decreased balance, Decreased activity tolerance, Decreased strength, Decreased mobility  Visit Diagnosis: Unsteadiness on feet  Repeated falls     Problem List Patient Active Problem List   Diagnosis Date Noted  . Unilateral primary osteoarthritis, right knee 11/18/2018  . Closed fracture of shaft of metatarsal bone 07/30/2016  . Metatarsal fracture 07/30/2016  . Displaced fracture of second metatarsal bone, right foot, initial encounter for closed  fracture 07/26/2016  . Arthritis of shoulder 04/10/2016  . High risk medication use 03/16/2016  . Primary osteoarthritis of both feet 03/16/2016  . Primary osteoarthritis of both knees 03/16/2016  . DDD (degenerative disc disease), lumbar 03/16/2016  . OSA on CPAP 07/27/2015  . Lip numbness 03/19/2015  . TIA (transient ischemic attack) 03/19/2015  . 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 Post op infection 02/05/2015  . History of lumbar laminectomy for spinal cord decompression 01/17/2015  . Lumbar spinal stenosis 01/17/2015  . Primary osteoarthritis of left knee 03/25/2014  . Rheumatoid arthritis of multiple sites with negative rheumatoid factor ([place]) 03/25/2014  . History of total knee replacement, left 03/25/2014  . DERMATITIS 09/28/2008  . CONSTIPATION 05/25/2008  . DYSPNEA ON EXERTION 05/25/2008  . ANEMIA, NORMOCYTIC 04/13/2008  . ANKLE PAIN, BILATERAL 11/25/2007  . HLD (hyperlipidemia) 08/14/2006  . Morbid (severe) obesity due to excess calories ([place]) 12/13/2005  . CARPAL TUNNEL SYNDROME 12/13/2005  . PERIPHERAL NEUROPATHY 12/13/2005  . Essential hypertension 12/13/2005  . DYSRHYTHMIA, CARDIAC NOS 12/13/2005    Standley Brooking, PTA 11/30/2019, 9:33 AM  [facility] [address] [place], [place], [address] Phone: [phone]   Fax:  [phone]  Name: [name] MRN: [id number] Date of Birth: 10-Feb-1945

## 2019-12-02 ENCOUNTER — Ambulatory Visit: Payer: Medicare PPO | Admitting: Physical Therapy

## 2019-12-02 ENCOUNTER — Other Ambulatory Visit: Payer: Self-pay

## 2019-12-02 DIAGNOSIS — R2681 Unsteadiness on feet: Secondary | ICD-10-CM

## 2019-12-02 DIAGNOSIS — R296 Repeated falls: Secondary | ICD-10-CM

## 2019-12-02 NOTE — Patient Instructions (Signed)
  Lower Body: Toe Rise   Standing, place feet apart. Hold arms out for balance or use support. Rise up on toes. Hold _3___ seconds, then lower. Repeat immediately. Repeat __10__ times. Do __2__ sessions per day.   Half Squat to Chair   Stand with feet shoulder width apart. Push buttocks backward and lower slowly, sitting in chair lightly and returning to standing position. Complete _2_ sets of 10_ repetitions. Perform __2-3_ sessions per day.     Hip abduction   While sitting with good posture, tie theraband around knees and pull apart. Slowly resume starting position. x30 1-2 x day      SEATED MARCHING - ELASTIC BAND  Start by sitting in a chair with an elastic band wrapped around your lower thighs.  Next, move a knee upward, set it back down and then alternate to the other side. Perform 10-20 1-2x daily   Strengthening: Hip Abduction - Resisted   With tubing around right leg, other side toward anchor, extend leg out from side. Repeat __10__ times per set. Do __2__ sets per session. Do __2__ sessions per day.   Strengthening: Hip Extension - Resisted   With tubing around right ankle, face anchor and pull leg straight back. Repeat __10__ times per set. Do _2___ sets per session. Do __2__ sessions per day.

## 2019-12-02 NOTE — Therapy (Signed)
[facility] [place], [place], [address] Phone: [phone]   Fax:  [phone]  Physical Therapy Treatment  Patient Details  Name: [name] MRN: [id number] Date of Birth: 1945/01/27 Referring Provider (PT): Vivianne Spence, MD   Encounter Date: 12/02/2019   PT End of Session - 12/02/19 0820    Visit Number 11    Number of Visits 12    Date for PT Re-Evaluation 12/14/19    PT Start Time 0815    PT Stop Time 2876    PT Time Calculation (min) 42 min    Activity Tolerance Patient tolerated treatment well    Behavior During Therapy [facility] for tasks assessed/performed           Past Medical History:  Diagnosis Date  . Anemia   . Arthritis    Rheumatoid, followed by Dr. Estanislado Pandy, knees & ankles   . CHF (congestive heart failure) ([place])    pt not aware of this  . Constipation   . GERD (gastroesophageal reflux disease)   . H/O cardiovascular stress test 02/2014   seen by Dr. Einar Gip - told that she was cleared for surgery  . Heart murmur    states she's never had any problems  . Hypertension   . Leg cramps   . Shortness of breath dyspnea    with exertion  . Sleep apnea    CPAP in use- QO night, study done at [facility]- 2007   . Stroke [facility]) 03/2015   TIA    Past Surgical History:  Procedure Laterality Date  . ABDOMINAL HYSTERECTOMY    . CARPAL TUNNEL RELEASE Right   . COLONOSCOPY    . COLONOSCOPY WITH PROPOFOL N/A 11/13/2019   Procedure: COLONOSCOPY WITH PROPOFOL;  Surgeon: Carol Ada, MD;  [facility];  Service: Endoscopy;  Laterality: N/A;  . ESOPHAGOGASTRODUODENOSCOPY (EGD) WITH PROPOFOL N/A 11/13/2019   Procedure: ESOPHAGOGASTRODUODENOSCOPY (EGD) WITH PROPOFOL;  Surgeon: Carol Ada, MD;  [facility];  Service: Endoscopy;  Laterality: N/A;  . FOOT SURGERY Bilateral    bunionectomy, calluses , also has several pins & screws  . HAND SURGERY    . JOINT REPLACEMENT     planned for  03/25/2014  . KNEE ARTHROPLASTY    . KNEE SURGERY  1990's   . LUMBAR LAMINECTOMY/DECOMPRESSION MICRODISCECTOMY N/A 01/17/2015   Procedure: L2-3, L3-4 Decompression;  Surgeon: [name], MD;  Location: [place];  Service: Orthopedics;  Laterality: N/A;  . LUMBAR LAMINECTOMY/DECOMPRESSION MICRODISCECTOMY N/A 02/06/2015   Procedure: I AND D LUMBAR WITH WOUND VAC PLACEMENT;  Surgeon: [name], MD;  Location: [place];  Service: Orthopedics;  Laterality: N/A;  . ORIF TOE FRACTURE Right 07/30/2016   Procedure: OPEN REDUCTION INTERNAL FIXATION (ORIF) RIGHT 2ND METATARSAL (TOE) FRACTURE;  Surgeon: [name], MD;  Location: [place];  Service: Orthopedics;  Laterality: Right;  . POLYPECTOMY  11/13/2019   Procedure: POLYPECTOMY;  Surgeon: Carol Ada, MD;  [facility];  Service: Endoscopy;;  . TOTAL KNEE ARTHROPLASTY Left 03/25/2014   Procedure: TOTAL KNEE ARTHROPLASTY;  Surgeon: Garald Balding, MD;  Location: [place];  Service: Orthopedics;  Laterality: Left;  . TUBAL LIGATION      There were no vitals filed for this visit.   Subjective Assessment - 12/02/19 0816    Subjective COVID-19 screen performed prior to patient entering clinic. No new complaints, doing well.    Pertinent History Arthrits, HTN, history of lumbar surgery and knee replacement  Limitations House hold activities    Currently in Pain? No/denies              [facility] PT Assessment - 12/02/19 0001      Strength   Right Hip Flexion 4+/5    Right Hip ABduction 4+/5    Right Hip ADduction 4+/5    Left Hip Flexion 4+/5    Right Knee Flexion 4+/5    Right Knee Extension 4+/5    Left Knee Flexion 4+/5    Left Knee Extension 4+/5                         [facility] Adult PT Treatment/Exercise - 12/02/19 0001      Berg Balance Test   Sit to Stand Able to stand without using hands and stabilize independently    Standing Unsupported Able to stand safely 2 minutes    Sitting with Back Unsupported but Feet  Supported on Floor or Stool Able to sit safely and securely 2 minutes    Stand to Sit Sits safely with minimal use of hands    Transfers Able to transfer safely, minor use of hands    Standing Unsupported with Eyes Closed Able to stand 10 seconds safely    Standing Ubsupported with Feet Together Able to place feet together independently and stand 1 minute safely    From Standing, Reach Forward with Outstretched Arm Can reach confidently >25 cm (10")    From Standing Position, Pick up Object from Floor Able to pick up shoe safely and easily    From Standing Position, Turn to Look Behind Over each Shoulder Looks behind from both sides and weight shifts well    Turn 360 Degrees Able to turn 360 degrees safely one side only in 4 seconds or less    Standing Unsupported, Alternately Place Feet on Step/Stool Able to complete >2 steps/needs minimal assist    Standing Unsupported, One Foot in Front Able to take small step independently and hold 30 seconds    Standing on One Leg Tries to lift leg/unable to hold 3 seconds but remains standing independently    Total Score 47      Knee/Hip Exercises: Aerobic   Nustep Level 3 x 15 minutes.      Knee/Hip Exercises: Machines for Strengthening   Cybex Knee Extension 10# 3x10 reps    Cybex Knee Flexion 30# 3x10 reps      Knee/Hip Exercises: Standing   Heel Raises Both;20 reps    Hip Abduction Stengthening;Both;20 reps;Knee straight;Limitations    Abduction Limitations red theraband    Hip Extension Stengthening;Both;20 reps;Knee straight;Limitations    Extension Limitations red theraband    Other Standing Knee Exercises Rockerboard in parallel bars x 4 minutes.    Other Standing Knee Exercises marching in bars 2x10      Knee/Hip Exercises: Seated   Clamshell with TheraBand Red   x30   Marching 20 reps;10 reps;Strengthening;Both   red band   Sit to Sand 10 reps;without UE support                  PT Education - 12/02/19 0826    Education  Details HEP progression    Person(s) Educated Patient    Methods Explanation;Demonstration;Handout    Comprehension Verbalized understanding;Returned demonstration               PT Long Term Goals - 12/02/19 0820      PT LONG TERM GOAL #  1   Title Patient will be independent with HEP    Baseline Met 12/02/19    Time 6    Period Weeks    Status Achieved      PT LONG TERM GOAL #2   Title Patient will demonstrate 4+/5  or greater bilateral LE MMT to improve stability during functional tasks.    Baseline Met 12/02/19    Time 6    Period Weeks    Status Achieved      PT LONG TERM GOAL #3   Title Patient will demonstrate a 4+ point improvement on Berg Balance Scale to decrease risk of falls.    Baseline Met 12/02/19 +6 points to 47/56    Time 6    Period Weeks    Status Achieved      PT LONG TERM GOAL #4   Title Patient will perform 5x sit to stand test in 13 seconds or less with no UE support to improve balance and improve functional LE strength.    Time 6    Period Weeks    Status Achieved                 Plan - 12/02/19 0846    Clinical Impression Statement Patient has met all current goals today. Patient has overall improved and reported she is able to perform ADL's with greater ease. Patient improved with bil LE strengthening and balance today. Patient issued HEP for home progression with red band. Patient educated on continued HEP to maintain strength and functional independence.    Personal Factors and Comorbidities Comorbidity 2;Time since onset of injury/illness/exacerbation    Comorbidities Arthrits, HTN, history of lumbar surgery and knee replacement    Examination-Activity Limitations Stairs;Stand;Locomotion Level    Examination-Participation Restrictions Cleaning    Stability/Clinical Decision Making Stable/Uncomplicated    Rehab Potential Good    PT Frequency 2x / week    PT Duration 6 weeks    PT Treatment/Interventions ADLs/Self Care Home  Management;Gait training;Stair training;Functional mobility training;Therapeutic activities;Passive range of motion;Patient/family education;Therapeutic exercise;Balance training;Neuromuscular re-education;Manual techniques    PT Next Visit Plan review HEP and DC    Consulted and Agree with Plan of Care Patient           Patient will benefit from skilled therapeutic intervention in order to improve the following deficits and impairments:  Abnormal gait, Difficulty walking, Decreased balance, Decreased activity tolerance, Decreased strength, Decreased mobility  Visit Diagnosis: Unsteadiness on feet  Repeated falls     Problem List Patient Active Problem List   Diagnosis Date Noted  . Unilateral primary osteoarthritis, right knee 11/18/2018  . Closed fracture of shaft of metatarsal bone 07/30/2016  . Metatarsal fracture 07/30/2016  . Displaced fracture of second metatarsal bone, right foot, initial encounter for closed fracture 07/26/2016  . Arthritis of shoulder 04/10/2016  . High risk medication use 03/16/2016  . Primary osteoarthritis of both feet 03/16/2016  . Primary osteoarthritis of both knees 03/16/2016  . DDD (degenerative disc disease), lumbar 03/16/2016  . OSA on CPAP 07/27/2015  . Lip numbness 03/19/2015  . TIA (transient ischemic attack) 03/19/2015  . 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 Post op infection 02/05/2015  . History of lumbar laminectomy for spinal cord decompression 01/17/2015  . Lumbar spinal stenosis 01/17/2015  . Primary osteoarthritis of left knee 03/25/2014  . Rheumatoid arthritis of multiple sites with negative rheumatoid factor ([place]) 03/25/2014  . History of total knee replacement, left 03/25/2014  . DERMATITIS 09/28/2008  .  CONSTIPATION 05/25/2008  . DYSPNEA ON EXERTION 05/25/2008  . ANEMIA, NORMOCYTIC 04/13/2008  . ANKLE PAIN, BILATERAL 11/25/2007  . HLD (hyperlipidemia) 08/14/2006  . Morbid (severe) obesity due to excess calories  ([place]) 12/13/2005  . CARPAL TUNNEL SYNDROME 12/13/2005  . PERIPHERAL NEUROPATHY 12/13/2005  . Essential hypertension 12/13/2005  . DYSRHYTHMIA, CARDIAC NOS 12/13/2005    Phillips Climes, PTA 12/02/2019, 8:59 AM  [facility] [place], [place], [address] Phone: [phone]   Fax:  [phone]  Name: MUNACHIMSO RIGDON MRN: [id number] Date of Birth: 1945/04/05

## 2019-12-09 ENCOUNTER — Ambulatory Visit: Payer: Medicare PPO | Attending: Family Medicine | Admitting: Physical Therapy

## 2019-12-09 ENCOUNTER — Other Ambulatory Visit: Payer: Self-pay | Admitting: Physician Assistant

## 2019-12-09 ENCOUNTER — Encounter: Payer: Self-pay | Admitting: Physical Therapy

## 2019-12-09 ENCOUNTER — Other Ambulatory Visit (HOSPITAL_COMMUNITY): Payer: Self-pay | Admitting: Family Medicine

## 2019-12-09 ENCOUNTER — Other Ambulatory Visit: Payer: Self-pay

## 2019-12-09 DIAGNOSIS — R296 Repeated falls: Secondary | ICD-10-CM | POA: Diagnosis present

## 2019-12-09 DIAGNOSIS — R2681 Unsteadiness on feet: Secondary | ICD-10-CM | POA: Insufficient documentation

## 2019-12-09 DIAGNOSIS — Z1231 Encounter for screening mammogram for malignant neoplasm of breast: Secondary | ICD-10-CM

## 2019-12-09 NOTE — Therapy (Signed)
[facility] [place], [place], [address] Phone: [phone]   Fax:  [phone]  Physical Therapy Treatment PHYSICAL THERAPY DISCHARGE SUMMARY  Visits from Start of Care: 12  Current functional level related to goals / functional outcomes: See below   Remaining deficits: See goals   Education / Equipment: HEP Plan: Patient agrees to discharge.  Patient goals were met. Patient is being discharged due to meeting the stated rehab goals.  ?????     Patient Details  Name: [name] MRN: [id number] Date of Birth: 02-10-1945 Referring Provider (PT): Vivianne Spence, MD   Encounter Date: 12/09/2019   PT End of Session - 12/09/19 0748    Visit Number 12    Number of Visits 12    Date for PT Re-Evaluation 12/14/19    PT Start Time 0743   late arrival   PT Stop Time 0824    PT Time Calculation (min) 41 min    Equipment Utilized During Treatment Other (comment)   SPC   Activity Tolerance Patient tolerated treatment well    Behavior During Therapy [facility] for tasks assessed/performed           Past Medical History:  Diagnosis Date  . Anemia   . Arthritis    Rheumatoid, followed by Dr. Estanislado Pandy, knees & ankles   . CHF (congestive heart failure) ([place])    pt not aware of this  . Constipation   . GERD (gastroesophageal reflux disease)   . H/O cardiovascular stress test 02/2014   seen by Dr. Einar Gip - told that she was cleared for surgery  . Heart murmur    states she's never had any problems  . Hypertension   . Leg cramps   . Shortness of breath dyspnea    with exertion  . Sleep apnea    CPAP in use- QO night, study done at [facility]- 2007   . Stroke [facility]) 03/2015   TIA    Past Surgical History:  Procedure Laterality Date  . ABDOMINAL HYSTERECTOMY    . CARPAL TUNNEL RELEASE Right   . COLONOSCOPY    . COLONOSCOPY WITH PROPOFOL N/A 11/13/2019   Procedure: COLONOSCOPY WITH PROPOFOL;  Surgeon: Carol Ada, MD;   Location: [facility];  Service: Endoscopy;  Laterality: N/A;  . ESOPHAGOGASTRODUODENOSCOPY (EGD) WITH PROPOFOL N/A 11/13/2019   Procedure: ESOPHAGOGASTRODUODENOSCOPY (EGD) WITH PROPOFOL;  Surgeon: Carol Ada, MD;  Location: [facility];  Service: Endoscopy;  Laterality: N/A;  . FOOT SURGERY Bilateral    bunionectomy, calluses , also has several pins & screws  . HAND SURGERY    . JOINT REPLACEMENT     planned for 03/25/2014  . KNEE ARTHROPLASTY    . KNEE SURGERY  1990's   . LUMBAR LAMINECTOMY/DECOMPRESSION MICRODISCECTOMY N/A 01/17/2015   Procedure: L2-3, L3-4 Decompression;  Surgeon: [name], MD;  Location: [place];  Service: Orthopedics;  Laterality: N/A;  . LUMBAR LAMINECTOMY/DECOMPRESSION MICRODISCECTOMY N/A 02/06/2015   Procedure: I AND D LUMBAR WITH WOUND VAC PLACEMENT;  Surgeon: [name], MD;  Location: [place];  Service: Orthopedics;  Laterality: N/A;  . ORIF TOE FRACTURE Right 07/30/2016   Procedure: OPEN REDUCTION INTERNAL FIXATION (ORIF) RIGHT 2ND METATARSAL (TOE) FRACTURE;  Surgeon: [name], MD;  Location: [place];  Service: Orthopedics;  Laterality: Right;  . POLYPECTOMY  11/13/2019   Procedure: POLYPECTOMY;  Surgeon: Carol Ada, MD;  Location: [facility];  Service: Endoscopy;;  . TOTAL KNEE ARTHROPLASTY Left 03/25/2014   Procedure:  TOTAL KNEE ARTHROPLASTY;  Surgeon: Garald Balding, MD;  Location: [place];  Service: Orthopedics;  Laterality: Left;  . TUBAL LIGATION      There were no vitals filed for this visit.   Subjective Assessment - 12/09/19 0749    Subjective COVID-19 screen performed upon arrival. Patient arrives doing well.    Pertinent History Arthrits, HTN, history of lumbar surgery and knee replacement    Limitations House hold activities    Currently in Pain? No/denies              [facility] PT Assessment - 12/09/19 0001      Assessment   Medical Diagnosis Abnormal gait    Referring Provider (PT) Vivianne Spence, MD    Next MD Visit "3 to 6  months"    Prior Therapy no`      Precautions   Precautions Fall      Restrictions   Weight Bearing Restrictions No                         [facility] Adult PT Treatment/Exercise - 12/09/19 0001      Knee/Hip Exercises: Aerobic   Nustep Level 3 x10 mins with UE support      Knee/Hip Exercises: Machines for Strengthening   Cybex Knee Extension 10# 3x10 reps    Cybex Knee Flexion 30# 3x10 reps      Knee/Hip Exercises: Standing   Heel Raises Both;20 reps    Heel Raises Limitations B toe raise x20 reps    Knee Flexion AROM;Both;20 reps    Hip Abduction Both;20 reps;Knee straight;Limitations;AROM    Hip Extension Both;20 reps;Knee straight;Limitations;AROM      Knee/Hip Exercises: Seated   Marching 20 reps;Strengthening;Both    Marching Limitations red theraband                  PT Education - 12/09/19 0833    Education Details balance: tandem and SLS and how to regress exercises.    Person(s) Educated Patient    Methods Explanation;Handout    Comprehension Verbalized understanding               PT Long Term Goals - 12/02/19 0820      PT LONG TERM GOAL #1   Title Patient will be independent with HEP    Baseline Met 12/02/19    Time 6    Period Weeks    Status Achieved      PT LONG TERM GOAL #2   Title Patient will demonstrate 4+/5  or greater bilateral LE MMT to improve stability during functional tasks.    Baseline Met 12/02/19    Time 6    Period Weeks    Status Achieved      PT LONG TERM GOAL #3   Title Patient will demonstrate a 4+ point improvement on Berg Balance Scale to decrease risk of falls.    Baseline Met 12/02/19 +6 points to 47/56    Time 6    Period Weeks    Status Achieved      PT LONG TERM GOAL #4   Title Patient will perform 5x sit to stand test in 13 seconds or less with no UE support to improve balance and improve functional LE strength.    Time 6    Period Weeks    Status Achieved                 Plan  - 12/09/19 7673  Clinical Impression Statement Patient tolerated treatment well with all TEs today. Patient and PT reviewed HEP and discussed how to progress and regress exercises as needed. Patient reported understanding. Patient will be discharged today with all goals met and improvements in ADLs and la    Personal Factors and Comorbidities Comorbidity 2;Time since onset of injury/illness/exacerbation    Comorbidities Arthrits, HTN, history of lumbar surgery and knee replacement    Examination-Activity Limitations Stairs;Stand;Locomotion Level    Examination-Participation Restrictions Cleaning    Stability/Clinical Decision Making Stable/Uncomplicated    Clinical Decision Making Low    Rehab Potential Good    PT Frequency 2x / week    PT Duration 6 weeks    PT Treatment/Interventions ADLs/Self Care Home Management;Gait training;Stair training;Functional mobility training;Therapeutic activities;Passive range of motion;Patient/family education;Therapeutic exercise;Balance training;Neuromuscular re-education;Manual techniques    PT Next Visit Plan DC    PT Home Exercise Plan see patient education section    Consulted and Agree with Plan of Care Patient           Patient will benefit from skilled therapeutic intervention in order to improve the following deficits and impairments:  Abnormal gait, Difficulty walking, Decreased balance, Decreased activity tolerance, Decreased strength, Decreased mobility  Visit Diagnosis: Unsteadiness on feet  Repeated falls     Problem List Patient Active Problem List   Diagnosis Date Noted  . Unilateral primary osteoarthritis, right knee 11/18/2018  . Closed fracture of shaft of metatarsal bone 07/30/2016  . Metatarsal fracture 07/30/2016  . Displaced fracture of second metatarsal bone, right foot, initial encounter for closed fracture 07/26/2016  . Arthritis of shoulder 04/10/2016  . High risk medication use 03/16/2016  . Primary osteoarthritis  of both feet 03/16/2016  . Primary osteoarthritis of both knees 03/16/2016  . DDD (degenerative disc disease), lumbar 03/16/2016  . OSA on CPAP 07/27/2015  . Lip numbness 03/19/2015  . TIA (transient ischemic attack) 03/19/2015  . 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[place]) 02/06/2015  . Post op infection 02/05/2015  . History of lumbar laminectomy for spinal cord decompression 01/17/2015  . Lumbar spinal stenosis 01/17/2015  . Primary osteoarthritis of left knee 03/25/2014  . 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 History of total knee replacement, left 03/25/2014  . DERMATITIS 09/28/2008  . CONSTIPATION 05/25/2008  . DYSPNEA ON EXERTION 05/25/2008  . ANEMIA, NORMOCYTIC 04/13/2008  . ANKLE PAIN, BILATERAL 11/25/2007  . HLD (hyperlipidemia) 08/14/2006  . Morbid (severe) obesity due to excess calories ([place]) 12/13/2005  . CARPAL TUNNEL SYNDROME 12/13/2005  . PERIPHERAL NEUROPATHY 12/13/2005  . Essential hypertension 12/13/2005  . DYSRHYTHMIA, CARDIAC NOS 12/13/2005    Gabriela Eves, PT, DPT 12/09/2019, 8:34 AM  [facility] [address] [place], [place], [address] Phone: [phone]   Fax:  [phone]  Name: [name] MRN: [id number] Date of Birth: 1945-06-29

## 2019-12-09 NOTE — Telephone Encounter (Signed)
Last Visit: 09/18/2019 Next Visit: 02/18/2020 UDS: 08/06/2019 Narc Agreement: 08/06/2019  Last Fill: 11/09/2019  Okay to refill Tramadol?

## 2019-12-16 ENCOUNTER — Other Ambulatory Visit: Payer: Self-pay

## 2019-12-16 ENCOUNTER — Ambulatory Visit (HOSPITAL_COMMUNITY)
Admission: RE | Admit: 2019-12-16 | Discharge: 2019-12-16 | Disposition: A | Discharge status: Home / Self Care | Payer: Medicare PPO | Source: Ambulatory Visit | Attending: Family Medicine | Admitting: Family Medicine

## 2019-12-16 DIAGNOSIS — Z1231 Encounter for screening mammogram for malignant neoplasm of breast: Secondary | ICD-10-CM | POA: Diagnosis present

## 2019-12-21 ENCOUNTER — Other Ambulatory Visit (HOSPITAL_COMMUNITY): Payer: Self-pay | Admitting: Family Medicine

## 2019-12-21 DIAGNOSIS — R928 Other abnormal and inconclusive findings on diagnostic imaging of breast: Secondary | ICD-10-CM

## 2019-12-22 ENCOUNTER — Ambulatory Visit (HOSPITAL_COMMUNITY)
Admission: RE | Admit: 2019-12-22 | Discharge: 2019-12-22 | Disposition: A | Discharge status: Home / Self Care | Payer: Medicare PPO | Source: Ambulatory Visit | Attending: Family Medicine | Admitting: Family Medicine

## 2019-12-22 ENCOUNTER — Other Ambulatory Visit: Payer: Self-pay

## 2019-12-22 ENCOUNTER — Other Ambulatory Visit (HOSPITAL_COMMUNITY): Payer: Self-pay | Admitting: Family Medicine

## 2019-12-22 DIAGNOSIS — R928 Other abnormal and inconclusive findings on diagnostic imaging of breast: Secondary | ICD-10-CM

## 2019-12-24 ENCOUNTER — Telehealth: Payer: Self-pay | Admitting: Pharmacist

## 2019-12-24 NOTE — Telephone Encounter (Signed)
Received notification from [facility] regarding a prior authorization for HUMIRA. Authorization has been APPROVED from 01/09/20 to 01/07/21.   Phone # [phone]  Knox Saliva, PharmD, MPH Clinical Pharmacist (Rheumatology and Pulmonology)

## 2019-12-29 ENCOUNTER — Ambulatory Visit (HOSPITAL_COMMUNITY)
Admission: RE | Admit: 2019-12-29 | Discharge: 2019-12-29 | Disposition: A | Discharge status: Home / Self Care | Payer: Medicare PPO | Source: Ambulatory Visit | Attending: Family Medicine | Admitting: Family Medicine

## 2019-12-29 ENCOUNTER — Other Ambulatory Visit: Payer: Self-pay

## 2019-12-29 ENCOUNTER — Encounter (HOSPITAL_COMMUNITY): Payer: Self-pay

## 2019-12-29 ENCOUNTER — Other Ambulatory Visit (HOSPITAL_COMMUNITY): Payer: Self-pay | Admitting: Family Medicine

## 2019-12-29 DIAGNOSIS — R928 Other abnormal and inconclusive findings on diagnostic imaging of breast: Secondary | ICD-10-CM

## 2019-12-29 MED ORDER — LIDOCAINE HCL (PF) 2 % IJ SOLN
INTRAMUSCULAR | Status: AC
  Administered 2019-12-29: 13:00:00 20 mL
  Filled 2019-12-29: qty 20

## 2019-12-29 MED ORDER — LIDOCAINE-EPINEPHRINE (PF) 1 %-1:200000 IJ SOLN
INTRAMUSCULAR | Status: AC
  Administered 2019-12-29: 30 mL
  Filled 2019-12-29: qty 30

## 2019-12-29 MED ORDER — SODIUM BICARBONATE 4.2 % IV SOLN
INTRAVENOUS | Status: AC
  Administered 2019-12-29: 13:00:00 2.5 meq
  Filled 2019-12-29: qty 10

## 2019-12-29 NOTE — Sedation Documentation (Signed)
PT tolerated right breast biopsy well today with NAD noted. PT verbalized understanding of discharge instructions. PT ambulated back to the mammogram area this time. 

## 2019-12-29 NOTE — Discharge Instructions (Signed)
Breast Biopsy, Care After These instructions give you information about caring for yourself after your procedure. Your doctor may also give you more specific instructions. Call your doctor if you have any problems or questions after your procedure. What can I expect after the procedure? After your procedure, it is common to have:  Bruising on your breast.  Numbness, tingling, or pain near your biopsy site. Follow these instructions at home: Medicines  Take over-the-counter and prescription medicines only as told by your doctor.  Do not drive for 24 hours if you were given a medicine to help you relax (sedative) during your procedure.  Do not drink alcohol while taking pain medicine.  Do not drive or use heavy machinery while taking prescription pain medicine. Biopsy site care      Follow instructions from your doctor about how to take care of your cut from surgery (incision) or your puncture area. Make sure you: ? Wash your hands with soap and water before you change your bandage (dressing). If you cannot use soap and water, use hand sanitizer. ? Change your bandage as told by your doctor. ? Leave stitches (sutures), skin glue, or skin tape (adhesive strips) in place. They may need to stay in place for 2 weeks or longer. If tape strips get loose and curl up, you may trim the loose edges. Do not remove tape strips completely unless your doctor says it is okay.  If you have stitches, keep them dry when you take a bath or a shower.  Check your cut or puncture area every day for signs of infection. Check for: ? Redness, swelling, or pain. ? Fluid or blood. ? Warmth. ? Pus or a bad smell.  Protect the biopsy area. Do not let the area get bumped. Activity  If you had a cut during your procedure, avoid activities that could pull your cut open. These include: ? Stretching. ? Reaching over your head. ? Exercise. ? Sports. ? Lifting anything that weighs more than 3 lb (1.4  kg).  Return to your normal activities as told by your doctor. Ask your doctor what activities are safe for you. Managing pain, stiffness, and swelling If told, put ice on the biopsy site to relieve swelling:  Put ice in a plastic bag.  Place a towel between your skin and the bag.  Leave the ice on for 20 minutes, 2-3 times a day. General instructions  Continue your normal diet.  Wear a good support bra for as long as told by your doctor.  Get checked for extra fluid around your lymph nodes (lymphedema) as often as told by your doctor.  Keep all follow-up visits as told by your doctor. This is important. Contact a doctor if:  You notice any of the following at the biopsy site: ? More redness, swelling, or pain. ? More fluid or blood coming from the site. ? The site feels warm to the touch. ? Pus or a bad smell coming from the site. ? The site breaks open after the stitches or skin tape strips have been removed.  You have a rash.  You have a fever. Get help right away if:  You have more bleeding from the biopsy site. Get help right away if bleeding is more than a small spot.  You have trouble breathing.  You have red streaks around the biopsy site. Summary  After your procedure, it is common to have bruising, numbness, tingling, or pain near the biopsy site.  Do not drive   or use heavy machinery while taking prescription pain medicine.  Wear a good support bra for as long as told by your doctor.  If you had a cut during your procedure, avoid activities that may pull the cut open. Ask your doctor what activities are safe for you. This information is not intended to replace advice given to you by your health care provider. Make sure you discuss any questions you have with your health care provider. Document Revised: 06/13/2017 Document Reviewed: 06/13/2017 Elsevier Patient Education  2020 Elsevier Inc.  

## 2019-12-30 MED FILL — HUMIRA PEN 40 MG/0.4ML PNKT: 40 | 28 days supply | Qty: 2 | Fill #1

## 2020-01-04 LAB — SURGICAL PATHOLOGY

## 2020-01-06 ENCOUNTER — Other Ambulatory Visit: Payer: Self-pay | Admitting: Rheumatology

## 2020-01-06 NOTE — Telephone Encounter (Signed)
Last Visit:09/18/2019 Next Visit:02/18/2020 UDS: 08/06/2019 Narc Agreement: 08/06/2019  Last Fill: 12/09/2019  Okay to refill Tramadol?

## 2020-01-12 ENCOUNTER — Other Ambulatory Visit (HOSPITAL_COMMUNITY): Payer: Medicare PPO

## 2020-01-15 ENCOUNTER — Telehealth: Payer: Self-pay

## 2020-01-15 NOTE — Telephone Encounter (Signed)
Spoke with patient and advised we received her message and we have prescription ready for the chair lift. Patient asked that we mail the prescription to her.  Patient states she was recently diagnosed with breast cancer. Patient has a lumpectomy scheduled for 02/01/2020 and then she will start chemotherapy and radiation afterwards. Patient is on Humira.

## 2020-01-15 NOTE — Telephone Encounter (Signed)
Ok to provide prescription for lift chair.

## 2020-01-15 NOTE — Telephone Encounter (Signed)
Please advise 

## 2020-01-15 NOTE — Telephone Encounter (Signed)
[name] called stating she lives with her son and is having difficulty using the stairs.  [name] is asking if Dr. Estanislado Pandy could write her a prescription for a lift chair. Please advise.

## 2020-01-18 NOTE — Telephone Encounter (Signed)
Discussed new diagnosis of breast cancer with Dr. Estanislado Pandy. She recommends holding Humira prior to starting chemotherapy and radiation.

## 2020-01-18 NOTE — Telephone Encounter (Signed)
Attempted to contact the patient and left message for patient to call the office.  

## 2020-01-18 NOTE — Telephone Encounter (Signed)
Patient advised discussed new diagnosis of breast cancer with Dr. Estanislado Pandy. Patient advised she recommends holding Humira prior to starting chemotherapy and radiation.

## 2020-02-01 HISTORY — PX: MASTECTOMY: SHX3

## 2020-02-04 NOTE — Progress Notes (Signed)
Virtual Visit via Telephone Note  I connected with [name] on 02/18/20 at  4:00 PM EST by telephone and verified that I am speaking with the correct person using two identifiers.  Location: Patient: Home Provider: Clinic   This service was conducted via virtual visit.   The patient was located at home. I was located in my office.  Consent was obtained prior to the virtual visit and is aware of possible charges through their insurance for this visit.  The patient is an established patient.  Dr. Estanislado Pandy, MD conducted the virtual visit and Hazel Sams, PA-C acted as scribe during the service.  Office staff helped with scheduling follow up visits after the service was conducted.    I discussed the limitations, risks, security and privacy concerns of performing an evaluation and management service by telephone and the availability of in person appointments. I also discussed with the patient that there may be a patient responsible charge related to this service. The patient expressed understanding and agreed to proceed.  CC: Medication monitoring  History of Present Illness: Patient is a 75 year old female with a past medical history of seronegative rheumatoid arthritis and osteoarthritis. She reports since her last office visit she was diagnosed with breast cancer of the right breast.  She underwent a unilateral mastectomy on 02/01/20 without any complications.  She has been holding humira since 01/18/20 as recommended.  She has an upcoming appointment with her radiation oncologist and oncologist soon, so she plans on continuing to hold Humira until further direction regarding chemo and radiation therapy.   She denies any recent rheumatoid arthritis flares while being off of humira. She continues to have persistent pain in the right knee joint due to underlying osteoarthritis.  She plans on postponing a knee replacement until after completing chemotherapy in the future.  She continues to have  chronic lower back pain but is not having symptoms of sciatica.     Review of Systems  Constitutional: Positive for malaise/fatigue. Negative for fever.  HENT:       +Dry mouth  Eyes: Negative for photophobia, pain, discharge and redness.       +Dry eyes  Respiratory: Negative for cough, shortness of breath and wheezing.   Cardiovascular: Positive for leg swelling. Negative for chest pain and palpitations.  Gastrointestinal: Negative for blood in stool, constipation and diarrhea.  Genitourinary: Negative for dysuria.  Musculoskeletal: Positive for back pain, joint pain and myalgias. Negative for neck pain.       +Morning stiffness +Generalized weakness  Skin: Negative for rash.  Neurological: Negative for dizziness and headaches.  Psychiatric/Behavioral: Negative for depression. The patient is not nervous/anxious and does not have insomnia.      Observations/Objective: Physical Exam Neurological:     Mental Status: She is alert and oriented to person, place, and time.  Psychiatric:        Mood and Affect: Mood and affect normal.        Cognition and Memory: Memory normal.        Judgment: Judgment normal.    Patient reports morning stiffness for 15 minutes.   Patient denies nocturnal pain.  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  Since her last office visit she was diagnosed  with invasive ductal carcinoma the right breast.  She has been holding Humira since 01/18/2020 prior to undergoing unilateral mastectomy on 02/01/2020.  She has an upcoming appointment with her oncologist and radiation oncologist, so she plans on continuing to hold Humira until further  direction about chemotherapy and radiation therapy.  She will likely not require humira while receiving chemotherapy.  She will continue to take tramadol 50 mg 1 to 2 tablets daily as needed for pain relief. She will follow up in the office in 6 months.   High risk medication use -Holding Humira prior to starting Chemotherapy and radiation therapy.  Her last dose of Humira was on 01/18/2020.  CBC and CMP were updated on 11/16/2019.  TB gold negative on 11/16/2019.   History of total knee replacement, left-Doing well.   Unilateral primary osteoarthritis, right knee-Chronic pain.  She had updated x-rays of the right knee on 11/18/2018 ordered by Dr. Durward Fortes.  The plan was to schedule a knee replacement due to end-stage osteoarthritis and rheumatoid arthritic findings.  She plans on postponing her knee replacement until after chemotherapy and radiation therapy in the future.  She will continue to take tramadol 50 mg 1 to 2 tablets by mouth daily as needed for pain relief.  Primary osteoarthritis of both feet-She is not experiencing any discomfort in her feet at this time.   Therapeutic drug monitoring - Tramadol 50 mg 1 to 2 tablets by mouth daily PRN for pain relief.  future order for UDS placed today.  Patient was advised that she may switch to other medications as needed by her oncologist for pain management.  DDD (degenerative disc disease), lumbar-Chronic pain.  She uses a walker to assist with ambulation.   Invasive ductal carcinoma of right breast [facility]): She underwent a mastectomy on 02/01/2020.  She is currently holding Humira-her last dose was on 01/18/2020.  She will be following up with her radiation oncologist and medical oncologist soon to discuss proceeding with immunotherapy and radiation therapy.  She will continue to hold Humira throughout the course of chemotherapy/rad therapy.   Other medical conditions are listed as follows:   History of TIA (transient ischemic  attack)  History of hypertension-blood pressure was elevated today.  Have advised her to monitor blood pressure closely.  History of sleep apnea  History of hyperlipidemia  Follow Up Instructions: She will follow up in 6 months.   I discussed the assessment and treatment plan with the patient. The patient was provided an opportunity to ask questions and all were answered. The patient agreed with the plan and demonstrated an understanding of the instructions.   The patient was advised to call back or seek an in-person evaluation if the symptoms worsen or if the condition fails to improve as anticipated.  I provided 20 minutes of non-face-to-face time during this encounter.  Scribed byHazel Sams, PA-C  I reviewed the above note and attest to the accuracy of the document.  Bo Merino, MD

## 2020-02-08 ENCOUNTER — Other Ambulatory Visit: Payer: Self-pay | Admitting: Physician Assistant

## 2020-02-08 NOTE — Telephone Encounter (Signed)
Last Visit: 09/18/2019 Next Visit: 02/18/2020 UDS:02/04/2020, pending Narc Agreement: 02/03/2019  Last Fill: 01/06/2020, 09/18/2019 Tramadol 50 mg 1 to 2 tablets by mouth daily PRN for pain relief  Okay to refill Tramadol?

## 2020-02-18 ENCOUNTER — Other Ambulatory Visit: Payer: Self-pay

## 2020-02-18 ENCOUNTER — Telehealth (INDEPENDENT_AMBULATORY_CARE_PROVIDER_SITE_OTHER): Payer: Medicare PPO | Admitting: Rheumatology

## 2020-02-18 ENCOUNTER — Encounter: Payer: Self-pay | Admitting: Rheumatology

## 2020-02-18 VITALS — Ht 59.0 in

## 2020-02-18 DIAGNOSIS — Z5181 Encounter for therapeutic drug level monitoring: Secondary | ICD-10-CM

## 2020-02-18 DIAGNOSIS — C50911 Malignant neoplasm of unspecified site of right female breast: Secondary | ICD-10-CM

## 2020-02-18 DIAGNOSIS — Z96652 Presence of left artificial knee joint: Secondary | ICD-10-CM | POA: Diagnosis not present

## 2020-02-18 DIAGNOSIS — M0609 Rheumatoid arthritis without rheumatoid factor, multiple sites: Secondary | ICD-10-CM | POA: Diagnosis not present

## 2020-02-18 DIAGNOSIS — Z8673 Personal history of transient ischemic attack (TIA), and cerebral infarction without residual deficits: Secondary | ICD-10-CM

## 2020-02-18 DIAGNOSIS — M19072 Primary osteoarthritis, left ankle and foot: Secondary | ICD-10-CM

## 2020-02-18 DIAGNOSIS — M19071 Primary osteoarthritis, right ankle and foot: Secondary | ICD-10-CM

## 2020-02-18 DIAGNOSIS — M1711 Unilateral primary osteoarthritis, right knee: Secondary | ICD-10-CM

## 2020-02-18 DIAGNOSIS — M5136 Other intervertebral disc degeneration, lumbar region: Secondary | ICD-10-CM

## 2020-02-18 DIAGNOSIS — Z79899 Other long term (current) drug therapy: Secondary | ICD-10-CM | POA: Diagnosis not present

## 2020-02-18 DIAGNOSIS — Z8679 Personal history of other diseases of the circulatory system: Secondary | ICD-10-CM

## 2020-02-18 DIAGNOSIS — Z8669 Personal history of other diseases of the nervous system and sense organs: Secondary | ICD-10-CM

## 2020-02-18 DIAGNOSIS — Z8639 Personal history of other endocrine, nutritional and metabolic disease: Secondary | ICD-10-CM

## 2020-03-05 ENCOUNTER — Other Ambulatory Visit: Payer: Self-pay | Admitting: Rheumatology

## 2020-03-07 NOTE — Telephone Encounter (Signed)
Please call patient to schedule f/u appt, She will follow up in 6 months.  Thank you.  Patient needs UDS and narcotic agreement.

## 2020-03-07 NOTE — Telephone Encounter (Signed)
Last Visit: 02/18/2020 Next Visit: message sent to front desk to schedule f/u appt, She will follow up in 6 months.  UDS: 08/06/2019, Labs are consistent with tramadol use. Narc Agreement: 02/03/2019  Last Fill: 02/08/2020, Tramadol 50 mg 1 to 2 tablets by mouth daily PRN for pain relief  Okay to refill Tramadol?

## 2020-04-01 ENCOUNTER — Other Ambulatory Visit (HOSPITAL_COMMUNITY): Payer: Self-pay

## 2020-04-05 ENCOUNTER — Other Ambulatory Visit: Payer: Self-pay | Admitting: Rheumatology

## 2020-04-05 DIAGNOSIS — Z5181 Encounter for therapeutic drug level monitoring: Secondary | ICD-10-CM

## 2020-04-05 DIAGNOSIS — Z79899 Other long term (current) drug therapy: Secondary | ICD-10-CM

## 2020-04-05 NOTE — Telephone Encounter (Addendum)
Last Visit: 02/18/2020 Next Visit: 08/29/2020 UDS:08/06/2019, Labs are consistent with tramadol use. Narc Agreement: 02/03/2019  I called patient and advised UDS and narcotic agreement due, patient is taking chemo. Narcotic agreement mailed to patient to complete, labs released to [facility].   Last Fill: 03/07/2020  Okay to refill Tramadol?  Patient states she received hydrocodone when she had her breast cancer surgery. She did not call and advise [place] she was prescribed hydrocodone; however, she did call office to say she was having breast surgery. She said the doctor gave her the prescription when she left the hospital. Patient stated she has not taken 1 pill. She cannot get the bottle open. Please advise.

## 2020-04-06 NOTE — Telephone Encounter (Signed)
I will refill her tramadol today.  She should get UDS whenever she can go to the back.

## 2020-04-13 LAB — DRUG SCREEN, URINE

## 2020-04-13 LAB — TRAMADOL SCREEN, URINE

## 2020-04-13 NOTE — Telephone Encounter (Signed)
Please find out why the UDS was canceled.

## 2020-04-14 ENCOUNTER — Telehealth: Payer: Self-pay

## 2020-04-14 ENCOUNTER — Other Ambulatory Visit: Payer: Self-pay | Admitting: *Deleted

## 2020-04-14 DIAGNOSIS — Z79899 Other long term (current) drug therapy: Secondary | ICD-10-CM

## 2020-04-14 DIAGNOSIS — M0609 Rheumatoid arthritis without rheumatoid factor, multiple sites: Secondary | ICD-10-CM

## 2020-04-14 NOTE — Telephone Encounter (Signed)
Patient left a voicemail stating she needs a new order sent to [facility] in [place] for her repeat urine sample.  Patient states she will be going tomorrow, 04/15/20.

## 2020-04-14 NOTE — Telephone Encounter (Signed)
Specimen ordered and released.

## 2020-04-17 LAB — DRUG SCREEN, URINE
Amphetamines, Urine: NEGATIVE ng/mL
Barbiturate screen, urine: NEGATIVE ng/mL
Benzodiazepine Quant, Ur: NEGATIVE ng/mL
Cannabinoid Quant, Ur: NEGATIVE ng/mL
Cocaine (Metab.): NEGATIVE ng/mL
Opiate Quant, Ur: NEGATIVE ng/mL
PCP Quant, Ur: NEGATIVE ng/mL

## 2020-04-17 LAB — TRAMADOL SCREEN, URINE: Tramadol Screen, Urine: POSITIVE ng/mL — AB

## 2020-04-17 NOTE — Progress Notes (Signed)
UDS is consistent with treatment.

## 2020-05-09 ENCOUNTER — Other Ambulatory Visit: Payer: Self-pay | Admitting: Rheumatology

## 2020-05-09 DIAGNOSIS — Z5181 Encounter for therapeutic drug level monitoring: Secondary | ICD-10-CM

## 2020-05-09 DIAGNOSIS — Z79899 Other long term (current) drug therapy: Secondary | ICD-10-CM

## 2020-05-09 NOTE — Telephone Encounter (Signed)
Last Visit: 02/18/2020 Next Visit: 08/29/2020 UDS: 04/15/2020, UDS is consistent with treatment.  Narc Agreement: 04/11/2020  Last Fill: 04/06/2020, Tramadol 50 mg 1 to 2 tablets by mouth daily PRN for pain relief  Okay to refill Tramdol?

## 2020-06-05 ENCOUNTER — Other Ambulatory Visit: Payer: Self-pay | Admitting: Rheumatology

## 2020-06-05 DIAGNOSIS — Z5181 Encounter for therapeutic drug level monitoring: Secondary | ICD-10-CM

## 2020-06-05 DIAGNOSIS — Z79899 Other long term (current) drug therapy: Secondary | ICD-10-CM

## 2020-06-07 NOTE — Telephone Encounter (Signed)
Last Visit: 02/18/2020 Next Visit: 08/29/2020 UDS: 04/15/2020, UDS is consistent with treatment.  Narc Agreement: 04/11/2020  Last Fill: 05/09/2020  Okay to refill Tramadol?

## 2020-07-11 ENCOUNTER — Other Ambulatory Visit: Payer: Self-pay | Admitting: Physician Assistant

## 2020-07-11 DIAGNOSIS — Z5181 Encounter for therapeutic drug level monitoring: Secondary | ICD-10-CM

## 2020-07-11 DIAGNOSIS — Z79899 Other long term (current) drug therapy: Secondary | ICD-10-CM

## 2020-07-12 NOTE — Telephone Encounter (Signed)
Last Visit: 02/18/2020  Next Visit: 08/29/2020  UDS: 04/15/2020, UDS is consistent with treatment.   Narc Agreement: 04/11/2020   Last Fill: 06/07/2020   Okay to refill Tramadol?

## 2020-08-06 ENCOUNTER — Other Ambulatory Visit: Payer: Self-pay | Admitting: Rheumatology

## 2020-08-06 DIAGNOSIS — Z5181 Encounter for therapeutic drug level monitoring: Secondary | ICD-10-CM

## 2020-08-06 DIAGNOSIS — Z79899 Other long term (current) drug therapy: Secondary | ICD-10-CM

## 2020-08-08 NOTE — Telephone Encounter (Signed)
Last Visit: 02/18/2020 Next Visit: 08/29/2020 UDS:04/15/2020, UDS is consistent with treatment.  Narc Agreement: 04/11/2020  Last Fill: 07/12/2020, tramadol 50 mg 1 to 2 tablets by mouth daily as needed for pain relief.  Okay to refill Tramadol?

## 2020-08-15 NOTE — Progress Notes (Signed)
Office Visit Note  Patient: BERNARD KILLORAN             Date of Birth: [birth year]           MRN: [id number]             PCP: Ann Held, MD Referring: Ann Held, MD Visit Date: 08/29/2020 Occupation: '@GUAROCC'$ @  Subjective:  Left wrist pain.   History of Present Illness: Rudine S Ogando is a 75 y.o. female with a history of rheumatoid arthritis and osteoarthritis.  She underwent right breast mastectomy in January 2022 due to breast cancer.  She also had chemotherapy followed by radiation therapy.  She is on Herceptin now.  She states for the last 1 month she has been having increased pain and swelling of the left wrist joint.  She is experiencing nocturnal pain and difficulty doing routine activities.  None of the other joints are swollen but she has some increasing stiffness.  She would like to restart on Humira.  Activities of Daily Living:  Patient reports morning stiffness for 10-15 minutes.   Patient Reports nocturnal pain.  Difficulty dressing/grooming: Denies Difficulty climbing stairs: Reports Difficulty getting out of chair: Reports Difficulty using hands for taps, buttons, cutlery, and/or writing: Reports  Review of Systems  Constitutional:  Positive for fatigue.  HENT:  Negative for mouth sores, mouth dryness and nose dryness.   Eyes:  Positive for itching. Negative for pain and dryness.  Respiratory:  Positive for shortness of breath. Negative for difficulty breathing.   Cardiovascular:  Negative for chest pain and palpitations.  Gastrointestinal:  Positive for constipation. Negative for blood in stool and diarrhea.  Endocrine: Negative for increased urination.  Genitourinary:  Positive for involuntary urination. Negative for difficulty urinating.  Musculoskeletal:  Positive for joint pain, joint pain, myalgias, morning stiffness, muscle tenderness and myalgias. Negative for joint swelling.  Skin:  Negative for color change, rash and redness.   Allergic/Immunologic: Negative for susceptible to infections.  Neurological:  Positive for dizziness and numbness. Negative for headaches and memory loss.  Hematological:  Negative for bruising/bleeding tendency.  Psychiatric/Behavioral:  Negative for confusion.    PMFS History:  Patient Active Problem List   Diagnosis Date Noted   Unilateral primary osteoarthritis, right knee 11/18/2018   Closed fracture of shaft of metatarsal bone 07/30/2016   Metatarsal fracture 07/30/2016   Displaced fracture of second metatarsal bone, right foot, initial encounter for closed fracture 07/26/2016   Arthritis of shoulder 04/10/2016   High risk medication use 03/16/2016   Primary osteoarthritis of both feet 03/16/2016   Primary osteoarthritis of both knees 03/16/2016   DDD (degenerative disc disease), lumbar 03/16/2016   OSA on CPAP 07/27/2015   Lip numbness 03/19/2015   TIA (transient ischemic attack) 03/19/2015   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[place]) 02/06/2015   Post op infection 02/05/2015   History of lumbar laminectomy for spinal cord decompression 01/17/2015   Lumbar spinal stenosis 01/17/2015   Primary osteoarthritis of left knee 03/25/2014   Rheumatoid arthritis of multiple sites with negative rheumatoid factor ([place]) 03/25/2014   History of total knee replacement, left 03/25/2014   DERMATITIS 09/28/2008   CONSTIPATION 05/25/2008   DYSPNEA ON EXERTION 05/25/2008   ANEMIA, NORMOCYTIC 04/13/2008   ANKLE PAIN, BILATERAL 11/25/2007   HLD (hyperlipidemia) 08/14/2006   Morbid (severe) obesity due to excess calories ([place]) 12/13/2005   CARPAL TUNNEL SYNDROME 12/13/2005   PERIPHERAL NEUROPATHY 12/13/2005   Essential hypertension 12/13/2005   DYSRHYTHMIA, CARDIAC NOS  12/13/2005    Past Medical History:  Diagnosis Date   Anemia    Arthritis    Rheumatoid, followed by Dr. Estanislado Pandy, knees & ankles    Cancer [facility])    CHF (congestive heart failure) ([place])    pt not aware of this   Constipation     GERD (gastroesophageal reflux disease)    H/O cardiovascular stress test 02/2014   seen by Dr. Einar Gip - told that she was cleared for surgery   Heart murmur    states she's never had any problems   Hypertension    Leg cramps    Shortness of breath dyspnea    with exertion   Sleep apnea    CPAP in use- QO night, [facility] done at [facility]- 2007    Stroke [facility]) 03/2015   TIA    Family History  Problem Relation Age of Onset   Seizures Mother    Transient ischemic attack Mother    Hypertension Mother    Diabetes Mother    Congestive Heart Failure Mother    Arthritis/Rheumatoid Mother    Parkinson's disease Mother    Dementia Mother    Heart disease Father        CHF   Stroke Sister    Depression Sister    Arthritis Brother    Arthritis Brother    Arthritis Son    Hypertension Son    Past Surgical History:  Procedure Laterality Date   ABDOMINAL HYSTERECTOMY     CARPAL TUNNEL RELEASE Right    COLONOSCOPY     COLONOSCOPY WITH PROPOFOL N/A 11/13/2019   Procedure: COLONOSCOPY WITH PROPOFOL;  Surgeon: Carol Ada, MD;  Location: [facility];  Service: Endoscopy;  Laterality: N/A;   ESOPHAGOGASTRODUODENOSCOPY (EGD) WITH PROPOFOL N/A 11/13/2019   Procedure: ESOPHAGOGASTRODUODENOSCOPY (EGD) WITH PROPOFOL;  Surgeon: Carol Ada, MD;  Location: [facility];  Service: Endoscopy;  Laterality: N/A;   FOOT SURGERY Bilateral    bunionectomy, calluses , also has several pins & screws   HAND SURGERY     JOINT REPLACEMENT     planned for 03/25/2014   KNEE ARTHROPLASTY     KNEE SURGERY  1990's    LUMBAR LAMINECTOMY/DECOMPRESSION MICRODISCECTOMY N/A 01/17/2015   Procedure: L2-3, L3-4 Decompression;  Surgeon: [name], MD;  Location: [place];  Service: Orthopedics;  Laterality: N/A;   LUMBAR LAMINECTOMY/DECOMPRESSION MICRODISCECTOMY N/A 02/06/2015   Procedure: I AND D LUMBAR WITH WOUND VAC PLACEMENT;  Surgeon: [name], MD;  Location: [place];  Service: Orthopedics;  Laterality: N/A;    MASTECTOMY Right 02/01/2020   ORIF TOE FRACTURE Right 07/30/2016   Procedure: OPEN REDUCTION INTERNAL FIXATION (ORIF) RIGHT 2ND METATARSAL (TOE) FRACTURE;  Surgeon: [name], MD;  Location: [place];  Service: Orthopedics;  Laterality: Right;   POLYPECTOMY  11/13/2019   Procedure: POLYPECTOMY;  Surgeon: Carol Ada, MD;  Location: [facility];  Service: Endoscopy;;   TOTAL KNEE ARTHROPLASTY Left 03/25/2014   Procedure: TOTAL KNEE ARTHROPLASTY;  Surgeon: Garald Balding, MD;  Location: [place];  Service: Orthopedics;  Laterality: Left;   TUBAL LIGATION     Social History   Social History Narrative   Lives with husband.  2 Sons.  Using walker.    Caffeine  Less then 1 cups daily.   Retired.     Immunization History  Administered Date(s) Administered   H1N1 12/23/2007   Influenza Split 11/03/2014   Influenza Whole 12/19/2005, 11/13/2006   Influenza,inj,Quad PF,6+ Mos 09/16/2017   Influenza-Unspecified  11/03/2014   PFIZER Comirnaty(Gray Top)Covid-19 Tri-Sucrose Vaccine 01/28/2019, 02/18/2019, 10/07/2019   PFIZER(Purple Top)SARS-COV-2 Vaccination 01/28/2019, 02/18/2019   Pneumococcal Polysaccharide-23 12/19/2005   Tdap 12/20/2011     Objective: Vital Signs: BP (!) 158/82 (BP Location: Left Wrist, Patient Position: Sitting, Cuff Size: Normal)   Pulse 68   Ht '4\' 10"'$  (1.473 m)   Wt 224 lb 6.4 oz (101.8 kg)   BMI 46.90 kg/m    Physical Exam Vitals and nursing note reviewed.  Constitutional:      Appearance: She is well-developed.  HENT:     Head: Normocephalic and atraumatic.  Eyes:     Conjunctiva/sclera: Conjunctivae normal.  Cardiovascular:     Rate and Rhythm: Normal rate and regular rhythm.     Heart sounds: Normal heart sounds.  Pulmonary:     Effort: Pulmonary effort is normal.     Breath sounds: Normal breath sounds.  Abdominal:     General: Bowel sounds are normal.     Palpations: Abdomen is soft.  Musculoskeletal:     Cervical back: Normal range of motion.   Lymphadenopathy:     Cervical: No cervical adenopathy.  Skin:    General: Skin is warm and dry.     Capillary Refill: Capillary refill takes less than 2 seconds.  Neurological:     Mental Status: She is alert and oriented to person, place, and time.  Psychiatric:        Behavior: Behavior normal.     Musculoskeletal Exam: C-spine was in good range of motion.  Shoulder joints and elbow joints with good range of motion.  She has synovitis and tenderness on palpation over her left wrist joint.  There was synovial thickening over MCP joints.  PIP and DIP prominence with no synovitis was noted.  Hip joints and knee joints in good range of motion without discomfort.  She had no tenderness over ankles or MTPs.  CDAI Exam: CDAI Score: 3  Patient Global: 5 mm; Provider Global: 5 mm Swollen: 1 ; Tender: 1  Joint Exam 08/29/2020      Right  Left  Wrist     Swollen Tender     Investigation: No additional findings.  Imaging: No results found.  Recent Labs: Lab Results  Component Value Date   WBC 8.6 11/16/2019   HGB 11.4 11/16/2019   PLT 285 11/16/2019   NA 147 (H) 11/16/2019   K 4.0 11/16/2019   CL 109 (H) 11/16/2019   CO2 25 11/16/2019   GLUCOSE 95 11/16/2019   BUN 15 11/16/2019   CREATININE 1.03 (H) 11/16/2019   BILITOT 0.2 11/16/2019   ALKPHOS 129 (H) 11/16/2019   AST 17 11/16/2019   ALT 9 11/16/2019   PROT 6.9 11/16/2019   ALBUMIN 3.8 11/16/2019   CALCIUM 8.9 11/16/2019   GFRAA 62 11/16/2019   QFTBGOLDPLUS Negative 11/16/2019   August 24, 2020 GFR 47, AST and ALT normal, CBC with differential showed hemoglobin 10.1, WBC 6.6, platelets 239   Speciality Comments: Narcotic Agreement 10/30/16  Procedures:  No procedures performed Allergies: Ramipril   Assessment / Plan:     Visit Diagnoses: Rheumatoid arthritis of multiple sites with negative rheumatoid factor (HCC)-she has been experiencing increased pain and discomfort in her joints as she has been off Humira  for a while now.  She has been having increased pain and swelling in her left wrist joint.  She had tenderness and synovitis on palpation today.  We discussed discussed starting back on Humira.  She  was in agreement.  Side effects were reviewed.  We will get approval for Humira again and restart her on Humira.  We will reach out to her oncologist and will get clearance to restart her on Humira.  High risk medication use - Holding Humira prior to starting Chemotherapy and radiation therapy.  [facility] at [facility] from August 2022 CBC and CMP were normal except for mild anemia and low GFR.  TB gold was negative in November 2021.  Her dose of Humira will be 40 mg subcu every other week.  If her symptoms get better control we can space out Humira dose.  History of total knee replacement, left-doing well  Unilateral primary osteoarthritis, right knee-she has some discomfort but no swelling on examination today  Primary osteoarthritis of both feet-proper fitting shoes were discussed.  Therapeutic drug monitoring -she has been taking tramadol 50 mg 1 to 2 tablets by mouth daily PRN for pain relief. UDS: 04/15/2020 c/w narcotic agreement: 04/11/2020  DDD (degenerative disc disease), lumbar-she continues to have some lower back pain.  Tramadol helps her with the pain symptoms.  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 CTX, RTX. On Herceptin and Letrozole.  Treated at [facility].  Other medical problems are listed as follows:  History of TIA (transient ischemic attack)  History of hyperlipidemia  History of hypertension  History of sleep apnea  Orders: No orders of the defined types were placed in this encounter.  No orders of the defined types were placed in this encounter.    Follow-Up Instructions: Return in about 2 months (around 10/29/2020) for Rheumatoid arthritis, Osteoarthritis.   Bo Merino,  MD  Note - This record has been created using Editor, commissioning.  Chart creation errors have been sought, but may not always  have been located. Such creation errors do not reflect on  the standard of medical care.

## 2020-08-29 ENCOUNTER — Ambulatory Visit (INDEPENDENT_AMBULATORY_CARE_PROVIDER_SITE_OTHER): Payer: Medicare PPO | Admitting: Rheumatology

## 2020-08-29 ENCOUNTER — Telehealth: Payer: Self-pay | Admitting: *Deleted

## 2020-08-29 ENCOUNTER — Other Ambulatory Visit: Payer: Self-pay

## 2020-08-29 ENCOUNTER — Other Ambulatory Visit (HOSPITAL_COMMUNITY): Payer: Self-pay

## 2020-08-29 ENCOUNTER — Encounter: Payer: Self-pay | Admitting: Rheumatology

## 2020-08-29 ENCOUNTER — Telehealth: Payer: Self-pay | Admitting: Pharmacist

## 2020-08-29 VITALS — BP 158/82 | HR 68 | Ht <= 58 in | Wt 224.4 lb

## 2020-08-29 DIAGNOSIS — M1711 Unilateral primary osteoarthritis, right knee: Secondary | ICD-10-CM

## 2020-08-29 DIAGNOSIS — Z8639 Personal history of other endocrine, nutritional and metabolic disease: Secondary | ICD-10-CM

## 2020-08-29 DIAGNOSIS — M0609 Rheumatoid arthritis without rheumatoid factor, multiple sites: Secondary | ICD-10-CM | POA: Diagnosis not present

## 2020-08-29 DIAGNOSIS — C50911 Malignant neoplasm of unspecified site of right female breast: Secondary | ICD-10-CM

## 2020-08-29 DIAGNOSIS — Z79899 Other long term (current) drug therapy: Secondary | ICD-10-CM

## 2020-08-29 DIAGNOSIS — Z5181 Encounter for therapeutic drug level monitoring: Secondary | ICD-10-CM

## 2020-08-29 DIAGNOSIS — Z8673 Personal history of transient ischemic attack (TIA), and cerebral infarction without residual deficits: Secondary | ICD-10-CM

## 2020-08-29 DIAGNOSIS — M19071 Primary osteoarthritis, right ankle and foot: Secondary | ICD-10-CM

## 2020-08-29 DIAGNOSIS — M19072 Primary osteoarthritis, left ankle and foot: Secondary | ICD-10-CM

## 2020-08-29 DIAGNOSIS — Z8669 Personal history of other diseases of the nervous system and sense organs: Secondary | ICD-10-CM

## 2020-08-29 DIAGNOSIS — Z96652 Presence of left artificial knee joint: Secondary | ICD-10-CM

## 2020-08-29 DIAGNOSIS — M5136 Other intervertebral disc degeneration, lumbar region: Secondary | ICD-10-CM

## 2020-08-29 DIAGNOSIS — Z8679 Personal history of other diseases of the circulatory system: Secondary | ICD-10-CM

## 2020-08-29 NOTE — Progress Notes (Signed)
Pharmacy Note Subjective: Patient presents today to [facility] for follow up office visit. Patient seen by the pharmacist for counseling on Humira for rheumatoid arthritis.  Prior therapy includes: Humira - stopped due to chemotherapy followed by radiation therapy for invasive breast cancer. Her last Humira dose was on 01/18/20  Diagnosis of heart failure: No  Objective:  CBC    Component Value Date/Time   WBC 8.6 11/16/2019 1139   WBC 6.4 10/31/2018 1027   RBC 3.97 11/16/2019 1139   RBC 3.88 10/31/2018 1027   HGB 11.4 11/16/2019 1139   HCT 34.4 11/16/2019 1139   PLT 285 11/16/2019 1139   MCV 87 11/16/2019 1139   MCH 28.7 11/16/2019 1139   MCH 29.1 10/31/2018 1027   MCHC 33.1 11/16/2019 1139   MCHC 32.8 10/31/2018 1027   RDW 12.5 11/16/2019 1139   LYMPHSABS 2.1 11/16/2019 1139   MONOABS 476 05/11/2016 1127   EOSABS 0.2 11/16/2019 1139   BASOSABS 0.1 11/16/2019 1139     CMP     Component Value Date/Time   NA 147 (H) 11/16/2019 1139   K 4.0 11/16/2019 1139   CL 109 (H) 11/16/2019 1139   CO2 25 11/16/2019 1139   GLUCOSE 95 11/16/2019 1139   GLUCOSE 87 10/31/2018 1027   BUN 15 11/16/2019 1139   CREATININE 1.03 (H) 11/16/2019 1139   CREATININE 1.13 (H) 10/31/2018 1027   CALCIUM 8.9 11/16/2019 1139   PROT 6.9 11/16/2019 1139   ALBUMIN 3.8 11/16/2019 1139   AST 17 11/16/2019 1139   ALT 9 11/16/2019 1139   ALKPHOS 129 (H) 11/16/2019 1139   BILITOT 0.2 11/16/2019 1139   GFRNONAA 54 (L) 11/16/2019 1139   GFRNONAA 49 (L) 10/31/2018 1027   GFRAA 62 11/16/2019 1139   GFRAA 56 (L) 10/31/2018 1027      Baseline Immunosuppressant Therapy Labs TB GOLD Quantiferon TB Gold Latest Ref Rng & Units 11/16/2019  Quantiferon TB Gold Plus Negative Negative   Hepatitis Panel   HIV No results found for: HIV Immunoglobulins   SPEP Serum Protein Electrophoresis Latest Ref Rng & Units 11/16/2019  Total Protein 6.0 - 8.5 g/dL 6.9   Chest x-ray: 01/06/2015 - no active  cardiopulmonary disease  Assessment/Plan:  Brittney Perking, LPN, has already reached out to patient's oncologist, Dr. Marcello Moores and Dr. Ranelle Oyster for clearance to restart Humira. Therefore we will wait until patient is cleared by oncologist to restart Humira. Patient aware of copay of $100 per 28 days and is comfortable paying this. We will   Reviewed the importance of regular labs while on Humira therapy. Will monitor CBC and CMP 1 month after starting and then every 3 months routinely thereafter. Will monitor TB gold annually. Standing orders placed. Provided patient with medication education material and answered all questions.  Patient consented to Humira.  Will upload consent into the media tab.  Reviewed storage instructions of Humira.  Advised initial injection must be administered in office.  Patient verbalized understanding.  Dermatology referral will be placed at new start visit.  Dose will be for rheumatoid arthritis Humira 40 mg every 14 days.  Prescription pending lab results and/or insurance approval.   [name], PharmD, MPH, BCPS Clinical Pharmacist (Rheumatology and Pulmonology)

## 2020-08-29 NOTE — Telephone Encounter (Signed)
Received a call back from [place] with Dr. Marcello Moores' office Hematology and Oncology - 3rd fl [place]  with [facility].   At this time Dr. Marcello Moores would like to hold the Humira. Dr. Marcello Moores is checking with the Cardiology team as there are cardiovascular concerns between the Humira and medication that she is getting with oncology.

## 2020-08-29 NOTE — Patient Instructions (Signed)
Standing Labs We placed an order today for your standing lab work.   Please have your standing labs drawn in 1 month after starting Humira then every 3 months  If possible, please have your labs drawn 2 weeks prior to your appointment so that the provider can discuss your results at your appointment.  Please note that you may see your imaging and lab results in [place] before we have reviewed them. We may be awaiting multiple results to interpret others before contacting you. Please allow our office up to 72 hours to thoroughly review all of the results before contacting the office for clarification of your results.  We have open lab daily: Monday through Thursday from 1:30-4:30 PM and Friday from 1:30-4:00 PM at the office of Dr. Bo Merino, [facility].   Please be advised, all patients with office appointments requiring lab work will take precedent over walk-in lab work.  If possible, please come for your lab work on Monday and Friday afternoons, as you may experience shorter wait times. The office is located at [address] [place], [place], [place] [address] No appointment is necessary.   Labs are drawn by [facility]. Please bring your co-pay at the time of your lab draw.  You may receive a bill from [facility] for your lab work.  If you wish to have your labs drawn at another location, please call the office 24 hours in advance to send orders.  If you have any questions regarding directions or hours of operation,  please call [phone].   As a reminder, please drink plenty of water prior to coming for your lab work. Thanks!

## 2020-08-29 NOTE — Telephone Encounter (Signed)
Patient planning to restart Humira. Her prior authorization remains in place through 01/07/21 through [facility]. Previously filling Humira through [facility]  Dose: '40mg'$  every 14 days  Dx: Rheumatoid arthritis (M05.9)  Per test claim, copay is $100 per 28 days. She paid this before. She gets an estimated $5000 per month through pension check.  Once cleared to start Humira by oncologist, we will bring her in to start since her last dose of Humira was > 3 months ago (January 2022)  Knox Saliva, PharmD, MPH, BCPS Clinical Pharmacist (Rheumatology and Pulmonology)

## 2020-08-30 NOTE — Telephone Encounter (Signed)
Patient advised at this time Dr. Marcello Moores would like to hold the Humira. Dr. Marcello Moores is checking with the Cardiology team as there are cardiovascular concerns between the Humira and medication that she is getting with oncology. Patient advised per Dr. Estanislado Pandy there are other medications if she is unable to use Humira. Patient expressed understanding.

## 2020-08-31 NOTE — Telephone Encounter (Signed)
Per note from Gwenlyn Perking, LPN.  Patient advised at this time Dr. Marcello Moores would like to hold the Humira. Dr. Marcello Moores is checking with the Cardiology team as there are cardiovascular concerns between the Humira and medication that she is getting with oncology. Patient advised per Dr. Estanislado Pandy there are other medications if she is unable to use Humira. Patient expressed understanding.   May consider Orencia as an option  Knox Saliva, PharmD, MPH, BCPS Clinical Pharmacist (Rheumatology and Pulmonology)

## 2020-08-31 NOTE — Telephone Encounter (Signed)
I received a phone call from Dr. Marcello Moores.  She stated that due to concern of cardiovascular side effects she would prefer to avoid anti-TNFs.  I reviewed patient's chart, she does have mild elevation of alkaline phosphatase and high creatinine.  The options would be to start the patient on leflunomide or abatacept. In my opinion abatacept may be a better choice.  Please schedule an appointment with taylor or me to discuss treatment options.

## 2020-08-31 NOTE — Telephone Encounter (Signed)
Spoke with patient and scheduled her for 09/07/2020 at 10:40 am to discuss treatment options.

## 2020-09-02 NOTE — Progress Notes (Signed)
Office Visit Note  Patient: [name]             Date of Birth: 04/18/1945           MRN: [id number]             PCP: Ann Held, MD Referring: Ann Held, MD Visit Date: 09/07/2020 Occupation: '@GUAROCC'$ @  Subjective:  Discussed treatment options  History of Present Illness: [name] is a 75 y.o. female with history of seronegative rheumatoid arthritis, osteoarthritis, and DDD.  Patient presents today to discuss treatment options.  Her oncologist does not feel that she was a good candidate for TNF inhibitors due to possible cardiovascular side effects.  She was previously on Humira but discontinued due to undergoing chemotherapy and radiation. She underwent a right breast mastectomy in January 2022.  She continues to have persistent pain and ongoing warmth and swelling in the left wrist joint.  She is not having any discomfort in her right wrist or hands at this time.  She denies any other joint pain or joint swelling currently.  She continues to take tramadol and Tylenol on a daily basis for pain relief.  She denies any recent falls.  She continues to use her walker to assist with ambulation.  She denies any new concerns since her last visit.  She denies any recent infections.    Activities of Daily Living:  Patient reports morning stiffness for 15 minutes.   Patient Denies nocturnal pain.  Difficulty dressing/grooming: Denies Difficulty climbing stairs: Reports Difficulty getting out of chair: Reports Difficulty using hands for taps, buttons, cutlery, and/or writing: Reports  Review of Systems  Constitutional:  Positive for fatigue.  HENT:  Negative for mouth sores, mouth dryness and nose dryness.   Eyes:  Negative for pain, itching and dryness.  Respiratory:  Positive for shortness of breath. Negative for difficulty breathing.   Cardiovascular:  Negative for chest pain and palpitations.  Gastrointestinal:  Positive for constipation. Negative for blood  in stool and diarrhea.  Endocrine: Negative for increased urination.  Genitourinary:  Positive for involuntary urination. Negative for difficulty urinating.  Musculoskeletal:  Positive for joint pain, joint pain, joint swelling, myalgias, morning stiffness, muscle tenderness and myalgias.  Skin:  Negative for color change, rash and redness.  Allergic/Immunologic: Negative for susceptible to infections.  Neurological:  Positive for dizziness and numbness. Negative for headaches, memory loss and weakness.  Hematological:  Negative for bruising/bleeding tendency.  Psychiatric/Behavioral:  Negative for confusion.    PMFS History:  Patient Active Problem List   Diagnosis Date Noted   Unilateral primary osteoarthritis, right knee 11/18/2018   Closed fracture of shaft of metatarsal bone 07/30/2016   Metatarsal fracture 07/30/2016   Displaced fracture of second metatarsal bone, right foot, initial encounter for closed fracture 07/26/2016   Arthritis of shoulder 04/10/2016   High risk medication use 03/16/2016   Primary osteoarthritis of both feet 03/16/2016   Primary osteoarthritis of both knees 03/16/2016   DDD (degenerative disc disease), lumbar 03/16/2016   OSA on CPAP 07/27/2015   Lip numbness 03/19/2015   TIA (transient ischemic attack) 03/19/2015   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[place]) 02/06/2015   Post op infection 02/05/2015   History of lumbar laminectomy for spinal cord decompression 01/17/2015   Lumbar spinal stenosis 01/17/2015   Primary osteoarthritis of left knee 03/25/2014   Rheumatoid arthritis of multiple sites with negative rheumatoid factor ([place]) 03/25/2014   History of total knee replacement, left 03/25/2014  DERMATITIS 09/28/2008   CONSTIPATION 05/25/2008   DYSPNEA ON EXERTION 05/25/2008   ANEMIA, NORMOCYTIC 04/13/2008   ANKLE PAIN, BILATERAL 11/25/2007   HLD (hyperlipidemia) 08/14/2006   Morbid (severe) obesity due to excess calories ([place]) 12/13/2005   CARPAL TUNNEL  SYNDROME 12/13/2005   PERIPHERAL NEUROPATHY 12/13/2005   Essential hypertension 12/13/2005   DYSRHYTHMIA, CARDIAC NOS 12/13/2005    Past Medical History:  Diagnosis Date   Anemia    Arthritis    Rheumatoid, followed by Dr. Estanislado Pandy, knees & ankles    Cancer ([place])    CHF (congestive heart failure) ([place])    pt not aware of this   Constipation    GERD (gastroesophageal reflux disease)    H/O cardiovascular stress test 02/2014   seen by Dr. Einar Gip - told that she was cleared for surgery   Heart murmur    states she's never had any problems   Hypertension    Leg cramps    Shortness of breath dyspnea    with exertion   Sleep apnea    CPAP in use- QO night, study done at [facility]- 2007    Stroke [facility]) 03/2015   TIA    Family History  Problem Relation Age of Onset   Seizures Mother    Transient ischemic attack Mother    Hypertension Mother    Diabetes Mother    Congestive Heart Failure Mother    Arthritis/Rheumatoid Mother    Parkinson's disease Mother    Dementia Mother    Heart disease Father        CHF   Stroke Sister    Depression Sister    Arthritis Brother    Arthritis Brother    Arthritis Son    Hypertension Son    Past Surgical History:  Procedure Laterality Date   ABDOMINAL HYSTERECTOMY     CARPAL TUNNEL RELEASE Right    COLONOSCOPY     COLONOSCOPY WITH PROPOFOL N/A 11/13/2019   Procedure: COLONOSCOPY WITH PROPOFOL;  Surgeon: Carol Ada, MD;  Location: [facility];  Service: Endoscopy;  Laterality: N/A;   ESOPHAGOGASTRODUODENOSCOPY (EGD) WITH PROPOFOL N/A 11/13/2019   Procedure: ESOPHAGOGASTRODUODENOSCOPY (EGD) WITH PROPOFOL;  Surgeon: Carol Ada, MD;  Location: [facility];  Service: Endoscopy;  Laterality: N/A;   FOOT SURGERY Bilateral    bunionectomy, calluses , also has several pins & screws   HAND SURGERY     JOINT REPLACEMENT     planned for 03/25/2014   KNEE ARTHROPLASTY     KNEE SURGERY  1990's    LUMBAR LAMINECTOMY/DECOMPRESSION  MICRODISCECTOMY N/A 01/17/2015   Procedure: L2-3, L3-4 Decompression;  Surgeon: [name], MD;  Location: [place];  Service: Orthopedics;  Laterality: N/A;   LUMBAR LAMINECTOMY/DECOMPRESSION MICRODISCECTOMY N/A 02/06/2015   Procedure: I AND D LUMBAR WITH WOUND VAC PLACEMENT;  Surgeon: [name], MD;  Location: [place];  Service: Orthopedics;  Laterality: N/A;   MASTECTOMY Right 02/01/2020   ORIF TOE FRACTURE Right 07/30/2016   Procedure: OPEN REDUCTION INTERNAL FIXATION (ORIF) RIGHT 2ND METATARSAL (TOE) FRACTURE;  Surgeon: [name], MD;  Location: [place];  Service: Orthopedics;  Laterality: Right;   POLYPECTOMY  11/13/2019   Procedure: POLYPECTOMY;  Surgeon: Carol Ada, MD;  [facility];  Service: Endoscopy;;   TOTAL KNEE ARTHROPLASTY Left 03/25/2014   Procedure: TOTAL KNEE ARTHROPLASTY;  Surgeon: Garald Balding, MD;  Location: [place];  Service: Orthopedics;  Laterality: Left;   TUBAL LIGATION     Social History   Social  History Narrative   Lives with husband.  2 Sons.  Using walker.    Caffeine  Less then 1 cups daily.   Retired.     Immunization History  Administered Date(s) Administered   H1N1 12/23/2007   Influenza Split 11/03/2014   Influenza Whole 12/19/2005, 11/13/2006   Influenza,inj,Quad PF,6+ Mos 09/16/2017   Influenza-Unspecified 11/03/2014   PFIZER Comirnaty(Gray Top)Covid-19 Tri-Sucrose Vaccine 01/28/2019, 02/18/2019, 10/07/2019   PFIZER(Purple Top)SARS-COV-2 Vaccination 01/28/2019, 02/18/2019   Pneumococcal Polysaccharide-23 12/19/2005   Tdap 12/20/2011     Objective: Vital Signs: BP 134/80 (BP Location: Left Arm, Patient Position: Sitting, Cuff Size: Large)   Pulse 71   Ht '4\' 10"'$  (1.473 m)   Wt 225 lb (102.1 kg)   BMI 47.03 kg/m    Physical Exam Vitals and nursing note reviewed.  Constitutional:      Appearance: She is well-developed.  HENT:     Head: Normocephalic and atraumatic.  Eyes:     Conjunctiva/sclera: Conjunctivae normal.   Pulmonary:     Effort: Pulmonary effort is normal.  Abdominal:     Palpations: Abdomen is soft.  Musculoskeletal:     Cervical back: Normal range of motion.  Skin:    General: Skin is warm and dry.     Capillary Refill: Capillary refill takes less than 2 seconds.  Neurological:     Mental Status: She is alert and oriented to person, place, and time.  Psychiatric:        Behavior: Behavior normal.     Musculoskeletal Exam: C-spine has good range of motion with no discomfort.  No midline spinal tenderness.  Shoulder joints and elbow joints have good range of motion with no discomfort.  Painful and limited range of motion of the left wrist joint.  Tenderness and synovitis over the ulnar aspect of the left wrist.  Synovial thickening over MCP joints but no tenderness or synovitis.  PIP and DIP prominence consistent with osteoarthritis of both hands noted.  Hip joints difficult to assess in seated position.  Knee joints have good range of motion with no discomfort.  No warmth or effusion of knee joints noted.  Ankle joints have good range of motion with no tenderness or joint swelling.  Pedal edema noted bilaterally.  CDAI Exam: CDAI Score: 2.8  Patient Global: 4 mm; Provider Global: 4 mm Swollen: 1 ; Tender: 1  Joint Exam 09/07/2020      Right  Left  Wrist     Swollen Tender     Investigation: No additional findings.  Imaging: No results found.  Recent Labs: Lab Results  Component Value Date   WBC 8.6 11/16/2019   HGB 11.4 11/16/2019   PLT 285 11/16/2019   NA 147 (H) 11/16/2019   K 4.0 11/16/2019   CL 109 (H) 11/16/2019   CO2 25 11/16/2019   GLUCOSE 95 11/16/2019   BUN 15 11/16/2019   CREATININE 1.03 (H) 11/16/2019   BILITOT 0.2 11/16/2019   ALKPHOS 129 (H) 11/16/2019   AST 17 11/16/2019   ALT 9 11/16/2019   PROT 6.9 11/16/2019   ALBUMIN 3.8 11/16/2019   CALCIUM 8.9 11/16/2019   GFRAA 62 11/16/2019   QFTBGOLDPLUS Negative 11/16/2019    Speciality Comments:  Narcotic Agreement 10/30/16  Procedures:  No procedures performed Allergies: Ramipril    Assessment / Plan:     Visit Diagnoses: Rheumatoid arthritis of multiple sites with negative rheumatoid factor ([place]): She has ongoing tenderness and synovitis of the left wrist joint which initially started  2 months ago.  She underwent a right breast mastectomy in January 2022 followed by chemotherapy and radiation.  During that time she held Humira as recommended.  She presented to the office on 08/29/2020 to discuss restarting on Humira since she has been having increased joint pain and stiffness.  Her oncologist Dr. Marcello Moores was concerned about the cardiovascular side effects of TNF inhibitors so he did not recommend restarting on Humira at this time.  She presented today to discuss other treatment options.  Indications, contraindications, potential side effects of Orencia were discussed today in detail.  We will apply for Orencia 125 mg subcutaneous injections once weekly through her insurance and once approved she will return to the office for administration of the first injection.  All questions were addressed and consent was obtained today.  She will follow-up in the office in 8 weeks to assess her response.  Medication counseling:  TB Gold: Negative 11/16/19.  Future order for TB gold placed today. Hepatitis panel: 2010 HIV: Pending SPEP: 2010 Immunoglobulins: Pending  Does patient have a diagnosis of COPD? No  Counseled patient that Maureen Chatters is a selective T-cell costimulation blocker indicated for rheumatoid arthritis.  Counseled patient on purpose, proper use, and adverse effects of Orencia. The most common adverse effects are increased risk of infections, headache, and injection site reactions.  There is the possibility of an increased risk of malignancy but it is not well understood if this increased risk is due to the medication or the disease state.  Reviewed the importance of regular labs while on  Orencia therapy.  Counseled patient that Maureen Chatters should be held prior to scheduled surgery.  Counseled patient to avoid live vaccines while on Orencia.  Advised patient to get annual influenza vaccine and the pneumococcal vaccine as indicated.  Provided patient with medication education material and answered all questions.  Patient consented to [facility].  Will upload consent into patient's chart.  Will apply for Orencia through patient's insurance.  Reviewed storage information for Orencia.  Advised initial injection must be administered in office.    High risk medication use - Applying for Orencia 125 mg 7 days injections once weekly.  Previously held Humira prior to starting Chemotherapy and radiation therapy. She is not a good candidate for TNF inhibitors due to the potential risk for CHF.  Lab work from 08/24/2020 was reviewed today in the office.  TB Gold negative on 11/16/2019.  Future order for TB gold will be placed today.- Plan: QuantiFERON-TB Gold Plus, HIV Antibody (routine testing w rflx), IgG, IgA, IgM She has not had any recent infections.  We discussed the importance of holding Orencia if she develops signs or symptoms of an infection and to resume once the infection has completely cleared.  Screening for tuberculosis Future order for TB gold placed today.  Plan: QuantiFERON-TB Gold Plus  History of total knee replacement, left: Doing well.  She has good range of motion with no discomfort.  No warmth or effusion was noted.  She uses a walker to assist with ambulation.  She has not had any recent falls.  Unilateral primary osteoarthritis, right knee: She is not experiencing any increased discomfort in her right knee joint.  She has good range of motion with no warmth or effusion.  Primary osteoarthritis of both feet: She is not experiencing any discomfort in her feet at this time.  She has good range of motion of both ankle joints with no tenderness or joint swelling.  She is wearing proper  fitting shoes.  Therapeutic drug monitoring - Tramadol 50 mg 1 to 2 tablets by mouth daily PRN for pain relief. UDS: 04/15/2020 c/w narcotic agreement: 04/11/2020  DDD (degenerative disc disease), lumbar: She is not experiencing any discomfort in her lower back currently.  She has no midline spinal tenderness on examination today.  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 CTX, RTX. On Herceptin and Letrozole.  [facility].  History of TIA (transient ischemic attack)  History of hypertension: BP was 134/80 today in the office.   History of hyperlipidemia  History of sleep apnea    Orders: Orders Placed This Encounter  Procedures   QuantiFERON-TB Gold Plus   HIV Antibody (routine testing w rflx)   IgG, IgA, IgM   No orders of the defined types were placed in this encounter.    Follow-Up Instructions: Return in about 8 weeks (around 11/02/2020) for Rheumatoid arthritis, Osteoarthritis, DDD.   Ofilia Neas, PA-C  Note - This record has been created using Dragon software.  Chart creation errors have been sought, but may not always  have been located. Such creation errors do not reflect on  the standard of medical care.

## 2020-09-03 ENCOUNTER — Other Ambulatory Visit: Payer: Self-pay | Admitting: Physician Assistant

## 2020-09-03 DIAGNOSIS — Z79899 Other long term (current) drug therapy: Secondary | ICD-10-CM

## 2020-09-03 DIAGNOSIS — Z5181 Encounter for therapeutic drug level monitoring: Secondary | ICD-10-CM

## 2020-09-05 NOTE — Telephone Encounter (Signed)
Next Visit: 10/31/2020  Last Visit: 08/29/2020  UDS:04/15/2020 c/w  Narc Agreement: 04/11/2020  Last Fill: 08/08/2020 tramadol 50 mg 1 to 2 tablets by mouth daily PRN for pain relief  Okay to refill Tramadol?

## 2020-09-07 ENCOUNTER — Other Ambulatory Visit: Payer: Self-pay

## 2020-09-07 ENCOUNTER — Ambulatory Visit (INDEPENDENT_AMBULATORY_CARE_PROVIDER_SITE_OTHER): Payer: Medicare PPO | Admitting: Physician Assistant

## 2020-09-07 ENCOUNTER — Encounter: Payer: Self-pay | Admitting: Physician Assistant

## 2020-09-07 ENCOUNTER — Telehealth: Payer: Self-pay | Admitting: Pharmacist

## 2020-09-07 VITALS — BP 134/80 | HR 71 | Ht <= 58 in | Wt 225.0 lb

## 2020-09-07 DIAGNOSIS — M5136 Other intervertebral disc degeneration, lumbar region: Secondary | ICD-10-CM

## 2020-09-07 DIAGNOSIS — Z8673 Personal history of transient ischemic attack (TIA), and cerebral infarction without residual deficits: Secondary | ICD-10-CM

## 2020-09-07 DIAGNOSIS — Z8669 Personal history of other diseases of the nervous system and sense organs: Secondary | ICD-10-CM

## 2020-09-07 DIAGNOSIS — Z8639 Personal history of other endocrine, nutritional and metabolic disease: Secondary | ICD-10-CM

## 2020-09-07 DIAGNOSIS — C50911 Malignant neoplasm of unspecified site of right female breast: Secondary | ICD-10-CM

## 2020-09-07 DIAGNOSIS — M0609 Rheumatoid arthritis without rheumatoid factor, multiple sites: Secondary | ICD-10-CM | POA: Diagnosis not present

## 2020-09-07 DIAGNOSIS — Z5181 Encounter for therapeutic drug level monitoring: Secondary | ICD-10-CM

## 2020-09-07 DIAGNOSIS — Z79899 Other long term (current) drug therapy: Secondary | ICD-10-CM

## 2020-09-07 DIAGNOSIS — Z111 Encounter for screening for respiratory tuberculosis: Secondary | ICD-10-CM

## 2020-09-07 DIAGNOSIS — Z96652 Presence of left artificial knee joint: Secondary | ICD-10-CM | POA: Diagnosis not present

## 2020-09-07 DIAGNOSIS — M19071 Primary osteoarthritis, right ankle and foot: Secondary | ICD-10-CM

## 2020-09-07 DIAGNOSIS — M19072 Primary osteoarthritis, left ankle and foot: Secondary | ICD-10-CM

## 2020-09-07 DIAGNOSIS — M1711 Unilateral primary osteoarthritis, right knee: Secondary | ICD-10-CM

## 2020-09-07 DIAGNOSIS — Z8679 Personal history of other diseases of the circulatory system: Secondary | ICD-10-CM

## 2020-09-07 NOTE — Patient Instructions (Signed)
Vaccines You are taking a medication(s) that can suppress your immune system.  The following immunizations are recommended: Flu annually Covid-19  Td/Tdap (tetanus, diphtheria, pertussis) every 10 years Pneumonia (Prevnar 15 then Pneumovax 23 at least 1 year apart.  Alternatively, can take Prevnar 20 without needing additional dose) Shingrix: 2 doses from 4 weeks to 6 months apart  Please check with your PCP to make sure you are up to date.   Standing Labs We placed an order today for your standing lab work.   Please have your standing labs drawn in 1 month then every 3 months  If possible, please have your labs drawn 2 weeks prior to your appointment so that the provider can discuss your results at your appointment.  Please note that you may see your imaging and lab results in [place] before we have reviewed them. We may be awaiting multiple results to interpret others before contacting you. Please allow our office up to 72 hours to thoroughly review all of the results before contacting the office for clarification of your results.  We have open lab daily: Monday through Thursday from 1:30-4:30 PM and Friday from 1:30-4:00 PM at the office of Dr. Bo Merino, [facility].   Please be advised, all patients with office appointments requiring lab work will take precedent over walk-in lab work.  If possible, please come for your lab work on Monday and Friday afternoons, as you may experience shorter wait times. The office is located at [address], [place], [place], [place] [address] No appointment is necessary.   Labs are drawn by [facility]. Please bring your co-pay at the time of your lab draw.  You may receive a bill from [facility] for your lab work.  If you wish to have your labs drawn at another location, please call the office 24 hours in advance to send orders.  If you have any questions regarding directions or hours of operation,  please call [phone].   As  a reminder, please drink plenty of water prior to coming for your lab work. Thanks!    Abatacept solution for injection (subcutaneous or intravenous use) What is this medication? ABATACEPT (a ba TA sept) is used to treat rheumatoid arthritis, psoriatic arthritis, and juvenile idiopathic arthritis. It prevents acute graft-versus-host disease following a stem-cell transplant. This medicine may be used for other purposes; ask your health care provider or pharmacist if you have questions. COMMON BRAND NAME(S): Orencia What should I tell my care team before I take this medication? They need to know if you have any of these conditions: cancer diabetes hepatitis B or history of hepatitis B infection immune system problems infection or history of infection (especially a virus infection such as chickenpox, cold sores, or herpes) lung or breathing problems, like chronic obstructive pulmonary disease (COPD) recently received or scheduled to receive a vaccination scheduled to have surgery tuberculosis, a positive skin test for tuberculosis, or have recently been in close contact with someone who has tuberculosis an unusual or allergic reaction to abatacept, other medicines, foods, dyes, or preservatives pregnant or trying to get pregnant breast-feeding How should I use this medication? This medicine is for infusion into a vein or for injection under the skin. Infusions are given by a health care professional in a hospital or clinic setting. If you are to give your own medicine at home, you will be taught how to prepare and give this medicine under the skin. Use exactly as directed. Take your medicine at regular  intervals. Do not take your medicine more often than directed. It is important that you put your used needles and syringes in a special sharps container. Do not put them in a trash can. If you do not have a sharps container, call your pharmacist or health care provider to get one. Talk to your  pediatrician regarding the use of this medicine in children. While infusions in a clinic may be prescribed for children as young as 2 years for selected conditions, precautions do apply. Overdosage: If you think you have taken too much of this medicine contact a poison control center or emergency room at once. NOTE: This medicine is only for you. Do not share this medicine with others. What if I miss a dose? This medicine is used once a week if given by injection under the skin. If you miss a dose, take it as soon as you can. If it is almost time for your next dose, take only that dose. Do not take double or extra doses. If you are to be given an infusion of this medicine, it is important not to miss your dose. Doses are usually every 4 weeks. Call your doctor or health care professional if you are unable to keep an appointment. What may interact with this medication? Do not take this medicine with any of the following medications: live vaccines This medicine may also interact with the following medications: anakinra baricitinib canakinumab medicines that lower your chance of fighting an infection rituximab TNF blockers such as adalimumab, certolizumab, etanercept, golimumab, infliximab tocilizumab tofacitinib upadacitinib ustekinumab This list may not describe all possible interactions. Give your health care provider a list of all the medicines, herbs, non-prescription drugs, or dietary supplements you use. Also tell them if you smoke, drink alcohol, or use illegal drugs. Some items may interact with your medicine. What should I watch for while using this medication? Visit your doctor for regular checks on your progress. Tell your doctor or health care professional if your symptoms do not start to get better or if they get worse. You will be tested for tuberculosis (TB) before you start this medicine. If your doctor prescribed any medicine for TB, you should start taking the TB medicine  before starting this medicine. Make sure to finish the full course of TB medicine. This medicine may increase your risk of getting an infection. Call your doctor or health care professional if you get fever, chills, or sore throat, or other symptoms of a cold or flu. Do not treat yourself. Try to avoid being around people who are sick. If you have diabetes and are getting this medicine in a vein, the infusion can give false high blood sugar readings on the day of your dose. This may happen if you use certain types of blood glucose tests. Your health care provider may tell you to use a different way to monitor your blood sugar levels. What side effects may I notice from receiving this medication? Side effects that you should report to your doctor or health care professional as soon as possible: allergic reactions like skin rash, itching or hives, swelling of the face, lips, or tongue breathing problems chest pain dizziness signs and symptoms of infection like fever; chills; cough; sore throat; pain or trouble passing urine unusually weak or tired Side effects that usually do not require medical attention (report to your doctor or health care professional if they continue or are bothersome): diarrhea headache nausea pain, redness, or irritation at site where injected stomach  pain or upset This list may not describe all possible side effects. Call your doctor for medical advice about side effects. You may report side effects to FDA at 1-800-FDA-1088. Where should I keep my medication? Infusions will be given in a hospital or clinic and will not be stored at home. Storage for syringes and autoinjectors stored at home: Keep out of the reach of children. Store in a refrigerator between 2 and 8 degrees C (36 and 46 degrees F). Keep this medicine in the original container. Protect from light. Do not freeze. Do not shake. Throw away any unused medicine after the expiration date. NOTE: This sheet is a  summary. It may not cover all possible information. If you have questions about this medicine, talk to your doctor, pharmacist, or health care provider.  2022 Elsevier/Gold Standard (2020-01-15 15:20:39)

## 2020-09-07 NOTE — Progress Notes (Signed)
Pharmacy Note  Subjective: Patient presents today to [facility] for follow up office visit.   Patient seen by the pharmacist for counseling on subcutaneous Orencia for rheumatoid arthritis.   Prior therapy includes: Humira - stopped due to chemotherapy followed by radiation therapy for invasive breast cancer. Her last Humira dose was on 01/18/20. She was last seen on 08/29/20 to discuss restarting Humira however her oncologist, Dr. Marcello Moores, preferred to avoid TNF inhibitors d/t CV risk. Patient has longstanding HTN (currently BP medication adjustment is in process) that puts her at cardiotoxicity risk.  Objective: CBC    Component Value Date/Time   WBC 8.6 11/16/2019 1139   WBC 6.4 10/31/2018 1027   RBC 3.97 11/16/2019 1139   RBC 3.88 10/31/2018 1027   HGB 11.4 11/16/2019 1139   HCT 34.4 11/16/2019 1139   PLT 285 11/16/2019 1139   MCV 87 11/16/2019 1139   MCH 28.7 11/16/2019 1139   MCH 29.1 10/31/2018 1027   MCHC 33.1 11/16/2019 1139   MCHC 32.8 10/31/2018 1027   RDW 12.5 11/16/2019 1139   LYMPHSABS 2.1 11/16/2019 1139   MONOABS 476 05/11/2016 1127   EOSABS 0.2 11/16/2019 1139   BASOSABS 0.1 11/16/2019 1139    CMP     Component Value Date/Time   NA 147 (H) 11/16/2019 1139   K 4.0 11/16/2019 1139   CL 109 (H) 11/16/2019 1139   CO2 25 11/16/2019 1139   GLUCOSE 95 11/16/2019 1139   GLUCOSE 87 10/31/2018 1027   BUN 15 11/16/2019 1139   CREATININE 1.03 (H) 11/16/2019 1139   CREATININE 1.13 (H) 10/31/2018 1027   CALCIUM 8.9 11/16/2019 1139   PROT 6.9 11/16/2019 1139   ALBUMIN 3.8 11/16/2019 1139   AST 17 11/16/2019 1139   ALT 9 11/16/2019 1139   ALKPHOS 129 (H) 11/16/2019 1139   BILITOT 0.2 11/16/2019 1139   GFRNONAA 54 (L) 11/16/2019 1139   GFRNONAA 49 (L) 10/31/2018 1027   GFRAA 62 11/16/2019 1139   GFRAA 56 (L) 10/31/2018 1027    Baseline Immunosuppressant Therapy Labs TB GOLD Quantiferon TB Gold Latest Ref Rng & Units 11/16/2019  Quantiferon TB Gold  Plus Negative Negative   Hepatitis Panel (11/03/2008) -reviewed from [facility] Hepatitis B surface antigen - negative Hepatitis B core antibody, IgM - negative Hepatitis A antibody, IgM - negative Hepatitis C antibody - negative  SPEP Serum Protein Electrophoresis Latest Ref Rng & Units 11/16/2019  Total Protein 6.0 - 8.5 g/dL 6.9   SPEP from 10/12/2008 reviewed from [facility] Total protein 7.5 g/dL Albumin: 51.9 (L) Alpha 1: 5.6 (H) Alpha 2: 10.3 Beta 1: 7.3 (H) Beta 2: 6.0 Gamma: 18.9 (H) M-spike not detected  Chest x-ray:  01/06/15 - no active cardiopulmonary disease  Does patient have a diagnosis of COPD? No  Does patient have history of diverticulitis?  No  Assessment/Plan:  Maureen Chatters does not have the same CV risk as TNF inhibitors therefore it is an appropriate alternative therapy option for her.  Copy of immunosuppressive labs sent  Counseled patient that Maureen Chatters is a selective T-cell costimulation blocker.  Counseled patient on purpose, proper use, and adverse effects of Orencia. The most common adverse effects are increased risk of infections, headache, and injection site reactions.  There is the possibility of an increased risk of malignancy but it is not well understood if this increased risk is due to the medication or the disease state. Reviewed risk of GI perforation which is higher in patients with diverticulitis and diabetes.  Counseled patient that Maureen Chatters should be held prior to scheduled surgery.  Counseled patient to avoid live vaccines while on Orencia.  Recommend annual influenza, Pneumovax 23, Prevnar 13, and Shingrix as indicated.  Vaccine information placed in AVS to review  Reviewed the importance of regular labs while on Orencia therapy. Patient will be due for labs 1 month after starting therapy. Standing orders placed. Provided patient with medication education material and answered all questions.  Patient consented to [facility].  Will upload consent into patient's  chart.  Will apply for Orencia through patient's insurance.  Reviewed storage information for Orencia.  Advised initial injection must be administered in office.    Patient dose will be Orencia 125 mg every 7 days.  Prescription pending lab results and/or insurance approval.  Patient is comfortable with $100 copay for Orencia but would like to pursue patient assistance. She was provided with patient portion of patient assistance application and advised to collect income documents and out-of-pocket rx costs for 2022. She verbalized understanding and will plan to mail to clinic once completed. She is comfortable with starting Orencia prior to approval from patient assistance  Orders Placed This Encounter  Procedures   QuantiFERON-TB Gold Plus    Standing Status:   Future    Standing Expiration Date:   09/06/2021   HIV Antibody (routine testing w rflx)   IgG, IgA, IgM    Knox Saliva, PharmD, MPH, BCPS Clinical Pharmacist (Rheumatology and Pulmonology)

## 2020-09-07 NOTE — Telephone Encounter (Addendum)
Please start Orencia BIV.  Dose: '125mg'$  every 7 days  Dx: Rheumatoid arthritis (M05.9)  Previously tried therapies: Humira - stopped due chemotherapy followed by radiation therapy. Stopped on 01/18/20. Oncology would like to hold on restarting Humira d/t CV risk concerns   Therapies patient unable to try: TNF inhibitors - Enbrel, Humira, Cimzia, Simponi, Remicade  Patient is comfortable with $100 copay for Orencia but would like to pursue patient assistance. She was provided with patient portion of patient assistance application and advised to collect income documents and out-of-pocket rx costs for 2022. She verbalized understanding and will plan to mail to clinic once completed. She is comfortable with starting Orencia prior to approval from patient assistance. Provider portion signed and will file in awaiting info folder at pharmacy office  Knox Saliva, PharmD, MPH, BCPS Clinical Pharmacist (Rheumatology and Pulmonology)

## 2020-09-08 LAB — IGG, IGA, IGM
IgG (Immunoglobin G), Serum: 1509 mg/dL (ref 600–1540)
IgM, Serum: 54 mg/dL (ref 50–300)
Immunoglobulin A: 570 mg/dL — ABNORMAL HIGH (ref 70–320)

## 2020-09-08 LAB — HIV ANTIBODY (ROUTINE TESTING W REFLEX): HIV 1&2 Ab, 4th Generation: NONREACTIVE

## 2020-09-08 NOTE — Telephone Encounter (Signed)
Submitted a Prior Authorization request to [facility] for [facility] via CoverMyMeds. Will update once we receive a response.   Key: [id number]

## 2020-09-08 NOTE — Progress Notes (Signed)
IgA is slightly elevated. Rest of immunoglobulins WNL.  HIV negative. Ok to proceed with orencia.

## 2020-09-09 ENCOUNTER — Other Ambulatory Visit (HOSPITAL_COMMUNITY): Payer: Self-pay

## 2020-09-09 NOTE — Telephone Encounter (Signed)
Received notification from [facility] regarding a prior authorization for [facility]. Authorization has been APPROVED from 01/09/2020 to 01/07/2021.   Per test claim, copay for 28 days supply is the expected $100 that the patient already claimed to be amenable to.  Patient may fill through [facility]: [phone]   Authorization # [id number]

## 2020-09-13 NOTE — Telephone Encounter (Signed)
Patient scheduled for Orencia new start on 09/15/20 @ 10:30am. She has been advised to bring Orencia BMS patient assistance paperwork with her to visit. She is collecting prescription out-of-pocket cost form today from pharmacy and will try to collect remain income documents by new start visit.  She states that she will call back to r/s appt if her son is unable to bring her.  Knox Saliva, PharmD, MPH, BCPS Clinical Pharmacist (Rheumatology and Pulmonology)

## 2020-09-15 ENCOUNTER — Other Ambulatory Visit: Payer: Self-pay

## 2020-09-15 ENCOUNTER — Other Ambulatory Visit (HOSPITAL_COMMUNITY): Payer: Self-pay

## 2020-09-15 ENCOUNTER — Ambulatory Visit: Payer: Medicare PPO | Admitting: Pharmacist

## 2020-09-15 VITALS — BP 126/68 | HR 78

## 2020-09-15 DIAGNOSIS — M0609 Rheumatoid arthritis without rheumatoid factor, multiple sites: Secondary | ICD-10-CM

## 2020-09-15 DIAGNOSIS — Z7189 Other specified counseling: Secondary | ICD-10-CM

## 2020-09-15 DIAGNOSIS — Z79899 Other long term (current) drug therapy: Secondary | ICD-10-CM

## 2020-09-15 MED ORDER — ORENCIA CLICKJECT 125 MG/ML ~~LOC~~ SOAJ
125.0000 mg | SUBCUTANEOUS | 0 refills | Status: DC
  Filled 2020-09-15: qty 12, fill #0
  Filled 2020-09-19: qty 4, 28d supply, fill #0
  Filled 2020-10-13: qty 4, 28d supply, fill #1
  Filled 2020-11-22: qty 4, 28d supply, fill #2

## 2020-09-15 NOTE — Progress Notes (Signed)
Pharmacy Note  Subjective:   Patient presents to clinic today to receive first dose of Orencia. She was previously taking Humira and stopped on 01/18/20 due to chemotherapy followed by radiation therapy.Her oncology and cardiology team would like to hold on restarting Humira. Decision was made to start Orencia d/t minimal cardiovascular risks  Patient running a fever or have signs/symptoms of infection? No  Patient currently on antibiotics for the treatment of infection? No  Patient have any upcoming invasive procedures/surgeries? No  Objective: CMP     Component Value Date/Time   NA 147 (H) 11/16/2019 1139   K 4.0 11/16/2019 1139   CL 109 (H) 11/16/2019 1139   CO2 25 11/16/2019 1139   GLUCOSE 95 11/16/2019 1139   GLUCOSE 87 10/31/2018 1027   BUN 15 11/16/2019 1139   CREATININE 1.03 (H) 11/16/2019 1139   CREATININE 1.13 (H) 10/31/2018 1027   CALCIUM 8.9 11/16/2019 1139   PROT 6.9 11/16/2019 1139   ALBUMIN 3.8 11/16/2019 1139   AST 17 11/16/2019 1139   ALT 9 11/16/2019 1139   ALKPHOS 129 (H) 11/16/2019 1139   BILITOT 0.2 11/16/2019 1139   GFRNONAA 54 (L) 11/16/2019 1139   GFRNONAA 49 (L) 10/31/2018 1027   GFRAA 62 11/16/2019 1139   GFRAA 56 (L) 10/31/2018 1027    CBC    Component Value Date/Time   WBC 8.6 11/16/2019 1139   WBC 6.4 10/31/2018 1027   RBC 3.97 11/16/2019 1139   RBC 3.88 10/31/2018 1027   HGB 11.4 11/16/2019 1139   HCT 34.4 11/16/2019 1139   PLT 285 11/16/2019 1139   MCV 87 11/16/2019 1139   MCH 28.7 11/16/2019 1139   MCH 29.1 10/31/2018 1027   MCHC 33.1 11/16/2019 1139   MCHC 32.8 10/31/2018 1027   RDW 12.5 11/16/2019 1139   LYMPHSABS 2.1 11/16/2019 1139   MONOABS 476 05/11/2016 1127   EOSABS 0.2 11/16/2019 1139   BASOSABS 0.1 11/16/2019 1139    Baseline Immunosuppressant Therapy Labs TB GOLD Quantiferon TB Gold Latest Ref Rng & Units 11/16/2019  Quantiferon TB Gold Plus Negative Negative   Hepatitis Panel   HIV Lab Results  Component  Value Date   HIV NON-REACTIVE 09/07/2020   Immunoglobulins Immunoglobulin Electrophoresis Latest Ref Rng & Units 09/07/2020  IgA  70 - 320 mg/dL 570(H)  IgG 600 - 1,540 mg/dL 1,509  IgM 50 - 300 mg/dL 54   SPEP Serum Protein Electrophoresis Latest Ref Rng & Units 11/16/2019  Total Protein 6.0 - 8.5 g/dL 6.9   G6PD No results found for: G6PDH TPMT No results found for: TPMT   Chest x-ray: 01/06/15 - no active cardiopulmonary disease  Assessment/Plan:  Demonstrated proper injection technique with Orencia demo device  Patient able to demonstrate proper injection technique using the teach back method.  Patient self injected in the left lower abdomen with:  Sample Medication: Orencia Clickject '125mg'$ /mL NDC: [id number]: [id number] Expiration: 10/2021  Patient tolerated well.  Observed for 30 mins in office for adverse reaction and none noted.   Patient is to return in 1 month for labs and 6-8 weeks for follow-up appointment.  Standing orders placed.   Orencia approved through insurance .  Patient is amenable to paying $100 per month copay through insurance and is in the process of gathering income documentation for patient assistance program.  Rx sent to: [facility]: [phone] .  Patient provided with pharmacy phone number and advised to call later this week to schedule shipment  to home.  All questions encouraged and answered.  Instructed patient to call with any further questions or concerns.  Knox Saliva, PharmD, MPH, BCPS Clinical Pharmacist (Rheumatology and Pulmonology)  09/15/2020 10:59 AM

## 2020-09-15 NOTE — Patient Instructions (Signed)
Your next Orencia dose is due on 9/15, 9/22, and every 7 days thereafter  Your prescription will be shipped from [facility]. Their phone number is [phone] They will call to schedule shipment and collect payment  Labs are due in 1 month then every 3 months. Lab hours are from Monday to Thursday 1:30-4:30pm and Friday 1:30-4pm. You do not need an appointment if you come for labs during these times.  How to manage an injection site reaction: Remember the 5 C's: COUNTER - leave on the counter at least 30 minutes but up to overnight to bring medication to room temperature. This may help prevent stinging COLD - place something cold (like an ice gel pack or cold water bottle) on the injection site just before cleansing with alcohol. This may help reduce pain CLARITIN - use Claritin (generic name is loratadine) for the first two weeks of treatment or the day of, the day before, and the day after injecting. This will help to minimize injection site reactions CORTISONE CREAM - apply if injection site is irritated and itching CALL ME - if injection site reaction is bigger than the size of your fist, looks infected, blisters, or if you develop hives

## 2020-09-16 ENCOUNTER — Other Ambulatory Visit (HOSPITAL_COMMUNITY): Payer: Self-pay

## 2020-09-16 NOTE — Telephone Encounter (Signed)
Delivery instructions have been updated in [place], medication will be shipped to patient's home address on 9/13.  Rx has been processed in [facility] and there is a copay of $100. Payment information has been collected and forwarded to the pharmacy.

## 2020-09-19 ENCOUNTER — Other Ambulatory Visit (HOSPITAL_COMMUNITY): Payer: Self-pay

## 2020-10-07 ENCOUNTER — Other Ambulatory Visit (HOSPITAL_COMMUNITY): Payer: Self-pay

## 2020-10-07 ENCOUNTER — Other Ambulatory Visit: Payer: Self-pay | Admitting: Physician Assistant

## 2020-10-07 DIAGNOSIS — Z5181 Encounter for therapeutic drug level monitoring: Secondary | ICD-10-CM

## 2020-10-07 DIAGNOSIS — Z79899 Other long term (current) drug therapy: Secondary | ICD-10-CM

## 2020-10-10 NOTE — Telephone Encounter (Signed)
Next Visit: 11/08/2020  Last Visit: 09/07/2020  UDS:04/15/2020  Narc Agreement: 04/11/2020  Last Fill: 09/05/2020 Tramadol 50 mg 1 to 2 tablets by mouth daily PRN for pain relief  Okay to refill Tramadol?

## 2020-10-13 ENCOUNTER — Other Ambulatory Visit (HOSPITAL_COMMUNITY): Payer: Self-pay

## 2020-10-25 NOTE — Progress Notes (Signed)
Office Visit Note  Patient: [name]             Date of Birth: 02/04/1945           MRN: [id number]             PCP: Ann Held, MD Referring: Ann Held, MD Visit Date: 11/08/2020 Occupation: @GUAROCC @  Subjective:  Pain in both hands.   History of Present Illness: Keyuna S Mysliwiec is a 75 y.o. female with a history of seronegative rheumatoid arthritis and osteoarthritis.  She states she has been continues to have some discomfort in the left wrist and in her hands.  She has been taking Orencia injections on a regular basis without missing any doses.  She continues to have some discomfort from osteoarthritis.  Her left total knee replacement is doing well.  She continues to have some lower back pain.  She has been taking tramadol 50 mg 1 to 2 tablets a day as needed for pain management.  She states without tramadol her pain level on 0-10 is about 6-7 and with tramadol from 0-1.  She states she is able to function better when she is taking tramadol.  Activities of Daily Living:  Patient reports morning stiffness for 10-15 minutes.   Patient Reports nocturnal pain.  Difficulty dressing/grooming: Denies Difficulty climbing stairs: Reports Difficulty getting out of chair: Reports Difficulty using hands for taps, buttons, cutlery, and/or writing: Reports  Review of Systems  Constitutional:  Positive for fatigue.  HENT:  Negative for mouth sores, mouth dryness and nose dryness.   Eyes:  Negative for pain, itching and dryness.  Respiratory:  Positive for shortness of breath. Negative for difficulty breathing.        With activity   Cardiovascular:  Negative for chest pain and palpitations.  Gastrointestinal:  Negative for blood in stool, constipation and diarrhea.  Endocrine: Negative for increased urination.  Genitourinary:  Negative for difficulty urinating.  Musculoskeletal:  Positive for joint pain, joint pain and morning stiffness. Negative for joint swelling,  myalgias, muscle tenderness and myalgias.  Skin:  Negative for color change, rash and redness.  Allergic/Immunologic: Negative for susceptible to infections.  Neurological:  Positive for dizziness and numbness. Negative for headaches, memory loss and weakness.  Hematological:  Negative for bruising/bleeding tendency.  Psychiatric/Behavioral:  Negative for confusion.    PMFS History:  Patient Active Problem List   Diagnosis Date Noted   Unilateral primary osteoarthritis, right knee 11/18/2018   Closed fracture of shaft of metatarsal bone 07/30/2016   Metatarsal fracture 07/30/2016   Displaced fracture of second metatarsal bone, right foot, initial encounter for closed fracture 07/26/2016   Arthritis of shoulder 04/10/2016   High risk medication use 03/16/2016   Primary osteoarthritis of both feet 03/16/2016   Primary osteoarthritis of both knees 03/16/2016   DDD (degenerative disc disease), lumbar 03/16/2016   OSA on CPAP 07/27/2015   Lip numbness 03/19/2015   TIA (transient ischemic attack) 03/19/2015   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[place]) 02/06/2015   Post op infection 02/05/2015   History of lumbar laminectomy for spinal cord decompression 01/17/2015   Lumbar spinal stenosis 01/17/2015   Primary osteoarthritis of left knee 03/25/2014   Rheumatoid arthritis of multiple sites with negative rheumatoid factor ([place]) 03/25/2014   History of total knee replacement, left 03/25/2014   DERMATITIS 09/28/2008   CONSTIPATION 05/25/2008   DYSPNEA ON EXERTION 05/25/2008   ANEMIA, NORMOCYTIC 04/13/2008   ANKLE PAIN, BILATERAL 11/25/2007  HLD (hyperlipidemia) 08/14/2006   Morbid (severe) obesity due to excess calories ([place]) 12/13/2005   CARPAL TUNNEL SYNDROME 12/13/2005   PERIPHERAL NEUROPATHY 12/13/2005   Essential hypertension 12/13/2005   DYSRHYTHMIA, CARDIAC NOS 12/13/2005    Past Medical History:  Diagnosis Date   Anemia    Arthritis    Rheumatoid, followed by Dr. Estanislado Pandy, knees &  ankles    Cancer ([place])    CHF (congestive heart failure) ([place])    pt not aware of this   Constipation    GERD (gastroesophageal reflux disease)    H/O cardiovascular stress test 02/2014   seen by Dr. Einar Gip - told that she was cleared for surgery   Heart murmur    states she's never had any problems   Hypertension    Leg cramps    Shortness of breath dyspnea    with exertion   Sleep apnea    CPAP in use- QO night, study done at [facility]- 2007    Stroke [facility]) 03/2015   TIA    Family History  Problem Relation Age of Onset   Seizures Mother    Transient ischemic attack Mother    Hypertension Mother    Diabetes Mother    Congestive Heart Failure Mother    Arthritis/Rheumatoid Mother    Parkinson's disease Mother    Dementia Mother    Heart disease Father        CHF   Stroke Sister    Depression Sister    Arthritis Brother    Arthritis Brother    Arthritis Son    Hypertension Son    Past Surgical History:  Procedure Laterality Date   ABDOMINAL HYSTERECTOMY     CARPAL TUNNEL RELEASE Right    COLONOSCOPY     COLONOSCOPY WITH PROPOFOL N/A 11/13/2019   Procedure: COLONOSCOPY WITH PROPOFOL;  Surgeon: Carol Ada, MD;  Location: [facility];  Service: Endoscopy;  Laterality: N/A;   ESOPHAGOGASTRODUODENOSCOPY (EGD) WITH PROPOFOL N/A 11/13/2019   Procedure: ESOPHAGOGASTRODUODENOSCOPY (EGD) WITH PROPOFOL;  Surgeon: Carol Ada, MD;  Location: [facility];  Service: Endoscopy;  Laterality: N/A;   FOOT SURGERY Bilateral    bunionectomy, calluses , also has several pins & screws   HAND SURGERY     JOINT REPLACEMENT     planned for 03/25/2014   KNEE ARTHROPLASTY     KNEE SURGERY  1990's    LUMBAR LAMINECTOMY/DECOMPRESSION MICRODISCECTOMY N/A 01/17/2015   Procedure: L2-3, L3-4 Decompression;  Surgeon: [name], MD;  Location: [place];  Service: Orthopedics;  Laterality: N/A;   LUMBAR LAMINECTOMY/DECOMPRESSION MICRODISCECTOMY N/A 02/06/2015   Procedure: I AND D LUMBAR WITH WOUND  VAC PLACEMENT;  Surgeon: [name], MD;  Location: [place];  Service: Orthopedics;  Laterality: N/A;   MASTECTOMY Right 02/01/2020   ORIF TOE FRACTURE Right 07/30/2016   Procedure: OPEN REDUCTION INTERNAL FIXATION (ORIF) RIGHT 2ND METATARSAL (TOE) FRACTURE;  Surgeon: [name], MD;  Location: [place];  Service: Orthopedics;  Laterality: Right;   POLYPECTOMY  11/13/2019   Procedure: POLYPECTOMY;  Surgeon: Carol Ada, MD;  [facility];  Service: Endoscopy;;   TOTAL KNEE ARTHROPLASTY Left 03/25/2014   Procedure: TOTAL KNEE ARTHROPLASTY;  Surgeon: Garald Balding, MD;  Location: [place];  Service: Orthopedics;  Laterality: Left;   TUBAL LIGATION     Social History   Social History Narrative   Lives with husband.  2 Sons.  Using walker.    Caffeine  Less then 1 cups daily.  Retired.     Immunization History  Administered Date(s) Administered   H1N1 12/23/2007   Influenza Split 11/03/2014   Influenza Whole 12/19/2005, 11/13/2006   Influenza,inj,Quad PF,6+ Mos 09/16/2017   Influenza-Unspecified 11/03/2014   PFIZER Comirnaty(Gray Top)Covid-19 Tri-Sucrose Vaccine 01/28/2019, 02/18/2019, 10/07/2019   PFIZER(Purple Top)SARS-COV-2 Vaccination 01/28/2019, 02/18/2019   Pneumococcal Polysaccharide-23 12/19/2005   Tdap 12/20/2011     Objective: Vital Signs: BP 132/80 (BP Location: Left Arm, Patient Position: Sitting, Cuff Size: Large)   Pulse 76   Ht 4\' 10"  (1.473 m)   Wt 225 lb (102.1 kg)   BMI 47.03 kg/m    Physical Exam Vitals and nursing note reviewed.  Constitutional:      Appearance: She is well-developed.  HENT:     Head: Normocephalic and atraumatic.  Eyes:     Conjunctiva/sclera: Conjunctivae normal.  Cardiovascular:     Rate and Rhythm: Normal rate and regular rhythm.     Heart sounds: Normal heart sounds.  Pulmonary:     Effort: Pulmonary effort is normal.     Breath sounds: Normal breath sounds.  Abdominal:     General: Bowel sounds are normal.      Palpations: Abdomen is soft.  Musculoskeletal:     Cervical back: Normal range of motion.  Lymphadenopathy:     Cervical: No cervical adenopathy.  Skin:    General: Skin is warm and dry.     Capillary Refill: Capillary refill takes less than 2 seconds.  Neurological:     Mental Status: She is alert and oriented to person, place, and time.  Psychiatric:        Behavior: Behavior normal.     Musculoskeletal Exam: C-spine was in good range of motion.  She had limited range of motion of bilateral shoulder joints with abduction up to 90 degrees and limited internal rotation.  Elbow joints with good range of motion.  She synovial thickening over MCPs and PIPs and DIPs but no synovitis was noted.  Hip joints were difficult to assess in the sitting position.  She mobilizes with the help of a walker.  Knee joints with good range of motion with no synovitis.  There was no tenderness over ankles and MTPs.  CDAI Exam: CDAI Score: 1.8  Patient Global: 4 mm; Provider Global: 4 mm Swollen: 0 ; Tender: 1  Joint Exam 11/08/2020      Right  Left  Wrist      Tender     Investigation: No additional findings.  Imaging: No results found.  Recent Labs: Lab Results  Component Value Date   WBC 8.6 11/16/2019   HGB 11.4 11/16/2019   PLT 285 11/16/2019   NA 147 (H) 11/16/2019   K 4.0 11/16/2019   CL 109 (H) 11/16/2019   CO2 25 11/16/2019   GLUCOSE 95 11/16/2019   BUN 15 11/16/2019   CREATININE 1.03 (H) 11/16/2019   BILITOT 0.2 11/16/2019   ALKPHOS 129 (H) 11/16/2019   AST 17 11/16/2019   ALT 9 11/16/2019   PROT 6.9 11/16/2019   ALBUMIN 3.8 11/16/2019   CALCIUM 8.9 11/16/2019   GFRAA 62 11/16/2019   QFTBGOLDPLUS Negative 11/16/2019    Speciality Comments: Narcotic Agreement 10/30/16 Humira stopped 01/18/20 d/t chemo/radiation Orencia started 09/15/20  Procedures:  No procedures performed Allergies: Ramipril   Assessment / Plan:     Visit Diagnoses: Rheumatoid arthritis of multiple  sites with negative rheumatoid factor ([place]) -she continues to have some discomfort.  No synovitis was noted.  She  synovial thickening over MCPs and PIPs and DIPs.  Plan: Sedimentation rate  High risk medication use - Orencia 125 mg 7 days injections once weekly.  Previously held Humira prior to starting Chemotherapy and radiation therapy - Plan: CBC with Differential/Platelet, COMPLETE METABOLIC PANEL WITH GFR, QuantiFERON-TB Gold Plus today and then every 3 months to monitor for drug toxicity.  A handout on immunization was given.  She was also advised to stop Orencia in case she develops an infection.  History of total knee replacement, left-doing well.  Unilateral primary osteoarthritis, right knee-she had good range of motion although she has difficulty mobilizing and uses a walker.  Primary osteoarthritis of both feet-no synovitis was noted.  DDD (degenerative disc disease), lumbar-she has chronic discomfort.  Therapeutic drug monitoring -hand pain level is very well controlled with tramadol.  She is on tramadol 50 mg 1 to 2 tablets by mouth daily PRN for pain relief. 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 CTX, RTX. On Herceptin and Letrozole.  T[facility] at [facility].  History of TIA (transient ischemic attack)  History of hypertension-blood pressure was normal today.  BMI is 47.03.  Increased risk of heart disease with rheumatoid arthritis was discussed.  Dietary modifications and exercises were emphasized.  A handout was placed in the AVS.  History of hyperlipidemia  History of sleep apnea  Orders: Orders Placed This Encounter  Procedures   DRUG MONITOR, TRAMADOL,QN, URINE   DRUG MONITOR, PANEL 5, W/CONF, URINE   CBC with Differential/Platelet   COMPLETE METABOLIC PANEL WITH GFR    Sedimentation rate   QuantiFERON-TB Gold Plus    No orders of the defined types were placed in this encounter.    Follow-Up Instructions: Return in about 3 months (around 02/08/2021) for Rheumatoid arthritis, Osteoarthritis.   Bo Merino, MD  Note - This record has been created using Editor, commissioning.  Chart creation errors have been sought, but may not always  have been located. Such creation errors do not reflect on  the standard of medical care.

## 2020-10-26 ENCOUNTER — Other Ambulatory Visit (HOSPITAL_COMMUNITY): Payer: Self-pay

## 2020-10-31 ENCOUNTER — Ambulatory Visit: Payer: Medicare PPO | Admitting: Physician Assistant

## 2020-11-01 ENCOUNTER — Other Ambulatory Visit (HOSPITAL_COMMUNITY): Payer: Self-pay

## 2020-11-01 ENCOUNTER — Telehealth: Payer: Self-pay | Admitting: Pharmacist

## 2020-11-01 NOTE — Telephone Encounter (Signed)
Submitted Patient Assistance Application to [employer] for [facility] along with provider portion, signed patient portion, PA and income documents. Will update patient when we receive a response.  Fax# [phone] Phone# [phone]   Patient has been paying $100 copay and filling with [facility]. We had discussed pursuing PAP and seeing if she was approved. If she is denied, she is amenable to continue paying $100  Knox Saliva, PharmD, MPH, BCPS Clinical Pharmacist (Rheumatology and Pulmonology)

## 2020-11-02 NOTE — Telephone Encounter (Signed)
Received request from [employer] to resubmit PAP application with pt's DoB added to prescriber page. Added missing information and refaxed. Will continue to await determination.

## 2020-11-03 NOTE — Telephone Encounter (Signed)
Received fax with denial for Orencia PAP from [employer] - patient's household income is over current eligibility criteria. She could provide addiitonal income documents - tax return (first page showing adjusted gross income), and SS, 1099.   Provider's name is incorrect. Requested updated fax with Hazel Sams, PA-C's name.  Knox Saliva, PharmD, MPH, BCPS Clinical Pharmacist (Rheumatology and Pulmonology)

## 2020-11-03 NOTE — Telephone Encounter (Signed)
Spoke with patient regarding Orencia [employer] PAP application denial and option to appeal by submitting additional income documents. She states she is not interested and will continue to pay $100 per month to pay for Orencia through [facility].  Knox Saliva, PharmD, MPH, BCPS Clinical Pharmacist (Rheumatology and Pulmonology)

## 2020-11-04 ENCOUNTER — Other Ambulatory Visit (HOSPITAL_COMMUNITY): Payer: Self-pay

## 2020-11-07 ENCOUNTER — Other Ambulatory Visit: Payer: Self-pay | Admitting: Rheumatology

## 2020-11-07 DIAGNOSIS — Z79899 Other long term (current) drug therapy: Secondary | ICD-10-CM

## 2020-11-07 DIAGNOSIS — Z5181 Encounter for therapeutic drug level monitoring: Secondary | ICD-10-CM

## 2020-11-08 ENCOUNTER — Encounter: Payer: Self-pay | Admitting: Rheumatology

## 2020-11-08 ENCOUNTER — Ambulatory Visit: Payer: Medicare PPO | Admitting: Rheumatology

## 2020-11-08 ENCOUNTER — Other Ambulatory Visit: Payer: Self-pay

## 2020-11-08 VITALS — BP 132/80 | HR 76 | Ht <= 58 in | Wt 225.0 lb

## 2020-11-08 DIAGNOSIS — Z96652 Presence of left artificial knee joint: Secondary | ICD-10-CM

## 2020-11-08 DIAGNOSIS — M19072 Primary osteoarthritis, left ankle and foot: Secondary | ICD-10-CM

## 2020-11-08 DIAGNOSIS — Z8679 Personal history of other diseases of the circulatory system: Secondary | ICD-10-CM

## 2020-11-08 DIAGNOSIS — Z8669 Personal history of other diseases of the nervous system and sense organs: Secondary | ICD-10-CM

## 2020-11-08 DIAGNOSIS — Z5181 Encounter for therapeutic drug level monitoring: Secondary | ICD-10-CM

## 2020-11-08 DIAGNOSIS — M0609 Rheumatoid arthritis without rheumatoid factor, multiple sites: Secondary | ICD-10-CM | POA: Diagnosis not present

## 2020-11-08 DIAGNOSIS — Z8673 Personal history of transient ischemic attack (TIA), and cerebral infarction without residual deficits: Secondary | ICD-10-CM

## 2020-11-08 DIAGNOSIS — C50911 Malignant neoplasm of unspecified site of right female breast: Secondary | ICD-10-CM

## 2020-11-08 DIAGNOSIS — M5136 Other intervertebral disc degeneration, lumbar region: Secondary | ICD-10-CM

## 2020-11-08 DIAGNOSIS — M1711 Unilateral primary osteoarthritis, right knee: Secondary | ICD-10-CM | POA: Diagnosis not present

## 2020-11-08 DIAGNOSIS — M19071 Primary osteoarthritis, right ankle and foot: Secondary | ICD-10-CM

## 2020-11-08 DIAGNOSIS — Z8639 Personal history of other endocrine, nutritional and metabolic disease: Secondary | ICD-10-CM

## 2020-11-08 DIAGNOSIS — Z79899 Other long term (current) drug therapy: Secondary | ICD-10-CM

## 2020-11-08 NOTE — Telephone Encounter (Signed)
Next Visit: 11/08/2020   Last Visit: 09/07/2020   UDS:04/15/2020   Narc Agreement: 04/11/2020   Last Fill: 10/10/2020 Tramadol 50 mg 1 to 2 tablets by mouth daily PRN for pain relief   Okay to refill Tramadol?

## 2020-11-08 NOTE — Patient Instructions (Signed)
Standing Labs We placed an order today for your standing lab work.   Please have your standing labs drawn in February and every 3 months  If possible, please have your labs drawn 2 weeks prior to your appointment so that the provider can discuss your results at your appointment.  Please note that you may see your imaging and lab results in [place] before we have reviewed them. We may be awaiting multiple results to interpret others before contacting you. Please allow our office up to 72 hours to thoroughly review all of the results before contacting the office for clarification of your results.  We have open lab daily: Monday through Thursday from 1:30-4:30 PM and Friday from 1:30-4:00 PM at the office of Dr. Bo Merino, [facility].   Please be advised, all patients with office appointments requiring lab work will take precedent over walk-in lab work.  If possible, please come for your lab work on Monday and Friday afternoons, as you may experience shorter wait times. The office is located at [address] [place], [place], [place] [address] No appointment is necessary.   Labs are drawn by [facility]. Please bring your co-pay at the time of your lab draw.  You may receive a bill from [facility] for your lab work.  If you wish to have your labs drawn at another location, please call the office 24 hours in advance to send orders.  If you have any questions regarding directions or hours of operation,  please call [phone].   As a reminder, please drink plenty of water prior to coming for your lab work. Thanks!   Vaccines You are taking a medication(s) that can suppress your immune system.  The following immunizations are recommended: Flu annually Covid-19  Td/Tdap (tetanus, diphtheria, pertussis) every 10 years Pneumonia (Prevnar 15 then Pneumovax 23 at least 1 year apart.  Alternatively, can take Prevnar 20 without needing additional dose) Shingrix: 2 doses from 4  weeks to 6 months apart  Please check with your PCP to make sure you are up to date.   If you have signs or symptoms of an infection or start antibiotics: First, call your PCP for workup of your infection. Hold your medication through the infection, until you complete your antibiotics, and until symptoms resolve if you take the following: Injectable medication (Actemra, Benlysta, Cimzia, Cosentyx, Enbrel, Humira, Kevzara, Orencia, Remicade, Simponi, Stelara, Taltz, Tremfya) Methotrexate Leflunomide (Arava) Mycophenolate (Cellcept) Morrie Sheldon, Olumiant, or Rinvoq  Heart Disease Prevention   Your inflammatory disease increases your risk of heart disease which includes heart attack, stroke, atrial fibrillation (irregular heartbeats), high blood pressure, heart failure and atherosclerosis (plaque in the arteries).  It is important to reduce your risk by:   Keep blood pressure, cholesterol, and blood sugar at healthy levels   Smoking Cessation   Maintain a healthy weight  BMI 20-25   Eat a healthy diet  Plenty of fresh fruit, vegetables, and whole grains  Limit saturated fats, foods high in sodium, and added sugars  DASH and Mediterranean diet   Increase physical activity  Recommend moderate physically activity for 150 minutes per week/ 30 minutes a day for five days a week These can be broken up into three separate ten-minute sessions during the day.   Reduce Stress  Meditation, slow breathing exercises, yoga, coloring books  Dental visits twice a year

## 2020-11-10 LAB — CBC WITH DIFFERENTIAL/PLATELET
Absolute Monocytes: 433 cells/uL (ref 200–950)
Basophils Absolute: 49 cells/uL (ref 0–200)
Basophils Relative: 0.8 %
Eosinophils Absolute: 140 cells/uL (ref 15–500)
Eosinophils Relative: 2.3 %
HCT: 35.2 % (ref 35.0–45.0)
Hemoglobin: 11.4 g/dL — ABNORMAL LOW (ref 11.7–15.5)
Lymphs Abs: 1373 cells/uL (ref 850–3900)
MCH: 28.1 pg (ref 27.0–33.0)
MCHC: 32.4 g/dL (ref 32.0–36.0)
MCV: 86.7 fL (ref 80.0–100.0)
MPV: 10.2 fL (ref 7.5–12.5)
Monocytes Relative: 7.1 %
Neutro Abs: 4105 cells/uL (ref 1500–7800)
Neutrophils Relative %: 67.3 %
Platelets: 265 10*3/uL (ref 140–400)
RBC: 4.06 10*6/uL (ref 3.80–5.10)
RDW: 14.4 % (ref 11.0–15.0)
Total Lymphocyte: 22.5 %
WBC: 6.1 10*3/uL (ref 3.8–10.8)

## 2020-11-10 LAB — QUANTIFERON-TB GOLD PLUS
Mitogen-NIL: >10 IU/mL
NIL: 0.03 IU/mL
QuantiFERON-TB Gold Plus: NEGATIVE
TB1-NIL: 0 IU/mL
TB2-NIL: 0.01 IU/mL

## 2020-11-10 LAB — DRUG MONITOR, PANEL 5, W/CONF, URINE
Amphetamines: NEGATIVE ng/mL (ref <500)
Barbiturates: NEGATIVE ng/mL (ref <300)
Benzodiazepines: NEGATIVE ng/mL (ref <100)
Cocaine Metabolite: NEGATIVE ng/mL (ref <150)
Creatinine: 170.1 mg/dL (ref >=20.0)
Marijuana Metabolite: NEGATIVE ng/mL (ref <20)
Methadone Metabolite: NEGATIVE ng/mL (ref <100)
Opiates: NEGATIVE ng/mL (ref <100)
Oxidant: NEGATIVE ug/mL (ref <200)
Oxycodone: NEGATIVE ng/mL (ref <100)
pH: 5.9 (ref 4.5–9.0)

## 2020-11-10 LAB — DM TEMPLATE

## 2020-11-10 LAB — COMPLETE METABOLIC PANEL WITH GFR
AG Ratio: 1.2 (calc) (ref 1.0–2.5)
ALT: 9 U/L (ref 6–29)
AST: 15 U/L (ref 10–35)
Albumin: 4.1 g/dL (ref 3.6–5.1)
Alkaline phosphatase (APISO): 108 U/L (ref 37–153)
BUN/Creatinine Ratio: 17 (calc) (ref 6–22)
BUN: 19 mg/dL (ref 7–25)
CO2: 29 mmol/L (ref 20–32)
Calcium: 9.9 mg/dL (ref 8.6–10.4)
Chloride: 102 mmol/L (ref 98–110)
Creat: 1.09 mg/dL — ABNORMAL HIGH (ref 0.60–1.00)
Globulin: 3.5 g/dL (calc) (ref 1.9–3.7)
Glucose, Bld: 77 mg/dL (ref 65–99)
Potassium: 4.6 mmol/L (ref 3.5–5.3)
Sodium: 140 mmol/L (ref 135–146)
Total Bilirubin: 0.3 mg/dL (ref 0.2–1.2)
Total Protein: 7.6 g/dL (ref 6.1–8.1)
eGFR: 53 mL/min/{1.73_m2} — ABNORMAL LOW (ref >=60)

## 2020-11-10 LAB — SEDIMENTATION RATE: Sed Rate: 55 mm/h — ABNORMAL HIGH (ref 0–30)

## 2020-11-10 LAB — DRUG MONITOR, TRAMADOL,QN, URINE
Desmethyltramadol: 6760 ng/mL — ABNORMAL HIGH (ref <100)
Tramadol: >10000 ng/mL — ABNORMAL HIGH (ref <100)

## 2020-11-10 NOTE — Progress Notes (Signed)
TB Gold is negative, sed rate is elevated and stable, creatinine is elevated, most likely due to diuretic use.  Mild anemia noted which is a stable.  Please forward results to her PCP.

## 2020-11-10 NOTE — Progress Notes (Signed)
UDS consistent with tramadol use.

## 2020-11-22 ENCOUNTER — Other Ambulatory Visit (HOSPITAL_COMMUNITY): Payer: Self-pay

## 2020-12-05 ENCOUNTER — Other Ambulatory Visit (HOSPITAL_COMMUNITY): Payer: Self-pay

## 2020-12-06 ENCOUNTER — Other Ambulatory Visit: Payer: Self-pay | Admitting: Physician Assistant

## 2020-12-06 DIAGNOSIS — Z79899 Other long term (current) drug therapy: Secondary | ICD-10-CM

## 2020-12-06 DIAGNOSIS — Z5181 Encounter for therapeutic drug level monitoring: Secondary | ICD-10-CM

## 2020-12-06 NOTE — Telephone Encounter (Signed)
Next Visit: 02/08/2021  Last Visit: 11/08/2020  UDS:11/08/2020  Narc Agreement: 11/08/2020  Last Fill: 11/08/2020 tramadol 50 mg 1 to 2 tablets by mouth daily PRN for pain relief  Okay to refill Tramadol?

## 2020-12-23 ENCOUNTER — Telehealth: Payer: Self-pay | Admitting: Pharmacy Technician

## 2020-12-23 NOTE — Telephone Encounter (Signed)
Received notification from [facility] regarding a prior authorization for [facility]. Authorization has been APPROVED from 01/09/20 to 01/07/22.   Authorization # Key: Jacob Moores - PA Case ID: [id number]

## 2020-12-27 ENCOUNTER — Other Ambulatory Visit: Payer: Self-pay | Admitting: Physician Assistant

## 2020-12-27 ENCOUNTER — Other Ambulatory Visit (HOSPITAL_COMMUNITY): Payer: Self-pay

## 2020-12-27 DIAGNOSIS — M0609 Rheumatoid arthritis without rheumatoid factor, multiple sites: Secondary | ICD-10-CM

## 2020-12-27 MED ORDER — ORENCIA CLICKJECT 125 MG/ML ~~LOC~~ SOAJ
125.0000 mg | SUBCUTANEOUS | 0 refills | Status: DC
  Filled 2020-12-27: qty 4, 28d supply, fill #0
  Filled 2021-01-26: qty 4, 28d supply, fill #1
  Filled 2021-02-21: qty 4, 28d supply, fill #2

## 2020-12-27 NOTE — Telephone Encounter (Signed)
Next Visit: 02/08/2021  Last Visit: 11/08/2020  Last Fill: 09/15/2020  [id number] arthritis of multiple sites with negative rheumatoid factor   Current Dose per office note 11/08/2020:  Orencia 125 mg 7 days injections once weekly.  Labs: 11/08/2020 sed rate is elevated and stable, creatinine is elevated, most likely due to diuretic use.  Mild anemia noted which is a stable.    TB Gold: 11/08/2020 Neg   Okay to refill Orencia?

## 2021-01-03 ENCOUNTER — Other Ambulatory Visit: Payer: Self-pay | Admitting: Rheumatology

## 2021-01-03 ENCOUNTER — Other Ambulatory Visit (HOSPITAL_COMMUNITY): Payer: Self-pay

## 2021-01-03 DIAGNOSIS — Z79899 Other long term (current) drug therapy: Secondary | ICD-10-CM

## 2021-01-03 DIAGNOSIS — Z5181 Encounter for therapeutic drug level monitoring: Secondary | ICD-10-CM

## 2021-01-04 NOTE — Telephone Encounter (Signed)
Next Visit: 02/08/2021   Last Visit: 11/08/2020   UDS:11/08/2020   Narc Agreement: 11/08/2020   Last Fill: 12/06/2020 tramadol 50 mg 1 to 2 tablets by mouth daily PRN for pain relief   Okay to refill Tramadol?

## 2021-01-26 ENCOUNTER — Other Ambulatory Visit (HOSPITAL_COMMUNITY): Payer: Self-pay

## 2021-01-27 NOTE — Progress Notes (Signed)
Office Visit Note  Patient: [name]             Date of Birth: [birth year]           MRN: [id number]             PCP: Ann Held, MD Referring: Ann Held, MD Visit Date: 02/09/2021 Occupation: @GUAROCC @  Subjective:  Pain in both wrists   History of Present Illness: [name] is a 76 y.o. female with history of seronegative rheumatoid arthritis, osteoarthritis, and fibromyalgia.  She is on orencia 125 mg sq injections once weekly.  She has not missed any doses of Orencia recently.  She continues to tolerate Orencia without any side effects or injection site reactions.  She reports that she has had some increased pain in both wrist joints and some increased stiffness in both hands.  She has ongoing discomfort and stiffness in both shoulder joints as well.  Her right middle trigger finger has been locking up at times.  She denies any joint swelling currently.  She has occasional discomfort in her right knee joint but states her left knee replacement is doing well.  She continues to use a Rollator walker to assist with ambulation.  She denies any recent falls.  She has been taking tramadol 50 mg 1 to 2 tablets daily as needed for pain relief. She denies any recent infections.   Activities of Daily Living:  Patient reports morning stiffness for 15 minutes.   Patient Reports nocturnal pain.  Difficulty dressing/grooming: Denies Difficulty climbing stairs: Reports Difficulty getting out of chair: Reports Difficulty using hands for taps, buttons, cutlery, and/or writing: Reports  Review of Systems  Constitutional:  Positive for fatigue.  HENT:  Negative for mouth sores, mouth dryness and nose dryness.   Eyes:  Negative for pain, itching and dryness.  Respiratory:  Positive for shortness of breath. Negative for difficulty breathing.   Cardiovascular:  Negative for chest pain and palpitations.  Gastrointestinal:  Negative for blood in stool, constipation and  diarrhea.  Endocrine: Negative for increased urination.  Genitourinary:  Negative for difficulty urinating.  Musculoskeletal:  Positive for joint pain, joint pain, myalgias, morning stiffness, muscle tenderness and myalgias. Negative for joint swelling.  Skin:  Negative for color change, rash and redness.  Allergic/Immunologic: Negative for susceptible to infections.  Neurological:  Positive for dizziness and numbness. Negative for headaches, memory loss and weakness.  Hematological:  Negative for bruising/bleeding tendency.  Psychiatric/Behavioral:  Negative for confusion.    PMFS History:  Patient Active Problem List   Diagnosis Date Noted   Unilateral primary osteoarthritis, right knee 11/18/2018   Closed fracture of shaft of metatarsal bone 07/30/2016   Metatarsal fracture 07/30/2016   Displaced fracture of second metatarsal bone, right foot, initial encounter for closed fracture 07/26/2016   Arthritis of shoulder 04/10/2016   High risk medication use 03/16/2016   Primary osteoarthritis of both feet 03/16/2016   Primary osteoarthritis of both knees 03/16/2016   DDD (degenerative disc disease), lumbar 03/16/2016   OSA on CPAP 07/27/2015   Lip numbness 03/19/2015   TIA (transient ischemic attack) 03/19/2015   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[place]) 02/06/2015   Post op infection 02/05/2015   History of lumbar laminectomy for spinal cord decompression 01/17/2015   Lumbar spinal stenosis 01/17/2015   Primary osteoarthritis of left knee 03/25/2014   Rheumatoid arthritis of multiple sites with negative rheumatoid factor ([place]) 03/25/2014   History of total knee replacement, left 03/25/2014  DERMATITIS 09/28/2008   CONSTIPATION 05/25/2008   DYSPNEA ON EXERTION 05/25/2008   ANEMIA, NORMOCYTIC 04/13/2008   ANKLE PAIN, BILATERAL 11/25/2007   HLD (hyperlipidemia) 08/14/2006   Morbid (severe) obesity due to excess calories ([place]) 12/13/2005   CARPAL TUNNEL SYNDROME 12/13/2005   PERIPHERAL  NEUROPATHY 12/13/2005   Essential hypertension 12/13/2005   DYSRHYTHMIA, CARDIAC NOS 12/13/2005    Past Medical History:  Diagnosis Date   Anemia    Arthritis    Rheumatoid, followed by Dr. Estanislado Pandy, knees & ankles    Cancer ([place])    CHF (congestive heart failure) ( [place])    pt not aware of this   Constipation    GERD (gastroesophageal reflux disease)    H/O cardiovascular stress test 02/2014   seen by Dr. Einar Gip - told that she was cleared for surgery   Heart murmur    states she's never had any problems   Hypertension    Leg cramps    Shortness of breath dyspnea    with exertion   Sleep apnea    CPAP in use- QO night, study done at [facility]- 2007    Stroke [facility]) 03/2015   TIA    Family History  Problem Relation Age of Onset   Seizures Mother    Transient ischemic attack Mother    Hypertension Mother    Diabetes Mother    Congestive Heart Failure Mother    Arthritis/Rheumatoid Mother    Parkinson's disease Mother    Dementia Mother    Heart disease Father        CHF   Stroke Sister    Depression Sister    Arthritis Brother    Arthritis Brother    Arthritis Son    Hypertension Son    Past Surgical History:  Procedure Laterality Date   ABDOMINAL HYSTERECTOMY     CARPAL TUNNEL RELEASE Right    COLONOSCOPY     COLONOSCOPY WITH PROPOFOL N/A 11/13/2019   Procedure: COLONOSCOPY WITH PROPOFOL;  Surgeon: Carol Ada, MD;  Location: [facility];  Service: Endoscopy;  Laterality: N/A;   ESOPHAGOGASTRODUODENOSCOPY (EGD) WITH PROPOFOL N/A 11/13/2019   Procedure: ESOPHAGOGASTRODUODENOSCOPY (EGD) WITH PROPOFOL;  Surgeon: Carol Ada, MD;  Location: [facility];  Service: Endoscopy;  Laterality: N/A;   FOOT SURGERY Bilateral    bunionectomy, calluses , also has several pins & screws   HAND SURGERY     JOINT REPLACEMENT     planned for 03/25/2014   KNEE ARTHROPLASTY     KNEE SURGERY  1990's    LUMBAR LAMINECTOMY/DECOMPRESSION MICRODISCECTOMY N/A 01/17/2015   Procedure:  L2-3, L3-4 Decompression;  Surgeon: [name], MD;  Location: [place];  Service: Orthopedics;  Laterality: N/A;   LUMBAR LAMINECTOMY/DECOMPRESSION MICRODISCECTOMY N/A 02/06/2015   Procedure: I AND D LUMBAR WITH WOUND VAC PLACEMENT;  Surgeon: [name], MD;  Location: [place];  Service: Orthopedics;  Laterality: N/A;   MASTECTOMY Right 02/01/2020   ORIF TOE FRACTURE Right 07/30/2016   Procedure: OPEN REDUCTION INTERNAL FIXATION (ORIF) RIGHT 2ND METATARSAL (TOE) FRACTURE;  Surgeon: [name], MD;  Location: [place];  Service: Orthopedics;  Laterality: Right;   POLYPECTOMY  11/13/2019   Procedure: POLYPECTOMY;  Surgeon: Carol Ada, MD;  Location: [facility];  Service: Endoscopy;;   TOTAL KNEE ARTHROPLASTY Left 03/25/2014   Procedure: TOTAL KNEE ARTHROPLASTY;  Surgeon: Garald Balding, MD;  Location: [place];  Service: Orthopedics;  Laterality: Left;   TUBAL LIGATION     Social History   Social  History Narrative   Lives with husband.  2 Sons.  Using walker.    Caffeine  Less then 1 cups daily.   Retired.     Immunization History  Administered Date(s) Administered   H1N1 12/23/2007   Influenza Split 11/03/2014   Influenza Whole 12/19/2005, 11/13/2006   Influenza,inj,Quad PF,6+ Mos 09/16/2017   Influenza-Unspecified 11/03/2014   PFIZER Comirnaty(Gray Top)Covid-19 Tri-Sucrose Vaccine 01/28/2019, 02/18/2019, 10/07/2019   PFIZER(Purple Top)SARS-COV-2 Vaccination 01/28/2019, 02/18/2019   Pneumococcal Polysaccharide-23 12/19/2005   Tdap 12/20/2011     Objective: Vital Signs: BP (!) 145/84 (BP Location: Left Arm, Patient Position: Sitting, Cuff Size: Large)    Pulse 73    Ht 4\' 10"  (1.473 m)    Wt 225 lb (102.1 kg)    BMI 47.03 kg/m    Physical Exam Vitals and nursing note reviewed.  Constitutional:      Appearance: She is well-developed.  HENT:     Head: Normocephalic and atraumatic.  Eyes:     Conjunctiva/sclera: Conjunctivae normal.  Cardiovascular:     Rate and Rhythm:  Normal rate and regular rhythm.     Heart sounds: Normal heart sounds.  Pulmonary:     Effort: Pulmonary effort is normal.     Breath sounds: Normal breath sounds.  Abdominal:     General: Bowel sounds are normal.     Palpations: Abdomen is soft.  Musculoskeletal:     Cervical back: Normal range of motion.  Lymphadenopathy:     Cervical: No cervical adenopathy.  Skin:    General: Skin is warm and dry.     Capillary Refill: Capillary refill takes less than 2 seconds.  Neurological:     Mental Status: She is alert and oriented to person, place, and time.  Psychiatric:        Behavior: Behavior normal.     Musculoskeletal Exam: C-spine has good range of motion with no discomfort.  Limited abduction of both shoulders to about 110 degrees bilaterally.  Limited internal rotation with discomfort in both shoulders.  Elbow joints have good range of motion with no tenderness or inflammation.  Synovial thickening and limited range of motion of both wrist joints.  She has tenderness over the ulnar aspect of both wrists.  Synovial thickening of MCP, PIP, and DIP joints.  Right middle trigger finger noted.  Hip joints difficult to assess in seated position.  Left knee replacement has good range of motion with no warmth or effusion.  Right knee joint has good range of motion with no warmth or effusion.  Subluxation of both ankle joints but no tenderness or inflammation was noted.  CDAI Exam: CDAI Score: 5.8  Patient Global: 4 mm; Provider Global: 4 mm Swollen: 0 ; Tender: 5  Joint Exam 02/09/2021      Right  Left  Glenohumeral   Tender   Tender  Wrist   Tender   Tender  MCP 3   Tender        Investigation: No additional findings.  Imaging: No results found.  Recent Labs: Lab Results  Component Value Date   WBC 6.1 11/08/2020   HGB 11.4 (L) 11/08/2020   PLT 265 11/08/2020   NA 140 11/08/2020   K 4.6 11/08/2020   CL 102 11/08/2020   CO2 29 11/08/2020   GLUCOSE 77 11/08/2020   BUN  19 11/08/2020   CREATININE 1.09 (H) 11/08/2020   BILITOT 0.3 11/08/2020   ALKPHOS 129 (H) 11/16/2019   AST 15 11/08/2020   ALT  9 11/08/2020   PROT 7.6 11/08/2020   ALBUMIN 3.8 11/16/2019   CALCIUM 9.9 11/08/2020   GFRAA 62 11/16/2019   QFTBGOLDPLUS NEGATIVE 11/08/2020    Speciality Comments: Narcotic Agreement 10/30/16 Humira stopped 01/18/20 d/t chemo/radiation Orencia started 09/15/20  Procedures:  No procedures performed Allergies: Ramipril   Assessment / Plan:     Visit Diagnoses: Rheumatoid arthritis of multiple sites with negative rheumatoid factor ([place]): She has no synovitis on examination today.  She has ongoing pain in both wrist joints and stiffness in both hands.  She has tenderness over the ulnar aspect of both wrist but no synovitis was noted.  She continues to experience intermittent discomfort and stiffness in both shoulders and the right knee joint.  Overall she has clinically been doing well on Orencia 125 mg subcutaneous injections once weekly.  She has been tolerating Orencia without any side effects or injection site reactions.  She has not missed any doses of Orencia recently.  She has not had any recent infections. Discussed the use of arthritis compression gloves.  I also discussed the importance of joint protection and muscle strengthening.  She will remain on Orencia as monotherapy.  She was advised to notify us if she develops increased joint pain or joint swelling.  She will follow-up in the office in 3 months.  High risk medication use - Orencia 125 mg sq injections every 7 days.   Previously held Humira prior to starting Chemotherapy and radiation therapy.  CBC and CMP updated on 12/28/20.  She will be due to update lab work in March and every 3 months to monitor for drug toxicity.  TB Gold negative on 11/08/2020. She has not had any recent infections.  Discussed the importance of holding Orencia if she develops signs or symptoms of an infection and to resume once  the infection is completely cleared.  Trigger finger, right middle finger: She has been experiencing intermittent locking and tenderness of the right middle finger.  Discussed that her symptoms are consistent with a trigger finger.  She has been using a stress ball to try to improve her strength and range of motion.  She was encouraged to try to rest the tendon by either using buddy tape or a splint.  She can also use topical agents as needed to alleviate her discomfort.  She was advised to notify us if her symptoms persist or worsen at which time she can return to the office for an ultrasound-guided right middle trigger finger injection.  History of total knee replacement, left: Doing well.  She has good range of motion of the left knee joint with no discomfort.  No warmth or effusion was noted.  She continues to use a Rollator walker to assist with ambulation.  Unilateral primary osteoarthritis, right knee: She experiences intermittent discomfort and soreness in the right knee joint.  On exam she has good range of motion of the right knee with no warmth or effusion.  Primary osteoarthritis of both feet: She has subluxation of both ankle joints with synovial thickening.  No tenderness or inflammation of her ankle joints were noted on exam.  DDD (degenerative disc disease), lumbar: She continues to have chronic discomfort in her lower back.  No symptoms of radiculopathy at this time.  She has difficulty walking or standing for long periods of time due to discomfort in her lower back.  She has been using a Rollator walker to assist with ambulation.  No recent falls.  Other medical conditions are listed  as follows:   Invasive ductal carcinoma of right breast (HCC)  History of TIA (transient ischemic attack)  History of hypertension  History of hyperlipidemia  History of sleep apnea  Orders: No orders of the defined types were placed in this encounter.  No orders of the defined types were  placed in this encounter.    Follow-Up Instructions: Return in about 3 months (around 05/09/2021) for Rheumatoid arthritis, Osteoarthritis, Fibromyalgia.   Ofilia Neas, PA-C  Note - This record has been created using Dragon software.  Chart creation errors have been sought, but may not always  have been located. Such creation errors do not reflect on  the standard of medical care.

## 2021-01-31 ENCOUNTER — Other Ambulatory Visit (HOSPITAL_COMMUNITY): Payer: Self-pay

## 2021-02-07 ENCOUNTER — Other Ambulatory Visit: Payer: Self-pay | Admitting: Physician Assistant

## 2021-02-07 DIAGNOSIS — Z5181 Encounter for therapeutic drug level monitoring: Secondary | ICD-10-CM

## 2021-02-07 DIAGNOSIS — Z79899 Other long term (current) drug therapy: Secondary | ICD-10-CM

## 2021-02-07 NOTE — Telephone Encounter (Signed)
Next Visit: 02/09/2021   Last Visit: 11/08/2020   UDS:11/08/2020   Narc Agreement: 11/08/2020   Last Fill: 01/04/2021 tramadol 50 mg 1 to 2 tablets by mouth daily PRN for pain relief   Okay to refill Tramadol?

## 2021-02-08 ENCOUNTER — Ambulatory Visit: Payer: Medicare PPO | Admitting: Physician Assistant

## 2021-02-09 ENCOUNTER — Ambulatory Visit: Payer: Medicare PPO | Admitting: Physician Assistant

## 2021-02-09 ENCOUNTER — Encounter: Payer: Self-pay | Admitting: Physician Assistant

## 2021-02-09 ENCOUNTER — Other Ambulatory Visit: Payer: Self-pay

## 2021-02-09 VITALS — BP 145/84 | HR 73 | Ht <= 58 in | Wt 225.0 lb

## 2021-02-09 DIAGNOSIS — Z96652 Presence of left artificial knee joint: Secondary | ICD-10-CM

## 2021-02-09 DIAGNOSIS — Z8669 Personal history of other diseases of the nervous system and sense organs: Secondary | ICD-10-CM

## 2021-02-09 DIAGNOSIS — Z8673 Personal history of transient ischemic attack (TIA), and cerebral infarction without residual deficits: Secondary | ICD-10-CM

## 2021-02-09 DIAGNOSIS — M1711 Unilateral primary osteoarthritis, right knee: Secondary | ICD-10-CM

## 2021-02-09 DIAGNOSIS — M5136 Other intervertebral disc degeneration, lumbar region: Secondary | ICD-10-CM

## 2021-02-09 DIAGNOSIS — M65331 Trigger finger, right middle finger: Secondary | ICD-10-CM

## 2021-02-09 DIAGNOSIS — M19071 Primary osteoarthritis, right ankle and foot: Secondary | ICD-10-CM

## 2021-02-09 DIAGNOSIS — M0609 Rheumatoid arthritis without rheumatoid factor, multiple sites: Secondary | ICD-10-CM

## 2021-02-09 DIAGNOSIS — Z8639 Personal history of other endocrine, nutritional and metabolic disease: Secondary | ICD-10-CM

## 2021-02-09 DIAGNOSIS — Z79899 Other long term (current) drug therapy: Secondary | ICD-10-CM

## 2021-02-09 DIAGNOSIS — M19072 Primary osteoarthritis, left ankle and foot: Secondary | ICD-10-CM

## 2021-02-09 DIAGNOSIS — Z8679 Personal history of other diseases of the circulatory system: Secondary | ICD-10-CM

## 2021-02-09 DIAGNOSIS — C50911 Malignant neoplasm of unspecified site of right female breast: Secondary | ICD-10-CM

## 2021-02-09 NOTE — Patient Instructions (Signed)
Standing Labs We placed an order today for your standing lab work.   Please have your standing labs drawn in March and every 3 months   If possible, please have your labs drawn 2 weeks prior to your appointment so that the provider can discuss your results at your appointment.  Please note that you may see your imaging and lab results in [place] before we have reviewed them. We may be awaiting multiple results to interpret others before contacting you. Please allow our office up to 72 hours to thoroughly review all of the results before contacting the office for clarification of your results.  We have open lab daily: Monday through Thursday from 1:30-4:30 PM and Friday from 1:30-4:00 PM at the office of Dr. Bo Merino, [facility].   Please be advised, all patients with office appointments requiring lab work will take precedent over walk-in lab work.  If possible, please come for your lab work on Monday and Friday afternoons, as you may experience shorter wait times. The office is located at [address] [place], [place], [place] [address] No appointment is necessary.   Labs are drawn by [facility]. Please bring your co-pay at the time of your lab draw.  You may receive a bill from [facility] for your lab work.  Please note if you are on Hydroxychloroquine and and an order has been placed for a Hydroxychloroquine level, you will need to have it drawn 4 hours or more after your last dose.  If you wish to have your labs drawn at another location, please call the office 24 hours in advance to send orders.  If you have any questions regarding directions or hours of operation,  please call [phone].   As a reminder, please drink plenty of water prior to coming for your lab work. Thanks!

## 2021-02-21 ENCOUNTER — Other Ambulatory Visit (HOSPITAL_COMMUNITY): Payer: Self-pay

## 2021-02-28 ENCOUNTER — Other Ambulatory Visit (HOSPITAL_COMMUNITY): Payer: Self-pay

## 2021-03-09 ENCOUNTER — Other Ambulatory Visit: Payer: Self-pay | Admitting: Rheumatology

## 2021-03-09 DIAGNOSIS — Z79899 Other long term (current) drug therapy: Secondary | ICD-10-CM

## 2021-03-09 DIAGNOSIS — Z5181 Encounter for therapeutic drug level monitoring: Secondary | ICD-10-CM

## 2021-03-09 NOTE — Telephone Encounter (Signed)
Next Visit: 05/09/2021 ? ?Last Visit: 02/09/2021 ? ?UDS:11/08/2020 UDS consistent with tramadol use ? ?Narc Agreement: 01/09/2020 ? ?Last Fill: 02/07/2021 ? ?Okay to refill Tramadol?  ?

## 2021-03-21 ENCOUNTER — Other Ambulatory Visit (HOSPITAL_COMMUNITY): Payer: Self-pay

## 2021-03-21 ENCOUNTER — Other Ambulatory Visit: Payer: Self-pay | Admitting: Physician Assistant

## 2021-03-21 DIAGNOSIS — M0609 Rheumatoid arthritis without rheumatoid factor, multiple sites: Secondary | ICD-10-CM

## 2021-03-21 DIAGNOSIS — Z79899 Other long term (current) drug therapy: Secondary | ICD-10-CM

## 2021-03-21 MED ORDER — ORENCIA CLICKJECT 125 MG/ML ~~LOC~~ SOAJ
125.0000 mg | SUBCUTANEOUS | 0 refills | Status: DC
  Filled 2021-03-22: qty 4, 28d supply, fill #0
  Filled 2021-04-19: qty 4, 28d supply, fill #1
  Filled 2021-05-15: qty 4, 28d supply, fill #2

## 2021-03-21 NOTE — Telephone Encounter (Signed)
Next Visit: 05/09/2021 ?  ?Last Visit: 02/09/2021 ? ?Last Fill: 12/27/2020 ? ?DX: Rheumatoid arthritis of multiple sites with negative rheumatoid factor  ? ?Current Dose per office note 02/09/2021: Orencia 125 mg sq injections every 7 days ? ?Labs: 12/27/2020 RBC 3.70, Hgb 10.4, Hct 32.0, MCHC 32.5, GFR 51 ? ?TB Gold: 11/08/2020 Neg   ? ?Patient advised she is due to update labs this month. Patient will update tomorrow.  ? ?Okay to refill Orencia?  ?

## 2021-03-22 ENCOUNTER — Other Ambulatory Visit (HOSPITAL_COMMUNITY): Payer: Self-pay

## 2021-03-22 ENCOUNTER — Other Ambulatory Visit: Payer: Self-pay | Admitting: *Deleted

## 2021-03-22 DIAGNOSIS — Z79899 Other long term (current) drug therapy: Secondary | ICD-10-CM

## 2021-03-24 LAB — CBC WITH DIFFERENTIAL/PLATELET
Basophils Absolute: 0.1 10*3/uL (ref 0.0–0.2)
Basos: 1 %
EOS (ABSOLUTE): 0.2 10*3/uL (ref 0.0–0.4)
Eos: 3 %
Hematocrit: 33.5 % — ABNORMAL LOW (ref 34.0–46.6)
Hemoglobin: 10.8 g/dL — ABNORMAL LOW (ref 11.1–15.9)
Immature Grans (Abs): 0 10*3/uL (ref 0.0–0.1)
Immature Granulocytes: 0 %
Lymphocytes Absolute: 1.5 10*3/uL (ref 0.7–3.1)
Lymphs: 22 %
MCH: 28.2 pg (ref 26.6–33.0)
MCHC: 32.2 g/dL (ref 31.5–35.7)
MCV: 88 fL (ref 79–97)
Monocytes Absolute: 0.4 10*3/uL (ref 0.1–0.9)
Monocytes: 6 %
Neutrophils Absolute: 4.5 10*3/uL (ref 1.4–7.0)
Neutrophils: 68 %
Platelets: 257 10*3/uL (ref 150–450)
RBC: 3.83 x10E6/uL (ref 3.77–5.28)
RDW: 13.3 % (ref 11.7–15.4)
WBC: 6.7 10*3/uL (ref 3.4–10.8)

## 2021-03-24 LAB — CMP14+EGFR
ALT: 10 IU/L (ref 0–32)
AST: 13 IU/L (ref 0–40)
Albumin/Globulin Ratio: 1.3 (ref 1.2–2.2)
Albumin: 4 g/dL (ref 3.7–4.7)
Alkaline Phosphatase: 130 IU/L — ABNORMAL HIGH (ref 44–121)
BUN/Creatinine Ratio: 16 (ref 12–28)
BUN: 18 mg/dL (ref 8–27)
Bilirubin Total: <0.2 mg/dL (ref 0.0–1.2)
CO2: 24 mmol/L (ref 20–29)
Calcium: 9.6 mg/dL (ref 8.7–10.3)
Chloride: 106 mmol/L (ref 96–106)
Creatinine, Ser: 1.16 mg/dL — ABNORMAL HIGH (ref 0.57–1.00)
Globulin, Total: 3 g/dL (ref 1.5–4.5)
Glucose: 88 mg/dL (ref 70–99)
Potassium: 4.6 mmol/L (ref 3.5–5.2)
Sodium: 146 mmol/L — ABNORMAL HIGH (ref 134–144)
Total Protein: 7 g/dL (ref 6.0–8.5)
eGFR: 49 mL/min/{1.73_m2} — ABNORMAL LOW (ref >59)

## 2021-03-24 NOTE — Progress Notes (Signed)
Creatinine is elevated, most likely due to diuretic use.  Alkaline phosphatase mildly elevated and hemoglobin is lower.  Please forward results to her PCP.

## 2021-03-29 ENCOUNTER — Other Ambulatory Visit (HOSPITAL_COMMUNITY): Payer: Self-pay

## 2021-04-05 ENCOUNTER — Other Ambulatory Visit: Payer: Self-pay | Admitting: Rheumatology

## 2021-04-05 DIAGNOSIS — Z5181 Encounter for therapeutic drug level monitoring: Secondary | ICD-10-CM

## 2021-04-05 DIAGNOSIS — Z79899 Other long term (current) drug therapy: Secondary | ICD-10-CM

## 2021-04-05 NOTE — Telephone Encounter (Signed)
Next Visit: 05/09/2021 ?  ?Last Visit: 02/09/2021 ?  ?UDS:11/08/2020 UDS consistent with tramadol use ?  ?Narc Agreement: 01/09/2020 ?  ?Last Fill: 03/09/2021 ?  ?Okay to refill Tramadol?  ?

## 2021-04-18 ENCOUNTER — Other Ambulatory Visit (HOSPITAL_COMMUNITY): Payer: Self-pay

## 2021-04-19 ENCOUNTER — Other Ambulatory Visit (HOSPITAL_COMMUNITY): Payer: Self-pay

## 2021-04-25 NOTE — Progress Notes (Signed)
? ?Office Visit Note ? ?Patient: [name]             ?Date of Birth: 1945-12-21           ?MRN: [id number]             ?PCP: Glennie Hawk, MD ?Referring: Ann Held, MD ?Visit Date: 05/09/2021 ?Occupation: '@GUAROCC'$ @ ? ?Subjective:  ?Stiffness in both wrists  ? ?History of Present Illness: [name] is a 76 y.o. female with history of seronegative rheumatoid arthritis.  She is on orencia 125 mg sq injections every week.  She is tolerating Orencia without any side effects or injection site reactions.  She has not missed any doses of Orencia recently.  Patient reports that the pain and stiffness in her hands has improved.  She has been using arthritis compression gloves.  She continues to notice intermittent stiffness, tenderness, and occasional swelling in her wrist.  She has occasional pain in her right knee joint and is planning on proceeding with a knee replacement in the future.  She states her left knee replacement is doing well.  She continues to use a rollator walker to assist with ambulation.  She denies any recent falls. ?She denies any recent infections. ? ? ?Activities of Daily Living:  ?Patient reports morning stiffness for 10 minutes.   ?Patient Reports nocturnal pain.  ?Difficulty dressing/grooming: Denies ?Difficulty climbing stairs: Reports ?Difficulty getting out of chair: Reports ?Difficulty using hands for taps, buttons, cutlery, and/or writing: Reports ? ?Review of Systems  ?Constitutional:  Positive for fatigue.  ?HENT:  Positive for mouth dryness. Negative for mouth sores and nose dryness.   ?Eyes:  Negative for pain, itching and dryness.  ?Respiratory:  Positive for shortness of breath. Negative for difficulty breathing.   ?Cardiovascular:  Negative for chest pain and palpitations.  ?Gastrointestinal:  Positive for constipation. Negative for blood in stool and diarrhea.  ?Endocrine: Negative for increased urination.  ?Genitourinary:  Positive for involuntary  urination. Negative for difficulty urinating.  ?Musculoskeletal:  Positive for joint pain, joint pain, myalgias, morning stiffness and myalgias. Negative for joint swelling and muscle tenderness.  ?Skin:  Negative for color change, rash and redness.  ?Allergic/Immunologic: Negative for susceptible to infections.  ?Neurological:  Positive for numbness and headaches. Negative for dizziness, memory loss and weakness.  ?Hematological:  Negative for bruising/bleeding tendency.  ?Psychiatric/Behavioral:  Negative for confusion.   ? ?PMFS History:  ?Patient Active Problem List  ? Diagnosis Date Noted  ? Unilateral primary osteoarthritis, right knee 11/18/2018  ? Closed fracture of shaft of metatarsal bone 07/30/2016  ? Metatarsal fracture 07/30/2016  ? Displaced fracture of second metatarsal bone, right foot, initial encounter for closed fracture 07/26/2016  ? Arthritis of shoulder 04/10/2016  ? High risk medication use 03/16/2016  ? Primary osteoarthritis of both feet 03/16/2016  ? Primary osteoarthritis of both knees 03/16/2016  ? DDD (degenerative disc disease), lumbar 03/16/2016  ? OSA on CPAP 07/27/2015  ? Lip numbness 03/19/2015  ? TIA (transient ischemic attack) 03/19/2015  ? 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[place]) 02/06/2015  ? Post op infection 02/05/2015  ? History of lumbar laminectomy for spinal cord decompression 01/17/2015  ? Lumbar spinal stenosis 01/17/2015  ? Primary osteoarthritis of left knee 03/25/2014  ? Rheumatoid arthritis of multiple sites with negative rheumatoid factor ([place]) 03/25/2014  ? History of total knee replacement, left 03/25/2014  ? DERMATITIS 09/28/2008  ? CONSTIPATION 05/25/2008  ? DYSPNEA ON EXERTION 05/25/2008  ? ANEMIA, NORMOCYTIC  04/13/2008  ? ANKLE PAIN, BILATERAL 11/25/2007  ? HLD (hyperlipidemia) 08/14/2006  ? Morbid (severe) obesity due to excess calories ([place]) 12/13/2005  ? CARPAL TUNNEL SYNDROME 12/13/2005  ? PERIPHERAL NEUROPATHY 12/13/2005  ? Essential hypertension 12/13/2005  ?  DYSRHYTHMIA, CARDIAC NOS 12/13/2005  ?  ?Past Medical History:  ?Diagnosis Date  ? Anemia   ? Arthritis   ? Rheumatoid, followed by Dr. Estanislado Pandy, knees & ankles   ? Cancer [facility])   ? CHF (congestive heart failure) ([place])   ? pt not aware of this  ? Constipation   ? GERD (gastroesophageal reflux disease)   ? H/O cardiovascular stress test 02/2014  ? seen by Dr. Einar Gip - told that she was cleared for surgery  ? Heart murmur   ? states she's never had any problems  ? Hypertension   ? Leg cramps   ? Shortness of breath dyspnea   ? with exertion  ? Sleep apnea   ? CPAP in use- QO night, study done at [facility]- 2007   ? Stroke [facility]) 03/2015  ? TIA  ?  ?Family History  ?Problem Relation Age of Onset  ? Seizures Mother   ? Transient ischemic attack Mother   ? Hypertension Mother   ? Diabetes Mother   ? Congestive Heart Failure Mother   ? Arthritis/Rheumatoid Mother   ? Parkinson's disease Mother   ? Dementia Mother   ? Heart disease Father   ?     CHF  ? Stroke Sister   ? Depression Sister   ? Arthritis Brother   ? Arthritis Brother   ? Arthritis Son   ? Hypertension Son   ? ?Past Surgical History:  ?Procedure Laterality Date  ? ABDOMINAL HYSTERECTOMY    ? CARPAL TUNNEL RELEASE Right   ? COLONOSCOPY    ? COLONOSCOPY WITH PROPOFOL N/A 11/13/2019  ? Procedure: COLONOSCOPY WITH PROPOFOL;  Surgeon: Carol Ada, MD;  Location: [facility];  Service: Endoscopy;  Laterality: N/A;  ? ESOPHAGOGASTRODUODENOSCOPY (EGD) WITH PROPOFOL N/A 11/13/2019  ? Procedure: ESOPHAGOGASTRODUODENOSCOPY (EGD) WITH PROPOFOL;  Surgeon: Carol Ada, MD;  Location: [facility];  Service: Endoscopy;  Laterality: N/A;  ? FOOT SURGERY Bilateral   ? bunionectomy, calluses , also has several pins & screws  ? HAND SURGERY    ? JOINT REPLACEMENT    ? planned for 03/25/2014  ? KNEE ARTHROPLASTY    ? KNEE SURGERY  1990's   ? LUMBAR LAMINECTOMY/DECOMPRESSION MICRODISCECTOMY N/A 01/17/2015  ? Procedure: L2-3, L3-4 Decompression;  Surgeon: [name], MD;  Location:  [place];  Service: Orthopedics;  Laterality: N/A;  ? LUMBAR LAMINECTOMY/DECOMPRESSION MICRODISCECTOMY N/A 02/06/2015  ? Procedure: I AND D LUMBAR WITH WOUND VAC PLACEMENT;  Surgeon: [name], MD;  Location: [place];  Service: Orthopedics;  Laterality: N/A;  ? MASTECTOMY Right 02/01/2020  ? ORIF TOE FRACTURE Right 07/30/2016  ? Procedure: OPEN REDUCTION INTERNAL FIXATION (ORIF) RIGHT 2ND METATARSAL (TOE) FRACTURE;  Surgeon: [name], MD;  Location: [place];  Service: Orthopedics;  Laterality: Right;  ? POLYPECTOMY  11/13/2019  ? Procedure: POLYPECTOMY;  Surgeon: Carol Ada, MD;  Location: [facility];  Service: Endoscopy;;  ? TOTAL KNEE ARTHROPLASTY Left 03/25/2014  ? Procedure: TOTAL KNEE ARTHROPLASTY;  Surgeon: Garald Balding, MD;  Location: [place];  Service: Orthopedics;  Laterality: Left;  ? TUBAL LIGATION    ? ?Social History  ? ?Social History Narrative  ? Lives with husband.  2 Sons.  Using walker.  Caffeine  Less then 1 cups daily.   Retired.    ? ?Immunization History  ?Administered Date(s) Administered  ? H1N1 12/23/2007  ? Influenza Split 11/03/2014  ? Influenza Whole 12/19/2005, 11/13/2006  ? Influenza,inj,Quad PF,6+ Mos 09/16/2017  ? Influenza-Unspecified 11/03/2014  ? PFIZER Comirnaty(Gray Top)Covid-19 Tri-Sucrose Vaccine 01/28/2019, 02/18/2019, 10/07/2019  ? PFIZER(Purple Top)SARS-COV-2 Vaccination 01/28/2019, 02/18/2019  ? Pneumococcal Polysaccharide-23 12/19/2005  ? Tdap 12/20/2011  ?  ? ?Objective: ?Vital Signs: BP (!) 153/87 (BP Location: Left Arm, Patient Position: Sitting, Cuff Size: Large)   Pulse 92   Ht '4\' 10"'$  (1.473 m)   Wt 227 lb 12.8 oz (103.3 kg)   BMI 47.61 kg/m?   ? ?Physical Exam ?Vitals and nursing note reviewed.  ?Constitutional:   ?   Appearance: She is well-developed.  ?HENT:  ?   Head: Normocephalic and atraumatic.  ?Eyes:  ?   Conjunctiva/sclera: Conjunctivae normal.  ?Cardiovascular:  ?   Rate and Rhythm: Normal rate and regular rhythm.  ?   Heart sounds: Normal  heart sounds.  ?Pulmonary:  ?   Effort: Pulmonary effort is normal.  ?   Breath sounds: Normal breath sounds.  ?Abdominal:  ?   General: Bowel sounds are normal.  ?   Palpations: Abdomen is soft.  ?Musculoskeletal:

## 2021-04-26 ENCOUNTER — Other Ambulatory Visit (HOSPITAL_COMMUNITY): Payer: Self-pay

## 2021-05-09 ENCOUNTER — Ambulatory Visit: Payer: Medicare PPO | Admitting: Physician Assistant

## 2021-05-09 ENCOUNTER — Encounter: Payer: Self-pay | Admitting: Physician Assistant

## 2021-05-09 VITALS — BP 153/87 | HR 92 | Ht <= 58 in | Wt 227.8 lb

## 2021-05-09 DIAGNOSIS — Z5181 Encounter for therapeutic drug level monitoring: Secondary | ICD-10-CM | POA: Diagnosis not present

## 2021-05-09 DIAGNOSIS — C50911 Malignant neoplasm of unspecified site of right female breast: Secondary | ICD-10-CM

## 2021-05-09 DIAGNOSIS — Z79899 Other long term (current) drug therapy: Secondary | ICD-10-CM

## 2021-05-09 DIAGNOSIS — Z8639 Personal history of other endocrine, nutritional and metabolic disease: Secondary | ICD-10-CM

## 2021-05-09 DIAGNOSIS — Z96652 Presence of left artificial knee joint: Secondary | ICD-10-CM

## 2021-05-09 DIAGNOSIS — M65331 Trigger finger, right middle finger: Secondary | ICD-10-CM | POA: Diagnosis not present

## 2021-05-09 DIAGNOSIS — M1711 Unilateral primary osteoarthritis, right knee: Secondary | ICD-10-CM

## 2021-05-09 DIAGNOSIS — Z8679 Personal history of other diseases of the circulatory system: Secondary | ICD-10-CM

## 2021-05-09 DIAGNOSIS — M0609 Rheumatoid arthritis without rheumatoid factor, multiple sites: Secondary | ICD-10-CM | POA: Diagnosis not present

## 2021-05-09 DIAGNOSIS — M19071 Primary osteoarthritis, right ankle and foot: Secondary | ICD-10-CM

## 2021-05-09 DIAGNOSIS — Z8673 Personal history of transient ischemic attack (TIA), and cerebral infarction without residual deficits: Secondary | ICD-10-CM

## 2021-05-09 DIAGNOSIS — M19072 Primary osteoarthritis, left ankle and foot: Secondary | ICD-10-CM

## 2021-05-09 DIAGNOSIS — M5136 Other intervertebral disc degeneration, lumbar region: Secondary | ICD-10-CM

## 2021-05-09 DIAGNOSIS — Z8669 Personal history of other diseases of the nervous system and sense organs: Secondary | ICD-10-CM

## 2021-05-09 MED ORDER — TRAMADOL HCL 50 MG PO TABS: ORAL_TABLET | ORAL | 0 refills | Status: DC

## 2021-05-09 NOTE — Patient Instructions (Signed)
Standing Labs ?We placed an order today for your standing lab work.  ? ?Please have your standing labs drawn in July and every 3 months  ? ?If possible, please have your labs drawn 2 weeks prior to your appointment so that the provider can discuss your results at your appointment. ? ?Please note that you may see your imaging and lab results in MyChart before we have reviewed them. ?We may be awaiting multiple results to interpret others before contacting you. ?Please allow our office up to 72 hours to thoroughly review all of the results before contacting the office for clarification of your results. ? ?We have open lab daily: ?Monday through Thursday from 1:30-4:30 PM and Friday from 1:30-4:00 PM ?at the office of Dr. Shaili Deveshwar, [facility].   ?Please be advised, all patients with office appointments requiring lab work will take precedent over walk-in lab work.  ?If possible, please come for your lab work on Monday and Friday afternoons, as you may experience shorter wait times. ?The office is located at [address], [address], [place], [place] [address] ?No appointment is necessary.   ?Labs are drawn by [facility]. Please bring your co-pay at the time of your lab draw.  You may receive a bill from [facility] for your lab work. ? ?Please note if you are on Hydroxychloroquine and and an order has been placed for a Hydroxychloroquine level, you will need to have it drawn 4 hours or more after your last dose. ? ?If you wish to have your labs drawn at another location, please call the office 24 hours in advance to send orders. ? ?If you have any questions regarding directions or hours of operation,  ?please call [phone].   ?As a reminder, please drink plenty of water prior to coming for your lab work. Thanks! ? ?

## 2021-05-11 LAB — DRUG MONITOR, PANEL 5, W/CONF, URINE
Amphetamines: NEGATIVE ng/mL (ref <500)
Barbiturates: NEGATIVE ng/mL (ref <300)
Benzodiazepines: NEGATIVE ng/mL (ref <100)
Cocaine Metabolite: NEGATIVE ng/mL (ref <150)
Creatinine: 109.8 mg/dL (ref >=20.0)
Marijuana Metabolite: NEGATIVE ng/mL (ref <20)
Methadone Metabolite: NEGATIVE ng/mL (ref <100)
Opiates: NEGATIVE ng/mL (ref <100)
Oxidant: NEGATIVE ug/mL (ref <200)
Oxycodone: NEGATIVE ng/mL (ref <100)
pH: 5.6 (ref 4.5–9.0)

## 2021-05-11 LAB — DRUG MONITOR, TRAMADOL,QN, URINE
Desmethyltramadol: 3111 ng/mL — ABNORMAL HIGH (ref <100)
Tramadol: >10000 ng/mL — ABNORMAL HIGH (ref <100)

## 2021-05-11 LAB — DM TEMPLATE

## 2021-05-11 NOTE — Progress Notes (Signed)
UDS is consistent with treatment.

## 2021-05-15 ENCOUNTER — Other Ambulatory Visit (HOSPITAL_COMMUNITY): Payer: Self-pay

## 2021-05-23 ENCOUNTER — Other Ambulatory Visit (HOSPITAL_COMMUNITY): Payer: Self-pay

## 2021-05-31 ENCOUNTER — Other Ambulatory Visit: Payer: Self-pay | Admitting: Physician Assistant

## 2021-05-31 DIAGNOSIS — Z5181 Encounter for therapeutic drug level monitoring: Secondary | ICD-10-CM

## 2021-05-31 DIAGNOSIS — Z79899 Other long term (current) drug therapy: Secondary | ICD-10-CM

## 2021-05-31 NOTE — Telephone Encounter (Signed)
Next Visit: 10/27/2021  Last Visit: 05/09/2021  UDS:05/09/2021   Narc Agreement: 05/09/2021   Last Fill: 05/09/2021 Tramadol 50 mg 1 to 2 tablets daily as needed for pain relief.   Okay to refill Tramadol?

## 2021-05-31 NOTE — Addendum Note (Signed)
Addended by: Carole Binning on: 05/31/2021 04:52 PM   Modules accepted: Orders

## 2021-06-01 MED ORDER — TRAMADOL HCL 50 MG PO TABS: ORAL_TABLET | ORAL | 0 refills | Status: DC

## 2021-06-09 ENCOUNTER — Other Ambulatory Visit (HOSPITAL_COMMUNITY): Payer: Self-pay

## 2021-06-15 ENCOUNTER — Other Ambulatory Visit (HOSPITAL_COMMUNITY): Payer: Self-pay

## 2021-06-15 ENCOUNTER — Other Ambulatory Visit: Payer: Self-pay | Admitting: Physician Assistant

## 2021-06-15 DIAGNOSIS — M0609 Rheumatoid arthritis without rheumatoid factor, multiple sites: Secondary | ICD-10-CM

## 2021-06-15 MED ORDER — ORENCIA CLICKJECT 125 MG/ML ~~LOC~~ SOAJ
125.0000 mg | SUBCUTANEOUS | 0 refills | Status: DC
  Filled 2021-06-15: qty 4, 28d supply, fill #0
  Filled 2021-07-12: qty 4, 28d supply, fill #1

## 2021-06-15 NOTE — Telephone Encounter (Signed)
Next Visit: 10/27/2021  Last Visit: 05/09/2021  Last Fill: 03/21/2021  [id number] arthritis of multiple sites with negative rheumatoid factor   Current Dose per office note 05/09/2021: Orencia 125 mg sq injections every 7 days  Labs: 04/26/2021 Alk. Phos 117, RBC 3.87, Hgb 10.9, Hct 33.6, MCHC 32.4  TB Gold: 11/08/2020 Neg    Okay to refill Orencia?

## 2021-06-20 ENCOUNTER — Other Ambulatory Visit (HOSPITAL_COMMUNITY): Payer: Self-pay

## 2021-06-30 ENCOUNTER — Telehealth: Payer: Self-pay | Admitting: Rheumatology

## 2021-07-09 ENCOUNTER — Other Ambulatory Visit: Payer: Self-pay | Admitting: Rheumatology

## 2021-07-09 DIAGNOSIS — Z5181 Encounter for therapeutic drug level monitoring: Secondary | ICD-10-CM

## 2021-07-09 DIAGNOSIS — Z79899 Other long term (current) drug therapy: Secondary | ICD-10-CM

## 2021-07-10 NOTE — Telephone Encounter (Signed)
Next Visit: 10/27/2021  Last Visit: 05/09/2021 Tramadol 50 mg 1 to 2 tablets daily as needed for pain relief.  UDS:05/09/2021 UDS is consistent with treatment.   Narc Agreement: 05/09/2021   Last Fill: 06/01/2021  Okay to refill Tramadol?

## 2021-07-12 ENCOUNTER — Other Ambulatory Visit (HOSPITAL_COMMUNITY): Payer: Self-pay

## 2021-07-19 ENCOUNTER — Other Ambulatory Visit (HOSPITAL_COMMUNITY): Payer: Self-pay

## 2021-08-07 ENCOUNTER — Other Ambulatory Visit: Payer: Self-pay | Admitting: Pharmacist

## 2021-08-07 ENCOUNTER — Other Ambulatory Visit: Payer: Self-pay | Admitting: Physician Assistant

## 2021-08-07 ENCOUNTER — Other Ambulatory Visit (HOSPITAL_COMMUNITY): Payer: Self-pay

## 2021-08-07 DIAGNOSIS — Z5181 Encounter for therapeutic drug level monitoring: Secondary | ICD-10-CM

## 2021-08-07 DIAGNOSIS — Z79899 Other long term (current) drug therapy: Secondary | ICD-10-CM

## 2021-08-07 DIAGNOSIS — M0609 Rheumatoid arthritis without rheumatoid factor, multiple sites: Secondary | ICD-10-CM

## 2021-08-07 MED ORDER — ORENCIA CLICKJECT 125 MG/ML ~~LOC~~ SOAJ
125.0000 mg | SUBCUTANEOUS | 0 refills | Status: DC
  Filled 2021-08-07 – 2021-08-08 (×2): qty 4, 28d supply, fill #0

## 2021-08-07 NOTE — Telephone Encounter (Signed)
Rx for Orencia '125mg'$  every 7 days sent to [facility] for hospital-based cost pricing.   Total qty remaining is 53m   DKnox Saliva PharmD, MPH, BCPS, CPP Clinical Pharmacist (Rheumatology and Pulmonology)

## 2021-08-07 NOTE — Telephone Encounter (Signed)
Next Visit: 10/27/2021   Last Visit: 05/09/2021 Tramadol 50 mg 1 to 2 tablets daily as needed for pain relief.   UDS:05/09/2021 UDS is consistent with treatment.    Narc Agreement: 05/09/2021    Last Fill: 07/10/2021   Okay to refill Tramadol?

## 2021-08-08 ENCOUNTER — Other Ambulatory Visit (HOSPITAL_COMMUNITY): Payer: Self-pay

## 2021-08-16 ENCOUNTER — Other Ambulatory Visit (HOSPITAL_COMMUNITY): Payer: Self-pay

## 2021-09-06 ENCOUNTER — Other Ambulatory Visit (HOSPITAL_COMMUNITY): Payer: Self-pay

## 2021-09-06 ENCOUNTER — Other Ambulatory Visit: Payer: Self-pay | Admitting: Physician Assistant

## 2021-09-06 DIAGNOSIS — Z5181 Encounter for therapeutic drug level monitoring: Secondary | ICD-10-CM

## 2021-09-06 DIAGNOSIS — Z79899 Other long term (current) drug therapy: Secondary | ICD-10-CM

## 2021-09-07 ENCOUNTER — Other Ambulatory Visit: Payer: Self-pay | Admitting: *Deleted

## 2021-09-07 ENCOUNTER — Other Ambulatory Visit (HOSPITAL_COMMUNITY): Payer: Self-pay

## 2021-09-07 ENCOUNTER — Telehealth: Payer: Self-pay | Admitting: Rheumatology

## 2021-09-07 ENCOUNTER — Other Ambulatory Visit: Payer: Self-pay | Admitting: Physician Assistant

## 2021-09-07 DIAGNOSIS — Z79899 Other long term (current) drug therapy: Secondary | ICD-10-CM

## 2021-09-07 DIAGNOSIS — M0609 Rheumatoid arthritis without rheumatoid factor, multiple sites: Secondary | ICD-10-CM

## 2021-09-07 NOTE — Telephone Encounter (Signed)
Lab Orders released.  

## 2021-09-07 NOTE — Telephone Encounter (Signed)
Next Visit: 10/27/2021   Last Visit: 05/09/2021 Tramadol 50 mg 1 to 2 tablets daily as needed for pain relief.   UDS:05/09/2021 UDS is consistent with treatment.    Narc Agreement: 05/09/2021    Last Fill: 08/07/2021   Okay to refill Tramadol?

## 2021-09-07 NOTE — Telephone Encounter (Signed)
Patient called the office requesting lab orders be sent to [facility] in [place]. Patient states she is going tomorrow morning.

## 2021-09-08 ENCOUNTER — Telehealth: Payer: Self-pay | Admitting: Rheumatology

## 2021-09-08 NOTE — Telephone Encounter (Signed)
Lab Orders faxed as requested.  

## 2021-09-08 NOTE — Telephone Encounter (Signed)
Patient is at [facility] in [place] and requesting labwork orders be faxed to [phone].  They are not on Epic.

## 2021-09-09 LAB — CBC WITH DIFFERENTIAL/PLATELET
Basophils Absolute: 0 10*3/uL (ref 0.0–0.2)
Basos: 1 %
EOS (ABSOLUTE): 0.1 10*3/uL (ref 0.0–0.4)
Eos: 2 %
Hematocrit: 35.4 % (ref 34.0–46.6)
Hemoglobin: 11.9 g/dL (ref 11.1–15.9)
Immature Grans (Abs): 0 10*3/uL (ref 0.0–0.1)
Immature Granulocytes: 0 %
Lymphocytes Absolute: 1.2 10*3/uL (ref 0.7–3.1)
Lymphs: 20 %
MCH: 29.5 pg (ref 26.6–33.0)
MCHC: 33.6 g/dL (ref 31.5–35.7)
MCV: 88 fL (ref 79–97)
Monocytes Absolute: 0.3 10*3/uL (ref 0.1–0.9)
Monocytes: 6 %
Neutrophils Absolute: 4.1 10*3/uL (ref 1.4–7.0)
Neutrophils: 71 %
Platelets: 258 10*3/uL (ref 150–450)
RBC: 4.04 x10E6/uL (ref 3.77–5.28)
RDW: 13 % (ref 11.7–15.4)
WBC: 5.8 10*3/uL (ref 3.4–10.8)

## 2021-09-09 LAB — CMP14+EGFR
ALT: 7 IU/L (ref 0–32)
AST: 15 IU/L (ref 0–40)
Albumin/Globulin Ratio: 1.4 (ref 1.2–2.2)
Albumin: 4.1 g/dL (ref 3.8–4.8)
Alkaline Phosphatase: 136 IU/L — ABNORMAL HIGH (ref 44–121)
BUN/Creatinine Ratio: 19 (ref 12–28)
BUN: 18 mg/dL (ref 8–27)
Bilirubin Total: 0.3 mg/dL (ref 0.0–1.2)
CO2: 24 mmol/L (ref 20–29)
Calcium: 9.4 mg/dL (ref 8.7–10.3)
Chloride: 102 mmol/L (ref 96–106)
Creatinine, Ser: 0.96 mg/dL (ref 0.57–1.00)
Globulin, Total: 3 g/dL (ref 1.5–4.5)
Glucose: 83 mg/dL (ref 70–99)
Potassium: 4.7 mmol/L (ref 3.5–5.2)
Sodium: 143 mmol/L (ref 134–144)
Total Protein: 7.1 g/dL (ref 6.0–8.5)
eGFR: 62 mL/min/{1.73_m2} (ref >59)

## 2021-09-13 ENCOUNTER — Other Ambulatory Visit (HOSPITAL_COMMUNITY): Payer: Self-pay

## 2021-09-13 ENCOUNTER — Other Ambulatory Visit: Payer: Self-pay | Admitting: Physician Assistant

## 2021-09-13 DIAGNOSIS — M0609 Rheumatoid arthritis without rheumatoid factor, multiple sites: Secondary | ICD-10-CM

## 2021-09-13 MED ORDER — ORENCIA CLICKJECT 125 MG/ML ~~LOC~~ SOAJ
125.0000 mg | SUBCUTANEOUS | 0 refills | Status: DC
  Filled 2021-09-13: qty 4, 28d supply, fill #0
  Filled 2021-10-03: qty 4, 28d supply, fill #1
  Filled 2021-10-30: qty 4, 28d supply, fill #2

## 2021-09-13 NOTE — Telephone Encounter (Signed)
Next Visit: 10/27/2021  Last Visit: 05/09/2021  Last Fill: 08/07/2021 (30 day supply)  [id number] arthritis of multiple sites with negative rheumatoid factor   Current Dose per office note 05/09/2021: Orencia 125 mg sq injections every 7 days  Labs: 09/08/2021 CBC and CMP are normal.  TB Gold: 11/08/2020 Neg     Okay to refill Orencia?

## 2021-09-13 NOTE — Progress Notes (Signed)
CBC and CMP are normal.

## 2021-10-03 ENCOUNTER — Other Ambulatory Visit (HOSPITAL_COMMUNITY): Payer: Self-pay

## 2021-10-04 ENCOUNTER — Other Ambulatory Visit: Payer: Self-pay | Admitting: Rheumatology

## 2021-10-04 DIAGNOSIS — Z79899 Other long term (current) drug therapy: Secondary | ICD-10-CM

## 2021-10-04 DIAGNOSIS — Z5181 Encounter for therapeutic drug level monitoring: Secondary | ICD-10-CM

## 2021-10-04 NOTE — Telephone Encounter (Signed)
Next Visit: 10/27/2021   Last Visit: 05/09/2021   Last Fill: 09/07/2021  UDS:05/09/2021 UDS is consistent with treatment.    Narc Agreement: 05/09/2021  Okay to refill Tramadol?

## 2021-10-10 ENCOUNTER — Other Ambulatory Visit (HOSPITAL_COMMUNITY): Payer: Self-pay

## 2021-10-13 NOTE — Progress Notes (Signed)
Office Visit Note  Patient: [name]             Date of Birth: [birth year]           MRN: [id number]             PCP: Glennie Hawk, MD Referring: Glennie Hawk, MD Visit Date: 10/27/2021 Occupation: '@GUAROCC'$ @  Subjective:  Medication management  History of Present Illness: [name] is a 76 y.o. female with history of seronegative rheumatoid arthritis and osteoarthritis.  She states she continues to have some stiffness in her hands which she relates to using a walker.  She states that the right trigger finger has resolved.  She continues to have some stiffness in her hands and her knee joints.  She denies any history of joint swelling.  She has been taking Orencia subcutaneous injections every week on a regular basis without any side effects.  She has states she still has to take tramadol for pain management.  She takes tramadol only on as needed basis.  She mobilizes with the help of a rollator walker.  Activities of Daily Living:  Patient reports morning stiffness for 30 minutes.   Patient Reports nocturnal pain.  Difficulty dressing/grooming: Denies Difficulty climbing stairs: Reports Difficulty getting out of chair: Reports Difficulty using hands for taps, buttons, cutlery, and/or writing: Reports  Review of Systems  Constitutional:  Negative for fatigue.  HENT:  Positive for mouth dryness. Negative for mouth sores.   Eyes:  Negative for dryness.  Respiratory:  Positive for shortness of breath.   Cardiovascular:  Negative for chest pain and palpitations.  Gastrointestinal:  Positive for constipation. Negative for blood in stool and diarrhea.  Endocrine: Negative for increased urination.  Genitourinary:  Positive for involuntary urination.  Musculoskeletal:  Positive for joint pain, gait problem, joint pain, joint swelling, myalgias, morning stiffness, muscle tenderness and myalgias. Negative for muscle weakness.  Skin:  Negative for color change,  rash, hair loss and sensitivity to sunlight.  Allergic/Immunologic: Negative for susceptible to infections.  Neurological:  Positive for dizziness. Negative for headaches.  Hematological:  Negative for swollen glands.  Psychiatric/Behavioral:  Negative for depressed mood and sleep disturbance. The patient is not nervous/anxious.     PMFS History:  Patient Active Problem List   Diagnosis Date Noted   Unilateral primary osteoarthritis, right knee 11/18/2018   Closed fracture of shaft of metatarsal bone 07/30/2016   Metatarsal fracture 07/30/2016   Displaced fracture of second metatarsal bone, right foot, initial encounter for closed fracture 07/26/2016   Arthritis of shoulder 04/10/2016   High risk medication use 03/16/2016   Primary osteoarthritis of both feet 03/16/2016   Primary osteoarthritis of both knees 03/16/2016   DDD (degenerative disc disease), lumbar 03/16/2016   OSA on CPAP 07/27/2015   Lip numbness 03/19/2015   TIA (transient ischemic attack) 03/19/2015   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[place]) 02/06/2015   Post op infection 02/05/2015   History of lumbar laminectomy for spinal cord decompression 01/17/2015   Lumbar spinal stenosis 01/17/2015   Primary osteoarthritis of left knee 03/25/2014   Rheumatoid arthritis of multiple sites with negative rheumatoid factor ([place]) 03/25/2014   History of total knee replacement, left 03/25/2014   DERMATITIS 09/28/2008   CONSTIPATION 05/25/2008   DYSPNEA ON EXERTION 05/25/2008   ANEMIA, NORMOCYTIC 04/13/2008   ANKLE PAIN, BILATERAL 11/25/2007   HLD (hyperlipidemia) 08/14/2006   Morbid (severe) obesity due to excess calories ([place]) 12/13/2005   CARPAL TUNNEL SYNDROME  12/13/2005   PERIPHERAL NEUROPATHY 12/13/2005   Essential hypertension 12/13/2005   DYSRHYTHMIA, CARDIAC NOS 12/13/2005    Past Medical History:  Diagnosis Date   Anemia    Arthritis    Rheumatoid, followed by Dr. Estanislado Pandy, knees & ankles    Cancer ([place])    CHF  (congestive heart failure) ([place])    pt not aware of this   Constipation    GERD (gastroesophageal reflux disease)    H/O cardiovascular stress test 02/2014   seen by Dr. Einar Gip - told that she was cleared for surgery   Heart murmur    states she's never had any problems   Hypertension    Leg cramps    Shortness of breath dyspnea    with exertion   Sleep apnea    CPAP in use- QO night, study done [facility]- 2007    Stroke [facility]) 03/2015   TIA    Family History  Problem Relation Age of Onset   Seizures Mother    Transient ischemic attack Mother    Hypertension Mother    Diabetes Mother    Congestive Heart Failure Mother    Arthritis/Rheumatoid Mother    Parkinson's disease Mother    Dementia Mother    Heart disease Father        CHF   Stroke Sister    Depression Sister    Arthritis Brother    Arthritis Brother    Arthritis Son    Hypertension Son    Past Surgical History:  Procedure Laterality Date   ABDOMINAL HYSTERECTOMY     CARPAL TUNNEL RELEASE Right    COLONOSCOPY     COLONOSCOPY WITH PROPOFOL N/A 11/13/2019   Procedure: COLONOSCOPY WITH PROPOFOL;  Surgeon: Carol Ada, MD;  Location: [facility];  Service: Endoscopy;  Laterality: N/A;   ESOPHAGOGASTRODUODENOSCOPY (EGD) WITH PROPOFOL N/A 11/13/2019   Procedure: ESOPHAGOGASTRODUODENOSCOPY (EGD) WITH PROPOFOL;  Surgeon: Carol Ada, MD;  Location: [facility];  Service: Endoscopy;  Laterality: N/A;   FOOT SURGERY Bilateral    bunionectomy, calluses , also has several pins & screws   HAND SURGERY     JOINT REPLACEMENT     planned for 03/25/2014   KNEE ARTHROPLASTY     KNEE SURGERY  1990's    LUMBAR LAMINECTOMY/DECOMPRESSION MICRODISCECTOMY N/A 01/17/2015   Procedure: L2-3, L3-4 Decompression;  Surgeon: [name], MD;  Location: [place];  Service: Orthopedics;  Laterality: N/A;   LUMBAR LAMINECTOMY/DECOMPRESSION MICRODISCECTOMY N/A 02/06/2015   Procedure: I AND D LUMBAR WITH WOUND VAC PLACEMENT;  Surgeon: [name], MD;  Location: [place];  Service: Orthopedics;  Laterality: N/A;   MASTECTOMY Right 02/01/2020   ORIF TOE FRACTURE Right 07/30/2016   Procedure: OPEN REDUCTION INTERNAL FIXATION (ORIF) RIGHT 2ND METATARSAL (TOE) FRACTURE;  Surgeon: [name], MD;  Location: [place];  Service: Orthopedics;  Laterality: Right;   POLYPECTOMY  11/13/2019   Procedure: POLYPECTOMY;  Surgeon: Carol Ada, MD;  Location: [facility];  Service: Endoscopy;;   TOTAL KNEE ARTHROPLASTY Left 03/25/2014   Procedure: TOTAL KNEE ARTHROPLASTY;  Surgeon: Garald Balding, MD;  Location: [place];  Service: Orthopedics;  Laterality: Left;   TUBAL LIGATION     Social History   Social History Narrative   Lives with husband.  2 Sons.  Using walker.    Caffeine  Less then 1 cups daily.   Retired.     Immunization History  Administered Date(s) Administered   H1N1 12/23/2007   Influenza Split  11/03/2014   Influenza Whole 12/19/2005, 11/13/2006   Influenza,inj,Quad PF,6+ Mos 09/16/2017   Influenza-Unspecified 11/03/2014   PFIZER Comirnaty(Gray Top)Covid-19 Tri-Sucrose Vaccine 01/28/2019, 02/18/2019, 10/07/2019   PFIZER(Purple Top)SARS-COV-2 Vaccination 01/28/2019, 02/18/2019   Pneumococcal Polysaccharide-23 12/19/2005   Tdap 12/20/2011     Objective: Vital Signs: BP 130/80 (BP Location: Left Arm, Patient Position: Sitting, Cuff Size: Large)   Pulse 75   Resp 17   Ht '4\' 11"'$  (1.499 m)   Wt 229 lb (103.9 kg)   BMI 46.25 kg/m    Physical Exam Vitals and nursing note reviewed.  Constitutional:      Appearance: She is well-developed.  HENT:     Head: Normocephalic and atraumatic.  Eyes:     Conjunctiva/sclera: Conjunctivae normal.  Cardiovascular:     Rate and Rhythm: Normal rate and regular rhythm.     Heart sounds: Normal heart sounds.  Pulmonary:     Effort: Pulmonary effort is normal.     Breath sounds: Normal breath sounds.  Abdominal:     General: Bowel sounds are normal.     Palpations: Abdomen is  soft.  Musculoskeletal:     Cervical back: Normal range of motion.  Lymphadenopathy:     Cervical: No cervical adenopathy.  Skin:    General: Skin is warm and dry.     Capillary Refill: Capillary refill takes less than 2 seconds.  Neurological:     Mental Status: She is alert and oriented to person, place, and time.  Psychiatric:        Behavior: Behavior normal.      Musculoskeletal Exam: She had limited lateral rotation of the cervical spine.  Shoulder abduction was limited to about 90 degrees bilaterally with discomfort.  Elbow joints with good range of motion without any warmth swelling or effusion.  There was no tenderness over wrist joints, MCPs PIPs and DIPs.  Bilateral MCP thickening was noted.  Hip joints could not be assessed in the sitting position.  Knee joints with good range of motion without discomfort.  There was no tenderness over ankles or MTPs.  CDAI Exam: CDAI Score: -- Patient Global: 3 mm; Provider Global: 3 mm Swollen: --; Tender: -- Joint Exam 10/27/2021   No joint exam has been documented for this visit   There is currently no information documented on the homunculus. Go to the Rheumatology activity and complete the homunculus joint exam.  Investigation: No additional findings.  Imaging: No results found.  Recent Labs: Lab Results  Component Value Date   WBC 5.8 09/08/2021   HGB 11.9 09/08/2021   PLT 258 09/08/2021   NA 143 09/08/2021   K 4.7 09/08/2021   CL 102 09/08/2021   CO2 24 09/08/2021   GLUCOSE 83 09/08/2021   BUN 18 09/08/2021   CREATININE 0.96 09/08/2021   BILITOT 0.3 09/08/2021   ALKPHOS 136 (H) 09/08/2021   AST 15 09/08/2021   ALT 7 09/08/2021   PROT 7.1 09/08/2021   ALBUMIN 4.1 09/08/2021   CALCIUM 9.4 09/08/2021   GFRAA 62 11/16/2019   QFTBGOLDPLUS NEGATIVE 11/08/2020    Speciality Comments: Narcotic Agreement 10/30/16 Humira stopped 01/18/20 d/t chemo/radiation Orencia started 09/15/20  Procedures:  No procedures  performed Allergies: Ramipril   Assessment / Plan:     Visit Diagnoses: Rheumatoid arthritis of multiple sites with negative rheumatoid factor (HCC)-she continues to have some discomfort in her wrist joints.  No synovitis was noted.  I believe the discomfort is coming from using the walker.  She  has synovial thickening over bilateral MCP joints.  PIP and DIP thickening was noted.  No synovitis was noted.  She has been taking Orencia injections on the weekly basis without any side effects.  He still has to take tramadol on as needed basis due to intermittent increased discomfort.  High risk medication use - Orencia 125 mg sq injections every 7 days.  Patient initiated Orencia on 09/15/2020.  Previously held Humira prior to starting Chemotherapy and radiation therapy. -Labs obtained on September 08, 2021 CBC and CMP were within normal limits.  TB gold was negative on November 08, 2020.  I advised her to get labs in December and every 3 months to monitor for drug toxicity.  She was advised to get TB Gold in December.  Plan: QuantiFERON-TB Gold Plus.  Information regarding immunization was placed in the AVS.  She was also advised to hold Orencia if she develops any infection and resume after the infection resolves.  Therapeutic drug monitoring - Tramadol 50 mg 1 to 2 tablets daily as needed for pain relief.  UDS & narcotic agreement: 05/09/2021.  We will check UDS with the next labs.  Trigger finger, right middle finger-currently not symptomatic.  History of total knee replacement, left-she had good range of motion without discomfort.  Unilateral primary osteoarthritis, right knee-she has intermittent discomfort.  No warmth swelling or effusion was noted.  Primary osteoarthritis of both feet-she has been lateral ankle joint subluxation.  There was no tenderness over ankles or MTPs.  DDD (degenerative disc disease), lumbar-she is phonic lower back pain.  She mobilizes with the help of a walker.  History of  hypertension-blood pressure was normal today.  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    Follow-Up Instructions: Return in about 5 months (around 03/28/2022) for Rheumatoid arthritis, Osteoarthritis.   Bo Merino, MD  Note - This record has been created using Editor, commissioning.  Chart creation errors have been sought, but may not always  have been located. Such creation errors do not reflect on  the standard of medical care.

## 2021-10-26 ENCOUNTER — Other Ambulatory Visit (HOSPITAL_COMMUNITY): Payer: Self-pay

## 2021-10-27 ENCOUNTER — Encounter: Payer: Self-pay | Admitting: Rheumatology

## 2021-10-27 ENCOUNTER — Ambulatory Visit: Payer: Medicare PPO | Attending: Rheumatology | Admitting: Rheumatology

## 2021-10-27 VITALS — BP 130/80 | HR 75 | Resp 17 | Ht 59.0 in | Wt 229.0 lb

## 2021-10-27 DIAGNOSIS — Z8669 Personal history of other diseases of the nervous system and sense organs: Secondary | ICD-10-CM

## 2021-10-27 DIAGNOSIS — C50911 Malignant neoplasm of unspecified site of right female breast: Secondary | ICD-10-CM

## 2021-10-27 DIAGNOSIS — M65331 Trigger finger, right middle finger: Secondary | ICD-10-CM | POA: Diagnosis not present

## 2021-10-27 DIAGNOSIS — M0609 Rheumatoid arthritis without rheumatoid factor, multiple sites: Secondary | ICD-10-CM

## 2021-10-27 DIAGNOSIS — Z5181 Encounter for therapeutic drug level monitoring: Secondary | ICD-10-CM

## 2021-10-27 DIAGNOSIS — Z8639 Personal history of other endocrine, nutritional and metabolic disease: Secondary | ICD-10-CM

## 2021-10-27 DIAGNOSIS — M5136 Other intervertebral disc degeneration, lumbar region: Secondary | ICD-10-CM

## 2021-10-27 DIAGNOSIS — Z79899 Other long term (current) drug therapy: Secondary | ICD-10-CM

## 2021-10-27 DIAGNOSIS — M19071 Primary osteoarthritis, right ankle and foot: Secondary | ICD-10-CM

## 2021-10-27 DIAGNOSIS — Z8673 Personal history of transient ischemic attack (TIA), and cerebral infarction without residual deficits: Secondary | ICD-10-CM

## 2021-10-27 DIAGNOSIS — M1711 Unilateral primary osteoarthritis, right knee: Secondary | ICD-10-CM

## 2021-10-27 DIAGNOSIS — Z8679 Personal history of other diseases of the circulatory system: Secondary | ICD-10-CM

## 2021-10-27 DIAGNOSIS — Z96652 Presence of left artificial knee joint: Secondary | ICD-10-CM

## 2021-10-27 DIAGNOSIS — M19072 Primary osteoarthritis, left ankle and foot: Secondary | ICD-10-CM

## 2021-10-27 NOTE — Patient Instructions (Signed)
Standing Labs We placed an order today for your standing lab work.   Please have your standing labs drawn in December and every 3 months TB Gold with your next labs in December  Please have your labs drawn 2 weeks prior to your appointment so that the provider can discuss your lab results at your appointment.  Please note that you may see your imaging and lab results in [place] before we have reviewed them. We will contact you once all results are reviewed. Please allow our office up to 72 hours to thoroughly review all of the results before contacting the office for clarification of your results.  Lab hours are: Monday through Thursday from 1:30 pm-4:30 pm and Friday from 1:30 pm- 4:00 pm  You may experience shorter wait times on Monday, Thursday or Friday afternoons,.   Effective November 06, 2021, new lab hours will be: Monday through Thursday from 8:00 am -12:30 pm and 1:00 pm-5:00 pm and Friday from 8:00 am-12:00 pm.  Please be advised, all patients with office appointments requiring lab work will take precedent over walk-in lab work.   Labs are drawn by [facility]. Please bring your co-pay at the time of your lab draw.  You may receive a bill from [place] for your lab work.  Please note if you are on Hydroxychloroquine and and an order has been placed for a Hydroxychloroquine level, you will need to have it drawn 4 hours or more after your last dose.  If you wish to have your labs drawn at another location, please call the office 24 hours in advance so we can fax the orders.  The office is located at [address], [place], [place], [place] [address] No appointment is necessary.    If you have any questions regarding directions or hours of operation,  please call [phone].   As a reminder, please drink plenty of water prior to coming for your lab work. Thanks!   If you have signs or symptoms of an infection or start antibiotics: First, call your PCP for workup of your  infection. Hold your medication through the infection, until you complete your antibiotics, and until symptoms resolve if you take the following: Injectable medication (Actemra, Benlysta, Cimzia, Cosentyx, Enbrel, Humira, Kevzara, Orencia, Remicade, Simponi, Stelara, Taltz, Tremfya) Methotrexate Leflunomide (Arava) Mycophenolate (Cellcept) Morrie Sheldon, Olumiant, or Rinvoq   COVID-19 vaccine recommendations:   COVID-19 vaccine is recommended for everyone (unless you are allergic to a vaccine component), even if you are on a medication that suppresses your immune system.    If you are on Orencia subcutaneous injection - hold medication one week prior to and one week after the first COVID-19 vaccine dose (only).     Do not take Tylenol or any anti-inflammatory medications (NSAIDs) 24 hours prior to the COVID-19 vaccination.   There is no direct evidence about the efficacy of the COVID-19 vaccine in individuals who are on medications that suppress the immune system.   Even if you are fully vaccinated, and you are on any medications that suppress your immune system, please continue to wear a mask, maintain at least six feet social distance and practice hand hygiene.    Please see the following web sites for updated information.   [email or web address]://[email or web address]/[email or web address]-Vaccination-Patient-Resources.[email or web address]   If you have signs or symptoms of an infection or start antibiotics: First, call your PCP for workup of your infection. Hold your medication through the infection, until you complete your antibiotics, and until symptoms resolve if you take the following:  Injectable medication (Actemra, Benlysta, Cimzia, Cosentyx, Enbrel, Humira, Kevzara, Orencia, Remicade, Simponi, Stelara, Chinle, Tremfya) Methotrexate Leflunomide (Arava) Mycophenolate (Cellcept) Roma Kayser, or Rinvoq

## 2021-10-30 ENCOUNTER — Other Ambulatory Visit (HOSPITAL_COMMUNITY): Payer: Self-pay

## 2021-11-08 ENCOUNTER — Other Ambulatory Visit: Payer: Self-pay | Admitting: Rheumatology

## 2021-11-08 DIAGNOSIS — Z79899 Other long term (current) drug therapy: Secondary | ICD-10-CM

## 2021-11-08 DIAGNOSIS — Z5181 Encounter for therapeutic drug level monitoring: Secondary | ICD-10-CM

## 2021-11-08 NOTE — Telephone Encounter (Signed)
Next Visit: 03/30/2022  Last Visit: 10/27/2021  UDS:05/09/2021  Narc Agreement: 05/09/2021  Last Fill: 10/04/2021 Tramadol 50 mg 1 to 2 tablets daily as needed for pain relief.   Okay to refill Tramadol?

## 2021-11-15 ENCOUNTER — Other Ambulatory Visit (HOSPITAL_COMMUNITY): Payer: Self-pay

## 2021-12-06 ENCOUNTER — Other Ambulatory Visit: Payer: Self-pay | Admitting: Rheumatology

## 2021-12-06 DIAGNOSIS — Z5181 Encounter for therapeutic drug level monitoring: Secondary | ICD-10-CM

## 2021-12-06 DIAGNOSIS — Z79899 Other long term (current) drug therapy: Secondary | ICD-10-CM

## 2021-12-07 NOTE — Telephone Encounter (Signed)
Next Visit: 03/30/2022   Last Visit: 10/27/2021   UDS:05/09/2021   Narc Agreement: 05/09/2021   Last Fill: 11/08/2021 Tramadol 50 mg 1 to 2 tablets daily as needed for pain relief.    Okay to refill Tramadol?

## 2021-12-12 ENCOUNTER — Other Ambulatory Visit: Payer: Self-pay | Admitting: Physician Assistant

## 2021-12-12 ENCOUNTER — Other Ambulatory Visit (HOSPITAL_COMMUNITY): Payer: Self-pay

## 2021-12-12 DIAGNOSIS — M0609 Rheumatoid arthritis without rheumatoid factor, multiple sites: Secondary | ICD-10-CM

## 2021-12-12 MED ORDER — ORENCIA CLICKJECT 125 MG/ML ~~LOC~~ SOAJ
125.0000 mg | SUBCUTANEOUS | 0 refills | Status: DC
  Filled 2021-12-12: qty 4, 28d supply, fill #0
  Filled 2022-01-09: qty 4, 28d supply, fill #1
  Filled 2022-02-05: qty 4, 28d supply, fill #2

## 2021-12-12 NOTE — Telephone Encounter (Signed)
Next Visit: 03/30/2022  Last Visit: 10/27/2021  Last Fill: 09/13/2021  [id number] arthritis of multiple sites with negative rheumatoid factor   Current Dose per office note on 10/27/2021: Orencia 125 mg sq injections every 7 days.   Labs: 10/16/2021 RBC 3.93, hemoglobin 11.5, hematocrit 35.2, MCHC 32.7, alkaline phosphatase 112, GFR 59.  TB Gold: 11/08/2020 negative    Attempted to contact patient and left message to advise patient that she is due to update TB Gold.   Okay to refill orencia?

## 2021-12-13 ENCOUNTER — Other Ambulatory Visit (HOSPITAL_COMMUNITY): Payer: Self-pay

## 2021-12-19 ENCOUNTER — Other Ambulatory Visit: Payer: Self-pay

## 2021-12-19 ENCOUNTER — Other Ambulatory Visit (HOSPITAL_COMMUNITY): Payer: Self-pay

## 2021-12-26 ENCOUNTER — Telehealth: Payer: Self-pay | Admitting: Pharmacist

## 2021-12-26 NOTE — Telephone Encounter (Signed)
Per [place], [place] for Orencia set to expire. Submitted a Prior Authorization RENEWAL request to [facility] for [facility] via CoverMyMeds. Will update once we receive a response.  Key: [id number]  Knox Saliva, PharmD, MPH, BCPS, CPP Clinical Pharmacist (Rheumatology and Pulmonology)

## 2021-12-27 NOTE — Telephone Encounter (Signed)
Received notification from [facility] regarding a prior authorization for [facility]. Authorization has been APPROVED from 12/26/2021 to 01/08/2023. Approval letter sent to scan center.  Patient can continue to fill through [place]: [phone]   Authorization # [id number] Phone # [phone]  Knox Saliva, PharmD, MPH, BCPS, CPP Clinical Pharmacist (Rheumatology and Pulmonology)

## 2022-01-05 ENCOUNTER — Other Ambulatory Visit: Payer: Self-pay | Admitting: Physician Assistant

## 2022-01-05 DIAGNOSIS — Z5181 Encounter for therapeutic drug level monitoring: Secondary | ICD-10-CM

## 2022-01-05 DIAGNOSIS — Z79899 Other long term (current) drug therapy: Secondary | ICD-10-CM

## 2022-01-09 ENCOUNTER — Other Ambulatory Visit (HOSPITAL_COMMUNITY): Payer: Self-pay

## 2022-01-09 ENCOUNTER — Encounter: Payer: Self-pay | Admitting: *Deleted

## 2022-01-09 NOTE — Telephone Encounter (Signed)
Next Visit: 03/30/2022   Last Visit: 10/27/2021   UDS:05/09/2021   Narc Agreement: 05/09/2021   Last Fill: 12/07/2021 Tramadol 50 mg 1 to 2 tablets daily as needed for pain relief.   Message sent to patient to advised she is due to update her UDS.    Okay to refill Tramadol?

## 2022-01-10 ENCOUNTER — Other Ambulatory Visit: Payer: Self-pay | Admitting: *Deleted

## 2022-01-10 DIAGNOSIS — Z79899 Other long term (current) drug therapy: Secondary | ICD-10-CM

## 2022-01-10 DIAGNOSIS — Z5181 Encounter for therapeutic drug level monitoring: Secondary | ICD-10-CM

## 2022-01-10 NOTE — Addendum Note (Signed)
Addended by: Carole Binning on: 01/10/2022 08:10 AM   Modules accepted: Orders

## 2022-01-12 LAB — DRUG PROFILE, UR, 9 DRUGS (LABCORP)
Amphetamines, Urine: NEGATIVE ng/mL
Barbiturate Quant, Ur: NEGATIVE ng/mL
Benzodiazepine Quant, Ur: NEGATIVE ng/mL
Cannabinoid Quant, Ur: NEGATIVE ng/mL
Cocaine (Metab.): NEGATIVE ng/mL
Methadone Screen, Urine: NEGATIVE ng/mL
Opiate Quant, Ur: NEGATIVE ng/mL
PCP Quant, Ur: NEGATIVE ng/mL
Propoxyphene: NEGATIVE ng/mL

## 2022-01-12 LAB — TRAMADOL SCREEN, URINE: Tramadol Screen, Urine: POSITIVE ng/mL — AB

## 2022-01-14 LAB — QUANTIFERON-TB GOLD PLUS
QuantiFERON Mitogen Value: 3.55 IU/mL
QuantiFERON Nil Value: 0.03 IU/mL
QuantiFERON TB1 Ag Value: 0.07 IU/mL
QuantiFERON TB2 Ag Value: 0.03 IU/mL
QuantiFERON-TB Gold Plus: NEGATIVE

## 2022-01-14 LAB — CMP14+EGFR
ALT: 8 IU/L (ref 0–32)
AST: 12 IU/L (ref 0–40)
Albumin/Globulin Ratio: 1.3 (ref 1.2–2.2)
Albumin: 4.3 g/dL (ref 3.8–4.8)
Alkaline Phosphatase: 157 IU/L — ABNORMAL HIGH (ref 44–121)
BUN/Creatinine Ratio: 18 (ref 12–28)
BUN: 19 mg/dL (ref 8–27)
Bilirubin Total: 0.2 mg/dL (ref 0.0–1.2)
CO2: 22 mmol/L (ref 20–29)
Calcium: 10.1 mg/dL (ref 8.7–10.3)
Chloride: 103 mmol/L (ref 96–106)
Creatinine, Ser: 1.03 mg/dL — ABNORMAL HIGH (ref 0.57–1.00)
Globulin, Total: 3.2 g/dL (ref 1.5–4.5)
Glucose: 84 mg/dL (ref 70–99)
Potassium: 4.3 mmol/L (ref 3.5–5.2)
Sodium: 142 mmol/L (ref 134–144)
Total Protein: 7.5 g/dL (ref 6.0–8.5)
eGFR: 56 mL/min/{1.73_m2} — ABNORMAL LOW (ref >59)

## 2022-01-14 LAB — CBC WITH DIFFERENTIAL/PLATELET
Basophils Absolute: 0.1 10*3/uL (ref 0.0–0.2)
Basos: 1 %
EOS (ABSOLUTE): 0.2 10*3/uL (ref 0.0–0.4)
Eos: 3 %
Hematocrit: 35.8 % (ref 34.0–46.6)
Hemoglobin: 11.6 g/dL (ref 11.1–15.9)
Immature Grans (Abs): 0 10*3/uL (ref 0.0–0.1)
Immature Granulocytes: 0 %
Lymphocytes Absolute: 1.6 10*3/uL (ref 0.7–3.1)
Lymphs: 22 %
MCH: 28.5 pg (ref 26.6–33.0)
MCHC: 32.4 g/dL (ref 31.5–35.7)
MCV: 88 fL (ref 79–97)
Monocytes Absolute: 0.4 10*3/uL (ref 0.1–0.9)
Monocytes: 6 %
Neutrophils Absolute: 5.1 10*3/uL (ref 1.4–7.0)
Neutrophils: 68 %
Platelets: 280 10*3/uL (ref 150–450)
RBC: 4.07 x10E6/uL (ref 3.77–5.28)
RDW: 12.6 % (ref 11.7–15.4)
WBC: 7.4 10*3/uL (ref 3.4–10.8)

## 2022-01-15 ENCOUNTER — Other Ambulatory Visit: Payer: Self-pay | Admitting: *Deleted

## 2022-01-15 DIAGNOSIS — M0609 Rheumatoid arthritis without rheumatoid factor, multiple sites: Secondary | ICD-10-CM

## 2022-01-15 DIAGNOSIS — Z79899 Other long term (current) drug therapy: Secondary | ICD-10-CM

## 2022-01-15 DIAGNOSIS — Z5181 Encounter for therapeutic drug level monitoring: Secondary | ICD-10-CM

## 2022-01-15 NOTE — Progress Notes (Signed)
UDS is consistent with tramadol use.  TB Gold is negative.  CBC normal, CMP shows elevated creatinine, most likely due to diuretic use. Alkaline phosphatase is elevated.  Please ask if patient had recent injury.  Will recheck alkaline phosphatase with the next labs in 3 months.

## 2022-01-16 ENCOUNTER — Telehealth: Payer: Self-pay | Admitting: *Deleted

## 2022-01-16 ENCOUNTER — Other Ambulatory Visit: Payer: Self-pay

## 2022-01-16 NOTE — Telephone Encounter (Signed)
Patient contacted the office and left message stating she had questions about her lab results.  Attempted to contact the patient and unable to leave a message.

## 2022-01-17 NOTE — Telephone Encounter (Signed)
Spoke with patient and she inquired about the elevated Alkaline Phosphatase. Patient advised that test could help diagnose bone disorders. Patient advised Dr. Estanislado Pandy wants to monitor it for now. Patient advised in the future Dr. Estanislado Pandy may order a total body bone scan if it continues to be elevated. Patient expressed understanding.

## 2022-02-05 ENCOUNTER — Other Ambulatory Visit (HOSPITAL_COMMUNITY): Payer: Self-pay

## 2022-02-06 ENCOUNTER — Other Ambulatory Visit: Payer: Self-pay | Admitting: Rheumatology

## 2022-02-06 DIAGNOSIS — Z79899 Other long term (current) drug therapy: Secondary | ICD-10-CM

## 2022-02-06 DIAGNOSIS — Z5181 Encounter for therapeutic drug level monitoring: Secondary | ICD-10-CM

## 2022-02-07 NOTE — Telephone Encounter (Signed)
Next Visit: 03/30/2022  Last Visit: 10/27/2021  UDS:01/10/2022 c/w  Narc Agreement: 05/09/2021  Last Fill: 01/09/2022  Okay to refill tramadol?

## 2022-02-13 ENCOUNTER — Other Ambulatory Visit: Payer: Self-pay

## 2022-03-06 ENCOUNTER — Other Ambulatory Visit: Payer: Self-pay | Admitting: Physician Assistant

## 2022-03-06 ENCOUNTER — Other Ambulatory Visit (HOSPITAL_COMMUNITY): Payer: Self-pay

## 2022-03-06 DIAGNOSIS — M0609 Rheumatoid arthritis without rheumatoid factor, multiple sites: Secondary | ICD-10-CM

## 2022-03-06 MED ORDER — ORENCIA CLICKJECT 125 MG/ML ~~LOC~~ SOAJ
125.0000 mg | SUBCUTANEOUS | 0 refills | Status: DC
  Filled 2022-03-06: qty 4, 28d supply, fill #0
  Filled 2022-04-17: qty 4, 28d supply, fill #1
  Filled 2022-05-09: qty 4, 28d supply, fill #2

## 2022-03-06 NOTE — Telephone Encounter (Signed)
Next Visit: 03/23/2022  Last Visit: 10/27/2021  Last Fill: 12/12/2021  DX: Rheumatoid arthritis of multiple sites with negative rheumatoid factor   Current Dose per office note 10/27/2021: Orencia 125 mg sq injections every 7 days   Labs: 01/10/2022 CBC normal, CMP shows elevated creatinine, most likely due to diuretic use. Alkaline phosphatase is elevated.   TB Gold: 01/10/2022 Neg    Okay to refill Orencia?

## 2022-03-12 ENCOUNTER — Other Ambulatory Visit: Payer: Self-pay

## 2022-03-12 NOTE — Progress Notes (Signed)
Office Visit Note  Patient: [name]             Date of Birth: 1945/08/09           MRN: [id number]             PCP: Glennie Hawk, MD Referring: Glennie Hawk, MD Visit Date: 03/23/2022 Occupation: @GUAROCC @  Subjective:  Right knee joint pain   History of Present Illness: [name] is a 77 y.o. female with history of seronegative rheumatoid arthritis, osteoarthritis, and DDD. She will remain on Orencia 125 mg sq injections every 7 days. Patient initiated Orencia on 09/15/2020. She continues to tolerate orencia without any side effects.  She reports she had to miss her dose of orencia last week due to delays receiving the shipment.  She was able to restart on orencia yesterday.  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  She attributes the recent flare to the missed dose of Orencia and repetitive activities.  She states that her right wrist pain has started to improve since resuming Orencia yesterday.  She states that she has been having increased pain in the right knee joint.  She is scheduled to be evaluated by Dr. Laurance Flatten on 04/10/2022 to discuss the next steps in treatment for management of osteoarthritis of her right knee.  She continues to use a rollator walker or cane to assist with ambulation.  She denies any recent falls.  She denies any recent or recurrent infections.  She denies any new medical conditions.    Activities of Daily Living:  Patient reports morning stiffness for 10-15 minutes.   Patient Reports nocturnal pain.  Difficulty dressing/grooming: Denies Difficulty climbing stairs: Reports Difficulty getting out of chair: Reports Difficulty using hands for taps, buttons, cutlery, and/or writing: Reports  Review of Systems  Constitutional:  Negative for fatigue.  HENT:  Positive for mouth dryness. Negative  for mouth sores.   Eyes:  Negative for dryness.  Respiratory:  Negative for shortness of breath.   Cardiovascular:  Negative for chest pain and palpitations.  Gastrointestinal:  Positive for constipation. Negative for blood in stool and diarrhea.  Endocrine: Negative for increased urination.  Genitourinary:  Positive for involuntary urination.  Musculoskeletal:  Positive for joint pain, joint pain, joint swelling, morning stiffness and muscle tenderness. Negative for gait problem, myalgias, muscle weakness and myalgias.  Skin:  Negative for color change, rash, hair loss and sensitivity to sunlight.  Allergic/Immunologic: Negative for susceptible to infections.  Neurological:  Positive for dizziness. Negative for headaches.  Hematological:  Negative for swollen glands.  Psychiatric/Behavioral:  Negative for depressed mood and sleep disturbance. The patient is not nervous/anxious.     PMFS History:  Patient Active Problem List   Diagnosis Date Noted   Unilateral primary osteoarthritis, right knee 11/18/2018   Closed fracture of shaft of metatarsal bone 07/30/2016   Metatarsal fracture 07/30/2016   Displaced fracture of second metatarsal bone, right foot, initial encounter for closed fracture 07/26/2016   Arthritis of shoulder 04/10/2016   High risk medication use 03/16/2016   Primary osteoarthritis of both feet 03/16/2016   Primary osteoarthritis of both knees 03/16/2016   DDD (degenerative disc disease), lumbar 03/16/2016   OSA on CPAP 07/27/2015   Lip numbness 03/19/2015   TIA (transient ischemic attack) 03/19/2015   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  [place]) 02/06/2015   Post op infection 02/05/2015   History of lumbar laminectomy for spinal cord decompression 01/17/2015   Lumbar spinal stenosis 01/17/2015   Primary osteoarthritis of left knee 03/25/2014   Rheumatoid arthritis of multiple sites with negative rheumatoid factor ([place]) 03/25/2014   History of total knee replacement, left  03/25/2014   DERMATITIS 09/28/2008   CONSTIPATION 05/25/2008   DYSPNEA ON EXERTION 05/25/2008   ANEMIA, NORMOCYTIC 04/13/2008   ANKLE PAIN, BILATERAL 11/25/2007   HLD (hyperlipidemia) 08/14/2006   Morbid (severe) obesity due to excess calories ([place]) 12/13/2005   CARPAL TUNNEL SYNDROME 12/13/2005   PERIPHERAL NEUROPATHY 12/13/2005   Essential hypertension 12/13/2005   DYSRHYTHMIA, CARDIAC NOS 12/13/2005    Past Medical History:  Diagnosis Date   Anemia    Arthritis    Rheumatoid, followed by Dr. Estanislado Pandy, knees & ankles    Cancer ([place])    CHF (congestive heart failure) ([place])    pt not aware of this   Constipation    GERD (gastroesophageal reflux disease)    H/O cardiovascular stress test 02/2014   seen by Dr. Einar Gip - told that she was cleared for surgery   Heart murmur    states she's never had any problems   Hypertension    Leg cramps    Shortness of breath dyspnea    with exertion   Sleep apnea    CPAP in use- QO night, study done at [facility]- 2007    Stroke [facility]) 03/2015   TIA    Family History  Problem Relation Age of Onset   Seizures Mother    Transient ischemic attack Mother    Hypertension Mother    Diabetes Mother    Congestive Heart Failure Mother    Arthritis/Rheumatoid Mother    Parkinson's disease Mother    Dementia Mother    Heart disease Father        CHF   Stroke Sister    Depression Sister    Arthritis Brother    Arthritis Brother    Arthritis Son    Hypertension Son    Past Surgical History:  Procedure Laterality Date   ABDOMINAL HYSTERECTOMY     CARPAL TUNNEL RELEASE Right    COLONOSCOPY     COLONOSCOPY WITH PROPOFOL N/A 11/13/2019   Procedure: COLONOSCOPY WITH PROPOFOL;  Surgeon: Carol Ada, MD;  Location: [facility];  Service: Endoscopy;  Laterality: N/A;   ESOPHAGOGASTRODUODENOSCOPY (EGD) WITH PROPOFOL N/A 11/13/2019   Procedure: ESOPHAGOGASTRODUODENOSCOPY (EGD) WITH PROPOFOL;  Surgeon: Carol Ada, MD;  Location: [facility];   Service: Endoscopy;  Laterality: N/A;   FOOT SURGERY Bilateral    bunionectomy, calluses , also has several pins & screws   HAND SURGERY     JOINT REPLACEMENT     planned for 03/25/2014   KNEE ARTHROPLASTY     KNEE SURGERY  1990's    LUMBAR LAMINECTOMY/DECOMPRESSION MICRODISCECTOMY N/A 01/17/2015   Procedure: L2-3, L3-4 Decompression;  Surgeon: [name], MD;  Location: [place];  Service: Orthopedics;  Laterality: N/A;   LUMBAR LAMINECTOMY/DECOMPRESSION MICRODISCECTOMY N/A 02/06/2015   Procedure: I AND D LUMBAR WITH WOUND VAC PLACEMENT;  Surgeon: [name], MD;  Location: [place];  Service: Orthopedics;  Laterality: N/A;   MASTECTOMY Right 02/01/2020   ORIF TOE FRACTURE Right 07/30/2016   Procedure: OPEN REDUCTION INTERNAL FIXATION (ORIF) RIGHT 2ND METATARSAL (TOE) FRACTURE;  Surgeon: [name], MD;  Location: [place];  Service: Orthopedics;  Laterality: Right;   POLYPECTOMY  11/13/2019  Procedure: POLYPECTOMY;  Surgeon: Carol Ada, MD;  Location: [facility];  Service: Endoscopy;;   TOTAL KNEE ARTHROPLASTY Left 03/25/2014   Procedure: TOTAL KNEE ARTHROPLASTY;  Surgeon: Garald Balding, MD;  Location: [place];  Service: Orthopedics;  Laterality: Left;   TUBAL LIGATION     Social History   Social History Narrative   Lives with husband.  2 Sons.  Using walker.    Caffeine  Less then 1 cups daily.   Retired.     Immunization History  Administered Date(s) Administered   H1N1 12/23/2007   Influenza Split 11/03/2014   Influenza Whole 12/19/2005, 11/13/2006   Influenza,inj,Quad PF,6+ Mos 09/16/2017   Influenza-Unspecified 11/03/2014   PFIZER Comirnaty(Gray Top)Covid-19 Tri-Sucrose Vaccine 01/28/2019, 02/18/2019, 10/07/2019   PFIZER(Purple Top)SARS-COV-2 Vaccination 01/28/2019, 02/18/2019   Pneumococcal Polysaccharide-23 12/19/2005   Tdap 12/20/2011     Objective: Vital Signs: BP 126/79 (BP Location: Left Arm, Patient Position: Sitting, Cuff Size: Large)   Pulse 69   Resp 14    Ht 4\' 11"  (1.499 m)   Wt 215 lb (97.5 kg)   BMI 43.42 kg/m    Physical Exam Vitals and nursing note reviewed.  Constitutional:      Appearance: She is well-developed.  HENT:     Head: Normocephalic and atraumatic.  Eyes:     Conjunctiva/sclera: Conjunctivae normal.  Cardiovascular:     Rate and Rhythm: Normal rate and regular rhythm.     Heart sounds: Normal heart sounds.  Pulmonary:     Effort: Pulmonary effort is normal.     Breath sounds: Normal breath sounds.  Abdominal:     General: Bowel sounds are normal.     Palpations: Abdomen is soft.  Musculoskeletal:     Cervical back: Normal range of motion.  Skin:    General: Skin is warm and dry.     Capillary Refill: Capillary refill takes less than 2 seconds.  Neurological:     Mental Status: She is alert and oriented to person, place, and time.  Psychiatric:        Behavior: Behavior normal.      Musculoskeletal Exam: C-spine has limited range of motion with lateral rotation.  Shoulder abduction limited to about 90 degrees bilaterally.  Elbow joints have good range of motion with no discomfort.  Synovial thickening of both wrist joints with some tenderness and mild inflammation in the right wrist.  Thickening of MCP joints but no tenderness or synovitis today.  She has tenderness over the left fifth PIP joint.  PIP and DIP thickening consistent with osteoarthritis of both hands noted.  Hip joints difficult to assess in seated position.  Left knee replacement has good range of motion with no discomfort.  Right knee joint has good range of motion with discomfort and stiffness.  Subluxation of both ankle joints with synovial thickening but no tenderness or synovitis today.  CDAI Exam: CDAI Score: 1.6  Patient Global: 3 mm; Provider Global: 3 mm Swollen: 0 ; Tender: 1  Joint Exam 03/23/2022      Right  Left  Wrist   Tender        Investigation: No additional findings.  Imaging: No results found.  Recent Labs: Lab  Results  Component Value Date   WBC 7.4 01/10/2022   HGB 11.6 01/10/2022   PLT 280 01/10/2022   NA 142 01/10/2022   K 4.3 01/10/2022   CL 103 01/10/2022   CO2 22 01/10/2022   GLUCOSE 84 01/10/2022   BUN 19  01/10/2022   CREATININE 1.03 (H) 01/10/2022   BILITOT 0.2 01/10/2022   ALKPHOS 157 (H) 01/10/2022   AST 12 01/10/2022   ALT 8 01/10/2022   PROT 7.5 01/10/2022   ALBUMIN 4.3 01/10/2022   CALCIUM 10.1 01/10/2022   GFRAA 62 11/16/2019   QFTBGOLDPLUS Negative 01/10/2022    Speciality Comments: Narcotic Agreement 10/30/16 Humira stopped 01/18/20 d/t chemo/radiation Orencia started 09/15/20  Procedures:  No procedures performed Allergies: Ramipril   Assessment / Plan:     Visit Diagnoses: Rheumatoid arthritis of multiple sites with negative rheumatoid factor [facility]): Patient presents today with some increased tenderness and mild inflammation in the right wrist joint.  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  She was able to restart Orencia yesterday and her symptoms have gradually started to improve.  She is prescribed Orencia 125 mg subcutaneous injections once weekly.  Overall she has continued to find Orencia to be effective at managing her rheumatoid arthritis.  She has been having infrequent flares.  She will remain on Orencia as prescribed.  She will continue to take tramadol as needed for pain relief.  She will follow-up in the office in 5 months or sooner if needed.  High risk medication use - Orencia 125 mg sq injections every 7 days.  Patient initiated Orencia on 09/15/2020.  Previously held Humira prior to starting Chemotherapy and radiation therapy. CBC and CMP updated on 01/10/22.  Orders for CBC and CMP were released today.  Her next lab work will be due in June and every 3 months to monitor for drug toxicity. TB gold negative on 01/10/22.   No recent or recurrent infections.  Discussed  the importance of holding orencia if she develops signs or symptoms of an infection and to resume once the infection has completely cleared.  - Plan: CBC with Differential/Platelet, COMPLETE METABOLIC PANEL WITH GFR  Therapeutic drug monitoring - Tramadol 50 mg 1 to 2 tablets daily as needed for pain relief.  UDS and narcotic agreement will be due in July 2024.  Trigger finger, right middle finger: Resolved.  History of total knee replacement, left: Doing well.  Good range of motion with no discomfort.  She either uses a cane or rollator walker to assist with ambulation.  Unilateral primary osteoarthritis, right knee: She has been experiencing increased pain and stiffness in the right knee joint.  She has an upcoming appointment scheduled with Dr. Laurance Flatten at [facility] on 04/10/2022 for further evaluation and to discuss future treatment options.  Primary osteoarthritis of both feet: Subluxation of both ankle joints with synovial thickening.  DDD (degenerative disc disease), lumbar: She is not experiencing any increased discomfort in her lower back currently.  No symptoms of radiculopathy.  Other medical conditions are listed as follows:  History of hypertension: Blood pressure is 126/79 today in the office.  History of hyperlipidemia  History of TIA (transient ischemic attack)  Invasive ductal carcinoma of right breast ([place])  History of sleep apnea  Orders: Orders Placed This Encounter  Procedures   CBC with Differential/Platelet   COMPLETE METABOLIC PANEL WITH GFR   No orders of the defined types were placed in this encounter.    Follow-Up Instructions: Return in about 5 months (around 08/23/2022) for Rheumatoid arthritis, Osteoarthritis, DDD.   Ofilia Neas, PA-C  Note - This record has been created using Dragon software.  Chart creation errors have been sought, but may not  always  have been located. Such creation errors do not reflect on  the standard of  medical care.

## 2022-03-19 ENCOUNTER — Other Ambulatory Visit: Payer: Self-pay

## 2022-03-20 ENCOUNTER — Other Ambulatory Visit: Payer: Self-pay

## 2022-03-20 ENCOUNTER — Other Ambulatory Visit (HOSPITAL_COMMUNITY): Payer: Self-pay

## 2022-03-21 ENCOUNTER — Other Ambulatory Visit (HOSPITAL_COMMUNITY): Payer: Self-pay

## 2022-03-23 ENCOUNTER — Ambulatory Visit: Payer: Medicare PPO | Attending: Physician Assistant | Admitting: Physician Assistant

## 2022-03-23 ENCOUNTER — Encounter: Payer: Self-pay | Admitting: Physician Assistant

## 2022-03-23 VITALS — BP 126/79 | HR 69 | Resp 14 | Ht 59.0 in | Wt 215.0 lb

## 2022-03-23 DIAGNOSIS — Z79899 Other long term (current) drug therapy: Secondary | ICD-10-CM | POA: Diagnosis not present

## 2022-03-23 DIAGNOSIS — Z8673 Personal history of transient ischemic attack (TIA), and cerebral infarction without residual deficits: Secondary | ICD-10-CM

## 2022-03-23 DIAGNOSIS — M0609 Rheumatoid arthritis without rheumatoid factor, multiple sites: Secondary | ICD-10-CM | POA: Diagnosis not present

## 2022-03-23 DIAGNOSIS — M1711 Unilateral primary osteoarthritis, right knee: Secondary | ICD-10-CM

## 2022-03-23 DIAGNOSIS — C50911 Malignant neoplasm of unspecified site of right female breast: Secondary | ICD-10-CM

## 2022-03-23 DIAGNOSIS — M19071 Primary osteoarthritis, right ankle and foot: Secondary | ICD-10-CM

## 2022-03-23 DIAGNOSIS — Z8679 Personal history of other diseases of the circulatory system: Secondary | ICD-10-CM

## 2022-03-23 DIAGNOSIS — M19072 Primary osteoarthritis, left ankle and foot: Secondary | ICD-10-CM

## 2022-03-23 DIAGNOSIS — Z5181 Encounter for therapeutic drug level monitoring: Secondary | ICD-10-CM

## 2022-03-23 DIAGNOSIS — M65331 Trigger finger, right middle finger: Secondary | ICD-10-CM | POA: Diagnosis not present

## 2022-03-23 DIAGNOSIS — Z96652 Presence of left artificial knee joint: Secondary | ICD-10-CM

## 2022-03-23 DIAGNOSIS — Z8669 Personal history of other diseases of the nervous system and sense organs: Secondary | ICD-10-CM

## 2022-03-23 DIAGNOSIS — Z8639 Personal history of other endocrine, nutritional and metabolic disease: Secondary | ICD-10-CM

## 2022-03-23 DIAGNOSIS — M5136 Other intervertebral disc degeneration, lumbar region: Secondary | ICD-10-CM

## 2022-03-23 NOTE — Patient Instructions (Signed)
Standing Labs We placed an order today for your standing lab work.   Please have your standing labs drawn in June and every 3 months   Please have your labs drawn 2 weeks prior to your appointment so that the provider can discuss your lab results at your appointment, if possible.  Please note that you may see your imaging and lab results in MyChart before we have reviewed them. We will contact you once all results are reviewed. Please allow our office up to 72 hours to thoroughly review all of the results before contacting the office for clarification of your results.  WALK-IN LAB HOURS  Monday through Thursday from 8:00 am -12:30 pm and 1:00 pm-5:00 pm and Friday from 8:00 am-12:00 pm.  Patients with office visits requiring labs will be seen before walk-in labs.  You may encounter longer than normal wait times. Please allow additional time. Wait times may be shorter on  Monday and Thursday afternoons.  We do not book appointments for walk-in labs. We appreciate your patience and understanding with our staff.   Labs are drawn by [facility]. Please bring your co-pay at the time of your lab draw.  You may receive a bill from [facility] for your lab work.  Please note if you are on Hydroxychloroquine and and an order has been placed for a Hydroxychloroquine level,  you will need to have it drawn 4 hours or more after your last dose.  If you wish to have your labs drawn at another location, please call the office 24 hours in advance so we can fax the orders.  The office is located at [address], [address], , [place] [address]   If you have any questions regarding directions or hours of operation,  please call [phone].   As a reminder, please drink plenty of water prior to coming for your lab work. Thanks!  

## 2022-03-24 LAB — CBC WITH DIFFERENTIAL/PLATELET
Absolute Monocytes: 413 cells/uL (ref 200–950)
Basophils Absolute: 39 cells/uL (ref 0–200)
Basophils Relative: 0.7 %
Eosinophils Absolute: 149 cells/uL (ref 15–500)
Eosinophils Relative: 2.7 %
HCT: 36.2 % (ref 35.0–45.0)
Hemoglobin: 11.9 g/dL (ref 11.7–15.5)
Lymphs Abs: 1210 cells/uL (ref 850–3900)
MCH: 28.7 pg (ref 27.0–33.0)
MCHC: 32.9 g/dL (ref 32.0–36.0)
MCV: 87.2 fL (ref 80.0–100.0)
MPV: 10.8 fL (ref 7.5–12.5)
Monocytes Relative: 7.5 %
Neutro Abs: 3691 cells/uL (ref 1500–7800)
Neutrophils Relative %: 67.1 %
Platelets: 267 10*3/uL (ref 140–400)
RBC: 4.15 10*6/uL (ref 3.80–5.10)
RDW: 13.5 % (ref 11.0–15.0)
Total Lymphocyte: 22 %
WBC: 5.5 10*3/uL (ref 3.8–10.8)

## 2022-03-24 LAB — COMPLETE METABOLIC PANEL WITH GFR
AG Ratio: 1.4 (calc) (ref 1.0–2.5)
ALT: 12 U/L (ref 6–29)
AST: 20 U/L (ref 10–35)
Albumin: 4.2 g/dL (ref 3.6–5.1)
Alkaline phosphatase (APISO): 105 U/L (ref 37–153)
BUN/Creatinine Ratio: 23 (calc) — ABNORMAL HIGH (ref 6–22)
BUN: 26 mg/dL — ABNORMAL HIGH (ref 7–25)
CO2: 28 mmol/L (ref 20–32)
Calcium: 9.7 mg/dL (ref 8.6–10.4)
Chloride: 104 mmol/L (ref 98–110)
Creat: 1.11 mg/dL — ABNORMAL HIGH (ref 0.60–1.00)
Globulin: 3.1 g/dL (calc) (ref 1.9–3.7)
Glucose, Bld: 82 mg/dL (ref 65–99)
Potassium: 4.7 mmol/L (ref 3.5–5.3)
Sodium: 142 mmol/L (ref 135–146)
Total Bilirubin: 0.3 mg/dL (ref 0.2–1.2)
Total Protein: 7.3 g/dL (ref 6.1–8.1)
eGFR: 52 mL/min/{1.73_m2} — ABNORMAL LOW (ref >=60)

## 2022-03-26 NOTE — Progress Notes (Signed)
CBC WNL.  Creatinine elevated-1.11 and GFR is low-52. Avoid the use of NSAIDs.  Rest of CMP WNL.

## 2022-03-30 ENCOUNTER — Ambulatory Visit: Payer: Medicare PPO | Admitting: Physician Assistant

## 2022-04-03 ENCOUNTER — Other Ambulatory Visit (HOSPITAL_COMMUNITY): Payer: Self-pay

## 2022-04-05 ENCOUNTER — Other Ambulatory Visit (HOSPITAL_COMMUNITY): Payer: Self-pay

## 2022-04-09 ENCOUNTER — Other Ambulatory Visit: Payer: Self-pay | Admitting: Physician Assistant

## 2022-04-09 DIAGNOSIS — Z79899 Other long term (current) drug therapy: Secondary | ICD-10-CM

## 2022-04-09 DIAGNOSIS — Z5181 Encounter for therapeutic drug level monitoring: Secondary | ICD-10-CM

## 2022-04-09 NOTE — Telephone Encounter (Signed)
Last Fill: 02/07/2022  UDS:01/10/2022 UDS is consistent with tramadol use   Narc Agreement: 05/09/2021  Next Visit: 08/20/2022  Last Visit: 03/23/2022  Dx: Rheumatoid arthritis of multiple sites with negative rheumatoid factor   Current Dose per office note on 03/23/2022: Tramadol 50 mg 1 to 2 tablets daily as needed for pain relief.    Okay to refill Tramadol?

## 2022-04-17 ENCOUNTER — Other Ambulatory Visit (HOSPITAL_COMMUNITY): Payer: Self-pay

## 2022-05-08 ENCOUNTER — Other Ambulatory Visit: Payer: Self-pay | Admitting: Physician Assistant

## 2022-05-08 DIAGNOSIS — Z5181 Encounter for therapeutic drug level monitoring: Secondary | ICD-10-CM

## 2022-05-08 DIAGNOSIS — Z79899 Other long term (current) drug therapy: Secondary | ICD-10-CM

## 2022-05-08 NOTE — Telephone Encounter (Signed)
Last Fill: 04/09/2022  UDS:01/10/2022 UDS is consistent with tramadol use.   Narc Agreement: 05/09/2021  Next Visit: 08/20/2022  Last Visit: 03/23/2022  Dx:  Rheumatoid arthritis of multiple sites with negative rheumatoid factor   Current Dose per office note on on 03/23/2022:Tramadol 50 mg 1 to 2 tablets daily as needed for pain relief.    Okay to refill tramadol?

## 2022-05-09 ENCOUNTER — Other Ambulatory Visit (HOSPITAL_COMMUNITY): Payer: Self-pay

## 2022-05-14 ENCOUNTER — Other Ambulatory Visit: Payer: Self-pay

## 2022-05-16 ENCOUNTER — Other Ambulatory Visit (HOSPITAL_COMMUNITY): Payer: Self-pay

## 2022-06-06 ENCOUNTER — Other Ambulatory Visit: Payer: Self-pay | Admitting: Physician Assistant

## 2022-06-06 ENCOUNTER — Other Ambulatory Visit: Payer: Self-pay

## 2022-06-06 ENCOUNTER — Other Ambulatory Visit (HOSPITAL_COMMUNITY): Payer: Self-pay

## 2022-06-06 DIAGNOSIS — M0609 Rheumatoid arthritis without rheumatoid factor, multiple sites: Secondary | ICD-10-CM

## 2022-06-06 MED ORDER — ORENCIA CLICKJECT 125 MG/ML ~~LOC~~ SOAJ
125.0000 mg | SUBCUTANEOUS | 0 refills | Status: DC
Start: 2022-06-06 — End: 2022-09-21
  Filled 2022-06-06: qty 4, 28d supply, fill #0
  Filled 2022-06-28 (×2): qty 4, 28d supply, fill #1
  Filled 2022-08-22: qty 4, 28d supply, fill #2

## 2022-06-06 NOTE — Telephone Encounter (Signed)
Last Fill: 03/06/2022  Labs: 03/23/2022 CBC WNL. Creatinine elevated-1.11 and GFR is low-52. Avoid the use of NSAIDs. Rest of CMP WNL. 03/26/2022 CMP: BUN 28, eGFR 56, alkaline phosphatase 105  TB Gold: 01/10/2022 negative    Next Visit: 08/20/2022  Last Visit: 03/23/2022  WJ:[id number] arthritis of multiple sites with negative rheumatoid factor   Current Dose per office note on 03/23/2022: Orencia 125 mg sq injections every 7 days.   Okay to refill Orencia?

## 2022-06-07 ENCOUNTER — Other Ambulatory Visit: Payer: Self-pay | Admitting: Rheumatology

## 2022-06-07 DIAGNOSIS — Z79899 Other long term (current) drug therapy: Secondary | ICD-10-CM

## 2022-06-07 DIAGNOSIS — Z5181 Encounter for therapeutic drug level monitoring: Secondary | ICD-10-CM

## 2022-06-07 NOTE — Telephone Encounter (Signed)
Last Fill: 05/08/2022  UDS:01/10/2022 UDS is consistent with tramadol use.   Narc Agreement: 05/09/2021  Next Visit: 08/20/2022  Last Visit: 03/23/2022  Dx: Rheumatoid arthritis of multiple sites with negative rheumatoid factor   Current Dose per office note on 03/23/2022:Tramadol 50 mg 1 to 2 tablets daily as needed for pain relief.    Okay to refill Tramadol?

## 2022-06-08 ENCOUNTER — Other Ambulatory Visit: Payer: Self-pay

## 2022-06-08 ENCOUNTER — Other Ambulatory Visit (HOSPITAL_COMMUNITY): Payer: Self-pay

## 2022-06-28 ENCOUNTER — Other Ambulatory Visit (HOSPITAL_COMMUNITY): Payer: Self-pay

## 2022-06-28 ENCOUNTER — Other Ambulatory Visit: Payer: Self-pay

## 2022-07-03 ENCOUNTER — Telehealth: Payer: Self-pay | Admitting: *Deleted

## 2022-07-03 NOTE — Telephone Encounter (Signed)
Patient advised hold Orencia at least one week prior to surgery. She will require clearance from her orthopedic surgeon prior to resuming orencia during the postoperative period. Patient expressed understanding.

## 2022-07-03 NOTE — Telephone Encounter (Signed)
Patient contacted the office stating she is having knee replacement surgery on 07/26/2022. Patient would like to know how long prior to surgery you would for her to hold the Orencia.

## 2022-07-03 NOTE — Telephone Encounter (Signed)
Patient should hold Orencia at least one week prior to surgery.  She will require clearance from her orthopedic surgeon prior to resuming orencia during the postoperative period.

## 2022-07-03 NOTE — Telephone Encounter (Signed)
Attempted to contact the patient and left message for patient to call the office.  

## 2022-07-06 ENCOUNTER — Other Ambulatory Visit: Payer: Self-pay | Admitting: Rheumatology

## 2022-07-06 ENCOUNTER — Other Ambulatory Visit (HOSPITAL_COMMUNITY): Payer: Self-pay

## 2022-07-06 DIAGNOSIS — Z5181 Encounter for therapeutic drug level monitoring: Secondary | ICD-10-CM

## 2022-07-06 DIAGNOSIS — Z79899 Other long term (current) drug therapy: Secondary | ICD-10-CM

## 2022-07-06 NOTE — Telephone Encounter (Signed)
Last Fill: 06/07/2022  UDS:01/10/2022  Narc Agreement: 05/09/2021   Next Visit: 08/20/2022   Last Visit: 03/23/2022   Dx: Rheumatoid arthritis of multiple sites with negative rheumatoid factor   Current Dose per office note on 03/23/2022: Tramadol 50 mg 1 to 2 tablets daily as needed for pain relief.    Okay to refill Tramadol?

## 2022-07-31 ENCOUNTER — Other Ambulatory Visit (HOSPITAL_COMMUNITY): Payer: Self-pay

## 2022-08-02 HISTORY — PX: REPLACEMENT TOTAL KNEE: SUR1224

## 2022-08-06 ENCOUNTER — Other Ambulatory Visit: Payer: Self-pay | Admitting: Rheumatology

## 2022-08-06 DIAGNOSIS — Z5181 Encounter for therapeutic drug level monitoring: Secondary | ICD-10-CM

## 2022-08-06 DIAGNOSIS — Z79899 Other long term (current) drug therapy: Secondary | ICD-10-CM

## 2022-08-06 NOTE — Telephone Encounter (Signed)
Last Fill: 07/06/2022  UDS:01/10/2022 UDS is consistent with tramadol use.   Narc Agreement: 05/09/2021  Next Visit: 08/20/2022  Last Visit: 03/23/2022  Dx: Rheumatoid arthritis of multiple sites with negative rheumatoid factor   Current Dose per office note on 03/23/2022:Tramadol 50 mg 1 to 2 tablets daily as needed for pain relief    Okay to refill tramadol?

## 2022-08-06 NOTE — Progress Notes (Deleted)
Office Visit Note  Patient: [name]             Date of Birth: 09-22-1945           MRN: [id number]             PCP: Amil Amen, MD Referring: Amil Amen, MD Visit Date: 08/20/2022 Occupation: @GUAROCC @  Subjective:  No chief complaint on file.   History of Present Illness: [name] is a 77 y.o. female ***     Activities of Daily Living:  Patient reports morning stiffness for *** {minute/hour:19697}.   Patient {ACTIONS;DENIES/REPORTS:21021675::"Denies"} nocturnal pain.  Difficulty dressing/grooming: {ACTIONS;DENIES/REPORTS:21021675::"Denies"} Difficulty climbing stairs: {ACTIONS;DENIES/REPORTS:21021675::"Denies"} Difficulty getting out of chair: {ACTIONS;DENIES/REPORTS:21021675::"Denies"} Difficulty using hands for taps, buttons, cutlery, and/or writing: {ACTIONS;DENIES/REPORTS:21021675::"Denies"}  No Rheumatology ROS completed.   PMFS History:  Patient Active Problem List   Diagnosis Date Noted   Unilateral primary osteoarthritis, right knee 11/18/2018   Closed fracture of shaft of metatarsal bone 07/30/2016   Metatarsal fracture 07/30/2016   Displaced fracture of second metatarsal bone, right foot, initial encounter for closed fracture 07/26/2016   Arthritis of shoulder 04/10/2016   High risk medication use 03/16/2016   Primary osteoarthritis of both feet 03/16/2016   Primary osteoarthritis of both knees 03/16/2016   DDD (degenerative disc disease), lumbar 03/16/2016   OSA on CPAP 07/27/2015   Lip numbness 03/19/2015   TIA (transient ischemic attack) 03/19/2015   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HCC) 02/06/2015   Post op infection 02/05/2015   History of lumbar laminectomy for spinal cord decompression 01/17/2015   Lumbar spinal stenosis 01/17/2015   Primary osteoarthritis of left knee 03/25/2014   Rheumatoid arthritis of multiple sites with negative rheumatoid factor (HCC) 03/25/2014   History of total knee replacement, left  03/25/2014   DERMATITIS 09/28/2008   CONSTIPATION 05/25/2008   DYSPNEA ON EXERTION 05/25/2008   ANEMIA, NORMOCYTIC 04/13/2008   ANKLE PAIN, BILATERAL 11/25/2007   HLD (hyperlipidemia) 08/14/2006   Morbid (severe) obesity due to excess calories (HCC) 12/13/2005   CARPAL TUNNEL SYNDROME 12/13/2005   PERIPHERAL NEUROPATHY 12/13/2005   Essential hypertension 12/13/2005   DYSRHYTHMIA, CARDIAC NOS 12/13/2005    Past Medical History:  Diagnosis Date   Anemia    Arthritis    Rheumatoid, followed by Dr. Corliss Skains, knees & ankles    Cancer (HCC)    CHF (congestive heart failure) (HCC)    pt not aware of this   Constipation    GERD (gastroesophageal reflux disease)    H/O cardiovascular stress test 02/2014   seen by Dr. Jacinto Halim - told that she was cleared for surgery   Heart murmur    states she's never had any problems   Hypertension    Leg cramps    Shortness of breath dyspnea    with exertion   Sleep apnea    CPAP in use- QO night, study done at [facility]- 2007    Stroke [facility]) 03/2015   TIA    Family History  Problem Relation Age of Onset   Seizures Mother    Transient ischemic attack Mother    Hypertension Mother    Diabetes Mother    Congestive Heart Failure Mother    Arthritis/Rheumatoid Mother    Parkinson's disease Mother    Dementia Mother    Heart disease Father        CHF   Stroke Sister    Depression Sister    Arthritis Brother    Arthritis Brother  Arthritis Son    Hypertension Son    Past Surgical History:  Procedure Laterality Date   ABDOMINAL HYSTERECTOMY     CARPAL TUNNEL RELEASE Right    COLONOSCOPY     COLONOSCOPY WITH PROPOFOL N/A 11/13/2019   Procedure: COLONOSCOPY WITH PROPOFOL;  Surgeon: Jeani Hawking, MD;  Location: [facility];  Service: Endoscopy;  Laterality: N/A;   ESOPHAGOGASTRODUODENOSCOPY (EGD) WITH PROPOFOL N/A 11/13/2019   Procedure: ESOPHAGOGASTRODUODENOSCOPY (EGD) WITH PROPOFOL;  Surgeon: Jeani Hawking, MD;  Location: [facility];   Service: Endoscopy;  Laterality: N/A;   FOOT SURGERY Bilateral    bunionectomy, calluses , also has several pins & screws   HAND SURGERY     JOINT REPLACEMENT     planned for 03/25/2014   KNEE ARTHROPLASTY     KNEE SURGERY  1990's    LUMBAR LAMINECTOMY/DECOMPRESSION MICRODISCECTOMY N/A 01/17/2015   Procedure: L2-3, L3-4 Decompression;  Surgeon: Eldred Manges, MD;  Location: [facility];  Service: Orthopedics;  Laterality: N/A;   LUMBAR LAMINECTOMY/DECOMPRESSION MICRODISCECTOMY N/A 02/06/2015   Procedure: I AND D LUMBAR WITH WOUND VAC PLACEMENT;  Surgeon: Eldred Manges, MD;  Location: [facility] OR;  Service: Orthopedics;  Laterality: N/A;   MASTECTOMY Right 02/01/2020   ORIF TOE FRACTURE Right 07/30/2016   Procedure: OPEN REDUCTION INTERNAL FIXATION (ORIF) RIGHT 2ND METATARSAL (TOE) FRACTURE;  Surgeon: Eldred Manges, MD;  Location: [facility];  Service: Orthopedics;  Laterality: Right;   POLYPECTOMY  11/13/2019   Procedure: POLYPECTOMY;  Surgeon: Jeani Hawking, MD;  [facility]: [facility];  Service: Endoscopy;;   TOTAL KNEE ARTHROPLASTY Left 03/25/2014   Procedure: TOTAL KNEE ARTHROPLASTY;  Surgeon: Valeria Batman, MD;  Location: [facility] OR;  Service: Orthopedics;  Laterality: Left;   TUBAL LIGATION     Social History   Social History Narrative   Lives with husband.  2 Sons.  Using walker.    Caffeine  Less then 1 cups daily.   Retired.     Immunization History  Administered Date(s) Administered   H1N1 12/23/2007   Influenza Split 11/03/2014   Influenza Whole 12/19/2005, 11/13/2006   Influenza,inj,Quad PF,6+ Mos 09/16/2017   Influenza-Unspecified 11/03/2014   PFIZER Comirnaty(Gray Top)Covid-19 Tri-Sucrose Vaccine 01/28/2019, 02/18/2019, 10/07/2019   PFIZER(Purple Top)SARS-COV-2 Vaccination 01/28/2019, 02/18/2019   Pneumococcal Polysaccharide-23 12/19/2005   Tdap 12/20/2011     Objective: Vital Signs: There were no vitals taken for this visit.   Physical Exam   Musculoskeletal Exam: ***  CDAI  Exam: CDAI Score: -- Patient Global: --; Provider Global: -- Swollen: --; Tender: -- Joint Exam 08/20/2022   No joint exam has been documented for this visit   There is currently no information documented on the homunculus. Go to the Rheumatology activity and complete the homunculus joint exam.  Investigation: No additional findings.  Imaging: No results found.  Recent Labs: Lab Results  Component Value Date   WBC 5.5 03/23/2022   HGB 11.9 03/23/2022   PLT 267 03/23/2022   NA 142 03/23/2022   K 4.7 03/23/2022   CL 104 03/23/2022   CO2 28 03/23/2022   GLUCOSE 82 03/23/2022   BUN 26 (H) 03/23/2022   CREATININE 1.11 (H) 03/23/2022   BILITOT 0.3 03/23/2022   ALKPHOS 157 (H) 01/10/2022   AST 20 03/23/2022   ALT 12 03/23/2022   PROT 7.3 03/23/2022   ALBUMIN 4.3 01/10/2022   CALCIUM 9.7 03/23/2022   GFRAA 62 11/16/2019   QFTBGOLDPLUS Negative 01/10/2022    Speciality Comments: Narcotic Agreement 10/30/16 Humira stopped 01/18/20  d/t chemo/radiation Orencia started 09/15/20  Procedures:  No procedures performed Allergies: Ramipril   Assessment / Plan:     Visit Diagnoses: Rheumatoid arthritis of multiple sites with negative rheumatoid factor (HCC)  High risk medication use  Therapeutic drug monitoring  Trigger finger, right middle finger  History of total knee replacement, left  Unilateral primary osteoarthritis, right knee  Primary osteoarthritis of both feet  DDD (degenerative disc disease), lumbar  History of hypertension  History of hyperlipidemia  History of TIA (transient ischemic attack)  Invasive ductal carcinoma of right breast (HCC)  History of sleep apnea  Orders: No orders of the defined types were placed in this encounter.  No orders of the defined types were placed in this encounter.   Face-to-face time spent with patient was *** minutes. Greater than 50% of time was spent in counseling and coordination of care.  Follow-Up  Instructions: No follow-ups on file.   Gearldine Bienenstock, PA-C  Note - This record has been created using Dragon software.  Chart creation errors have been sought, but may not always  have been located. Such creation errors do not reflect on  the standard of medical care.

## 2022-08-08 ENCOUNTER — Telehealth: Payer: Self-pay | Admitting: *Deleted

## 2022-08-08 NOTE — Telephone Encounter (Signed)
Patient will require clearance from her surgeon prior to resuming orencia

## 2022-08-08 NOTE — Telephone Encounter (Signed)
I called patient, patient verbalized understanding. 

## 2022-08-08 NOTE — Telephone Encounter (Signed)
Patient had a total knee replacement surgery 08/02/2022, patient wants to know when she can restart Orencia.

## 2022-08-13 ENCOUNTER — Ambulatory Visit: Payer: Medicare PPO

## 2022-08-13 ENCOUNTER — Telehealth: Payer: Self-pay | Admitting: Rheumatology

## 2022-08-13 NOTE — Telephone Encounter (Signed)
Attempted to contact the patient and left message to advise patient she may postpone her appointment until she has clearance from her surgeon.

## 2022-08-13 NOTE — Telephone Encounter (Signed)
Patient called to cancel her appointment scheduled for 08/20/22.  Patient had knee surgery on 08/02/22 and will call back to reschedule when she gets clearance from her surgeon.  Patient states if Dr. Imelda Pillow need to see her sooner she can do a virtual appointment.

## 2022-08-13 NOTE — Telephone Encounter (Signed)
Ok to postpone appointment.

## 2022-08-20 ENCOUNTER — Ambulatory Visit: Payer: Medicare PPO | Admitting: Physician Assistant

## 2022-08-20 ENCOUNTER — Other Ambulatory Visit: Payer: Self-pay

## 2022-08-20 ENCOUNTER — Ambulatory Visit: Payer: Medicare PPO | Attending: Orthopedic Surgery

## 2022-08-20 DIAGNOSIS — M65331 Trigger finger, right middle finger: Secondary | ICD-10-CM

## 2022-08-20 DIAGNOSIS — M1711 Unilateral primary osteoarthritis, right knee: Secondary | ICD-10-CM

## 2022-08-20 DIAGNOSIS — Z8639 Personal history of other endocrine, nutritional and metabolic disease: Secondary | ICD-10-CM

## 2022-08-20 DIAGNOSIS — M25661 Stiffness of right knee, not elsewhere classified: Secondary | ICD-10-CM | POA: Insufficient documentation

## 2022-08-20 DIAGNOSIS — Z8669 Personal history of other diseases of the nervous system and sense organs: Secondary | ICD-10-CM

## 2022-08-20 DIAGNOSIS — C50911 Malignant neoplasm of unspecified site of right female breast: Secondary | ICD-10-CM

## 2022-08-20 DIAGNOSIS — R600 Localized edema: Secondary | ICD-10-CM | POA: Diagnosis present

## 2022-08-20 DIAGNOSIS — M25561 Pain in right knee: Secondary | ICD-10-CM | POA: Insufficient documentation

## 2022-08-20 DIAGNOSIS — M19071 Primary osteoarthritis, right ankle and foot: Secondary | ICD-10-CM

## 2022-08-20 DIAGNOSIS — Z8673 Personal history of transient ischemic attack (TIA), and cerebral infarction without residual deficits: Secondary | ICD-10-CM

## 2022-08-20 DIAGNOSIS — M5136 Other intervertebral disc degeneration, lumbar region: Secondary | ICD-10-CM

## 2022-08-20 DIAGNOSIS — Z79899 Other long term (current) drug therapy: Secondary | ICD-10-CM

## 2022-08-20 DIAGNOSIS — Z8679 Personal history of other diseases of the circulatory system: Secondary | ICD-10-CM

## 2022-08-20 DIAGNOSIS — Z5181 Encounter for therapeutic drug level monitoring: Secondary | ICD-10-CM

## 2022-08-20 DIAGNOSIS — Z96652 Presence of left artificial knee joint: Secondary | ICD-10-CM

## 2022-08-20 DIAGNOSIS — M0609 Rheumatoid arthritis without rheumatoid factor, multiple sites: Secondary | ICD-10-CM

## 2022-08-20 NOTE — Therapy (Signed)
OUTPATIENT PHYSICAL THERAPY LOWER EXTREMITY EVALUATION   Patient Name: [name] MRN: [id number] DOB:May 09, 1945, 77 y.o., female Today's Date: 08/20/2022  END OF SESSION:  PT End of Session - 08/20/22 1017     Visit Number 1    Number of Visits 12    Date for PT Re-Evaluation 11/02/22    PT Start Time 1018    PT Stop Time 1052    PT Time Calculation (min) 34 min    Activity Tolerance Patient tolerated treatment well    Behavior During Therapy WFL for tasks assessed/performed             Past Medical History:  Diagnosis Date   Anemia    Arthritis    Rheumatoid, followed by Dr. Corliss Skains, knees & ankles    Cancer (HCC)    CHF (congestive heart failure) (HCC)    pt not aware of this   Constipation    GERD (gastroesophageal reflux disease)    H/O cardiovascular stress test 02/2014   seen by Dr. Jacinto Halim - told that she was cleared for surgery   Heart murmur    states she's never had any problems   Hypertension    Leg cramps    Shortness of breath dyspnea    with exertion   Sleep apnea    CPAP in use- QO night, study done at [facility]- 2007    Stroke [facility]) 03/2015   TIA   Past Surgical History:  Procedure Laterality Date   ABDOMINAL HYSTERECTOMY     CARPAL TUNNEL RELEASE Right    COLONOSCOPY     COLONOSCOPY WITH PROPOFOL N/A 11/13/2019   Procedure: COLONOSCOPY WITH PROPOFOL;  Surgeon: Jeani Hawking, MD;  [facility];  Service: Endoscopy;  Laterality: N/A;   ESOPHAGOGASTRODUODENOSCOPY (EGD) WITH PROPOFOL N/A 11/13/2019   Procedure: ESOPHAGOGASTRODUODENOSCOPY (EGD) WITH PROPOFOL;  Surgeon: Jeani Hawking, MD;  [facility];  Service: Endoscopy;  Laterality: N/A;   FOOT SURGERY Bilateral    bunionectomy, calluses , also has several pins & screws   HAND SURGERY     JOINT REPLACEMENT     planned for 03/25/2014   KNEE ARTHROPLASTY     KNEE SURGERY  1990's    LUMBAR LAMINECTOMY/DECOMPRESSION MICRODISCECTOMY N/A 01/17/2015   Procedure: L2-3, L3-4  Decompression;  Surgeon: Eldred Manges, MD;  Location: [facility];  Service: Orthopedics;  Laterality: N/A;   LUMBAR LAMINECTOMY/DECOMPRESSION MICRODISCECTOMY N/A 02/06/2015   Procedure: I AND D LUMBAR WITH WOUND VAC PLACEMENT;  Surgeon: Eldred Manges, MD;  Location: [facility];  Service: Orthopedics;  Laterality: N/A;   MASTECTOMY Right 02/01/2020   ORIF TOE FRACTURE Right 07/30/2016   Procedure: OPEN REDUCTION INTERNAL FIXATION (ORIF) RIGHT 2ND METATARSAL (TOE) FRACTURE;  Surgeon: Eldred Manges, MD;  Location: [facility];  Service: Orthopedics;  Laterality: Right;   POLYPECTOMY  11/13/2019   Procedure: POLYPECTOMY;  Surgeon: Jeani Hawking, MD;  [facility];  Service: Endoscopy;;   TOTAL KNEE ARTHROPLASTY Left 03/25/2014   Procedure: TOTAL KNEE ARTHROPLASTY;  Surgeon: Valeria Batman, MD;  Location: [facility] OR;  Service: Orthopedics;  Laterality: Left;   TUBAL LIGATION     Patient Active Problem List   Diagnosis Date Noted   Unilateral primary osteoarthritis, right knee 11/18/2018   Closed fracture of shaft of metatarsal bone 07/30/2016   Metatarsal fracture 07/30/2016   Displaced fracture of second metatarsal bone, right foot, initial encounter for closed fracture 07/26/2016   Arthritis of shoulder 04/10/2016   High risk medication  use 03/16/2016   Primary osteoarthritis of both feet 03/16/2016   Primary osteoarthritis of both knees 03/16/2016   DDD (degenerative disc disease), lumbar 03/16/2016   OSA on CPAP 07/27/2015   Lip numbness 03/19/2015   TIA (transient ischemic attack) 03/19/2015   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HCC) 02/06/2015   Post op infection 02/05/2015   History of lumbar laminectomy for spinal cord decompression 01/17/2015   Lumbar spinal stenosis 01/17/2015   Primary osteoarthritis of left knee 03/25/2014   Rheumatoid arthritis of multiple sites with negative rheumatoid factor (HCC) 03/25/2014   History of total knee replacement, left 03/25/2014   DERMATITIS 09/28/2008    CONSTIPATION 05/25/2008   DYSPNEA ON EXERTION 05/25/2008   ANEMIA, NORMOCYTIC 04/13/2008   ANKLE PAIN, BILATERAL 11/25/2007   HLD (hyperlipidemia) 08/14/2006   Morbid (severe) obesity due to excess calories (HCC) 12/13/2005   CARPAL TUNNEL SYNDROME 12/13/2005   PERIPHERAL NEUROPATHY 12/13/2005   Essential hypertension 12/13/2005   DYSRHYTHMIA, CARDIAC NOS 12/13/2005    PCP: Amil Amen, MD  REFERRING PROVIDER: Griffin Dakin, MD   REFERRING DIAG: Unilateral primary osteoarthritis, right knee; Aftercare following joint replacement surgery; Presence of right artificial knee joint   THERAPY DIAG:  Stiffness of right knee, not elsewhere classified  Acute pain of right knee  Localized edema  Rationale for Evaluation and Treatment: Rehabilitation  ONSET DATE: 08/02/22  SUBJECTIVE:   SUBJECTIVE STATEMENT: Patient reports that she had a right total knee replacement on 08/02/22. She notes that her knee has been coming along nicely since surgery. She had home health therapy for 2 weeks after surgery. She was told that she was progressing nicely.   PERTINENT HISTORY: Hypertension, history of a TIA, peripheral neuropathy, osteoarthritis, rheumatoid arthritis, chronic low back pain, and history of cancer PAIN:  Are you having pain? Yes: NPRS scale: 5-6/10 Pain location: anterior right knee Pain description: nagging, throbbing, and sore Aggravating factors: moving  Relieving factors: ice and medication   PRECAUTIONS: Fall  RED FLAGS: None   WEIGHT BEARING RESTRICTIONS: No  FALLS:  Has patient fallen in last 6 months? Yes. Number of falls 1; she was bending over to pick up something off her bathroom floor when she slipped and fell ([facility] Day 2024)  LIVING ENVIRONMENT: Lives with: lives with their family Lives in: House/apartment Stairs: Yes: External: 2 steps; on left going up; step to pattern (up with the good and down with the bad) Has following  equipment at home: Single point cane, Walker - 2 wheeled, and Environmental consultant - 4 wheeled  OCCUPATION: retired  PLOF: Independent  PATIENT GOALS: be able to walk with her cane, and improved knee mobility  NEXT MD VISIT: 09/18/22  OBJECTIVE:   PATIENT SURVEYS:  FOTO 45.26  COGNITION: Overall cognitive status: Within functional limits for tasks assessed     SENSATION: Patient reports no numbness or tingling  EDEMA:  Circumferential: Right tibiofemoral joint line: 54 cm Left tibiofemoral joint line: 45 cm  PALPATION: TTP: right quadriceps, hip adductors, and gastrocnemius/soleus  LOWER EXTREMITY ROM:  Active ROM Right eval Left eval  Hip flexion    Hip extension    Hip abduction    Hip adduction    Hip internal rotation    Hip external rotation    Knee flexion 81/ 84 (PROM) 118  Knee extension 1 0  Ankle dorsiflexion    Ankle plantarflexion    Ankle inversion    Ankle eversion     (Blank rows = not tested)  LOWER EXTREMITY MMT: not tested due to surgical condition  LOWER EXTREMITY SPECIAL TESTS:  Not tested due to surgical condition  GAIT: Assistive device utilized: Walker - 2 wheeled Level of assistance: Modified independence Comments: decreased gait speed with step through pattern and decreased stride length    TODAY'S TREATMENT:                                                                                                                              DATE:     PATIENT EDUCATION:  Education details: reviewed HH HEP, POC, prognosis, healing, edema, objective findings, and goals for therapy Person educated: Patient Education method: Explanation Education comprehension: verbalized understanding  HOME EXERCISE PROGRAM:   ASSESSMENT:  CLINICAL IMPRESSION: Patient is a 77 y.o. female who was seen today for physical therapy evaluation and treatment following a right knee arthroplasty on 08/02/22. She presented with moderate pain severity and irritability with  right knee flexion being the most aggravating to her familiar pain. She exhibited increased right knee edema compared to the left knee. However, she exhibited no signs or symptoms of a DVT or other postoperative complication. Her home health HEP was reviewed and she was encouraged to continue with these interventions. Recommend that she continue with skilled physical therapy to address her impairments to return to her prior level of function.   OBJECTIVE IMPAIRMENTS: Abnormal gait, decreased activity tolerance, decreased mobility, difficulty walking, decreased ROM, decreased strength, increased edema, impaired tone, and pain.   ACTIVITY LIMITATIONS: carrying, lifting, standing, squatting, stairs, and locomotion level  PARTICIPATION LIMITATIONS: meal prep, cleaning, laundry, shopping, and community activity  PERSONAL FACTORS: 3+ comorbidities: Hypertension, history of a TIA, peripheral neuropathy, osteoarthritis, rheumatoid arthritis, chronic low back pain, and history of cancer  are also affecting patient's functional outcome.   REHAB POTENTIAL: Good  CLINICAL DECISION MAKING: Stable/uncomplicated  EVALUATION COMPLEXITY: Low   GOALS: Goals reviewed with patient? Yes  SHORT TERM GOALS: Target date: 09/10/22 Patient will be independent with her initial HEP.  Baseline: Goal status: INITIAL  2.  Patient will be able to demonstrate at least 95 degrees of active right knee flexion for improve knee mobility.  Baseline:  Goal status: INITIAL  LONG TERM GOALS: Target date: 10/01/22  Patient will be independent with her advanced HEP.  Baseline:  Goal status: INITIAL  2.  Patient will be able to demonstrate at least 115 degrees of right knee flexion for improved function navigating stairs.  Baseline:  Goal status: INITIAL  3.  Patient will be able to navigate at least 80 feet with a single point cane for improved household mobility.  Baseline:  Goal status: INITIAL  4.  Patient will be  able to navigate at least 2 steps with a reciprocal pattern for improved household mobility.  Baseline:  Goal status: INITIAL  PLAN:  PT FREQUENCY: 2x/week  PT DURATION: 6 weeks  PLANNED INTERVENTIONS: Therapeutic exercises, Therapeutic activity, Neuromuscular re-education, Balance training, Gait training,  Patient/Family education, Self Care, Joint mobilization, Stair training, Electrical stimulation, Cryotherapy, Moist heat, Vasopneumatic device, Manual therapy, and Re-evaluation  PLAN FOR NEXT SESSION: Nustep, right knee AROM and PROM (focus on knee flexion), manual therapy, gait training, and modalities as needed   Granville Lewis, PT 08/20/2022, 4:46 PM

## 2022-08-22 ENCOUNTER — Ambulatory Visit: Payer: Medicare PPO | Admitting: Physical Therapy

## 2022-08-22 ENCOUNTER — Other Ambulatory Visit (HOSPITAL_COMMUNITY): Payer: Self-pay

## 2022-08-22 ENCOUNTER — Encounter: Payer: Self-pay | Admitting: Physical Therapy

## 2022-08-22 DIAGNOSIS — R600 Localized edema: Secondary | ICD-10-CM

## 2022-08-22 DIAGNOSIS — M25661 Stiffness of right knee, not elsewhere classified: Secondary | ICD-10-CM

## 2022-08-22 DIAGNOSIS — M25561 Pain in right knee: Secondary | ICD-10-CM

## 2022-08-22 NOTE — Therapy (Signed)
OUTPATIENT PHYSICAL THERAPY LOWER EXTREMITY TREATMENT   Patient Name: [name] MRN: [id number] DOB:10/23/1945, 77 y.o., female Today's Date: 08/22/2022  END OF SESSION:  PT End of Session - 08/22/22 0930     Visit Number 2    Number of Visits 12    Date for PT Re-Evaluation 11/02/22    PT Start Time 0932    PT Stop Time 1012    PT Time Calculation (min) 40 min    Equipment Utilized During Treatment Other (comment)   FWW   Activity Tolerance Patient tolerated treatment well    Behavior During Therapy WFL for tasks assessed/performed             Past Medical History:  Diagnosis Date   Anemia    Arthritis    Rheumatoid, followed by Dr. Corliss Skains, knees & ankles    Cancer (HCC)    CHF (congestive heart failure) (HCC)    pt not aware of this   Constipation    GERD (gastroesophageal reflux disease)    H/O cardiovascular stress test 02/2014   seen by Dr. Jacinto Halim - told that she was cleared for surgery   Heart murmur    states she's never had any problems   Hypertension    Leg cramps    Shortness of breath dyspnea    with exertion   Sleep apnea    CPAP in use- QO night, study done at [facility]- 2007    Stroke [facility]) 03/2015   TIA   Past Surgical History:  Procedure Laterality Date   ABDOMINAL HYSTERECTOMY     CARPAL TUNNEL RELEASE Right    COLONOSCOPY     COLONOSCOPY WITH PROPOFOL N/A 11/13/2019   Procedure: COLONOSCOPY WITH PROPOFOL;  Surgeon: Jeani Hawking, MD;  Location: [facility];  Service: Endoscopy;  Laterality: N/A;   ESOPHAGOGASTRODUODENOSCOPY (EGD) WITH PROPOFOL N/A 11/13/2019   Procedure: ESOPHAGOGASTRODUODENOSCOPY (EGD) WITH PROPOFOL;  Surgeon: Jeani Hawking, MD;  Location: [facility];  Service: Endoscopy;  Laterality: N/A;   FOOT SURGERY Bilateral    bunionectomy, calluses , also has several pins & screws   HAND SURGERY     JOINT REPLACEMENT     planned for 03/25/2014   KNEE ARTHROPLASTY     KNEE SURGERY  1990's    LUMBAR  LAMINECTOMY/DECOMPRESSION MICRODISCECTOMY N/A 01/17/2015   Procedure: L2-3, L3-4 Decompression;  Surgeon: Eldred Manges, MD;  Location: [facility];  Service: Orthopedics;  Laterality: N/A;   LUMBAR LAMINECTOMY/DECOMPRESSION MICRODISCECTOMY N/A 02/06/2015   Procedure: I AND D LUMBAR WITH WOUND VAC PLACEMENT;  Surgeon: Eldred Manges, MD;  Location: [facility] OR;  Service: Orthopedics;  Laterality: N/A;   MASTECTOMY Right 02/01/2020   ORIF TOE FRACTURE Right 07/30/2016   Procedure: OPEN REDUCTION INTERNAL FIXATION (ORIF) RIGHT 2ND METATARSAL (TOE) FRACTURE;  Surgeon: Eldred Manges, MD;  Location: [facility];  Service: Orthopedics;  Laterality: Right;   POLYPECTOMY  11/13/2019   Procedure: POLYPECTOMY;  Surgeon: Jeani Hawking, MD;  [facility]: [facility];  Service: Endoscopy;;   TOTAL KNEE ARTHROPLASTY Left 03/25/2014   Procedure: TOTAL KNEE ARTHROPLASTY;  Surgeon: Valeria Batman, MD;  Location: [facility] OR;  Service: Orthopedics;  Laterality: Left;   TUBAL LIGATION     Patient Active Problem List   Diagnosis Date Noted   Unilateral primary osteoarthritis, right knee 11/18/2018   Closed fracture of shaft of metatarsal bone 07/30/2016   Metatarsal fracture 07/30/2016   Displaced fracture of second metatarsal bone, right foot, initial encounter for closed fracture 07/26/2016  Arthritis of shoulder 04/10/2016   High risk medication use 03/16/2016   Primary osteoarthritis of both feet 03/16/2016   Primary osteoarthritis of both knees 03/16/2016   DDD (degenerative disc disease), lumbar 03/16/2016   OSA on CPAP 07/27/2015   Lip numbness 03/19/2015   TIA (transient ischemic attack) 03/19/2015   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HCC) 02/06/2015   Post op infection 02/05/2015   History of lumbar laminectomy for spinal cord decompression 01/17/2015   Lumbar spinal stenosis 01/17/2015   Primary osteoarthritis of left knee 03/25/2014   Rheumatoid arthritis of multiple sites with negative rheumatoid factor (HCC) 03/25/2014    History of total knee replacement, left 03/25/2014   DERMATITIS 09/28/2008   CONSTIPATION 05/25/2008   DYSPNEA ON EXERTION 05/25/2008   ANEMIA, NORMOCYTIC 04/13/2008   ANKLE PAIN, BILATERAL 11/25/2007   HLD (hyperlipidemia) 08/14/2006   Morbid (severe) obesity due to excess calories (HCC) 12/13/2005   CARPAL TUNNEL SYNDROME 12/13/2005   PERIPHERAL NEUROPATHY 12/13/2005   Essential hypertension 12/13/2005   DYSRHYTHMIA, CARDIAC NOS 12/13/2005    PCP: Amil Amen, MD  REFERRING PROVIDER: Griffin Dakin, MD   REFERRING DIAG: Unilateral primary osteoarthritis, right knee; Aftercare following joint replacement surgery; Presence of right artificial knee joint   THERAPY DIAG:  Stiffness of right knee, not elsewhere classified  Acute pain of right knee  Localized edema  Rationale for Evaluation and Treatment: Rehabilitation  ONSET DATE: 08/02/22  SUBJECTIVE:   SUBJECTIVE STATEMENT: Reports that she has minimal pain. Does have a very small amount of stairs to maneuver at home but her son likes to be there for stairs due to her balance.  PERTINENT HISTORY: Hypertension, history of a TIA, peripheral neuropathy, osteoarthritis, rheumatoid arthritis, chronic low back pain, and history of cancer  PAIN:  Are you having pain? Yes: NPRS scale: 2/10 Pain location: anterior right knee Pain description: nagging, throbbing, and sore Aggravating factors: moving  Relieving factors: ice and medication   PRECAUTIONS: Fall  WEIGHT BEARING RESTRICTIONS: No  FALLS:  Has patient fallen in last 6 months? Yes. Number of falls 1; she was bending over to pick up something off her bathroom floor when she slipped and fell ([facility] 2024)  PATIENT GOALS: be able to walk with her cane, and improved knee mobility  NEXT MD VISIT: 09/18/22  OBJECTIVE:   PATIENT SURVEYS:  FOTO 45.26  COGNITION: Overall cognitive status: Within functional limits for tasks  assessed     SENSATION: Patient reports no numbness or tingling  EDEMA:  Circumferential: Right tibiofemoral joint line: 54 cm Left tibiofemoral joint line: 45 cm  PALPATION: TTP: right quadriceps, hip adductors, and gastrocnemius/soleus  LOWER EXTREMITY ROM:  Active ROM Right eval Left eval Right AROM 08/22/22  Hip flexion     Hip extension     Hip abduction     Hip adduction     Hip internal rotation     Hip external rotation     Knee flexion 81/ 84 (PROM) 118 90  Knee extension 1 0 0  Ankle dorsiflexion     Ankle plantarflexion     Ankle inversion     Ankle eversion      (Blank rows = not tested)  LOWER EXTREMITY MMT: not tested due to surgical condition  LOWER EXTREMITY SPECIAL TESTS:  Not tested due to surgical condition  GAIT: Assistive device utilized: Walker - 2 wheeled Level of assistance: Modified independence Comments: decreased gait speed with step through pattern and decreased stride length  TODAY'S TREATMENT:                                                                                                                              DATE:08/22/22  EXERCISE LOG  Exercise Repetitions and Resistance Comments  Nustep L2, seat 6 x15 min   Seated heel slides X30 reps   LAQ AROM x30 reps   Lunges 6" step x20 reps   Hip flexion RLE AROM x20 reps   Hip abduction RLE AROM x20 reps   HS curl  Standing x20 reps    Blank cell = exercise not performed today     Modalities  Date: 08/22/22 Vaso: Knee, Low, 10 mins, Pain and Edema  PATIENT EDUCATION:  Education details: reviewed HH HEP, POC, prognosis, healing, edema, objective findings, and goals for therapy Person educated: Patient Education method: Explanation Education comprehension: verbalized understanding  HOME EXERCISE PROGRAM:  ASSESSMENT:  CLINICAL IMPRESSION: Patient presented in clinic with FWW for gait. Patient does have stairs at home but her son supervises her mostly due to her imbalance  but using as little steps as possible currently. Patient able to tolerate all therex fairly well other than discomfort with knee flexion exercises. AROM improved of R knee to 0-90 deg. Normal vasopneumatic response noted following removal of the modality.  OBJECTIVE IMPAIRMENTS: Abnormal gait, decreased activity tolerance, decreased mobility, difficulty walking, decreased ROM, decreased strength, increased edema, impaired tone, and pain.   ACTIVITY LIMITATIONS: carrying, lifting, standing, squatting, stairs, and locomotion level  PARTICIPATION LIMITATIONS: meal prep, cleaning, laundry, shopping, and community activity  PERSONAL FACTORS: 3+ comorbidities: Hypertension, history of a TIA, peripheral neuropathy, osteoarthritis, rheumatoid arthritis, chronic low back pain, and history of cancer  are also affecting patient's functional outcome.   REHAB POTENTIAL: Good  CLINICAL DECISION MAKING: Stable/uncomplicated  EVALUATION COMPLEXITY: Low  GOALS: Goals reviewed with patient? Yes  SHORT TERM GOALS: Target date: 09/10/22 Patient will be independent with her initial HEP.  Baseline: Goal status: INITIAL  2.  Patient will be able to demonstrate at least 95 degrees of active right knee flexion for improve knee mobility.  Baseline:  Goal status: INITIAL  LONG TERM GOALS: Target date: 10/01/22  Patient will be independent with her advanced HEP.  Baseline:  Goal status: INITIAL  2.  Patient will be able to demonstrate at least 115 degrees of right knee flexion for improved function navigating stairs.  Baseline:  Goal status: INITIAL  3.  Patient will be able to navigate at least 80 feet with a single point cane for improved household mobility.  Baseline:  Goal status: INITIAL  4.  Patient will be able to navigate at least 2 steps with a reciprocal pattern for improved household mobility.  Baseline:  Goal status: INITIAL  PLAN:  PT FREQUENCY: 2x/week  PT DURATION: 6  weeks  PLANNED INTERVENTIONS: Therapeutic exercises, Therapeutic activity, Neuromuscular re-education, Balance training, Gait training, Patient/Family education, Self Care, Joint mobilization, Stair training, Lobbyist  stimulation, Cryotherapy, Moist heat, Vasopneumatic device, Manual therapy, and Re-evaluation  PLAN FOR NEXT SESSION: Nustep, right knee AROM and PROM (focus on knee flexion), manual therapy, gait training, and modalities as needed  Marvell Fuller, PTA 08/22/2022, 10:15 AM

## 2022-08-28 ENCOUNTER — Ambulatory Visit: Payer: Medicare PPO | Admitting: Physical Therapy

## 2022-08-28 ENCOUNTER — Encounter: Payer: Self-pay | Admitting: Physical Therapy

## 2022-08-28 DIAGNOSIS — R600 Localized edema: Secondary | ICD-10-CM

## 2022-08-28 DIAGNOSIS — M25561 Pain in right knee: Secondary | ICD-10-CM

## 2022-08-28 DIAGNOSIS — M25661 Stiffness of right knee, not elsewhere classified: Secondary | ICD-10-CM | POA: Diagnosis not present

## 2022-08-28 NOTE — Therapy (Signed)
OUTPATIENT PHYSICAL THERAPY LOWER EXTREMITY TREATMENT   Patient Name: [name] MRN: [id number] DOB:April 29, 1945, 77 y.o., female Today's Date: 08/28/2022  END OF SESSION:  PT End of Session - 08/28/22 0937     Visit Number 3    Number of Visits 12    Date for PT Re-Evaluation 11/02/22    PT Start Time 0930    PT Stop Time 1013    PT Time Calculation (min) 43 min    Activity Tolerance Patient tolerated treatment well    Behavior During Therapy WFL for tasks assessed/performed             Past Medical History:  Diagnosis Date   Anemia    Arthritis    Rheumatoid, followed by Dr. Corliss Skains, knees & ankles    Cancer (HCC)    CHF (congestive heart failure) (HCC)    pt not aware of this   Constipation    GERD (gastroesophageal reflux disease)    H/O cardiovascular stress test 02/2014   seen by Dr. Jacinto Halim - told that she was cleared for surgery   Heart murmur    states she's never had any problems   Hypertension    Leg cramps    Shortness of breath dyspnea    with exertion   Sleep apnea    CPAP in use- QO night, study done at [facility]- 2007    Stroke [facility]) 03/2015   TIA   Past Surgical History:  Procedure Laterality Date   ABDOMINAL HYSTERECTOMY     CARPAL TUNNEL RELEASE Right    COLONOSCOPY     COLONOSCOPY WITH PROPOFOL N/A 11/13/2019   Procedure: COLONOSCOPY WITH PROPOFOL;  Surgeon: Jeani Hawking, MD;  [facility]: [facility];  Service: Endoscopy;  Laterality: N/A;   ESOPHAGOGASTRODUODENOSCOPY (EGD) WITH PROPOFOL N/A 11/13/2019   Procedure: ESOPHAGOGASTRODUODENOSCOPY (EGD) WITH PROPOFOL;  Surgeon: Jeani Hawking, MD;  [facility]: [facility];  Service: Endoscopy;  Laterality: N/A;   FOOT SURGERY Bilateral    bunionectomy, calluses , also has several pins & screws   HAND SURGERY     JOINT REPLACEMENT     planned for 03/25/2014   KNEE ARTHROPLASTY     KNEE SURGERY  1990's    LUMBAR LAMINECTOMY/DECOMPRESSION MICRODISCECTOMY N/A 01/17/2015   Procedure: L2-3, L3-4  Decompression;  Surgeon: Eldred Manges, MD;  Location: [facility];  Service: Orthopedics;  Laterality: N/A;   LUMBAR LAMINECTOMY/DECOMPRESSION MICRODISCECTOMY N/A 02/06/2015   Procedure: I AND D LUMBAR WITH WOUND VAC PLACEMENT;  Surgeon: Eldred Manges, MD;  Location: [facility];  Service: Orthopedics;  Laterality: N/A;   MASTECTOMY Right 02/01/2020   ORIF TOE FRACTURE Right 07/30/2016   Procedure: OPEN REDUCTION INTERNAL FIXATION (ORIF) RIGHT 2ND METATARSAL (TOE) FRACTURE;  Surgeon: Eldred Manges, MD;  Location: [facility];  Service: Orthopedics;  Laterality: Right;   POLYPECTOMY  11/13/2019   Procedure: POLYPECTOMY;  Surgeon: Jeani Hawking, MD;  [facility]: [facility];  Service: Endoscopy;;   TOTAL KNEE ARTHROPLASTY Left 03/25/2014   Procedure: TOTAL KNEE ARTHROPLASTY;  Surgeon: Valeria Batman, MD;  Location: [facility] OR;  Service: Orthopedics;  Laterality: Left;   TUBAL LIGATION     Patient Active Problem List   Diagnosis Date Noted   Unilateral primary osteoarthritis, right knee 11/18/2018   Closed fracture of shaft of metatarsal bone 07/30/2016   Metatarsal fracture 07/30/2016   Displaced fracture of second metatarsal bone, right foot, initial encounter for closed fracture 07/26/2016   Arthritis of shoulder 04/10/2016   High risk medication  use 03/16/2016   Primary osteoarthritis of both feet 03/16/2016   Primary osteoarthritis of both knees 03/16/2016   DDD (degenerative disc disease), lumbar 03/16/2016   OSA on CPAP 07/27/2015   Lip numbness 03/19/2015   TIA (transient ischemic attack) 03/19/2015   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HCC) 02/06/2015   Post op infection 02/05/2015   History of lumbar laminectomy for spinal cord decompression 01/17/2015   Lumbar spinal stenosis 01/17/2015   Primary osteoarthritis of left knee 03/25/2014   Rheumatoid arthritis of multiple sites with negative rheumatoid factor (HCC) 03/25/2014   History of total knee replacement, left 03/25/2014   DERMATITIS 09/28/2008    CONSTIPATION 05/25/2008   DYSPNEA ON EXERTION 05/25/2008   ANEMIA, NORMOCYTIC 04/13/2008   ANKLE PAIN, BILATERAL 11/25/2007   HLD (hyperlipidemia) 08/14/2006   Morbid (severe) obesity due to excess calories (HCC) 12/13/2005   CARPAL TUNNEL SYNDROME 12/13/2005   PERIPHERAL NEUROPATHY 12/13/2005   Essential hypertension 12/13/2005   DYSRHYTHMIA, CARDIAC NOS 12/13/2005    PCP: Amil Amen, MD  REFERRING PROVIDER: Griffin Dakin, MD   REFERRING DIAG: Unilateral primary osteoarthritis, right knee; Aftercare following joint replacement surgery; Presence of right artificial knee joint   THERAPY DIAG:  Stiffness of right knee, not elsewhere classified  Acute pain of right knee  Localized edema  Rationale for Evaluation and Treatment: Rehabilitation  ONSET DATE: 08/02/22  SUBJECTIVE:   SUBJECTIVE STATEMENT: Doing good.  PERTINENT HISTORY: Hypertension, history of a TIA, peripheral neuropathy, osteoarthritis, rheumatoid arthritis, chronic low back pain, and history of cancer  PAIN:  Are you having pain? Yes: NPRS scale: 2/10 Pain location: anterior right knee Pain description: nagging, throbbing, and sore Aggravating factors: moving  Relieving factors: ice and medication   PRECAUTIONS: Fall  WEIGHT BEARING RESTRICTIONS: No  FALLS:  Has patient fallen in last 6 months? Yes. Number of falls 1; she was bending over to pick up something off her bathroom floor when she slipped and fell ([facility] 2024)  PATIENT GOALS: be able to walk with her cane, and improved knee mobility  NEXT MD VISIT: 09/18/22  OBJECTIVE:   PATIENT SURVEYS:  FOTO 45.26  COGNITION: Overall cognitive status: Within functional limits for tasks assessed     SENSATION: Patient reports no numbness or tingling  EDEMA:  Circumferential: Right tibiofemoral joint line: 54 cm Left tibiofemoral joint line: 45 cm  PALPATION: TTP: right quadriceps, hip adductors, and  gastrocnemius/soleus  LOWER EXTREMITY ROM:  Active ROM Right eval Left eval Right AROM 08/22/22  Hip flexion     Hip extension     Hip abduction     Hip adduction     Hip internal rotation     Hip external rotation     Knee flexion 81/ 84 (PROM) 118 90  Knee extension 1 0 0  Ankle dorsiflexion     Ankle plantarflexion     Ankle inversion     Ankle eversion      (Blank rows = not tested)  LOWER EXTREMITY MMT: not tested due to surgical condition  LOWER EXTREMITY SPECIAL TESTS:  Not tested due to surgical condition  GAIT: Assistive device utilized: Walker - 2 wheeled Level of assistance: Modified independence Comments: decreased gait speed with step through pattern and decreased stride length   TODAY'S TREATMENT:  DATE:08/28/22  EXERCISE LOG  Exercise Repetitions and Resistance Comments  Nustep L1 moving seat forward x 2 to increase flexion x 15 min   In supine:  Gentle PROM with long holds x 10 minutes f/b LE elevation and vasopneumatic x 15 minutes.   PATIENT EDUCATION:  Education details: reviewed HH HEP, POC, prognosis, healing, edema, objective findings, and goals for therapy Person educated: Patient Education method: Explanation Education comprehension: verbalized understanding  HOME EXERCISE PROGRAM:  ASSESSMENT:  CLINICAL IMPRESSION: Patient is progressing very well and she is pleased with her progress.  She did well with PROM today with low load long duration stretching technique utilized.    OBJECTIVE IMPAIRMENTS: Abnormal gait, decreased activity tolerance, decreased mobility, difficulty walking, decreased ROM, decreased strength, increased edema, impaired tone, and pain.   ACTIVITY LIMITATIONS: carrying, lifting, standing, squatting, stairs, and locomotion level  PARTICIPATION LIMITATIONS: meal prep, cleaning, laundry, shopping,  and community activity  PERSONAL FACTORS: 3+ comorbidities: Hypertension, history of a TIA, peripheral neuropathy, osteoarthritis, rheumatoid arthritis, chronic low back pain, and history of cancer  are also affecting patient's functional outcome.   REHAB POTENTIAL: Good  CLINICAL DECISION MAKING: Stable/uncomplicated  EVALUATION COMPLEXITY: Low  GOALS: Goals reviewed with patient? Yes  SHORT TERM GOALS: Target date: 09/10/22 Patient will be independent with her initial HEP.  Baseline: Goal status: INITIAL  2.  Patient will be able to demonstrate at least 95 degrees of active right knee flexion for improve knee mobility.  Baseline:  Goal status: INITIAL  LONG TERM GOALS: Target date: 10/01/22  Patient will be independent with her advanced HEP.  Baseline:  Goal status: INITIAL  2.  Patient will be able to demonstrate at least 115 degrees of right knee flexion for improved function navigating stairs.  Baseline:  Goal status: INITIAL  3.  Patient will be able to navigate at least 80 feet with a single point cane for improved household mobility.  Baseline:  Goal status: INITIAL  4.  Patient will be able to navigate at least 2 steps with a reciprocal pattern for improved household mobility.  Baseline:  Goal status: INITIAL  PLAN:  PT FREQUENCY: 2x/week  PT DURATION: 6 weeks  PLANNED INTERVENTIONS: Therapeutic exercises, Therapeutic activity, Neuromuscular re-education, Balance training, Gait training, Patient/Family education, Self Care, Joint mobilization, Stair training, Electrical stimulation, Cryotherapy, Moist heat, Vasopneumatic device, Manual therapy, and Re-evaluation  PLAN FOR NEXT SESSION: Nustep, right knee AROM and PROM (focus on knee flexion), manual therapy, gait training, and modalities as needed  Shannan Garfinkel, [place], PT 08/28/2022, 10:51 AM

## 2022-08-29 ENCOUNTER — Other Ambulatory Visit (HOSPITAL_COMMUNITY): Payer: Self-pay

## 2022-08-30 ENCOUNTER — Ambulatory Visit: Payer: Medicare PPO | Admitting: Physical Therapy

## 2022-08-30 DIAGNOSIS — M25661 Stiffness of right knee, not elsewhere classified: Secondary | ICD-10-CM

## 2022-08-30 DIAGNOSIS — M25561 Pain in right knee: Secondary | ICD-10-CM

## 2022-08-30 DIAGNOSIS — R600 Localized edema: Secondary | ICD-10-CM

## 2022-08-30 NOTE — Therapy (Signed)
OUTPATIENT PHYSICAL THERAPY LOWER EXTREMITY TREATMENT   Patient Name: [name] MRN: [id number] DOB:[birth year], 77 y.o., female Today's Date: 08/30/2022  END OF SESSION:  PT End of Session - 08/30/22 1056     Visit Number 4    Number of Visits 12    Date for PT Re-Evaluation 11/02/22    PT Start Time 0930    PT Stop Time 1015    PT Time Calculation (min) 45 min    Activity Tolerance Patient tolerated treatment well    Behavior During Therapy WFL for tasks assessed/performed             Past Medical History:  Diagnosis Date   Anemia    Arthritis    Rheumatoid, followed by Dr. Corliss Skains, knees & ankles    Cancer (HCC)    CHF (congestive heart failure) (HCC)    pt not aware of this   Constipation    GERD (gastroesophageal reflux disease)    H/O cardiovascular stress test 02/2014   seen by Dr. Jacinto Halim - told that she was cleared for surgery   Heart murmur    states she's never had any problems   Hypertension    Leg cramps    Shortness of breath dyspnea    with exertion   Sleep apnea    CPAP in use- QO night, study done at [facility]- 2007    Stroke [facility]) 03/2015   TIA   Past Surgical History:  Procedure Laterality Date   ABDOMINAL HYSTERECTOMY     CARPAL TUNNEL RELEASE Right    COLONOSCOPY     COLONOSCOPY WITH PROPOFOL N/A 11/13/2019   Procedure: COLONOSCOPY WITH PROPOFOL;  Surgeon: Jeani Hawking, MD;  Location: [facility];  Service: Endoscopy;  Laterality: N/A;   ESOPHAGOGASTRODUODENOSCOPY (EGD) WITH PROPOFOL N/A 11/13/2019   Procedure: ESOPHAGOGASTRODUODENOSCOPY (EGD) WITH PROPOFOL;  Surgeon: Jeani Hawking, MD;  Location: [facility];  Service: Endoscopy;  Laterality: N/A;   FOOT SURGERY Bilateral    bunionectomy, calluses , also has several pins & screws   HAND SURGERY     JOINT REPLACEMENT     planned for 03/25/2014   KNEE ARTHROPLASTY     KNEE SURGERY  1990's    LUMBAR LAMINECTOMY/DECOMPRESSION MICRODISCECTOMY N/A 01/17/2015   Procedure: L2-3, L3-4  Decompression;  Surgeon: Eldred Manges, MD;  Location: [facility];  Service: Orthopedics;  Laterality: N/A;   LUMBAR LAMINECTOMY/DECOMPRESSION MICRODISCECTOMY N/A 02/06/2015   Procedure: I AND D LUMBAR WITH WOUND VAC PLACEMENT;  Surgeon: Eldred Manges, MD;  Location: [facility];  Service: Orthopedics;  Laterality: N/A;   MASTECTOMY Right 02/01/2020   ORIF TOE FRACTURE Right 07/30/2016   Procedure: OPEN REDUCTION INTERNAL FIXATION (ORIF) RIGHT 2ND METATARSAL (TOE) FRACTURE;  Surgeon: Eldred Manges, MD;  Location: [facility];  Service: Orthopedics;  Laterality: Right;   POLYPECTOMY  11/13/2019   Procedure: POLYPECTOMY;  Surgeon: Jeani Hawking, MD;  [facility];  Service: Endoscopy;;   TOTAL KNEE ARTHROPLASTY Left 03/25/2014   Procedure: TOTAL KNEE ARTHROPLASTY;  Surgeon: Valeria Batman, MD;  Location: [facility] OR;  Service: Orthopedics;  Laterality: Left;   TUBAL LIGATION     Patient Active Problem List   Diagnosis Date Noted   Unilateral primary osteoarthritis, right knee 11/18/2018   Closed fracture of shaft of metatarsal bone 07/30/2016   Metatarsal fracture 07/30/2016   Displaced fracture of second metatarsal bone, right foot, initial encounter for closed fracture 07/26/2016   Arthritis of shoulder 04/10/2016   High risk medication  use 03/16/2016   Primary osteoarthritis of both feet 03/16/2016   Primary osteoarthritis of both knees 03/16/2016   DDD (degenerative disc disease), lumbar 03/16/2016   OSA on CPAP 07/27/2015   Lip numbness 03/19/2015   TIA (transient ischemic attack) 03/19/2015   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HCC) 02/06/2015   Post op infection 02/05/2015   History of lumbar laminectomy for spinal cord decompression 01/17/2015   Lumbar spinal stenosis 01/17/2015   Primary osteoarthritis of left knee 03/25/2014   Rheumatoid arthritis of multiple sites with negative rheumatoid factor (HCC) 03/25/2014   History of total knee replacement, left 03/25/2014   DERMATITIS 09/28/2008    CONSTIPATION 05/25/2008   DYSPNEA ON EXERTION 05/25/2008   ANEMIA, NORMOCYTIC 04/13/2008   ANKLE PAIN, BILATERAL 11/25/2007   HLD (hyperlipidemia) 08/14/2006   Morbid (severe) obesity due to excess calories (HCC) 12/13/2005   CARPAL TUNNEL SYNDROME 12/13/2005   PERIPHERAL NEUROPATHY 12/13/2005   Essential hypertension 12/13/2005   DYSRHYTHMIA, CARDIAC NOS 12/13/2005    PCP: Amil Amen, MD  REFERRING PROVIDER: Griffin Dakin, MD   REFERRING DIAG: Unilateral primary osteoarthritis, right knee; Aftercare following joint replacement surgery; Presence of right artificial knee joint   THERAPY DIAG:  Stiffness of right knee, not elsewhere classified  Acute pain of right knee  Localized edema  Rationale for Evaluation and Treatment: Rehabilitation  ONSET DATE: 08/02/22  SUBJECTIVE:   SUBJECTIVE STATEMENT: Doing good.  PERTINENT HISTORY: Hypertension, history of a TIA, peripheral neuropathy, osteoarthritis, rheumatoid arthritis, chronic low back pain, and history of cancer  PAIN:  Are you having pain? Yes: NPRS scale: 2/10 Pain location: anterior right knee Pain description: nagging, throbbing, and sore Aggravating factors: moving  Relieving factors: ice and medication   PRECAUTIONS: Fall  WEIGHT BEARING RESTRICTIONS: No  FALLS:  Has patient fallen in last 6 months? Yes. Number of falls 1; she was bending over to pick up something off her bathroom floor when she slipped and fell ([facility] 2024)  PATIENT GOALS: be able to walk with her cane, and improved knee mobility  NEXT MD VISIT: 09/18/22  OBJECTIVE:   PATIENT SURVEYS:  FOTO 45.26  COGNITION: Overall cognitive status: Within functional limits for tasks assessed     SENSATION: Patient reports no numbness or tingling  EDEMA:  Circumferential: Right tibiofemoral joint line: 54 cm Left tibiofemoral joint line: 45 cm  PALPATION: TTP: right quadriceps, hip adductors, and  gastrocnemius/soleus  LOWER EXTREMITY ROM:  Active ROM Right eval Left eval Right AROM 08/22/22  Hip flexion     Hip extension     Hip abduction     Hip adduction     Hip internal rotation     Hip external rotation     Knee flexion 81/ 84 (PROM) 118 90  Knee extension 1 0 0  Ankle dorsiflexion     Ankle plantarflexion     Ankle inversion     Ankle eversion      (Blank rows = not tested)  LOWER EXTREMITY MMT: not tested due to surgical condition  LOWER EXTREMITY SPECIAL TESTS:  Not tested due to surgical condition  GAIT: Assistive device utilized: Walker - 2 wheeled Level of assistance: Modified independence Comments: decreased gait speed with step through pattern and decreased stride length   TODAY'S TREATMENT:  DATE:08/30/22  EXERCISE LOG  Exercise Repetitions and Resistance Comments  Nustep L3 moving seat forward x 2 to increase flexion x 15 min   In supine:  SAQ's x 3 minutes with 3# with 5 sec ext hold f/b gentle PROM with long holds x 5 minutes f/b LE elevation and vasopneumatic x 15 minutes.   PATIENT EDUCATION:  Education details: reviewed HH HEP, POC, prognosis, healing, edema, objective findings, and goals for therapy Person educated: Patient Education method: Explanation Education comprehension: verbalized understanding  HOME EXERCISE PROGRAM:  ASSESSMENT:  CLINICAL IMPRESSION: Excellent progress.  Post stretching she achieved active right knee flexion to 102 degrees and passive to 105 degrees.  OBJECTIVE IMPAIRMENTS: Abnormal gait, decreased activity tolerance, decreased mobility, difficulty walking, decreased ROM, decreased strength, increased edema, impaired tone, and pain.   ACTIVITY LIMITATIONS: carrying, lifting, standing, squatting, stairs, and locomotion level  PARTICIPATION LIMITATIONS: meal prep, cleaning, laundry,  shopping, and community activity  PERSONAL FACTORS: 3+ comorbidities: Hypertension, history of a TIA, peripheral neuropathy, osteoarthritis, rheumatoid arthritis, chronic low back pain, and history of cancer  are also affecting patient's functional outcome.   REHAB POTENTIAL: Good  CLINICAL DECISION MAKING: Stable/uncomplicated  EVALUATION COMPLEXITY: Low  GOALS: Goals reviewed with patient? Yes  SHORT TERM GOALS: Target date: 09/10/22 Patient will be independent with her initial HEP.  Baseline: Goal status: INITIAL  2.  Patient will be able to demonstrate at least 95 degrees of active right knee flexion for improve knee mobility.  Baseline:  Goal status: INITIAL  LONG TERM GOALS: Target date: 10/01/22  Patient will be independent with her advanced HEP.  Baseline:  Goal status: INITIAL  2.  Patient will be able to demonstrate at least 115 degrees of right knee flexion for improved function navigating stairs.  Baseline:  Goal status: INITIAL  3.  Patient will be able to navigate at least 80 feet with a single point cane for improved household mobility.  Baseline:  Goal status: INITIAL  4.  Patient will be able to navigate at least 2 steps with a reciprocal pattern for improved household mobility.  Baseline:  Goal status: INITIAL  PLAN:  PT FREQUENCY: 2x/week  PT DURATION: 6 weeks  PLANNED INTERVENTIONS: Therapeutic exercises, Therapeutic activity, Neuromuscular re-education, Balance training, Gait training, Patient/Family education, Self Care, Joint mobilization, Stair training, Electrical stimulation, Cryotherapy, Moist heat, Vasopneumatic device, Manual therapy, and Re-evaluation  PLAN FOR NEXT SESSION: Nustep, right knee AROM and PROM (focus on knee flexion), manual therapy, gait training, and modalities as needed  Danniela Mcbrearty, [place], PT 08/30/2022, 11:01 AM

## 2022-09-03 ENCOUNTER — Ambulatory Visit: Payer: Medicare PPO

## 2022-09-03 DIAGNOSIS — R600 Localized edema: Secondary | ICD-10-CM

## 2022-09-03 DIAGNOSIS — M25661 Stiffness of right knee, not elsewhere classified: Secondary | ICD-10-CM | POA: Diagnosis not present

## 2022-09-03 DIAGNOSIS — M25561 Pain in right knee: Secondary | ICD-10-CM

## 2022-09-03 NOTE — Therapy (Signed)
OUTPATIENT PHYSICAL THERAPY LOWER EXTREMITY TREATMENT   Patient Name: [name] MRN: [id number] DOB:[birth year], 77 y.o., female Today's Date: 09/03/2022  END OF SESSION:  PT End of Session - 09/03/22 0934     Visit Number 5    Number of Visits 12    Date for PT Re-Evaluation 11/02/22    PT Start Time 0930    PT Stop Time 1030    PT Time Calculation (min) 60 min    Activity Tolerance Patient tolerated treatment well    Behavior During Therapy WFL for tasks assessed/performed             Past Medical History:  Diagnosis Date   Anemia    Arthritis    Rheumatoid, followed by Dr. Corliss Skains, knees & ankles    Cancer (HCC)    CHF (congestive heart failure) (HCC)    pt not aware of this   Constipation    GERD (gastroesophageal reflux disease)    H/O cardiovascular stress test 02/2014   seen by Dr. Jacinto Halim - told that she was cleared for surgery   Heart murmur    states she's never had any problems   Hypertension    Leg cramps    Shortness of breath dyspnea    with exertion   Sleep apnea    CPAP in use- QO night, study done at [facility]- 2007    Stroke [facility]) 03/2015   TIA   Past Surgical History:  Procedure Laterality Date   ABDOMINAL HYSTERECTOMY     CARPAL TUNNEL RELEASE Right    COLONOSCOPY     COLONOSCOPY WITH PROPOFOL N/A 11/13/2019   Procedure: COLONOSCOPY WITH PROPOFOL;  Surgeon: Jeani Hawking, MD;  [facility];  Service: Endoscopy;  Laterality: N/A;   ESOPHAGOGASTRODUODENOSCOPY (EGD) WITH PROPOFOL N/A 11/13/2019   Procedure: ESOPHAGOGASTRODUODENOSCOPY (EGD) WITH PROPOFOL;  Surgeon: Jeani Hawking, MD;  [facility];  Service: Endoscopy;  Laterality: N/A;   FOOT SURGERY Bilateral    bunionectomy, calluses , also has several pins & screws   HAND SURGERY     JOINT REPLACEMENT     planned for 03/25/2014   KNEE ARTHROPLASTY     KNEE SURGERY  1990's    LUMBAR LAMINECTOMY/DECOMPRESSION MICRODISCECTOMY N/A 01/17/2015   Procedure: L2-3, L3-4  Decompression;  Surgeon: Eldred Manges, MD;  [facility];  Service: Orthopedics;  Laterality: N/A;   LUMBAR LAMINECTOMY/DECOMPRESSION MICRODISCECTOMY N/A 02/06/2015   Procedure: I AND D LUMBAR WITH WOUND VAC PLACEMENT;  Surgeon: Eldred Manges, MD;  [facility]: [facility];  Service: Orthopedics;  Laterality: N/A;   MASTECTOMY Right 02/01/2020   ORIF TOE FRACTURE Right 07/30/2016   Procedure: OPEN REDUCTION INTERNAL FIXATION (ORIF) RIGHT 2ND METATARSAL (TOE) FRACTURE;  Surgeon: Eldred Manges, MD;  Location: [facility];  Service: Orthopedics;  Laterality: Right;   POLYPECTOMY  11/13/2019   Procedure: POLYPECTOMY;  Surgeon: Jeani Hawking, MD;  [facility];  Service: Endoscopy;;   TOTAL KNEE ARTHROPLASTY Left 03/25/2014   Procedure: TOTAL KNEE ARTHROPLASTY;  Surgeon: Valeria Batman, MD;  Location: [facility] OR;  Service: Orthopedics;  Laterality: Left;   TUBAL LIGATION     Patient Active Problem List   Diagnosis Date Noted   Unilateral primary osteoarthritis, right knee 11/18/2018   Closed fracture of shaft of metatarsal bone 07/30/2016   Metatarsal fracture 07/30/2016   Displaced fracture of second metatarsal bone, right foot, initial encounter for closed fracture 07/26/2016   Arthritis of shoulder 04/10/2016   High risk medication  use 03/16/2016   Primary osteoarthritis of both feet 03/16/2016   Primary osteoarthritis of both knees 03/16/2016   DDD (degenerative disc disease), lumbar 03/16/2016   OSA on CPAP 07/27/2015   Lip numbness 03/19/2015   TIA (transient ischemic attack) 03/19/2015   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HCC) 02/06/2015   Post op infection 02/05/2015   History of lumbar laminectomy for spinal cord decompression 01/17/2015   Lumbar spinal stenosis 01/17/2015   Primary osteoarthritis of left knee 03/25/2014   Rheumatoid arthritis of multiple sites with negative rheumatoid factor (HCC) 03/25/2014   History of total knee replacement, left 03/25/2014   DERMATITIS 09/28/2008    CONSTIPATION 05/25/2008   DYSPNEA ON EXERTION 05/25/2008   ANEMIA, NORMOCYTIC 04/13/2008   ANKLE PAIN, BILATERAL 11/25/2007   HLD (hyperlipidemia) 08/14/2006   Morbid (severe) obesity due to excess calories (HCC) 12/13/2005   CARPAL TUNNEL SYNDROME 12/13/2005   PERIPHERAL NEUROPATHY 12/13/2005   Essential hypertension 12/13/2005   DYSRHYTHMIA, CARDIAC NOS 12/13/2005    PCP: Amil Amen, MD  REFERRING PROVIDER: Griffin Dakin, MD   REFERRING DIAG: Unilateral primary osteoarthritis, right knee; Aftercare following joint replacement surgery; Presence of right artificial knee joint   THERAPY DIAG:  Stiffness of right knee, not elsewhere classified  Acute pain of right knee  Localized edema  Rationale for Evaluation and Treatment: Rehabilitation  ONSET DATE: 08/02/22  SUBJECTIVE:   SUBJECTIVE STATEMENT: Pt denies any pain today.  Pt reports mild soreness around incision.    PERTINENT HISTORY: Hypertension, history of a TIA, peripheral neuropathy, osteoarthritis, rheumatoid arthritis, chronic low back pain, and history of cancer  PAIN:  Are you having pain? No  PRECAUTIONS: Fall  WEIGHT BEARING RESTRICTIONS: No  FALLS:  Has patient fallen in last 6 months? Yes. Number of falls 1; she was bending over to pick up something off her bathroom floor when she slipped and fell ([facility] 2024)  PATIENT GOALS: be able to walk with her cane, and improved knee mobility  NEXT MD VISIT: 09/18/22  OBJECTIVE:   PATIENT SURVEYS:  FOTO 45.26  COGNITION: Overall cognitive status: Within functional limits for tasks assessed     SENSATION: Patient reports no numbness or tingling  EDEMA:  Circumferential: Right tibiofemoral joint line: 54 cm Left tibiofemoral joint line: 45 cm  PALPATION: TTP: right quadriceps, hip adductors, and gastrocnemius/soleus  LOWER EXTREMITY ROM:  Active ROM Right eval Left eval Right AROM 08/22/22  Hip flexion     Hip  extension     Hip abduction     Hip adduction     Hip internal rotation     Hip external rotation     Knee flexion 81/ 84 (PROM) 118 90  Knee extension 1 0 0  Ankle dorsiflexion     Ankle plantarflexion     Ankle inversion     Ankle eversion      (Blank rows = not tested)  LOWER EXTREMITY MMT: not tested due to surgical condition  LOWER EXTREMITY SPECIAL TESTS:  Not tested due to surgical condition  GAIT: Assistive device utilized: Walker - 2 wheeled Level of assistance: Modified independence Comments: decreased gait speed with step through pattern and decreased stride length   TODAY'S TREATMENT:  DATE:09/03/22  EXERCISE LOG  Exercise Repetitions and Resistance Comments  Nustep L3 x 15 mins seat 8 - 6   LAQ 2# x20 reps bil   Seated Marches 2# x20 reps bil   Ball Squeeze 3 mins   Clams Red x 3 mins    Heel Slides X3 mins        Modalities  Date:  Vaso: Knee, 34 degrees, 15 mins, Pain and Edema   PATIENT EDUCATION:  Education details: reviewed HH HEP, POC, prognosis, healing, edema, objective findings, and goals for therapy Person educated: Patient Education method: Explanation Education comprehension: verbalized understanding  HOME EXERCISE PROGRAM:  ASSESSMENT:  CLINICAL IMPRESSION: Pt arrives for today's treatment session denying any pain, but does report mild right knee soreness around incision.  Pt able to increase FOTO score to 66 today.  Pt able to demonstrate 95 degrees of active flexion today, meeting her short term goal.  Pt introduced to seated BLE exercises today with pt requiring min cues for proper technique and posture.  Pt requiring rest breaks during supine heel due to right hamstring cramping.  Normal responses to vaso noted upon removal.  Pt denied any pain at completion of today's treatment session.  OBJECTIVE IMPAIRMENTS:  Abnormal gait, decreased activity tolerance, decreased mobility, difficulty walking, decreased ROM, decreased strength, increased edema, impaired tone, and pain.   ACTIVITY LIMITATIONS: carrying, lifting, standing, squatting, stairs, and locomotion level  PARTICIPATION LIMITATIONS: meal prep, cleaning, laundry, shopping, and community activity  PERSONAL FACTORS: 3+ comorbidities: Hypertension, history of a TIA, peripheral neuropathy, osteoarthritis, rheumatoid arthritis, chronic low back pain, and history of cancer  are also affecting patient's functional outcome.   REHAB POTENTIAL: Good  CLINICAL DECISION MAKING: Stable/uncomplicated  EVALUATION COMPLEXITY: Low  GOALS: Goals reviewed with patient? Yes  SHORT TERM GOALS: Target date: 09/10/22 Patient will be independent with her initial HEP.  Baseline: Goal status: MET  2.  Patient will be able to demonstrate at least 95 degrees of active right knee flexion for improve knee mobility.  Baseline: 8/26: 95 degrees Goal status: MET  LONG TERM GOALS: Target date: 10/01/22  Patient will be independent with her advanced HEP.  Baseline:  Goal status: IN PROGRESS  2.  Patient will be able to demonstrate at least 115 degrees of right knee flexion for improved function navigating stairs.  Baseline:  Goal status: IN PROGRESS  3.  Patient will be able to navigate at least 80 feet with a single point cane for improved household mobility.  Baseline:  Goal status: IN PROGRESS  4.  Patient will be able to navigate at least 2 steps with a reciprocal pattern for improved household mobility.  Baseline:  Goal status: IN PROGRESS  PLAN:  PT FREQUENCY: 2x/week  PT DURATION: 6 weeks  PLANNED INTERVENTIONS: Therapeutic exercises, Therapeutic activity, Neuromuscular re-education, Balance training, Gait training, Patient/Family education, Self Care, Joint mobilization, Stair training, Electrical stimulation, Cryotherapy, Moist heat, Vasopneumatic  device, Manual therapy, and Re-evaluation  PLAN FOR NEXT SESSION: Nustep, lunges, and gait training with SPC  Newman Pies, PTA 09/03/2022, 10:33 AM

## 2022-09-04 ENCOUNTER — Encounter: Payer: Self-pay | Admitting: Physical Therapy

## 2022-09-04 ENCOUNTER — Ambulatory Visit: Payer: Medicare PPO | Admitting: Physical Therapy

## 2022-09-04 DIAGNOSIS — R600 Localized edema: Secondary | ICD-10-CM

## 2022-09-04 DIAGNOSIS — M25661 Stiffness of right knee, not elsewhere classified: Secondary | ICD-10-CM | POA: Diagnosis not present

## 2022-09-04 DIAGNOSIS — M25561 Pain in right knee: Secondary | ICD-10-CM

## 2022-09-04 NOTE — Therapy (Signed)
OUTPATIENT PHYSICAL THERAPY LOWER EXTREMITY TREATMENT   Patient Name: [name] MRN: [id number] DOB:[birth year], 77 y.o., female Today's Date: 09/04/2022  END OF SESSION:  PT End of Session - 09/04/22 1018     Visit Number 6    Number of Visits 12    Date for PT Re-Evaluation 11/02/22    PT Start Time 1018    PT Stop Time 1051    PT Time Calculation (min) 33 min    Equipment Utilized During Treatment Other (comment)   FWW, SPC   Activity Tolerance Patient tolerated treatment well    Behavior During Therapy WFL for tasks assessed/performed            Past Medical History:  Diagnosis Date   Anemia    Arthritis    Rheumatoid, followed by Dr. Corliss Skains, knees & ankles    Cancer (HCC)    CHF (congestive heart failure) (HCC)    pt not aware of this   Constipation    GERD (gastroesophageal reflux disease)    H/O cardiovascular stress test 02/2014   seen by Dr. Jacinto Halim - told that she was cleared for surgery   Heart murmur    states she's never had any problems   Hypertension    Leg cramps    Shortness of breath dyspnea    with exertion   Sleep apnea    CPAP in use- QO night, study done at [facility]- 2007    Stroke [facility]) 03/2015   TIA   Past Surgical History:  Procedure Laterality Date   ABDOMINAL HYSTERECTOMY     CARPAL TUNNEL RELEASE Right    COLONOSCOPY     COLONOSCOPY WITH PROPOFOL N/A 11/13/2019   Procedure: COLONOSCOPY WITH PROPOFOL;  Surgeon: Jeani Hawking, MD;  Location: [facility];  Service: Endoscopy;  Laterality: N/A;   ESOPHAGOGASTRODUODENOSCOPY (EGD) WITH PROPOFOL N/A 11/13/2019   Procedure: ESOPHAGOGASTRODUODENOSCOPY (EGD) WITH PROPOFOL;  Surgeon: Jeani Hawking, MD;  Location: [facility];  Service: Endoscopy;  Laterality: N/A;   FOOT SURGERY Bilateral    bunionectomy, calluses , also has several pins & screws   HAND SURGERY     JOINT REPLACEMENT     planned for 03/25/2014   KNEE ARTHROPLASTY     KNEE SURGERY  1990's    LUMBAR  LAMINECTOMY/DECOMPRESSION MICRODISCECTOMY N/A 01/17/2015   Procedure: L2-3, L3-4 Decompression;  Surgeon: Eldred Manges, MD;  Location: [facility];  Service: Orthopedics;  Laterality: N/A;   LUMBAR LAMINECTOMY/DECOMPRESSION MICRODISCECTOMY N/A 02/06/2015   Procedure: I AND D LUMBAR WITH WOUND VAC PLACEMENT;  Surgeon: Eldred Manges, MD;  Location: [facility] OR;  Service: Orthopedics;  Laterality: N/A;   MASTECTOMY Right 02/01/2020   ORIF TOE FRACTURE Right 07/30/2016   Procedure: OPEN REDUCTION INTERNAL FIXATION (ORIF) RIGHT 2ND METATARSAL (TOE) FRACTURE;  Surgeon: Eldred Manges, MD;  Location: [facility];  Service: Orthopedics;  Laterality: Right;   POLYPECTOMY  11/13/2019   Procedure: POLYPECTOMY;  Surgeon: Jeani Hawking, MD;  [facility]: [facility];  Service: Endoscopy;;   TOTAL KNEE ARTHROPLASTY Left 03/25/2014   Procedure: TOTAL KNEE ARTHROPLASTY;  Surgeon: Valeria Batman, MD;  Location: [facility] OR;  Service: Orthopedics;  Laterality: Left;   TUBAL LIGATION     Patient Active Problem List   Diagnosis Date Noted   Unilateral primary osteoarthritis, right knee 11/18/2018   Closed fracture of shaft of metatarsal bone 07/30/2016   Metatarsal fracture 07/30/2016   Displaced fracture of second metatarsal bone, right foot, initial encounter for closed fracture 07/26/2016  Arthritis of shoulder 04/10/2016   High risk medication use 03/16/2016   Primary osteoarthritis of both feet 03/16/2016   Primary osteoarthritis of both knees 03/16/2016   DDD (degenerative disc disease), lumbar 03/16/2016   OSA on CPAP 07/27/2015   Lip numbness 03/19/2015   TIA (transient ischemic attack) 03/19/2015   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HCC) 02/06/2015   Post op infection 02/05/2015   History of lumbar laminectomy for spinal cord decompression 01/17/2015   Lumbar spinal stenosis 01/17/2015   Primary osteoarthritis of left knee 03/25/2014   Rheumatoid arthritis of multiple sites with negative rheumatoid factor (HCC) 03/25/2014    History of total knee replacement, left 03/25/2014   DERMATITIS 09/28/2008   CONSTIPATION 05/25/2008   DYSPNEA ON EXERTION 05/25/2008   ANEMIA, NORMOCYTIC 04/13/2008   ANKLE PAIN, BILATERAL 11/25/2007   HLD (hyperlipidemia) 08/14/2006   Morbid (severe) obesity due to excess calories (HCC) 12/13/2005   CARPAL TUNNEL SYNDROME 12/13/2005   PERIPHERAL NEUROPATHY 12/13/2005   Essential hypertension 12/13/2005   DYSRHYTHMIA, CARDIAC NOS 12/13/2005   PCP: Amil Amen, MD  REFERRING PROVIDER: Griffin Dakin, MD   REFERRING DIAG: Unilateral primary osteoarthritis, right knee; Aftercare following joint replacement surgery; Presence of right artificial knee joint   THERAPY DIAG:  Stiffness of right knee, not elsewhere classified  Acute pain of right knee  Localized edema  Rationale for Evaluation and Treatment: Rehabilitation  ONSET DATE: 08/02/22  SUBJECTIVE:   SUBJECTIVE STATEMENT: Reports only soreness in surgical knee but wishes to work on lunges and gait with cane today.  PERTINENT HISTORY: Hypertension, history of a TIA, peripheral neuropathy, osteoarthritis, rheumatoid arthritis, chronic low back pain, and history of cancer  PAIN:  Are you having pain? Yes: NPRS scale: 3/10 Pain location: R knee   Pain description: Sore Aggravating factors:   Relieving factors:      PRECAUTIONS: Fall  WEIGHT BEARING RESTRICTIONS: No  FALLS:  Has patient fallen in last 6 months? Yes. Number of falls 1; she was bending over to pick up something off her bathroom floor when she slipped and fell ([facility] 2024)  PATIENT GOALS: be able to walk with her cane, and improved knee mobility  NEXT MD VISIT: 09/18/22  OBJECTIVE:   PATIENT SURVEYS:  FOTO 45.26  COGNITION: Overall cognitive status: Within functional limits for tasks assessed     SENSATION: Patient reports no numbness or tingling  EDEMA:  Circumferential: Right tibiofemoral joint line: 54 cm Left  tibiofemoral joint line: 45 cm  PALPATION: TTP: right quadriceps, hip adductors, and gastrocnemius/soleus  LOWER EXTREMITY ROM:  Active ROM Right eval Left eval Right AROM 08/22/22 Right AROM 09/04/2022  Hip flexion      Hip extension      Hip abduction      Hip adduction      Hip internal rotation      Hip external rotation      Knee flexion 81/ 84 (PROM) 118 90 108  Knee extension 1 0 0 0  Ankle dorsiflexion      Ankle plantarflexion      Ankle inversion      Ankle eversion       (Blank rows = not tested)  LOWER EXTREMITY MMT: not tested due to surgical condition   GAIT: Assistive device utilized: Single point cane Level of assistance: Modified independence Comments: decreased gait speed, decreased knee flexion   TODAY'S TREATMENT:  DATE:09/04/22  EXERCISE LOG  Exercise Repetitions and Resistance Comments  Nustep L3 x 15 mins seat 8 - 6   Gait training  X1 lap with SPC WNL pattern  Lunges 8" step; 1 handrail, opp SPC x20 reps Instructed for home   Seated heel slides X20 reps with end range holds   Sit to stands X10 reps no UE support   LAQ 4# x20 reps        PATIENT EDUCATION:  Education details: reviewed HH HEP, POC, prognosis, healing, edema, objective findings, and goals for therapy Person educated: Patient Education method: Explanation Education comprehension: verbalized understanding  HOME EXERCISE PROGRAM:  Lunges to stairs that she has access to. Was practiced in clinic on 09/04/22  ASSESSMENT:  CLINICAL IMPRESSION: Patient presented in clinic with minimal soreness of the R knee. Patient progressed to gait training with [facility] as she has access to. Patient observed with decreased knee flexion and decreased gait speed. Patient also instructed with lunges as she requested in standing and heel slides in sitting for home completion.  Patient had no complaints with therex session. No modalities completed due to patient request.   OBJECTIVE IMPAIRMENTS: Abnormal gait, decreased activity tolerance, decreased mobility, difficulty walking, decreased ROM, decreased strength, increased edema, impaired tone, and pain.   ACTIVITY LIMITATIONS: carrying, lifting, standing, squatting, stairs, and locomotion level  PARTICIPATION LIMITATIONS: meal prep, cleaning, laundry, shopping, and community activity  PERSONAL FACTORS: 3+ comorbidities: Hypertension, history of a TIA, peripheral neuropathy, osteoarthritis, rheumatoid arthritis, chronic low back pain, and history of cancer  are also affecting patient's functional outcome.   REHAB POTENTIAL: Good  CLINICAL DECISION MAKING: Stable/uncomplicated  EVALUATION COMPLEXITY: Low  GOALS: Goals reviewed with patient? Yes  SHORT TERM GOALS: Target date: 09/10/22 Patient will be independent with her initial HEP.  Baseline: Goal status: MET  2.  Patient will be able to demonstrate at least 95 degrees of active right knee flexion for improve knee mobility.  Baseline: 8/26: 95 degrees Goal status: MET  LONG TERM GOALS: Target date: 10/01/22  Patient will be independent with her advanced HEP.  Baseline:  Goal status: IN PROGRESS  2.  Patient will be able to demonstrate at least 115 degrees of right knee flexion for improved function navigating stairs.  Baseline:  Goal status: IN PROGRESS  3.  Patient will be able to navigate at least 80 feet with a single point cane for improved household mobility.  Baseline:  Goal status: MET  4.  Patient will be able to navigate at least 2 steps with a reciprocal pattern for improved household mobility.  Baseline:  Goal status: IN PROGRESS  PLAN:  PT FREQUENCY: 2x/week  PT DURATION: 6 weeks  PLANNED INTERVENTIONS: Therapeutic exercises, Therapeutic activity, Neuromuscular re-education, Balance training, Gait training, Patient/Family  education, Self Care, Joint mobilization, Stair training, Electrical stimulation, Cryotherapy, Moist heat, Vasopneumatic device, Manual therapy, and Re-evaluation  PLAN FOR NEXT SESSION: Nustep, lunges, and gait training with SPC  Tyron Russell Zakk Borgen, PTA 09/04/2022, 10:54 AM

## 2022-09-05 ENCOUNTER — Other Ambulatory Visit: Payer: Self-pay | Admitting: Physician Assistant

## 2022-09-05 DIAGNOSIS — Z79899 Other long term (current) drug therapy: Secondary | ICD-10-CM

## 2022-09-05 DIAGNOSIS — Z5181 Encounter for therapeutic drug level monitoring: Secondary | ICD-10-CM

## 2022-09-05 NOTE — Telephone Encounter (Signed)
Last Fill: 08/06/2022  UDS:01/10/2022 UDS is consistent with tramadol use.     Narc Agreement: 05/09/2021   Next Visit: Due August 2024. Message sent to the front to schedule.   Last Visit: 03/23/2022  Dx: Rheumatoid arthritis of multiple sites with negative rheumatoid factor   Current Dose per office note on 03/23/2022: Tramadol 50 mg 1 to 2 tablets daily as needed for pain relief.    Okay to refill Tramadol?

## 2022-09-05 NOTE — Telephone Encounter (Signed)
Patient will call back to schedule when she gets clearance from her surgeon.  Patient states her next appointment is 09/18/22.

## 2022-09-05 NOTE — Telephone Encounter (Signed)
LMOM to schedule an appt.

## 2022-09-05 NOTE — Telephone Encounter (Signed)
Please schedule patient a follow up visit. Patient due August 2024. Thanks!  

## 2022-09-11 ENCOUNTER — Encounter: Payer: Self-pay | Admitting: Physical Therapy

## 2022-09-11 ENCOUNTER — Ambulatory Visit: Payer: Medicare PPO | Attending: Orthopedic Surgery | Admitting: Physical Therapy

## 2022-09-11 DIAGNOSIS — M25561 Pain in right knee: Secondary | ICD-10-CM | POA: Insufficient documentation

## 2022-09-11 DIAGNOSIS — R600 Localized edema: Secondary | ICD-10-CM | POA: Insufficient documentation

## 2022-09-11 DIAGNOSIS — M25661 Stiffness of right knee, not elsewhere classified: Secondary | ICD-10-CM | POA: Diagnosis present

## 2022-09-11 NOTE — Therapy (Addendum)
OUTPATIENT PHYSICAL THERAPY LOWER EXTREMITY TREATMENT   Patient Name: [name] MRN: [id number] DOB:12-May-1945, 77 y.o., female Today's Date: 09/11/2022  END OF SESSION:  PT End of Session - 09/11/22 0933     Visit Number 7    Number of Visits 12    Date for PT Re-Evaluation 11/02/22    PT Start Time 0932    PT Stop Time 1018    PT Time Calculation (min) 46 min    Equipment Utilized During Treatment Other (comment)   SPC   Activity Tolerance Patient tolerated treatment well    Behavior During Therapy WFL for tasks assessed/performed            Past Medical History:  Diagnosis Date   Anemia    Arthritis    Rheumatoid, followed by Dr. Corliss Skains, knees & ankles    Cancer (HCC)    CHF (congestive heart failure) (HCC)    pt not aware of this   Constipation    GERD (gastroesophageal reflux disease)    H/O cardiovascular stress test 02/2014   seen by Dr. Jacinto Halim - told that she was cleared for surgery   Heart murmur    states she's never had any problems   Hypertension    Leg cramps    Shortness of breath dyspnea    with exertion   Sleep apnea    CPAP in use- QO night, study done at [facility]- 2007    Stroke [facility]) 03/2015   TIA   Past Surgical History:  Procedure Laterality Date   ABDOMINAL HYSTERECTOMY     CARPAL TUNNEL RELEASE Right    COLONOSCOPY     COLONOSCOPY WITH PROPOFOL N/A 11/13/2019   Procedure: COLONOSCOPY WITH PROPOFOL;  Surgeon: Jeani Hawking, MD;  [facility];  Service: Endoscopy;  Laterality: N/A;   ESOPHAGOGASTRODUODENOSCOPY (EGD) WITH PROPOFOL N/A 11/13/2019   Procedure: ESOPHAGOGASTRODUODENOSCOPY (EGD) WITH PROPOFOL;  Surgeon: Jeani Hawking, MD;  [facility]: [facility];  Service: Endoscopy;  Laterality: N/A;   FOOT SURGERY Bilateral    bunionectomy, calluses , also has several pins & screws   HAND SURGERY     JOINT REPLACEMENT     planned for 03/25/2014   KNEE ARTHROPLASTY     KNEE SURGERY  1990's    LUMBAR LAMINECTOMY/DECOMPRESSION  MICRODISCECTOMY N/A 01/17/2015   Procedure: L2-3, L3-4 Decompression;  Surgeon: Eldred Manges, MD;  Location: [facility];  Service: Orthopedics;  Laterality: N/A;   LUMBAR LAMINECTOMY/DECOMPRESSION MICRODISCECTOMY N/A 02/06/2015   Procedure: I AND D LUMBAR WITH WOUND VAC PLACEMENT;  Surgeon: Eldred Manges, MD;  Location: [facility] OR;  Service: Orthopedics;  Laterality: N/A;   MASTECTOMY Right 02/01/2020   ORIF TOE FRACTURE Right 07/30/2016   Procedure: OPEN REDUCTION INTERNAL FIXATION (ORIF) RIGHT 2ND METATARSAL (TOE) FRACTURE;  Surgeon: Eldred Manges, MD;  Location: [facility] OR;  Service: Orthopedics;  Laterality: Right;   POLYPECTOMY  11/13/2019   Procedure: POLYPECTOMY;  Surgeon: Jeani Hawking, MD;  [facility];  Service: Endoscopy;;   TOTAL KNEE ARTHROPLASTY Left 03/25/2014   Procedure: TOTAL KNEE ARTHROPLASTY;  Surgeon: Valeria Batman, MD;  Location[facility];  Service: Orthopedics;  Laterality: Left;   TUBAL LIGATION     Patient Active Problem List   Diagnosis Date Noted   Unilateral primary osteoarthritis, right knee 11/18/2018   Closed fracture of shaft of metatarsal bone 07/30/2016   Metatarsal fracture 07/30/2016   Displaced fracture of second metatarsal bone, right foot, initial encounter for closed fracture 07/26/2016  Arthritis of shoulder 04/10/2016   High risk medication use 03/16/2016   Primary osteoarthritis of both feet 03/16/2016   Primary osteoarthritis of both knees 03/16/2016   DDD (degenerative disc disease), lumbar 03/16/2016   OSA on CPAP 07/27/2015   Lip numbness 03/19/2015   TIA (transient ischemic attack) 03/19/2015   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HCC) 02/06/2015   Post op infection 02/05/2015   History of lumbar laminectomy for spinal cord decompression 01/17/2015   Lumbar spinal stenosis 01/17/2015   Primary osteoarthritis of left knee 03/25/2014   Rheumatoid arthritis of multiple sites with negative rheumatoid factor (HCC) 03/25/2014   History of total knee  replacement, left 03/25/2014   DERMATITIS 09/28/2008   CONSTIPATION 05/25/2008   DYSPNEA ON EXERTION 05/25/2008   ANEMIA, NORMOCYTIC 04/13/2008   ANKLE PAIN, BILATERAL 11/25/2007   HLD (hyperlipidemia) 08/14/2006   Morbid (severe) obesity due to excess calories (HCC) 12/13/2005   CARPAL TUNNEL SYNDROME 12/13/2005   PERIPHERAL NEUROPATHY 12/13/2005   Essential hypertension 12/13/2005   DYSRHYTHMIA, CARDIAC NOS 12/13/2005   PCP: Amil Amen, MD  REFERRING PROVIDER: Griffin Dakin, MD   REFERRING DIAG: Unilateral primary osteoarthritis, right knee; Aftercare following joint replacement surgery; Presence of right artificial knee joint   THERAPY DIAG:  Stiffness of right knee, not elsewhere classified  Acute pain of right knee  Localized edema  Rationale for Evaluation and Treatment: Rehabilitation  ONSET DATE: 08/02/22  SUBJECTIVE:   SUBJECTIVE STATEMENT: Still feels a little unsteady with SPC but overall okay.  PERTINENT HISTORY: Hypertension, history of a TIA, peripheral neuropathy, osteoarthritis, rheumatoid arthritis, chronic low back pain, and history of cancer  PAIN:  Are you having pain? Yes: NPRS scale: 3/10 Pain location: R knee   Pain description: Sore Aggravating factors:   Relieving factors:      PRECAUTIONS: Fall  WEIGHT BEARING RESTRICTIONS: No  FALLS:  Has patient fallen in last 6 months? Yes. Number of falls 1; she was bending over to pick up something off her bathroom floor when she slipped and fell ([facility] 2024)  PATIENT GOALS: be able to walk with her cane, and improved knee mobility  NEXT MD VISIT: 09/18/22  OBJECTIVE:   PATIENT SURVEYS:  FOTO 45.26  COGNITION: Overall cognitive status: Within functional limits for tasks assessed     SENSATION: Patient reports no numbness or tingling  EDEMA:  Circumferential: Right tibiofemoral joint line: 54 cm Left tibiofemoral joint line: 45 cm  PALPATION: TTP: right  quadriceps, hip adductors, and gastrocnemius/soleus  LOWER EXTREMITY ROM:  Active ROM Right eval Left eval Right AROM 08/22/22 Right AROM 09/04/2022 Right AROM 09/11/22  Hip flexion       Hip extension       Hip abduction       Hip adduction       Hip internal rotation       Hip external rotation       Knee flexion 81/ 84 (PROM) 118 90 108 107  Knee extension 1 0 0 0   Ankle dorsiflexion       Ankle plantarflexion       Ankle inversion       Ankle eversion        (Blank rows = not tested)  LOWER EXTREMITY MMT: not tested due to surgical condition   GAIT: Assistive device utilized: Single point cane Level of assistance: Modified independence Comments: decreased gait speed, decreased knee flexion   TODAY'S TREATMENT:  DATE:09/04/22  EXERCISE LOG  Exercise Repetitions and Resistance Comments  Nustep L3 x 15 mins seat 7   Slant board X2 min   Lunges 8" step; 1 handrail, opp SPC x20 reps Instructed for home   Forward step up 6" step x20 reps   Sit to stands X15 reps no UE support   LAQ 4# x20 reps   HS curl Standing x15 reps each   Hip abduction Standing x15 reps each        Modalities  Date: 09/11/22 Vaso: Knee, Low, 10 mins, Edema  PATIENT EDUCATION:  Education details: reviewed HH HEP, POC, prognosis, healing, edema, objective findings, and goals for therapy Person educated: Patient Education method: Explanation Education comprehension: verbalized understanding  HOME EXERCISE PROGRAM:  Lunges to stairs that she has access to. Was practiced in clinic on 09/04/22  ASSESSMENT:  CLINICAL IMPRESSION: Patient presented in clinic with reports of unsteadiness at times with [facility]. Patient progressed to LE strengthening to assist with improving unsteadiness now with less support. Patient able to tolerate therex session well with mod VC. Normal  vasopneumatic response noted following removal of the modality.  OBJECTIVE IMPAIRMENTS: Abnormal gait, decreased activity tolerance, decreased mobility, difficulty walking, decreased ROM, decreased strength, increased edema, impaired tone, and pain.   ACTIVITY LIMITATIONS: carrying, lifting, standing, squatting, stairs, and locomotion level  PARTICIPATION LIMITATIONS: meal prep, cleaning, laundry, shopping, and community activity  PERSONAL FACTORS: 3+ comorbidities: Hypertension, history of a TIA, peripheral neuropathy, osteoarthritis, rheumatoid arthritis, chronic low back pain, and history of cancer  are also affecting patient's functional outcome.   REHAB POTENTIAL: Good  CLINICAL DECISION MAKING: Stable/uncomplicated  EVALUATION COMPLEXITY: Low  GOALS: Goals reviewed with patient? Yes  SHORT TERM GOALS: Target date: 09/10/22 Patient will be independent with her initial HEP.  Baseline: Goal status: MET  2.  Patient will be able to demonstrate at least 95 degrees of active right knee flexion for improve knee mobility.  Baseline: 8/26: 95 degrees Goal status: MET  LONG TERM GOALS: Target date: 10/01/22  Patient will be independent with her advanced HEP.  Baseline:  Goal status: IN PROGRESS  2.  Patient will be able to demonstrate at least 115 degrees of right knee flexion for improved function navigating stairs.  Baseline:  Goal status: IN PROGRESS  3.  Patient will be able to navigate at least 80 feet with a single point cane for improved household mobility.  Baseline:  Goal status: MET  4.  Patient will be able to navigate at least 2 steps with a reciprocal pattern for improved household mobility.  Baseline:  Goal status: IN PROGRESS  PLAN:  PT FREQUENCY: 2x/week  PT DURATION: 6 weeks  PLANNED INTERVENTIONS: Therapeutic exercises, Therapeutic activity, Neuromuscular re-education, Balance training, Gait training, Patient/Family education, Self Care, Joint  mobilization, Stair training, Electrical stimulation, Cryotherapy, Moist heat, Vasopneumatic device, Manual therapy, and Re-evaluation  PLAN FOR NEXT SESSION: Nustep, lunges, and gait training with SPC  Adriann Thau P Banita Lehn, PTA 09/11/2022, 10:24 AM

## 2022-09-13 ENCOUNTER — Ambulatory Visit: Payer: Medicare PPO | Admitting: Physical Therapy

## 2022-09-13 ENCOUNTER — Encounter: Payer: Self-pay | Admitting: Physical Therapy

## 2022-09-13 DIAGNOSIS — M25561 Pain in right knee: Secondary | ICD-10-CM

## 2022-09-13 DIAGNOSIS — M25661 Stiffness of right knee, not elsewhere classified: Secondary | ICD-10-CM

## 2022-09-13 DIAGNOSIS — R600 Localized edema: Secondary | ICD-10-CM

## 2022-09-13 NOTE — Therapy (Signed)
OUTPATIENT PHYSICAL THERAPY LOWER EXTREMITY TREATMENT   Patient Name: [name] MRN: [id number] DOB:1945-11-23, 77 y.o., female Today's Date: 09/13/2022  END OF SESSION:  PT End of Session - 09/13/22 0942     Visit Number 8    Number of Visits 12    Date for PT Re-Evaluation 11/02/22    PT Start Time 0936    PT Stop Time 1000    PT Time Calculation (min) 24 min    Equipment Utilized During Treatment Other (comment)   SPC   Activity Tolerance Patient tolerated treatment well    Behavior During Therapy WFL for tasks assessed/performed            Past Medical History:  Diagnosis Date   Anemia    Arthritis    Rheumatoid, followed by Dr. Corliss Skains, knees & ankles    Cancer (HCC)    CHF (congestive heart failure) (HCC)    pt not aware of this   Constipation    GERD (gastroesophageal reflux disease)    H/O cardiovascular stress test 02/2014   seen by Dr. Jacinto Halim - told that she was cleared for surgery   Heart murmur    states she's never had any problems   Hypertension    Leg cramps    Shortness of breath dyspnea    with exertion   Sleep apnea    CPAP in use- QO night, study done at [facility]- 2007    Stroke [facility]) 03/2015   TIA   Past Surgical History:  Procedure Laterality Date   ABDOMINAL HYSTERECTOMY     CARPAL TUNNEL RELEASE Right    COLONOSCOPY     COLONOSCOPY WITH PROPOFOL N/A 11/13/2019   Procedure: COLONOSCOPY WITH PROPOFOL;  Surgeon: Jeani Hawking, MD;  Location: [facility];  Service: Endoscopy;  Laterality: N/A;   ESOPHAGOGASTRODUODENOSCOPY (EGD) WITH PROPOFOL N/A 11/13/2019   Procedure: ESOPHAGOGASTRODUODENOSCOPY (EGD) WITH PROPOFOL;  Surgeon: Jeani Hawking, MD;  Location: [facility];  Service: Endoscopy;  Laterality: N/A;   FOOT SURGERY Bilateral    bunionectomy, calluses , also has several pins & screws   HAND SURGERY     JOINT REPLACEMENT     planned for 03/25/2014   KNEE ARTHROPLASTY     KNEE SURGERY  1990's    LUMBAR LAMINECTOMY/DECOMPRESSION  MICRODISCECTOMY N/A 01/17/2015   Procedure: L2-3, L3-4 Decompression;  Surgeon: Eldred Manges, MD;  Location: [facility];  Service: Orthopedics;  Laterality: N/A;   LUMBAR LAMINECTOMY/DECOMPRESSION MICRODISCECTOMY N/A 02/06/2015   Procedure: I AND D LUMBAR WITH WOUND VAC PLACEMENT;  Surgeon: Eldred Manges, MD;  Location: [facility];  Service: Orthopedics;  Laterality: N/A;   MASTECTOMY Right 02/01/2020   ORIF TOE FRACTURE Right 07/30/2016   Procedure: OPEN REDUCTION INTERNAL FIXATION (ORIF) RIGHT 2ND METATARSAL (TOE) FRACTURE;  Surgeon: Eldred Manges, MD;  Location: [facility];  Service: Orthopedics;  Laterality: Right;   POLYPECTOMY  11/13/2019   Procedure: POLYPECTOMY;  Surgeon: Jeani Hawking, MD;  Location: [facility];  Service: Endoscopy;;   TOTAL KNEE ARTHROPLASTY Left 03/25/2014   Procedure: TOTAL KNEE ARTHROPLASTY;  Surgeon: Valeria Batman, MD;  Location: [facility] OR;  Service: Orthopedics;  Laterality: Left;   TUBAL LIGATION     Patient Active Problem List   Diagnosis Date Noted   Unilateral primary osteoarthritis, right knee 11/18/2018   Closed fracture of shaft of metatarsal bone 07/30/2016   Metatarsal fracture 07/30/2016   Displaced fracture of second metatarsal bone, right foot, initial encounter for closed fracture 07/26/2016  Arthritis of shoulder 04/10/2016   High risk medication use 03/16/2016   Primary osteoarthritis of both feet 03/16/2016   Primary osteoarthritis of both knees 03/16/2016   DDD (degenerative disc disease), lumbar 03/16/2016   OSA on CPAP 07/27/2015   Lip numbness 03/19/2015   TIA (transient ischemic attack) 03/19/2015   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HCC) 02/06/2015   Post op infection 02/05/2015   History of lumbar laminectomy for spinal cord decompression 01/17/2015   Lumbar spinal stenosis 01/17/2015   Primary osteoarthritis of left knee 03/25/2014   Rheumatoid arthritis of multiple sites with negative rheumatoid factor (HCC) 03/25/2014   History of total knee  replacement, left 03/25/2014   DERMATITIS 09/28/2008   CONSTIPATION 05/25/2008   DYSPNEA ON EXERTION 05/25/2008   ANEMIA, NORMOCYTIC 04/13/2008   ANKLE PAIN, BILATERAL 11/25/2007   HLD (hyperlipidemia) 08/14/2006   Morbid (severe) obesity due to excess calories (HCC) 12/13/2005   CARPAL TUNNEL SYNDROME 12/13/2005   PERIPHERAL NEUROPATHY 12/13/2005   Essential hypertension 12/13/2005   DYSRHYTHMIA, CARDIAC NOS 12/13/2005   PCP: Amil Amen, MD  REFERRING PROVIDER: Griffin Dakin, MD   REFERRING DIAG: Unilateral primary osteoarthritis, right knee; Aftercare following joint replacement surgery; Presence of right artificial knee joint   THERAPY DIAG:  Stiffness of right knee, not elsewhere classified  Localized edema  Acute pain of right knee  Rationale for Evaluation and Treatment: Rehabilitation  ONSET DATE: 08/02/22  SUBJECTIVE:   SUBJECTIVE STATEMENT: Reports a mild soreness upon arrival. Requests to leave by 10 am.  PERTINENT HISTORY: Hypertension, history of a TIA, peripheral neuropathy, osteoarthritis, rheumatoid arthritis, chronic low back pain, and history of cancer  PAIN:  Are you having pain? Yes: NPRS scale: 1-2/10 Pain location: R knee   Pain description: Sore Aggravating factors:   Relieving factors:      PRECAUTIONS: Fall  WEIGHT BEARING RESTRICTIONS: No  FALLS:  Has patient fallen in last 6 months? Yes. Number of falls 1; she was bending over to pick up something off her bathroom floor when she slipped and fell ([facility] 2024)  PATIENT GOALS: be able to walk with her cane, and improved knee mobility  NEXT MD VISIT: 09/18/22  OBJECTIVE:   PATIENT SURVEYS:  FOTO 45.26  COGNITION: Overall cognitive status: Within functional limits for tasks assessed     SENSATION: Patient reports no numbness or tingling  EDEMA:  Circumferential: Right tibiofemoral joint line: 54 cm Left tibiofemoral joint line: 45 cm  PALPATION: TTP:  right quadriceps, hip adductors, and gastrocnemius/soleus  LOWER EXTREMITY ROM:  Active ROM Right eval Left eval Right AROM 08/22/22 Right AROM 09/04/2022 Right AROM 09/11/22  Hip flexion       Hip extension       Hip abduction       Hip adduction       Hip internal rotation       Hip external rotation       Knee flexion 81/ 84 (PROM) 118 90 108 107  Knee extension 1 0 0 0   Ankle dorsiflexion       Ankle plantarflexion       Ankle inversion       Ankle eversion        (Blank rows = not tested)  LOWER EXTREMITY MMT: not tested due to surgical condition   GAIT: Assistive device utilized: Single point cane Level of assistance: Modified independence Comments: decreased gait speed, decreased knee flexion   TODAY'S TREATMENT:  DATE:09/13/22  EXERCISE LOG  Exercise Repetitions and Resistance Comments  Nustep L3 x 16 mins seat 7   Forward step up 6" step x20 reps   LAQ 4# x30 reps 5 sec holds   HS curl Seated x20 reps each; red theraband   NBOS on airex X2 min   Sidestepping  // bars x5 reps    PATIENT EDUCATION:  Education details: reviewed HH HEP, POC, prognosis, healing, edema, objective findings, and goals for therapy Person educated: Patient Education method: Explanation Education comprehension: verbalized understanding  HOME EXERCISE PROGRAM:  Lunges to stairs that she has access to. Was practiced in clinic on 09/04/22  ASSESSMENT:  CLINICAL IMPRESSION: Patient presented in clinic with reports of mild soreness. Patient able to complete all therex well and impressed herself with her balance activity on airex pad. Patient states that she has went down her larger set of stairs within her son's home but her son was in front of her. Patient had no other concerns for safety within her home or of frequent places she goes.  OBJECTIVE IMPAIRMENTS:  Abnormal gait, decreased activity tolerance, decreased mobility, difficulty walking, decreased ROM, decreased strength, increased edema, impaired tone, and pain.   ACTIVITY LIMITATIONS: carrying, lifting, standing, squatting, stairs, and locomotion level  PARTICIPATION LIMITATIONS: meal prep, cleaning, laundry, shopping, and community activity  PERSONAL FACTORS: 3+ comorbidities: Hypertension, history of a TIA, peripheral neuropathy, osteoarthritis, rheumatoid arthritis, chronic low back pain, and history of cancer  are also affecting patient's functional outcome.   REHAB POTENTIAL: Good  CLINICAL DECISION MAKING: Stable/uncomplicated  EVALUATION COMPLEXITY: Low  GOALS: Goals reviewed with patient? Yes  SHORT TERM GOALS: Target date: 09/10/22 Patient will be independent with her initial HEP.  Baseline: Goal status: MET  2.  Patient will be able to demonstrate at least 95 degrees of active right knee flexion for improve knee mobility.  Baseline: 8/26: 95 degrees Goal status: MET  LONG TERM GOALS: Target date: 10/01/22  Patient will be independent with her advanced HEP.  Baseline:  Goal status: IN PROGRESS  2.  Patient will be able to demonstrate at least 115 degrees of right knee flexion for improved function navigating stairs.  Baseline:  Goal status: IN PROGRESS  3.  Patient will be able to navigate at least 80 feet with a single point cane for improved household mobility.  Baseline:  Goal status: MET  4.  Patient will be able to navigate at least 2 steps with a reciprocal pattern for improved household mobility.  Baseline:  Goal status: IN PROGRESS  PLAN:  PT FREQUENCY: 2x/week  PT DURATION: 6 weeks  PLANNED INTERVENTIONS: Therapeutic exercises, Therapeutic activity, Neuromuscular re-education, Balance training, Gait training, Patient/Family education, Self Care, Joint mobilization, Stair training, Electrical stimulation, Cryotherapy, Moist heat, Vasopneumatic device,  Manual therapy, and Re-evaluation  PLAN FOR NEXT SESSION: Nustep, lunges, and gait training with SPC  Dalisha Shively P Iyanna Drummer, PTA 09/13/2022, 10:15 AM

## 2022-09-17 ENCOUNTER — Ambulatory Visit: Payer: Medicare PPO | Admitting: Physical Therapy

## 2022-09-17 DIAGNOSIS — M25661 Stiffness of right knee, not elsewhere classified: Secondary | ICD-10-CM

## 2022-09-17 DIAGNOSIS — R600 Localized edema: Secondary | ICD-10-CM

## 2022-09-17 NOTE — Therapy (Signed)
OUTPATIENT PHYSICAL THERAPY LOWER EXTREMITY TREATMENT   Patient Name: [name] MRN: [id number] DOB:[birth year], 77 y.o., female Today's Date: 09/17/2022  END OF SESSION:  PT End of Session - 09/17/22 1147     Visit Number 9    Number of Visits 12    Date for PT Re-Evaluation 11/02/22    PT Start Time 0930    PT Stop Time 1007    PT Time Calculation (min) 37 min    Activity Tolerance Patient tolerated treatment well    Behavior During Therapy WFL for tasks assessed/performed            Past Medical History:  Diagnosis Date   Anemia    Arthritis    Rheumatoid, followed by Dr. Corliss Skains, knees & ankles    Cancer (HCC)    CHF (congestive heart failure) (HCC)    pt not aware of this   Constipation    GERD (gastroesophageal reflux disease)    H/O cardiovascular stress test 02/2014   seen by Dr. Jacinto Halim - told that she was cleared for surgery   Heart murmur    states she's never had any problems   Hypertension    Leg cramps    Shortness of breath dyspnea    with exertion   Sleep apnea    CPAP in use- QO night, study done at [facility]- 2007    Stroke [facility]) 03/2015   TIA   Past Surgical History:  Procedure Laterality Date   ABDOMINAL HYSTERECTOMY     CARPAL TUNNEL RELEASE Right    COLONOSCOPY     COLONOSCOPY WITH PROPOFOL N/A 11/13/2019   Procedure: COLONOSCOPY WITH PROPOFOL;  Surgeon: Jeani Hawking, MD;  Location: [facility];  Service: Endoscopy;  Laterality: N/A;   ESOPHAGOGASTRODUODENOSCOPY (EGD) WITH PROPOFOL N/A 11/13/2019   Procedure: ESOPHAGOGASTRODUODENOSCOPY (EGD) WITH PROPOFOL;  Surgeon: Jeani Hawking, MD;  [facility]: [facility];  Service: Endoscopy;  Laterality: N/A;   FOOT SURGERY Bilateral    bunionectomy, calluses , also has several pins & screws   HAND SURGERY     JOINT REPLACEMENT     planned for 03/25/2014   KNEE ARTHROPLASTY     KNEE SURGERY  1990's    LUMBAR LAMINECTOMY/DECOMPRESSION MICRODISCECTOMY N/A 01/17/2015   Procedure: L2-3, L3-4  Decompression;  Surgeon: Eldred Manges, MD;  Location: [facility];  Service: Orthopedics;  Laterality: N/A;   LUMBAR LAMINECTOMY/DECOMPRESSION MICRODISCECTOMY N/A 02/06/2015   Procedure: I AND D LUMBAR WITH WOUND VAC PLACEMENT;  Surgeon: Eldred Manges, MD;  Location: [facility];  Service: Orthopedics;  Laterality: N/A;   MASTECTOMY Right 02/01/2020   ORIF TOE FRACTURE Right 07/30/2016   Procedure: OPEN REDUCTION INTERNAL FIXATION (ORIF) RIGHT 2ND METATARSAL (TOE) FRACTURE;  Surgeon: Eldred Manges, MD;  Location: [facility];  Service: Orthopedics;  Laterality: Right;   POLYPECTOMY  11/13/2019   Procedure: POLYPECTOMY;  Surgeon: Jeani Hawking, MD;  Location: [facility];  Service: Endoscopy;;   TOTAL KNEE ARTHROPLASTY Left 03/25/2014   Procedure: TOTAL KNEE ARTHROPLASTY;  Surgeon: Valeria Batman, MD;  Location: [facility] OR;  Service: Orthopedics;  Laterality: Left;   TUBAL LIGATION     Patient Active Problem List   Diagnosis Date Noted   Unilateral primary osteoarthritis, right knee 11/18/2018   Closed fracture of shaft of metatarsal bone 07/30/2016   Metatarsal fracture 07/30/2016   Displaced fracture of second metatarsal bone, right foot, initial encounter for closed fracture 07/26/2016   Arthritis of shoulder 04/10/2016   High risk medication use  03/16/2016   Primary osteoarthritis of both feet 03/16/2016   Primary osteoarthritis of both knees 03/16/2016   DDD (degenerative disc disease), lumbar 03/16/2016   OSA on CPAP 07/27/2015   Lip numbness 03/19/2015   TIA (transient ischemic attack) 03/19/2015   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HCC) 02/06/2015   Post op infection 02/05/2015   History of lumbar laminectomy for spinal cord decompression 01/17/2015   Lumbar spinal stenosis 01/17/2015   Primary osteoarthritis of left knee 03/25/2014   Rheumatoid arthritis of multiple sites with negative rheumatoid factor (HCC) 03/25/2014   History of total knee replacement, left 03/25/2014   DERMATITIS 09/28/2008    CONSTIPATION 05/25/2008   DYSPNEA ON EXERTION 05/25/2008   ANEMIA, NORMOCYTIC 04/13/2008   ANKLE PAIN, BILATERAL 11/25/2007   HLD (hyperlipidemia) 08/14/2006   Morbid (severe) obesity due to excess calories (HCC) 12/13/2005   CARPAL TUNNEL SYNDROME 12/13/2005   PERIPHERAL NEUROPATHY 12/13/2005   Essential hypertension 12/13/2005   DYSRHYTHMIA, CARDIAC NOS 12/13/2005   PCP: Amil Amen, MD  REFERRING PROVIDER: Griffin Dakin, MD   REFERRING DIAG: Unilateral primary osteoarthritis, right knee; Aftercare following joint replacement surgery; Presence of right artificial knee joint   THERAPY DIAG:  Stiffness of right knee, not elsewhere classified  Localized edema  Rationale for Evaluation and Treatment: Rehabilitation  ONSET DATE: 08/02/22  SUBJECTIVE:   SUBJECTIVE STATEMENT: No new complaints.  PERTINENT HISTORY: Hypertension, history of a TIA, peripheral neuropathy, osteoarthritis, rheumatoid arthritis, chronic low back pain, and history of cancer  PAIN:  Are you having pain? Yes: NPRS scale: 1-2/10 Pain location: R knee   Pain description: Sore Aggravating factors:   Relieving factors:      PRECAUTIONS: Fall  WEIGHT BEARING RESTRICTIONS: No  FALLS:  Has patient fallen in last 6 months? Yes. Number of falls 1; she was bending over to pick up something off her bathroom floor when she slipped and fell ([facility] 2024)  PATIENT GOALS: be able to walk with her cane, and improved knee mobility  NEXT MD VISIT: 09/18/22  OBJECTIVE:   PATIENT SURVEYS:  FOTO 45.26  COGNITION: Overall cognitive status: Within functional limits for tasks assessed     SENSATION: Patient reports no numbness or tingling  EDEMA:  Circumferential: Right tibiofemoral joint line: 54 cm Left tibiofemoral joint line: 45 cm  PALPATION: TTP: right quadriceps, hip adductors, and gastrocnemius/soleus  LOWER EXTREMITY ROM:  Active ROM Right eval Left eval Right  AROM 08/22/22 Right AROM 09/04/2022 Right AROM 09/11/22  Hip flexion       Hip extension       Hip abduction       Hip adduction       Hip internal rotation       Hip external rotation       Knee flexion 81/ 84 (PROM) 118 90 108 107  Knee extension 1 0 0 0   Ankle dorsiflexion       Ankle plantarflexion       Ankle inversion       Ankle eversion        (Blank rows = not tested)  LOWER EXTREMITY MMT: not tested due to surgical condition   GAIT: Assistive device utilized: Single point cane Level of assistance: Modified independence Comments: decreased gait speed, decreased knee flexion   TODAY'S TREATMENT:  DATE:  09/17/22:                                     EXERCISE LOG  Exercise Repetitions and Resistance Comments  Nustep  Level 3 x 15 minutes.   Knee ext machine 10# x 2 minutes.   Ham curl machine 30# x 3 minutes.           Vasopneumatic on low to patient's right knee x 15 minutes.    09/13/22  EXERCISE LOG  Exercise Repetitions and Resistance Comments  Nustep L3 x 16 mins seat 7   Forward step up 6" step x20 reps   LAQ 4# x30 reps 5 sec holds   HS curl Seated x20 reps each; red theraband   NBOS on airex X2 min   Sidestepping  // bars x5 reps    PATIENT EDUCATION:  Education details: reviewed HH HEP, POC, prognosis, healing, edema, objective findings, and goals for therapy Person educated: Patient Education method: Explanation Education comprehension: verbalized understanding  HOME EXERCISE PROGRAM:  Lunges to stairs that she has access to. Was practiced in clinic on 09/04/22  ASSESSMENT:  CLINICAL IMPRESSION: Patient is very motivated and did well with new interventions including knee extension machine (which she found challenging) and ham curl machine.  She is pleased with her progress.  OBJECTIVE IMPAIRMENTS: Abnormal gait,  decreased activity tolerance, decreased mobility, difficulty walking, decreased ROM, decreased strength, increased edema, impaired tone, and pain.   ACTIVITY LIMITATIONS: carrying, lifting, standing, squatting, stairs, and locomotion level  PARTICIPATION LIMITATIONS: meal prep, cleaning, laundry, shopping, and community activity  PERSONAL FACTORS: 3+ comorbidities: Hypertension, history of a TIA, peripheral neuropathy, osteoarthritis, rheumatoid arthritis, chronic low back pain, and history of cancer  are also affecting patient's functional outcome.   REHAB POTENTIAL: Good  CLINICAL DECISION MAKING: Stable/uncomplicated  EVALUATION COMPLEXITY: Low  GOALS: Goals reviewed with patient? Yes  SHORT TERM GOALS: Target date: 09/10/22 Patient will be independent with her initial HEP.  Baseline: Goal status: MET  2.  Patient will be able to demonstrate at least 95 degrees of active right knee flexion for improve knee mobility.  Baseline: 8/26: 95 degrees Goal status: MET  LONG TERM GOALS: Target date: 10/01/22  Patient will be independent with her advanced HEP.  Baseline:  Goal status: IN PROGRESS  2.  Patient will be able to demonstrate at least 115 degrees of right knee flexion for improved function navigating stairs.  Baseline:  Goal status: IN PROGRESS  3.  Patient will be able to navigate at least 80 feet with a single point cane for improved household mobility.  Baseline:  Goal status: MET  4.  Patient will be able to navigate at least 2 steps with a reciprocal pattern for improved household mobility.  Baseline:  Goal status: IN PROGRESS  PLAN:  PT FREQUENCY: 2x/week  PT DURATION: 6 weeks  PLANNED INTERVENTIONS: Therapeutic exercises, Therapeutic activity, Neuromuscular re-education, Balance training, Gait training, Patient/Family education, Self Care, Joint mobilization, Stair training, Electrical stimulation, Cryotherapy, Moist heat, Vasopneumatic device, Manual  therapy, and Re-evaluation  PLAN FOR NEXT SESSION: Nustep, lunges, and gait training with SPC  Verlie Liotta, [place], PT 09/17/2022, 11:52 AM

## 2022-09-19 ENCOUNTER — Encounter: Payer: Medicare PPO | Admitting: Physical Therapy

## 2022-09-20 ENCOUNTER — Ambulatory Visit: Payer: Medicare PPO | Admitting: *Deleted

## 2022-09-20 ENCOUNTER — Encounter: Payer: Self-pay | Admitting: *Deleted

## 2022-09-20 DIAGNOSIS — M25661 Stiffness of right knee, not elsewhere classified: Secondary | ICD-10-CM | POA: Diagnosis not present

## 2022-09-20 DIAGNOSIS — M25561 Pain in right knee: Secondary | ICD-10-CM

## 2022-09-20 DIAGNOSIS — R600 Localized edema: Secondary | ICD-10-CM

## 2022-09-20 NOTE — Therapy (Signed)
OUTPATIENT PHYSICAL THERAPY LOWER EXTREMITY TREATMENT   Patient Name: [name] MRN: [id number] DOB:10/29/1945, 77 y.o., female Today's Date: 09/20/2022  END OF SESSION:  PT End of Session - 09/20/22 1036     Visit Number 10    Number of Visits 12    Date for PT Re-Evaluation 11/02/22    PT Start Time 1015    PT Stop Time 1105    PT Time Calculation (min) 50 min            Past Medical History:  Diagnosis Date   Anemia    Arthritis    Rheumatoid, followed by Dr. Corliss Skains, knees & ankles    Cancer (HCC)    CHF (congestive heart failure) (HCC)    pt not aware of this   Constipation    GERD (gastroesophageal reflux disease)    H/O cardiovascular stress test 02/2014   seen by Dr. Jacinto Halim - told that she was cleared for surgery   Heart murmur    states she's never had any problems   Hypertension    Leg cramps    Shortness of breath dyspnea    with exertion   Sleep apnea    CPAP in use- QO night, study done at [facility]- 2007    Stroke [facility]) 03/2015   TIA   Past Surgical History:  Procedure Laterality Date   ABDOMINAL HYSTERECTOMY     CARPAL TUNNEL RELEASE Right    COLONOSCOPY     COLONOSCOPY WITH PROPOFOL N/A 11/13/2019   Procedure: COLONOSCOPY WITH PROPOFOL;  Surgeon: Jeani Hawking, MD;  Location: [facility];  Service: Endoscopy;  Laterality: N/A;   ESOPHAGOGASTRODUODENOSCOPY (EGD) WITH PROPOFOL N/A 11/13/2019   Procedure: ESOPHAGOGASTRODUODENOSCOPY (EGD) WITH PROPOFOL;  Surgeon: Jeani Hawking, MD;  Location: [facility];  Service: Endoscopy;  Laterality: N/A;   FOOT SURGERY Bilateral    bunionectomy, calluses , also has several pins & screws   HAND SURGERY     JOINT REPLACEMENT     planned for 03/25/2014   KNEE ARTHROPLASTY     KNEE SURGERY  1990's    LUMBAR LAMINECTOMY/DECOMPRESSION MICRODISCECTOMY N/A 01/17/2015   Procedure: L2-3, L3-4 Decompression;  Surgeon: Eldred Manges, MD;  Location: [facility];  Service: Orthopedics;  Laterality: N/A;   LUMBAR  LAMINECTOMY/DECOMPRESSION MICRODISCECTOMY N/A 02/06/2015   Procedure: I AND D LUMBAR WITH WOUND VAC PLACEMENT;  Surgeon: Eldred Manges, MD;  Location: [facility] OR;  Service: Orthopedics;  Laterality: N/A;   MASTECTOMY Right 02/01/2020   ORIF TOE FRACTURE Right 07/30/2016   Procedure: OPEN REDUCTION INTERNAL FIXATION (ORIF) RIGHT 2ND METATARSAL (TOE) FRACTURE;  Surgeon: Eldred Manges, MD;  Location: [facility];  Service: Orthopedics;  Laterality: Right;   POLYPECTOMY  11/13/2019   Procedure: POLYPECTOMY;  Surgeon: Jeani Hawking, MD;  [facility]: [facility];  Service: Endoscopy;;   TOTAL KNEE ARTHROPLASTY Left 03/25/2014   Procedure: TOTAL KNEE ARTHROPLASTY;  Surgeon: Valeria Batman, MD;  Location: [facility] OR;  Service: Orthopedics;  Laterality: Left;   TUBAL LIGATION     Patient Active Problem List   Diagnosis Date Noted   Unilateral primary osteoarthritis, right knee 11/18/2018   Closed fracture of shaft of metatarsal bone 07/30/2016   Metatarsal fracture 07/30/2016   Displaced fracture of second metatarsal bone, right foot, initial encounter for closed fracture 07/26/2016   Arthritis of shoulder 04/10/2016   High risk medication use 03/16/2016   Primary osteoarthritis of both feet 03/16/2016   Primary osteoarthritis of both knees 03/16/2016  DDD (degenerative disc disease), lumbar 03/16/2016   OSA on CPAP 07/27/2015   Lip numbness 03/19/2015   TIA (transient ischemic attack) 03/19/2015   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HCC) 02/06/2015   Post op infection 02/05/2015   History of lumbar laminectomy for spinal cord decompression 01/17/2015   Lumbar spinal stenosis 01/17/2015   Primary osteoarthritis of left knee 03/25/2014   Rheumatoid arthritis of multiple sites with negative rheumatoid factor (HCC) 03/25/2014   History of total knee replacement, left 03/25/2014   DERMATITIS 09/28/2008   CONSTIPATION 05/25/2008   DYSPNEA ON EXERTION 05/25/2008   ANEMIA, NORMOCYTIC 04/13/2008   ANKLE PAIN, BILATERAL  11/25/2007   HLD (hyperlipidemia) 08/14/2006   Morbid (severe) obesity due to excess calories (HCC) 12/13/2005   CARPAL TUNNEL SYNDROME 12/13/2005   PERIPHERAL NEUROPATHY 12/13/2005   Essential hypertension 12/13/2005   DYSRHYTHMIA, CARDIAC NOS 12/13/2005   PCP: Amil Amen, MD  REFERRING PROVIDER: Griffin Dakin, MD   REFERRING DIAG: Unilateral primary osteoarthritis, right knee; Aftercare following joint replacement surgery; Presence of right artificial knee joint   THERAPY DIAG:  Stiffness of right knee, not elsewhere classified  Localized edema  Acute pain of right knee  Rationale for Evaluation and Treatment: Rehabilitation  ONSET DATE: 08/02/22  SUBJECTIVE:   SUBJECTIVE STATEMENT: No new complaints.  PERTINENT HISTORY: Hypertension, history of a TIA, peripheral neuropathy, osteoarthritis, rheumatoid arthritis, chronic low back pain, and history of cancer  PAIN:  Are you having pain? Yes: NPRS scale: 1-2/10 Pain location: R knee   Pain description: Sore Aggravating factors:   Relieving factors:      PRECAUTIONS: Fall  WEIGHT BEARING RESTRICTIONS: No  FALLS:  Has patient fallen in last 6 months? Yes. Number of falls 1; she was bending over to pick up something off her bathroom floor when she slipped and fell ([facility] 2024)  PATIENT GOALS: be able to walk with her cane, and improved knee mobility  NEXT MD VISIT: 09/18/22  OBJECTIVE:   PATIENT SURVEYS:  FOTO 45.26  COGNITION: Overall cognitive status: Within functional limits for tasks assessed     SENSATION: Patient reports no numbness or tingling  EDEMA:  Circumferential: Right tibiofemoral joint line: 54 cm Left tibiofemoral joint line: 45 cm  PALPATION: TTP: right quadriceps, hip adductors, and gastrocnemius/soleus  LOWER EXTREMITY ROM:  Active ROM Right eval Left eval Right AROM 08/22/22 Right AROM 09/04/2022 Right AROM 09/11/22  Hip flexion       Hip extension        Hip abduction       Hip adduction       Hip internal rotation       Hip external rotation       Knee flexion 81/ 84 (PROM) 118 90 108 107  Knee extension 1 0 0 0   Ankle dorsiflexion       Ankle plantarflexion       Ankle inversion       Ankle eversion        (Blank rows = not tested)  LOWER EXTREMITY MMT: not tested due to surgical condition   GAIT: Assistive device utilized: Single point cane Level of assistance: Modified independence Comments: decreased gait speed, decreased knee flexion   TODAY'S TREATMENT:  DATE:  09/20/22:                                     EXERCISE LOG  Exercise Repetitions and Resistance Comments  Nustep  Level 3 x 15 minutes.   Knee ext machine 10# x 3 minutes.   Ham curl machine 30# x 3 minutes.           Vasopneumatic on low to patient's right knee x 15 minutes. Manual: PROM  to RT knee for flexion progression with end-range holds 108 degrees.    09/13/22  EXERCISE LOG  Exercise Repetitions and Resistance Comments  Nustep L3 x 16 mins seat 7   Forward step up 6" step x20 reps   LAQ 4# x30 reps 5 sec holds   HS curl Seated x20 reps each; red theraband   NBOS on airex X2 min   Sidestepping  // bars x5 reps    PATIENT EDUCATION:  Education details: reviewed HH HEP, POC, prognosis, healing, edema, objective findings, and goals for therapy Person educated: Patient Education method: Explanation Education comprehension: verbalized understanding  HOME EXERCISE PROGRAM:  Lunges to stairs that she has access to. Was practiced in clinic on 09/04/22  ASSESSMENT:  CLINICAL IMPRESSION: Patient  arrived today in good spirits and was able to continue with therex for strengthening and ROM progression. She did great again today and ROM  was 108 degrees. Vaso end of session.  OBJECTIVE IMPAIRMENTS: Abnormal gait, decreased  activity tolerance, decreased mobility, difficulty walking, decreased ROM, decreased strength, increased edema, impaired tone, and pain.   ACTIVITY LIMITATIONS: carrying, lifting, standing, squatting, stairs, and locomotion level  PARTICIPATION LIMITATIONS: meal prep, cleaning, laundry, shopping, and community activity  PERSONAL FACTORS: 3+ comorbidities: Hypertension, history of a TIA, peripheral neuropathy, osteoarthritis, rheumatoid arthritis, chronic low back pain, and history of cancer  are also affecting patient's functional outcome.   REHAB POTENTIAL: Good  CLINICAL DECISION MAKING: Stable/uncomplicated  EVALUATION COMPLEXITY: Low  GOALS: Goals reviewed with patient? Yes  SHORT TERM GOALS: Target date: 09/10/22 Patient will be independent with her initial HEP.  Baseline: Goal status: MET  2.  Patient will be able to demonstrate at least 95 degrees of active right knee flexion for improve knee mobility.  Baseline: 8/26: 95 degrees Goal status: MET  LONG TERM GOALS: Target date: 10/01/22  Patient will be independent with her advanced HEP.  Baseline:  Goal status: IN PROGRESS  2.  Patient will be able to demonstrate at least 115 degrees of right knee flexion for improved function navigating stairs.  Baseline:  Goal status: IN PROGRESS  3.  Patient will be able to navigate at least 80 feet with a single point cane for improved household mobility.  Baseline:  Goal status: MET  4.  Patient will be able to navigate at least 2 steps with a reciprocal pattern for improved household mobility.  Baseline:  Goal status: IN PROGRESS  PLAN:  PT FREQUENCY: 2x/week  PT DURATION: 6 weeks  PLANNED INTERVENTIONS: Therapeutic exercises, Therapeutic activity, Neuromuscular re-education, Balance training, Gait training, Patient/Family education, Self Care, Joint mobilization, Stair training, Electrical stimulation, Cryotherapy, Moist heat, Vasopneumatic device, Manual therapy, and  Re-evaluation  PLAN FOR NEXT SESSION: Nustep, lunges, and gait training with SPC  Rickardo Brinegar,CHRIS, PTA 09/20/2022, 11:17 AM

## 2022-09-21 ENCOUNTER — Other Ambulatory Visit: Payer: Self-pay | Admitting: Physician Assistant

## 2022-09-21 ENCOUNTER — Other Ambulatory Visit (HOSPITAL_COMMUNITY): Payer: Self-pay

## 2022-09-21 DIAGNOSIS — M0609 Rheumatoid arthritis without rheumatoid factor, multiple sites: Secondary | ICD-10-CM

## 2022-09-21 NOTE — Telephone Encounter (Signed)
Last Fill: 06/06/2022  Labs: 06/12/2022 GFR 53, Alk. Phos 114, RBC 3.98, Hgb 11.8, Hct 34.8  TB Gold: 01/10/2022 Neg   Next Visit: 09/24/2022  Last Visit: 03/23/2022  [id number] arthritis of multiple sites with negative rheumatoid factor   Current Dose per office note 03/23/2022: Orencia 125 mg sq injections every 7 days.  Patient to update labs at upcoming appointment on 09/24/2022  Okay to refill Orencia?

## 2022-09-24 ENCOUNTER — Ambulatory Visit: Payer: Medicare PPO | Admitting: *Deleted

## 2022-09-24 ENCOUNTER — Other Ambulatory Visit: Payer: Self-pay

## 2022-09-24 ENCOUNTER — Encounter: Payer: Self-pay | Admitting: *Deleted

## 2022-09-24 ENCOUNTER — Encounter: Payer: Self-pay | Admitting: Rheumatology

## 2022-09-24 ENCOUNTER — Ambulatory Visit: Payer: Medicare PPO | Attending: Rheumatology | Admitting: Rheumatology

## 2022-09-24 ENCOUNTER — Other Ambulatory Visit (HOSPITAL_COMMUNITY): Payer: Self-pay

## 2022-09-24 VITALS — BP 135/82 | HR 88 | Resp 16 | Ht 59.0 in | Wt 197.0 lb

## 2022-09-24 DIAGNOSIS — Z79899 Other long term (current) drug therapy: Secondary | ICD-10-CM | POA: Diagnosis not present

## 2022-09-24 DIAGNOSIS — Z8639 Personal history of other endocrine, nutritional and metabolic disease: Secondary | ICD-10-CM

## 2022-09-24 DIAGNOSIS — Z8669 Personal history of other diseases of the nervous system and sense organs: Secondary | ICD-10-CM

## 2022-09-24 DIAGNOSIS — M0609 Rheumatoid arthritis without rheumatoid factor, multiple sites: Secondary | ICD-10-CM

## 2022-09-24 DIAGNOSIS — M25661 Stiffness of right knee, not elsewhere classified: Secondary | ICD-10-CM

## 2022-09-24 DIAGNOSIS — R600 Localized edema: Secondary | ICD-10-CM

## 2022-09-24 DIAGNOSIS — M1711 Unilateral primary osteoarthritis, right knee: Secondary | ICD-10-CM

## 2022-09-24 DIAGNOSIS — Z5181 Encounter for therapeutic drug level monitoring: Secondary | ICD-10-CM | POA: Diagnosis not present

## 2022-09-24 DIAGNOSIS — Z8679 Personal history of other diseases of the circulatory system: Secondary | ICD-10-CM

## 2022-09-24 DIAGNOSIS — M65331 Trigger finger, right middle finger: Secondary | ICD-10-CM | POA: Diagnosis not present

## 2022-09-24 DIAGNOSIS — M5136 Other intervertebral disc degeneration, lumbar region: Secondary | ICD-10-CM

## 2022-09-24 DIAGNOSIS — M19071 Primary osteoarthritis, right ankle and foot: Secondary | ICD-10-CM

## 2022-09-24 DIAGNOSIS — M25561 Pain in right knee: Secondary | ICD-10-CM

## 2022-09-24 DIAGNOSIS — C50911 Malignant neoplasm of unspecified site of right female breast: Secondary | ICD-10-CM

## 2022-09-24 DIAGNOSIS — Z96653 Presence of artificial knee joint, bilateral: Secondary | ICD-10-CM

## 2022-09-24 DIAGNOSIS — M19072 Primary osteoarthritis, left ankle and foot: Secondary | ICD-10-CM

## 2022-09-24 DIAGNOSIS — Z8673 Personal history of transient ischemic attack (TIA), and cerebral infarction without residual deficits: Secondary | ICD-10-CM

## 2022-09-24 MED ORDER — ORENCIA CLICKJECT 125 MG/ML ~~LOC~~ SOAJ
125.0000 mg | SUBCUTANEOUS | 0 refills | Status: DC
Start: 2022-09-24 — End: 2022-12-11
  Filled 2022-09-24: qty 4, 28d supply, fill #0
  Filled 2022-10-23: qty 4, 28d supply, fill #1
  Filled 2022-11-13: qty 4, 28d supply, fill #2

## 2022-09-24 NOTE — Therapy (Signed)
OUTPATIENT PHYSICAL THERAPY LOWER EXTREMITY TREATMENT   Patient Name: [name] MRN: [id number] DOB:[birth year], 77 y.o., female Today's Date: 09/24/2022  END OF SESSION:  PT End of Session - 09/24/22 0855     Visit Number 11    Number of Visits 12    Date for PT Re-Evaluation 11/02/22    PT Start Time 0845    PT Stop Time 0932    PT Time Calculation (min) 47 min            Past Medical History:  Diagnosis Date   Anemia    Arthritis    Rheumatoid, followed by Dr. Corliss Skains, knees & ankles    Cancer (HCC)    CHF (congestive heart failure) (HCC)    pt not aware of this   Constipation    GERD (gastroesophageal reflux disease)    H/O cardiovascular stress test 02/2014   seen by Dr. Jacinto Halim - told that she was cleared for surgery   Heart murmur    states she's never had any problems   Hypertension    Leg cramps    Shortness of breath dyspnea    with exertion   Sleep apnea    CPAP in use- QO night, study done at [facility]- 2007    Stroke [facility]) 03/2015   TIA   Past Surgical History:  Procedure Laterality Date   ABDOMINAL HYSTERECTOMY     CARPAL TUNNEL RELEASE Right    COLONOSCOPY     COLONOSCOPY WITH PROPOFOL N/A 11/13/2019   Procedure: COLONOSCOPY WITH PROPOFOL;  Surgeon: Jeani Hawking, MD;  Location: [facility];  Service: Endoscopy;  Laterality: N/A;   ESOPHAGOGASTRODUODENOSCOPY (EGD) WITH PROPOFOL N/A 11/13/2019   Procedure: ESOPHAGOGASTRODUODENOSCOPY (EGD) WITH PROPOFOL;  Surgeon: Jeani Hawking, MD;  Location: [facility];  Service: Endoscopy;  Laterality: N/A;   FOOT SURGERY Bilateral    bunionectomy, calluses , also has several pins & screws   HAND SURGERY     JOINT REPLACEMENT     planned for 03/25/2014   KNEE ARTHROPLASTY     KNEE SURGERY  1990's    LUMBAR LAMINECTOMY/DECOMPRESSION MICRODISCECTOMY N/A 01/17/2015   Procedure: L2-3, L3-4 Decompression;  Surgeon: Eldred Manges, MD;  Location: [facility];  Service: Orthopedics;  Laterality: N/A;   LUMBAR  LAMINECTOMY/DECOMPRESSION MICRODISCECTOMY N/A 02/06/2015   Procedure: I AND D LUMBAR WITH WOUND VAC PLACEMENT;  Surgeon: Eldred Manges, MD;  Location: [facility];  Service: Orthopedics;  Laterality: N/A;   MASTECTOMY Right 02/01/2020   ORIF TOE FRACTURE Right 07/30/2016   Procedure: OPEN REDUCTION INTERNAL FIXATION (ORIF) RIGHT 2ND METATARSAL (TOE) FRACTURE;  Surgeon: Eldred Manges, MD;  Location: [facility];  Service: Orthopedics;  Laterality: Right;   POLYPECTOMY  11/13/2019   Procedure: POLYPECTOMY;  Surgeon: Jeani Hawking, MD;  [facility]: [facility];  Service: Endoscopy;;   TOTAL KNEE ARTHROPLASTY Left 03/25/2014   Procedure: TOTAL KNEE ARTHROPLASTY;  Surgeon: Valeria Batman, MD;  Location: [facility] OR;  Service: Orthopedics;  Laterality: Left;   TUBAL LIGATION     Patient Active Problem List   Diagnosis Date Noted   Unilateral primary osteoarthritis, right knee 11/18/2018   Closed fracture of shaft of metatarsal bone 07/30/2016   Metatarsal fracture 07/30/2016   Displaced fracture of second metatarsal bone, right foot, initial encounter for closed fracture 07/26/2016   Arthritis of shoulder 04/10/2016   High risk medication use 03/16/2016   Primary osteoarthritis of both feet 03/16/2016   Primary osteoarthritis of both knees 03/16/2016  DDD (degenerative disc disease), lumbar 03/16/2016   OSA on CPAP 07/27/2015   Lip numbness 03/19/2015   TIA (transient ischemic attack) 03/19/2015   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HCC) 02/06/2015   Post op infection 02/05/2015   History of lumbar laminectomy for spinal cord decompression 01/17/2015   Lumbar spinal stenosis 01/17/2015   Primary osteoarthritis of left knee 03/25/2014   Rheumatoid arthritis of multiple sites with negative rheumatoid factor (HCC) 03/25/2014   History of total knee replacement, left 03/25/2014   DERMATITIS 09/28/2008   CONSTIPATION 05/25/2008   DYSPNEA ON EXERTION 05/25/2008   ANEMIA, NORMOCYTIC 04/13/2008   ANKLE PAIN, BILATERAL  11/25/2007   HLD (hyperlipidemia) 08/14/2006   Morbid (severe) obesity due to excess calories (HCC) 12/13/2005   CARPAL TUNNEL SYNDROME 12/13/2005   PERIPHERAL NEUROPATHY 12/13/2005   Essential hypertension 12/13/2005   DYSRHYTHMIA, CARDIAC NOS 12/13/2005   PCP: Amil Amen, MD  REFERRING PROVIDER: Griffin Dakin, MD   REFERRING DIAG: Unilateral primary osteoarthritis, right knee; Aftercare following joint replacement surgery; Presence of right artificial knee joint   THERAPY DIAG:  Stiffness of right knee, not elsewhere classified  Localized edema  Acute pain of right knee  Rationale for Evaluation and Treatment: Rehabilitation  ONSET DATE: 08/02/22  SUBJECTIVE:   SUBJECTIVE STATEMENT: No new complaints. RT knee 2/10. Ready to DC after next visit  PERTINENT HISTORY: Hypertension, history of a TIA, peripheral neuropathy, osteoarthritis, rheumatoid arthritis, chronic low back pain, and history of cancer  PAIN:  Are you having pain? Yes: NPRS scale: 1-2/10 Pain location: R knee   Pain description: Sore Aggravating factors:   Relieving factors:      PRECAUTIONS: Fall  WEIGHT BEARING RESTRICTIONS: No  FALLS:  Has patient fallen in last 6 months? Yes. Number of falls 1; she was bending over to pick up something off her bathroom floor when she slipped and fell ([facility] 2024)  PATIENT GOALS: be able to walk with her cane, and improved knee mobility  NEXT MD VISIT: 09/18/22  OBJECTIVE:   PATIENT SURVEYS:  FOTO 45.26  COGNITION: Overall cognitive status: Within functional limits for tasks assessed     SENSATION: Patient reports no numbness or tingling  EDEMA:  Circumferential: Right tibiofemoral joint line: 54 cm Left tibiofemoral joint line: 45 cm  PALPATION: TTP: right quadriceps, hip adductors, and gastrocnemius/soleus  LOWER EXTREMITY ROM:  Active ROM Right eval Left eval Right AROM 08/22/22 Right AROM 09/04/2022 Right  AROM 09/11/22  Hip flexion       Hip extension       Hip abduction       Hip adduction       Hip internal rotation       Hip external rotation       Knee flexion 81/ 84 (PROM) 118 90 108 107  Knee extension 1 0 0 0   Ankle dorsiflexion       Ankle plantarflexion       Ankle inversion       Ankle eversion        (Blank rows = not tested)  LOWER EXTREMITY MMT: not tested due to surgical condition   GAIT: Assistive device utilized: Single point cane Level of assistance: Modified independence Comments: decreased gait speed, decreased knee flexion   TODAY'S TREATMENT:  DATE:  09/24/22:                                     EXERCISE LOG   RT knee  Exercise Repetitions and Resistance Comments  Nustep  Level 3 x 16 minutes.   Knee ext machine    Ham curl machine    Rocker board  X 6 mins   Step up 6 in    LAQ 5# 3x10 pause at top   HS curl Green 3x15   Vasopneumatic  Manual:  Discussed and reviewed  HEP Gait with SPC in clinic  09/13/22  EXERCISE LOG  Exercise Repetitions and Resistance Comments  Nustep L3 x 16 mins seat 7   Forward step up 6" step x20 reps   LAQ 4# x30 reps 5 sec holds   HS curl Seated x20 reps each; red theraband   NBOS on airex X2 min   Sidestepping  // bars x5 reps    PATIENT EDUCATION:  Education details: reviewed HH HEP, POC, prognosis, healing, edema, objective findings, and goals for therapy Person educated: Patient Education method: Explanation Education comprehension: verbalized understanding  HOME EXERCISE PROGRAM:  Lunges to stairs that she has access to. Was practiced in clinic on 09/04/22  ASSESSMENT:  CLINICAL IMPRESSION: Patient  arrived today in good spirits and was able to continue with therex for strengthening and ROM progression as well as balance. She did great again today and ROM  was 108 degrees. DC after  next visit  OBJECTIVE IMPAIRMENTS: Abnormal gait, decreased activity tolerance, decreased mobility, difficulty walking, decreased ROM, decreased strength, increased edema, impaired tone, and pain.   ACTIVITY LIMITATIONS: carrying, lifting, standing, squatting, stairs, and locomotion level  PARTICIPATION LIMITATIONS: meal prep, cleaning, laundry, shopping, and community activity  PERSONAL FACTORS: 3+ comorbidities: Hypertension, history of a TIA, peripheral neuropathy, osteoarthritis, rheumatoid arthritis, chronic low back pain, and history of cancer  are also affecting patient's functional outcome.   REHAB POTENTIAL: Good  CLINICAL DECISION MAKING: Stable/uncomplicated  EVALUATION COMPLEXITY: Low  GOALS: Goals reviewed with patient? Yes  SHORT TERM GOALS: Target date: 09/10/22 Patient will be independent with her initial HEP.  Baseline: Goal status: MET  2.  Patient will be able to demonstrate at least 95 degrees of active right knee flexion for improve knee mobility.  Baseline: 8/26: 95 degrees Goal status: MET  LONG TERM GOALS: Target date: 10/01/22  Patient will be independent with her advanced HEP.  Baseline:  Goal status: IN PROGRESS  2.  Patient will be able to demonstrate at least 115 degrees of right knee flexion for improved function navigating stairs.  Baseline:  Goal status: IN PROGRESS  3.  Patient will be able to navigate at least 80 feet with a single point cane for improved household mobility.  Baseline:  Goal status: MET  4.  Patient will be able to navigate at least 2 steps with a reciprocal pattern for improved household mobility.  Baseline:  Goal status: IN PROGRESS  PLAN:  PT FREQUENCY: 2x/week  PT DURATION: 6 weeks  PLANNED INTERVENTIONS: Therapeutic exercises, Therapeutic activity, Neuromuscular re-education, Balance training, Gait training, Patient/Family education, Self Care, Joint mobilization, Stair training, Electrical stimulation,  Cryotherapy, Moist heat, Vasopneumatic device, Manual therapy, and Re-evaluation  PLAN FOR NEXT SESSION: DC after next visit  Tijuan Dantes,CHRIS, PTA 09/24/2022, 9:47 AM

## 2022-09-24 NOTE — Patient Instructions (Signed)
Standing Labs We placed an order today for your standing lab work.   Please have your standing labs drawn in December and every 3 months  Please have your labs drawn 2 weeks prior to your appointment so that the provider can discuss your lab results at your appointment, if possible.  Please note that you may see your imaging and lab results in MyChart before we have reviewed them. We will contact you once all results are reviewed. Please allow our office up to 72 hours to thoroughly review all of the results before contacting the office for clarification of your results.  WALK-IN LAB HOURS  Monday through Thursday from 8:00 am -12:30 pm and 1:00 pm-5:00 pm and Friday from 8:00 am-12:00 pm.  Patients with office visits requiring labs will be seen before walk-in labs.  You may encounter longer than normal wait times. Please allow additional time. Wait times may be shorter on  Monday and Thursday afternoons.  We do not book appointments for walk-in labs. We appreciate your patience and understanding with our staff.   Labs are drawn by [facility]. Please bring your co-pay at the time of your lab draw.  You may receive a bill from [facility] for your lab work.  Please note if you are on Hydroxychloroquine and and an order has been placed for a Hydroxychloroquine level,  you will need to have it drawn 4 hours or more after your last dose.  If you wish to have your labs drawn at another location, please call the office 24 hours in advance so we can fax the orders.  The office is located at [address], [place], [place] [address]   If you have any questions regarding directions or hours of operation,  please call [phone].   As a reminder, please drink plenty of water prior to coming for your lab work. Thanks!   Vaccines You are taking a medication(s) that can suppress your immune system.  The following immunizations are recommended: Flu annually Covid-19  RSV Td/Tdap (tetanus,  diphtheria, pertussis) every 10 years Pneumonia (Prevnar 15 then Pneumovax 23 at least 1 year apart.  Alternatively, can take Prevnar 20 without needing additional dose) Shingrix: 2 doses from 4 weeks to 6 months apart If you have signs or symptoms of an infection or start antibiotics: First, call your PCP for workup of your infection. Hold your medication through the infection, until you complete your antibiotics, and until symptoms resolve if you take the following: Injectable medication (Actemra, Benlysta, Cimzia, Cosentyx, Enbrel, Humira, Kevzara, Orencia, Remicade, Simponi, Stelara, Taltz, Tremfya) Methotrexate Leflunomide (Arava) Mycophenolate (Cellcept) Osborne [place], or Rinvoq   Please check with your PCP to make sure you are up to date.

## 2022-09-24 NOTE — Progress Notes (Signed)
Office Visit Note  Patient: [name]             Date of Birth: 03-15-1945           MRN: [id number]             PCP: Amil Amen, MD Referring: Amil Amen, MD Visit Date: 09/24/2022 Occupation: @GUAROCC @  Subjective:  Medication management  History of Present Illness: [name] is a 77 y.o. female with seronegative rheumatoid arthritis, osteoarthritis and degenerative disc disease.  She states she had right total knee replacement on August 02, 2022.  Stopped Orencia prior to the surgery and restarted about 2 weeks ago.  Did not develop a flare of rheumatoid arthritis.  He denies any joint pain or discomfort today.  She has been followed by Dr. Christell Constant for the knee joint.  She continues to use rollator walker which helps.  She has been having intermittent triggering of bilateral middle fingers.  Denies any discomfort in her feet.  She has intermittent discomfort in her lower back.  She had a fluid COVID-19 booster on September 18, 2022.  She continues to take tramadol 1 tablet twice daily for lower back pain.  She states the pain is not controlled without tramadol.    Activities of Daily Living:  Patient reports morning stiffness for 10-15 minutes.   Patient Reports nocturnal pain.  Difficulty dressing/grooming: Reports Difficulty climbing stairs: Reports Difficulty getting out of chair: Reports Difficulty using hands for taps, buttons, cutlery, and/or writing: Reports  Review of Systems  Constitutional:  Negative for fatigue.  HENT:  Positive for mouth dryness. Negative for mouth sores.   Eyes:  Negative for dryness.  Respiratory:  Negative for difficulty breathing.   Cardiovascular:  Negative for chest pain and palpitations.  Gastrointestinal:  Positive for constipation. Negative for blood in stool and diarrhea.  Endocrine: Positive for increased urination.  Genitourinary:  Negative for involuntary urination.  Musculoskeletal:  Positive for gait  problem, morning stiffness and muscle tenderness. Negative for joint pain, joint pain, joint swelling, myalgias, muscle weakness and myalgias.  Skin:  Negative for color change, rash, hair loss and sensitivity to sunlight.  Allergic/Immunologic: Negative for susceptible to infections.  Neurological:  Negative for dizziness and headaches.  Hematological:  Negative for swollen glands.  Psychiatric/Behavioral:  Negative for depressed mood and sleep disturbance. The patient is not nervous/anxious.     PMFS History:  Patient Active Problem List   Diagnosis Date Noted   Unilateral primary osteoarthritis, right knee 11/18/2018   Closed fracture of shaft of metatarsal bone 07/30/2016   Metatarsal fracture 07/30/2016   Displaced fracture of second metatarsal bone, right foot, initial encounter for closed fracture 07/26/2016   Arthritis of shoulder 04/10/2016   High risk medication use 03/16/2016   Primary osteoarthritis of both feet 03/16/2016   Primary osteoarthritis of both knees 03/16/2016   DDD (degenerative disc disease), lumbar 03/16/2016   OSA on CPAP 07/27/2015   Lip numbness 03/19/2015   TIA (transient ischemic attack) 03/19/2015   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HCC) 02/06/2015   Post op infection 02/05/2015   History of lumbar laminectomy for spinal cord decompression 01/17/2015   Lumbar spinal stenosis 01/17/2015   Primary osteoarthritis of left knee 03/25/2014   Rheumatoid arthritis of multiple sites with negative rheumatoid factor (HCC) 03/25/2014   History of total knee replacement, left 03/25/2014   DERMATITIS 09/28/2008   CONSTIPATION 05/25/2008   DYSPNEA ON EXERTION 05/25/2008   ANEMIA, NORMOCYTIC  04/13/2008   ANKLE PAIN, BILATERAL 11/25/2007   HLD (hyperlipidemia) 08/14/2006   Morbid (severe) obesity due to excess calories (HCC) 12/13/2005   CARPAL TUNNEL SYNDROME 12/13/2005   PERIPHERAL NEUROPATHY 12/13/2005   Essential hypertension 12/13/2005   DYSRHYTHMIA, CARDIAC NOS  12/13/2005    Past Medical History:  Diagnosis Date   Anemia    Arthritis    Rheumatoid, followed by Dr. Corliss Skains, knees & ankles    Cancer (HCC)    CHF (congestive heart failure) (HCC)    pt not aware of this   Constipation    GERD (gastroesophageal reflux disease)    H/O cardiovascular stress test 02/2014   seen by Dr. Jacinto Halim - told that she was cleared for surgery   Heart murmur    states she's never had any problems   Hypertension    Leg cramps    Shortness of breath dyspnea    with exertion   Sleep apnea    CPAP in use- QO night, study done at [facility]- 2007    Stroke [facility]) 03/2015   TIA    Family History  Problem Relation Age of Onset   Seizures Mother    Transient ischemic attack Mother    Hypertension Mother    Diabetes Mother    Congestive Heart Failure Mother    Arthritis/Rheumatoid Mother    Parkinson's disease Mother    Dementia Mother    Heart disease Father        CHF   Stroke Sister    Depression Sister    Arthritis Brother    Arthritis Brother    Arthritis Son    Hypertension Son    Past Surgical History:  Procedure Laterality Date   ABDOMINAL HYSTERECTOMY     CARPAL TUNNEL RELEASE Right    COLONOSCOPY     COLONOSCOPY WITH PROPOFOL N/A 11/13/2019   Procedure: COLONOSCOPY WITH PROPOFOL;  Surgeon: Jeani Hawking, MD;  Location: [facility];  Service: Endoscopy;  Laterality: N/A;   ESOPHAGOGASTRODUODENOSCOPY (EGD) WITH PROPOFOL N/A 11/13/2019   Procedure: ESOPHAGOGASTRODUODENOSCOPY (EGD) WITH PROPOFOL;  Surgeon: Jeani Hawking, MD;  Location: [facility];  Service: Endoscopy;  Laterality: N/A;   FOOT SURGERY Bilateral    bunionectomy, calluses , also has several pins & screws   HAND SURGERY     JOINT REPLACEMENT     planned for 03/25/2014   KNEE ARTHROPLASTY     KNEE SURGERY  1990's    LUMBAR LAMINECTOMY/DECOMPRESSION MICRODISCECTOMY N/A 01/17/2015   Procedure: L2-3, L3-4 Decompression;  Surgeon: Eldred Manges, MD;  Location: [facility];  Service:  Orthopedics;  Laterality: N/A;   LUMBAR LAMINECTOMY/DECOMPRESSION MICRODISCECTOMY N/A 02/06/2015   Procedure: I AND D LUMBAR WITH WOUND VAC PLACEMENT;  Surgeon: Eldred Manges, MD;  Location: [facility];  Service: Orthopedics;  Laterality: N/A;   MASTECTOMY Right 02/01/2020   ORIF TOE FRACTURE Right 07/30/2016   Procedure: OPEN REDUCTION INTERNAL FIXATION (ORIF) RIGHT 2ND METATARSAL (TOE) FRACTURE;  Surgeon: Eldred Manges, MD;  Location: [facility];  Service: Orthopedics;  Laterality: Right;   POLYPECTOMY  11/13/2019   Procedure: POLYPECTOMY;  Surgeon: Jeani Hawking, MD;  [facility]: [facility];  Service: Endoscopy;;   REPLACEMENT TOTAL KNEE Right 08/02/2022   TOTAL KNEE ARTHROPLASTY Left 03/25/2014   Procedure: TOTAL KNEE ARTHROPLASTY;  Surgeon: Valeria Batman, MD;  Location: [facility] OR;  Service: Orthopedics;  Laterality: Left;   TUBAL LIGATION     Social History   Social History Narrative   Lives with husband.  2  Sons.  Using walker.    Caffeine  Less then 1 cups daily.   Retired.     Immunization History  Administered Date(s) Administered   H1N1 12/23/2007   Influenza Split 11/03/2014   Influenza Whole 12/19/2005, 11/13/2006   Influenza,inj,Quad PF,6+ Mos 09/16/2017   Influenza-Unspecified 11/03/2014   PFIZER Comirnaty(Gray Top)Covid-19 Tri-Sucrose Vaccine 10/07/2019   PFIZER(Purple Top)SARS-COV-2 Vaccination 01/28/2019, 02/18/2019   Pneumococcal Polysaccharide-23 12/19/2005   Tdap 12/20/2011     Objective: Vital Signs: BP 135/82 (BP Location: Left Arm, Patient Position: Sitting, Cuff Size: Normal)   Pulse 88   Resp 16   Ht 4\' 11"  (1.499 m)   Wt 197 lb (89.4 kg)   BMI 39.79 kg/m    Physical Exam Vitals and nursing note reviewed.  Constitutional:      Appearance: She is well-developed.  HENT:     Head: Normocephalic and atraumatic.  Eyes:     Conjunctiva/sclera: Conjunctivae normal.  Cardiovascular:     Rate and Rhythm: Normal rate and regular rhythm.     Heart sounds: Normal  heart sounds.  Pulmonary:     Effort: Pulmonary effort is normal.     Breath sounds: Normal breath sounds.  Abdominal:     General: Bowel sounds are normal.     Palpations: Abdomen is soft.  Musculoskeletal:     Cervical back: Normal range of motion.  Lymphadenopathy:     Cervical: No cervical adenopathy.  Skin:    General: Skin is warm and dry.     Capillary Refill: Capillary refill takes less than 2 seconds.  Neurological:     Mental Status: She is alert and oriented to person, place, and time.  Psychiatric:        Behavior: Behavior normal.      Musculoskeletal Exam: Patient was examined in seated position.  She is ambulating with the help of her walker.  She had limited lateral rotation of the cervical spine.  Shoulder joint abduction was about 90 degrees bilaterally.  Elbow joints and wrist joints with good range of motion.  She has synovial thickening over MCP joints without any synovitis.  PIP and DIP thickening was noted.  Hip joints were difficult to assess in the seated position.  Knee joints were replaced and were in good range of motion.  She had no discomfort range of motion of her knee joints.  There was no tenderness over ankles but thickening of bilateral ankles and subluxation was noted.  There was no tenderness over MTPs.  CDAI Exam: CDAI Score: -- Patient Global: 0 / 100; Provider Global: 0 / 100 Swollen: --; Tender: -- Joint Exam 09/24/2022   No joint exam has been documented for this visit   There is currently no information documented on the homunculus. Go to the Rheumatology activity and complete the homunculus joint exam.  Investigation: No additional findings.  Imaging: No results found.  Recent Labs: Lab Results  Component Value Date   WBC 5.5 03/23/2022   HGB 11.9 03/23/2022   PLT 267 03/23/2022   NA 142 03/23/2022   K 4.7 03/23/2022   CL 104 03/23/2022   CO2 28 03/23/2022   GLUCOSE 82 03/23/2022   BUN 26 (H) 03/23/2022   CREATININE 1.11  (H) 03/23/2022   BILITOT 0.3 03/23/2022   ALKPHOS 157 (H) 01/10/2022   AST 20 03/23/2022   ALT 12 03/23/2022   PROT 7.3 03/23/2022   ALBUMIN 4.3 01/10/2022   CALCIUM 9.7 03/23/2022   GFRAA 62 11/16/2019  QFTBGOLDPLUS Negative 01/10/2022    Speciality Comments: Narcotic Agreement 10/30/16 Humira stopped 01/18/20 d/t chemo/radiation Orencia started 09/15/20  Procedures:  No procedures performed Allergies: Ramipril   Assessment / Plan:     Visit Diagnoses: Rheumatoid arthritis of multiple sites with negative rheumatoid factor (HCC)-patient denies any joint pain or joint swelling.  She was off Orencia for the right total knee replacement which was performed on August 02, 2022 by Dr. Christell Constant.  She resumed Orencia about 2 weeks ago per patient.  She denies any joint pain or joint swelling.  She states she is recovering well from the right total knee replacement.  High risk medication use - Orencia 125 mg sq injections every 7 days.  Patient initiated Orencia on 09/15/2020.  Previously held Humira prior to starting Chemotherapy and radiation therapy.  March 23, 2022 CBC and CMP were normal except creatinine was elevated at 1.11.  TB Gold was negative on January 10, 2022.  Will check labs today.  Therapeutic drug monitoring - Tramadol 50 mg 1 to 2 tablets daily as needed for pain relief.  UDS: 01/10/2022 and narcotic agreement: Was renewed today.  Will obtain UDS today.  Trigger finger, right middle finger -she complains of intermittent triggering of bilateral middle finger.  It is not bothersome currently.  History of bilateral total knee replacement-right total knee replacement August 02, 2022.  She has been ambulating with the help of rollator walker.  She states she is gradually recovering without any problems.  Left total knee replacement.  March 25, 2014.  Primary osteoarthritis of both feet-she denies any discomfort today.  DDD (degenerative disc disease), lumbar-she continues to have lower back  pain.  She had lumbar laminectomy and decompression microdiscectomy x 2.  She takes muscle relaxers on as needed basis.  She has been on tramadol 1 tablet p.o. twice daily as needed.  Side effects of tramadol use were reviewed.  Patient is unable to taper tramadol at this point.  History of hypertension-blood pressure was 132/82.  Other medical problems are listed as follows:  History of hyperlipidemia  History of TIA (transient ischemic attack)  Invasive ductal carcinoma of right breast (HCC)  History of sleep apnea  Orders: Orders Placed This Encounter  Procedures   DRUG MONITOR, TRAMADOL,QN, URINE   DRUG MONITOR, PANEL 5, W/CONF, URINE   CBC with Differential/Platelet   COMPLETE METABOLIC PANEL WITH GFR   No orders of the defined types were placed in this encounter.    Follow-Up Instructions: Return in about 5 months (around 02/24/2023) for Rheumatoid arthritis, Osteoarthritis.   Pollyann Savoy, MD  Note - This record has been created using Animal nutritionist.  Chart creation errors have been sought, but may not always  have been located. Such creation errors do not reflect on  the standard of medical care.

## 2022-09-25 ENCOUNTER — Ambulatory Visit: Payer: Medicare PPO | Admitting: *Deleted

## 2022-09-25 DIAGNOSIS — R600 Localized edema: Secondary | ICD-10-CM

## 2022-09-25 DIAGNOSIS — M25661 Stiffness of right knee, not elsewhere classified: Secondary | ICD-10-CM

## 2022-09-25 DIAGNOSIS — M25561 Pain in right knee: Secondary | ICD-10-CM

## 2022-09-25 NOTE — Progress Notes (Signed)
Hemoglobin is low.  Please advise patient to take multivitamin with iron.  Creatinine is mildly elevated.  Labs are stable.  Please forward results to her PCP.

## 2022-09-25 NOTE — Therapy (Addendum)
OUTPATIENT PHYSICAL THERAPY LOWER EXTREMITY TREATMENT   Patient Name: [name] MRN: [id number] DOB:February 07, 1945, 77 y.o., female Today's Date: 09/25/2022  END OF SESSION:  PT End of Session - 09/25/22 0852     Visit Number 12    Number of Visits 12    Date for PT Re-Evaluation 11/02/22    PT Start Time 0847    PT Stop Time 0914    PT Time Calculation (min) 27 min            Past Medical History:  Diagnosis Date   Anemia    Arthritis    Rheumatoid, followed by Dr. Corliss Skains, knees & ankles    Cancer (HCC)    CHF (congestive heart failure) (HCC)    pt not aware of this   Constipation    GERD (gastroesophageal reflux disease)    H/O cardiovascular stress test 02/2014   seen by Dr. Jacinto Halim - told that she was cleared for surgery   Heart murmur    states she's never had any problems   Hypertension    Leg cramps    Shortness of breath dyspnea    with exertion   Sleep apnea    CPAP in use- QO night, study done at [facility]- 2007    Stroke [facility]) 03/2015   TIA   Past Surgical History:  Procedure Laterality Date   ABDOMINAL HYSTERECTOMY     CARPAL TUNNEL RELEASE Right    COLONOSCOPY     COLONOSCOPY WITH PROPOFOL N/A 11/13/2019   Procedure: COLONOSCOPY WITH PROPOFOL;  Surgeon: Jeani Hawking, MD;  [facility];  Service: Endoscopy;  Laterality: N/A;   ESOPHAGOGASTRODUODENOSCOPY (EGD) WITH PROPOFOL N/A 11/13/2019   Procedure: ESOPHAGOGASTRODUODENOSCOPY (EGD) WITH PROPOFOL;  Surgeon: Jeani Hawking, MD;  [facility]: [facility];  Service: Endoscopy;  Laterality: N/A;   FOOT SURGERY Bilateral    bunionectomy, calluses , also has several pins & screws   HAND SURGERY     JOINT REPLACEMENT     planned for 03/25/2014   KNEE ARTHROPLASTY     KNEE SURGERY  1990's    LUMBAR LAMINECTOMY/DECOMPRESSION MICRODISCECTOMY N/A 01/17/2015   Procedure: L2-3, L3-4 Decompression;  Surgeon: Eldred Manges, MD;  Location[facility];  Service: Orthopedics;  Laterality: N/A;   LUMBAR  LAMINECTOMY/DECOMPRESSION MICRODISCECTOMY N/A 02/06/2015   Procedure: I AND D LUMBAR WITH WOUND VAC PLACEMENT;  Surgeon: Eldred Manges, MD;  Location: [facility];  Service: Orthopedics;  Laterality: N/A;   MASTECTOMY Right 02/01/2020   ORIF TOE FRACTURE Right 07/30/2016   Procedure: OPEN REDUCTION INTERNAL FIXATION (ORIF) RIGHT 2ND METATARSAL (TOE) FRACTURE;  Surgeon: Eldred Manges, MD;  Location: [facility];  Service: Orthopedics;  Laterality: Right;   POLYPECTOMY  11/13/2019   Procedure: POLYPECTOMY;  Surgeon: Jeani Hawking, MD;  [facility];  Service: Endoscopy;;   REPLACEMENT TOTAL KNEE Right 08/02/2022   TOTAL KNEE ARTHROPLASTY Left 03/25/2014   Procedure: TOTAL KNEE ARTHROPLASTY;  Surgeon: Valeria Batman, MD;  Location: [facility] OR;  Service: Orthopedics;  Laterality: Left;   TUBAL LIGATION     Patient Active Problem List   Diagnosis Date Noted   Unilateral primary osteoarthritis, right knee 11/18/2018   Closed fracture of shaft of metatarsal bone 07/30/2016   Metatarsal fracture 07/30/2016   Displaced fracture of second metatarsal bone, right foot, initial encounter for closed fracture 07/26/2016   Arthritis of shoulder 04/10/2016   High risk medication use 03/16/2016   Primary osteoarthritis of both feet 03/16/2016   Primary  osteoarthritis of both knees 03/16/2016   DDD (degenerative disc disease), lumbar 03/16/2016   OSA on CPAP 07/27/2015   Lip numbness 03/19/2015   TIA (transient ischemic attack) 03/19/2015   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HCC) 02/06/2015   Post op infection 02/05/2015   History of lumbar laminectomy for spinal cord decompression 01/17/2015   Lumbar spinal stenosis 01/17/2015   Primary osteoarthritis of left knee 03/25/2014   Rheumatoid arthritis of multiple sites with negative rheumatoid factor (HCC) 03/25/2014   History of total knee replacement, left 03/25/2014   DERMATITIS 09/28/2008   CONSTIPATION 05/25/2008   DYSPNEA ON EXERTION 05/25/2008   ANEMIA,  NORMOCYTIC 04/13/2008   ANKLE PAIN, BILATERAL 11/25/2007   HLD (hyperlipidemia) 08/14/2006   Morbid (severe) obesity due to excess calories (HCC) 12/13/2005   CARPAL TUNNEL SYNDROME 12/13/2005   PERIPHERAL NEUROPATHY 12/13/2005   Essential hypertension 12/13/2005   DYSRHYTHMIA, CARDIAC NOS 12/13/2005   PCP: Amil Amen, MD  REFERRING PROVIDER: Griffin Dakin, MD   REFERRING DIAG: Unilateral primary osteoarthritis, right knee; Aftercare following joint replacement surgery; Presence of right artificial knee joint   THERAPY DIAG:  Stiffness of right knee, not elsewhere classified  Localized edema  Acute pain of right knee  Rationale for Evaluation and Treatment: Rehabilitation  ONSET DATE: 08/02/22  SUBJECTIVE:   SUBJECTIVE STATEMENT: No new complaints. RT knee 2/10. Ready to DC. Need to leave early  PERTINENT HISTORY: Hypertension, history of a TIA, peripheral neuropathy, osteoarthritis, rheumatoid arthritis, chronic low back pain, and history of cancer  PAIN:  Are you having pain? Yes: NPRS scale: 1-2/10 Pain location: R knee   Pain description: Sore Aggravating factors:   Relieving factors:      PRECAUTIONS: Fall  WEIGHT BEARING RESTRICTIONS: No  FALLS:  Has patient fallen in last 6 months? Yes. Number of falls 1; she was bending over to pick up something off her bathroom floor when she slipped and fell ([facility] Day 2024)  PATIENT GOALS: be able to walk with her cane, and improved knee mobility  NEXT MD VISIT: 09/18/22  OBJECTIVE:   PATIENT SURVEYS:  FOTO 45.26  COGNITION: Overall cognitive status: Within functional limits for tasks assessed     SENSATION: Patient reports no numbness or tingling  EDEMA:  Circumferential: Right tibiofemoral joint line: 54 cm Left tibiofemoral joint line: 45 cm  PALPATION: TTP: right quadriceps, hip adductors, and gastrocnemius/soleus  LOWER EXTREMITY ROM:  Active ROM Right eval Left eval Right  AROM 08/22/22 Right AROM 09/04/2022 Right AROM 09/11/22  Hip flexion       Hip extension       Hip abduction       Hip adduction       Hip internal rotation       Hip external rotation       Knee flexion 81/ 84 (PROM) 118 90 108 107  Knee extension 1 0 0 0   Ankle dorsiflexion       Ankle plantarflexion       Ankle inversion       Ankle eversion        (Blank rows = not tested)  LOWER EXTREMITY MMT: not tested due to surgical condition   GAIT: Assistive device utilized: Single point cane Level of assistance: Modified independence Comments: decreased gait speed, decreased knee flexion   TODAY'S TREATMENT:  DATE:  09/25/22:                                     EXERCISE LOG   RT knee  Exercise Repetitions and Resistance Comments  Nustep  Level 3 x 16 minutes.   Knee ext machine    Ham curl machine    Rocker board    Step up 6 in    LAQ 5#          3x10 pause at top   HS curl    Vasopneumatic  Manual: PROM flexion to 108 degrees Discussed and reviewed  HEP   09/13/22  EXERCISE LOG  Exercise Repetitions and Resistance Comments  Nustep L3 x 16 mins seat 7   Forward step up 6" step x20 reps   LAQ 4# x30 reps 5 sec holds   HS curl Seated x20 reps each; red theraband   NBOS on airex X2 min   Sidestepping  // bars x5 reps    PATIENT EDUCATION:  Education details: reviewed HH HEP, POC, prognosis, healing, edema, objective findings, and goals for therapy Person educated: Patient Education method: Explanation Education comprehension: verbalized understanding  HOME EXERCISE PROGRAM:  Lunges to stairs that she has access to. Was practiced in clinic on 09/04/22  ASSESSMENT:  CLINICAL IMPRESSION:FOTO discharged Patient  arrived today in good spirits and was able to continue with therex for strengthening as well as HEP. She did great again today and ROM   was 108 degrees. She was unable to meet LTG for ROM and stairs today.    PHYSICAL THERAPY DISCHARGE SUMMARY  Visits from Start of Care: 12  Current functional level related to goals / functional outcomes: Patient was able to meet most of her goals for skilled physical therapy.    Remaining deficits: Right knee flexion and eccentric quadriceps control    Education / Equipment: HEP    Patient agrees to discharge. Patient goals were partially met. Patient is being discharged due to being pleased with the current functional level.  Candi Leash, PT, DPT    OBJECTIVE IMPAIRMENTS: Abnormal gait, decreased activity tolerance, decreased mobility, difficulty walking, decreased ROM, decreased strength, increased edema, impaired tone, and pain.   ACTIVITY LIMITATIONS: carrying, lifting, standing, squatting, stairs, and locomotion level  PARTICIPATION LIMITATIONS: meal prep, cleaning, laundry, shopping, and community activity  PERSONAL FACTORS: 3+ comorbidities: Hypertension, history of a TIA, peripheral neuropathy, osteoarthritis, rheumatoid arthritis, chronic low back pain, and history of cancer  are also affecting patient's functional outcome.   REHAB POTENTIAL: Good  CLINICAL DECISION MAKING: Stable/uncomplicated  EVALUATION COMPLEXITY: Low  GOALS: Goals reviewed with patient? Yes  SHORT TERM GOALS: Target date: 09/10/22 Patient will be independent with her initial HEP.  Baseline: Goal status: MET  2.  Patient will be able to demonstrate at least 95 degrees of active right knee flexion for improve knee mobility.  Baseline: 8/26: 95 degrees Goal status: MET  LONG TERM GOALS: Target date: 10/01/22  Patient will be independent with her advanced HEP.  Baseline:  Goal status: MET  2.  Patient will be able to demonstrate at least 115 degrees of right knee flexion for improved function navigating stairs.  Baseline:  Goal status: NM    108 degrees  3.  Patient will be able to  navigate at least 80 feet with a single point cane for improved household mobility.  Baseline:  Goal status: MET  4.  Patient will be able to navigate at least 2 steps with a reciprocal pattern for improved household mobility.  Baseline:  Goal status: NM Not due to RT knee  PLAN:  PT FREQUENCY: 2x/week  PT DURATION: 6 weeks  PLANNED INTERVENTIONS: Therapeutic exercises, Therapeutic activity, Neuromuscular re-education, Balance training, Gait training, Patient/Family education, Self Care, Joint mobilization, Stair training, Electrical stimulation, Cryotherapy, Moist heat, Vasopneumatic device, Manual therapy, and Re-evaluation  PLAN FOR NEXT SESSION: DC to HEP  Infant Doane,CHRIS, PTA 09/25/2022, 10:13 AM

## 2022-09-28 LAB — COMPLETE METABOLIC PANEL WITH GFR
AG Ratio: 1.3 (calc) (ref 1.0–2.5)
ALT: 10 U/L (ref 6–29)
AST: 15 U/L (ref 10–35)
Albumin: 3.9 g/dL (ref 3.6–5.1)
Alkaline phosphatase (APISO): 125 U/L (ref 37–153)
BUN/Creatinine Ratio: 18 (calc) (ref 6–22)
BUN: 19 mg/dL (ref 7–25)
CO2: 25 mmol/L (ref 20–32)
Calcium: 9.5 mg/dL (ref 8.6–10.4)
Chloride: 106 mmol/L (ref 98–110)
Creat: 1.06 mg/dL — ABNORMAL HIGH (ref 0.60–1.00)
Globulin: 3 g/dL (calc) (ref 1.9–3.7)
Glucose, Bld: 72 mg/dL (ref 65–139)
Potassium: 4.2 mmol/L (ref 3.5–5.3)
Sodium: 141 mmol/L (ref 135–146)
Total Bilirubin: 0.3 mg/dL (ref 0.2–1.2)
Total Protein: 6.9 g/dL (ref 6.1–8.1)
eGFR: 54 mL/min/{1.73_m2} — ABNORMAL LOW (ref >=60)

## 2022-09-28 LAB — CBC WITH DIFFERENTIAL/PLATELET
Absolute Monocytes: 409 cells/uL (ref 200–950)
Basophils Absolute: 43 cells/uL (ref 0–200)
Basophils Relative: 0.7 %
Eosinophils Absolute: 110 cells/uL (ref 15–500)
Eosinophils Relative: 1.8 %
HCT: 34 % — ABNORMAL LOW (ref 35.0–45.0)
Hemoglobin: 10.9 g/dL — ABNORMAL LOW (ref 11.7–15.5)
Lymphs Abs: 1696 cells/uL (ref 850–3900)
MCH: 28.5 pg (ref 27.0–33.0)
MCHC: 32.1 g/dL (ref 32.0–36.0)
MCV: 89 fL (ref 80.0–100.0)
MPV: 10.6 fL (ref 7.5–12.5)
Monocytes Relative: 6.7 %
Neutro Abs: 3843 cells/uL (ref 1500–7800)
Neutrophils Relative %: 63 %
Platelets: 280 10*3/uL (ref 140–400)
RBC: 3.82 10*6/uL (ref 3.80–5.10)
RDW: 13 % (ref 11.0–15.0)
Total Lymphocyte: 27.8 %
WBC: 6.1 10*3/uL (ref 3.8–10.8)

## 2022-09-28 LAB — DRUG MONITOR, TRAMADOL,QN, URINE
Desmethyltramadol: >10000 ng/mL — ABNORMAL HIGH (ref <100)
Tramadol: >10000 ng/mL — ABNORMAL HIGH (ref <100)

## 2022-09-28 LAB — DRUG MONITOR, PANEL 5, W/CONF, URINE
Amphetamines: NEGATIVE ng/mL (ref <500)
Barbiturates: NEGATIVE ng/mL (ref <300)
Benzodiazepines: NEGATIVE ng/mL (ref <100)
Cocaine Metabolite: NEGATIVE ng/mL (ref <150)
Creatinine: 136.3 mg/dL (ref >=20.0)
Marijuana Metabolite: NEGATIVE ng/mL (ref <20)
Methadone Metabolite: NEGATIVE ng/mL (ref <100)
Opiates: NEGATIVE ng/mL (ref <100)
Oxidant: NEGATIVE ug/mL (ref <200)
Oxycodone: NEGATIVE ng/mL (ref <100)
pH: 4.9 (ref 4.5–9.0)

## 2022-09-28 LAB — DM TEMPLATE

## 2022-09-28 NOTE — Progress Notes (Signed)
UDS is consistent with tramadol use.

## 2022-10-03 ENCOUNTER — Other Ambulatory Visit: Payer: Self-pay | Admitting: Rheumatology

## 2022-10-03 DIAGNOSIS — Z5181 Encounter for therapeutic drug level monitoring: Secondary | ICD-10-CM

## 2022-10-03 DIAGNOSIS — Z79899 Other long term (current) drug therapy: Secondary | ICD-10-CM

## 2022-10-03 NOTE — Telephone Encounter (Signed)
Last Fill: 09/05/2022  UDS:09/24/2022 UDS is consistent with tramadol use.   Narc Agreement: 09/24/2022  Next Visit: 02/25/2023  Last Visit: 09/24/2022  Dx: Rheumatoid arthritis of multiple sites with negative rheumatoid factor   Current Dose per office note on 09/24/2022:Tramadol 50 mg 1 to 2 tablets daily as needed for pain relief.    Okay to refill Tramadol?

## 2022-10-23 ENCOUNTER — Other Ambulatory Visit: Payer: Self-pay

## 2022-10-23 NOTE — Progress Notes (Signed)
Specialty Pharmacy Refill Coordination Note  [name] is a 77 y.o. female contacted today regarding refills of specialty medication(s) Abatacept   Patient requested Delivery   Delivery date: 10/25/22   Verified address: [address]   [place] [id number]   Medication will be filled on 10/24/22.

## 2022-10-24 ENCOUNTER — Other Ambulatory Visit: Payer: Self-pay

## 2022-10-25 ENCOUNTER — Encounter: Payer: Self-pay | Admitting: Urology

## 2022-10-25 ENCOUNTER — Other Ambulatory Visit: Payer: Self-pay | Admitting: Urology

## 2022-10-25 DIAGNOSIS — R3129 Other microscopic hematuria: Secondary | ICD-10-CM

## 2022-11-08 ENCOUNTER — Other Ambulatory Visit: Payer: Self-pay | Admitting: Rheumatology

## 2022-11-08 DIAGNOSIS — Z79899 Other long term (current) drug therapy: Secondary | ICD-10-CM

## 2022-11-08 DIAGNOSIS — Z5181 Encounter for therapeutic drug level monitoring: Secondary | ICD-10-CM

## 2022-11-08 NOTE — Telephone Encounter (Signed)
Last Fill: 10/03/2022   UDS:09/24/2022 UDS is consistent with tramadol use.    Narc Agreement: 09/24/2022   Next Visit: 02/25/2023   Last Visit: 09/24/2022   Dx: Rheumatoid arthritis of multiple sites with negative rheumatoid factor    Current Dose per office note on 09/24/2022:Tramadol 50 mg 1 to 2 tablets daily as needed for pain relief.      Okay to refill Tramadol?

## 2022-11-12 ENCOUNTER — Inpatient Hospital Stay
Admission: RE | Admit: 2022-11-12 | Discharge: 2022-11-12 | Disposition: A | Discharge status: Home / Self Care | Payer: Medicare PPO | Source: Ambulatory Visit | Attending: Urology | Admitting: Urology

## 2022-11-12 DIAGNOSIS — R3129 Other microscopic hematuria: Secondary | ICD-10-CM

## 2022-11-13 ENCOUNTER — Other Ambulatory Visit: Payer: Self-pay

## 2022-11-13 NOTE — Progress Notes (Signed)
Specialty Pharmacy Ongoing Clinical Assessment Note  [name] is a 77 y.o. female who is being followed by the specialty pharmacy service for RxSp Rheumatoid Arthritis   Patient's specialty medication(s) reviewed today: Abatacept   Missed doses in the last 4 weeks: 0   Patient/Caregiver did not have any additional questions or concerns.   Therapeutic benefit summary: Patient is achieving benefit   Adverse events/side effects summary: No adverse events/side effects   Patient's therapy is appropriate to: Continue    Goals Addressed             This Visit's Progress    Reduce signs and symptoms       Patient is on track. Patient will maintain adherence         Follow up:  6 months  Otto Herb Specialty Pharmacist

## 2022-11-13 NOTE — Progress Notes (Signed)
Specialty Pharmacy Refill Coordination Note  [name] is a 77 y.o. female contacted today regarding refills of specialty medication(s) Abatacept   Patient requested Delivery   Delivery date: 11/23/22   Verified address: [address]   [place] [id number]   Medication will be filled on 11/22/22.

## 2022-12-08 ENCOUNTER — Other Ambulatory Visit: Payer: Self-pay | Admitting: Rheumatology

## 2022-12-08 DIAGNOSIS — Z5181 Encounter for therapeutic drug level monitoring: Secondary | ICD-10-CM

## 2022-12-08 DIAGNOSIS — Z79899 Other long term (current) drug therapy: Secondary | ICD-10-CM

## 2022-12-10 NOTE — Telephone Encounter (Signed)
Last Fill: 11/08/2022   UDS:09/24/2022 UDS is consistent with tramadol use.    Narc Agreement: 09/24/2022   Next Visit: 02/25/2023   Last Visit: 09/24/2022   Dx: Rheumatoid arthritis of multiple sites with negative rheumatoid factor    Current Dose per office note on 09/24/2022:Tramadol 50 mg 1 to 2 tablets daily as needed for pain relief.      Okay to refill Tramadol?

## 2022-12-11 ENCOUNTER — Other Ambulatory Visit: Payer: Self-pay

## 2022-12-11 ENCOUNTER — Other Ambulatory Visit: Payer: Self-pay | Admitting: Physician Assistant

## 2022-12-11 DIAGNOSIS — M0609 Rheumatoid arthritis without rheumatoid factor, multiple sites: Secondary | ICD-10-CM

## 2022-12-11 MED ORDER — ORENCIA CLICKJECT 125 MG/ML ~~LOC~~ SOAJ
125.0000 mg | SUBCUTANEOUS | 0 refills | Status: DC
  Filled 2022-12-12: qty 4, 28d supply, fill #0
  Filled 2023-01-08 – 2023-01-29 (×2): qty 4, 28d supply, fill #1
  Filled 2023-02-26: qty 4, 28d supply, fill #2

## 2022-12-11 NOTE — Progress Notes (Signed)
Specialty Pharmacy Refill Coordination Note  [name] is a 77 y.o. female contacted today regarding refills of specialty medication(s) Abatacept   Patient requested Delivery   Delivery date: 12/20/22   Verified address: [address] [facility] [place] [address]   Medication will be filled on 12/19/22.  *pending refill request-- call if any delays*

## 2022-12-11 NOTE — Telephone Encounter (Signed)
Last Fill: 09/24/2022  Labs: 09/24/2022 Hemoglobin is low Creatinine is mildly elevated.  Labs are stable.     TB Gold: 01/10/2022   Next Visit: 02/25/2023  Last Visit: 09/24/2022  HQ:[id number] arthritis of multiple sites with negative rheumatoid factor   Current Dose per office note 09/24/2022: Orencia 125 mg sq injections every 7 days.   Patient advised her labs are due this month. Patient expressed understanding and will update.   Okay to refill Orencia?

## 2022-12-12 ENCOUNTER — Other Ambulatory Visit: Payer: Self-pay | Admitting: *Deleted

## 2022-12-12 ENCOUNTER — Other Ambulatory Visit: Payer: Self-pay

## 2022-12-12 DIAGNOSIS — Z79899 Other long term (current) drug therapy: Secondary | ICD-10-CM

## 2022-12-14 LAB — CBC WITH DIFFERENTIAL/PLATELET
Basophils Absolute: 0.1 10*3/uL (ref 0.0–0.2)
Basos: 1 %
EOS (ABSOLUTE): 0.1 10*3/uL (ref 0.0–0.4)
Eos: 1 %
Hematocrit: 37.4 % (ref 34.0–46.6)
Hemoglobin: 11.8 g/dL (ref 11.1–15.9)
Immature Grans (Abs): 0 10*3/uL (ref 0.0–0.1)
Immature Granulocytes: 0 %
Lymphocytes Absolute: 1.6 10*3/uL (ref 0.7–3.1)
Lymphs: 28 %
MCH: 28.1 pg (ref 26.6–33.0)
MCHC: 31.6 g/dL (ref 31.5–35.7)
MCV: 89 fL (ref 79–97)
Monocytes Absolute: 0.3 10*3/uL (ref 0.1–0.9)
Monocytes: 6 %
Neutrophils Absolute: 3.7 10*3/uL (ref 1.4–7.0)
Neutrophils: 64 %
Platelets: 299 10*3/uL (ref 150–450)
RBC: 4.2 x10E6/uL (ref 3.77–5.28)
RDW: 14 % (ref 11.7–15.4)
WBC: 5.8 10*3/uL (ref 3.4–10.8)

## 2022-12-14 LAB — CMP14+EGFR
ALT: 9 [IU]/L (ref 0–32)
AST: 19 [IU]/L (ref 0–40)
Albumin: 4.2 g/dL (ref 3.8–4.8)
Alkaline Phosphatase: 138 [IU]/L — ABNORMAL HIGH (ref 44–121)
BUN/Creatinine Ratio: 13 (ref 12–28)
BUN: 12 mg/dL (ref 8–27)
Bilirubin Total: 0.3 mg/dL (ref 0.0–1.2)
CO2: 24 mmol/L (ref 20–29)
Calcium: 9.5 mg/dL (ref 8.7–10.3)
Chloride: 105 mmol/L (ref 96–106)
Creatinine, Ser: 0.92 mg/dL (ref 0.57–1.00)
Globulin, Total: 3 g/dL (ref 1.5–4.5)
Glucose: 74 mg/dL (ref 70–99)
Potassium: 4.2 mmol/L (ref 3.5–5.2)
Sodium: 144 mmol/L (ref 134–144)
Total Protein: 7.2 g/dL (ref 6.0–8.5)
eGFR: 65 mL/min/{1.73_m2} (ref >59)

## 2022-12-14 NOTE — Progress Notes (Signed)
CBC is normal.  CMP shows mildly elevated alkaline phosphatase.  Will continue to monitor labs.

## 2022-12-19 ENCOUNTER — Other Ambulatory Visit: Payer: Self-pay

## 2023-01-08 ENCOUNTER — Other Ambulatory Visit: Payer: Self-pay | Admitting: Physician Assistant

## 2023-01-08 ENCOUNTER — Other Ambulatory Visit: Payer: Self-pay

## 2023-01-08 DIAGNOSIS — Z79899 Other long term (current) drug therapy: Secondary | ICD-10-CM

## 2023-01-08 DIAGNOSIS — Z5181 Encounter for therapeutic drug level monitoring: Secondary | ICD-10-CM

## 2023-01-10 NOTE — Telephone Encounter (Signed)
 Last Fill: 12/10/2022  UDS: 09/24/2022 UDS is consistent with tramadol  use.   Narc Agreement: 09/24/2022  Next Visit: 02/25/2023  Last Visit: 09/24/2022  Dx: Rheumatoid arthritis of multiple sites with negative rheumatoid factor   Current Dose per office note on 09/24/2022:Tramadol  50 mg 1 to 2 tablets daily as needed for pain relief.    Okay to refill Tramadol ?

## 2023-01-29 ENCOUNTER — Other Ambulatory Visit: Payer: Self-pay

## 2023-01-29 ENCOUNTER — Other Ambulatory Visit (HOSPITAL_COMMUNITY): Payer: Self-pay

## 2023-01-29 NOTE — Progress Notes (Signed)
Specialty Pharmacy Refill Coordination Note  [name] is a 78 y.o. female contacted today regarding refills of specialty medication(s) Abatacept (Orencia ClickJect)   Patient requested Delivery   Delivery date: 02/06/23   Verified address: [address] [place] [address]   Medication will be filled on 02/05/23.

## 2023-02-05 ENCOUNTER — Other Ambulatory Visit: Payer: Self-pay

## 2023-02-07 ENCOUNTER — Other Ambulatory Visit (HOSPITAL_COMMUNITY): Payer: Self-pay

## 2023-02-08 ENCOUNTER — Other Ambulatory Visit: Payer: Self-pay | Admitting: Rheumatology

## 2023-02-08 DIAGNOSIS — Z5181 Encounter for therapeutic drug level monitoring: Secondary | ICD-10-CM

## 2023-02-08 DIAGNOSIS — Z79899 Other long term (current) drug therapy: Secondary | ICD-10-CM

## 2023-02-08 NOTE — Telephone Encounter (Signed)
Last Fill: 01/10/2023  UDS:09/24/2022 UDS is consistent with tramadol use.   Narc Agreement: 09/24/2022  Next Visit: 02/25/2023  Last Visit: 09/24/2022  Dx: Rheumatoid arthritis of multiple sites with negative rheumatoid factor   Current Dose per office note on on 09/24/2022:Tramadol 50 mg 1 to 2 tablets daily as needed for pain relief.    Okay to refill tramadol?

## 2023-02-11 NOTE — Progress Notes (Deleted)
 Office Visit Note  Patient: [name]             Date of Birth: 07-15-1945           MRN: [id number]             PCP: Amil Amen, MD Referring: Amil Amen, MD Visit Date: 02/25/2023 Occupation: @GUAROCC @  Subjective:  No chief complaint on file.   History of Present Illness: [name] is a 78 y.o. female ***     Activities of Daily Living:  Patient reports morning stiffness for *** {minute/hour:19697}.   Patient {ACTIONS;DENIES/REPORTS:21021675::"Denies"} nocturnal pain.  Difficulty dressing/grooming: {ACTIONS;DENIES/REPORTS:21021675::"Denies"} Difficulty climbing stairs: {ACTIONS;DENIES/REPORTS:21021675::"Denies"} Difficulty getting out of chair: {ACTIONS;DENIES/REPORTS:21021675::"Denies"} Difficulty using hands for taps, buttons, cutlery, and/or writing: {ACTIONS;DENIES/REPORTS:21021675::"Denies"}  No Rheumatology ROS completed.   PMFS History:  Patient Active Problem List   Diagnosis Date Noted   Unilateral primary osteoarthritis, right knee 11/18/2018   Closed fracture of shaft of metatarsal bone 07/30/2016   Metatarsal fracture 07/30/2016   Displaced fracture of second metatarsal bone, right foot, initial encounter for closed fracture 07/26/2016   Arthritis of shoulder 04/10/2016   High risk medication use 03/16/2016   Primary osteoarthritis of both feet 03/16/2016   Primary osteoarthritis of both knees 03/16/2016   DDD (degenerative disc disease), lumbar 03/16/2016   OSA on CPAP 07/27/2015   Lip numbness 03/19/2015   TIA (transient ischemic attack) 03/19/2015   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HCC) 02/06/2015   Post op infection 02/05/2015   History of lumbar laminectomy for spinal cord decompression 01/17/2015   Lumbar spinal stenosis 01/17/2015   Primary osteoarthritis of left knee 03/25/2014   Rheumatoid arthritis of multiple sites with negative rheumatoid factor (HCC) 03/25/2014   History of total knee replacement, left  03/25/2014   DERMATITIS 09/28/2008   Constipation 05/25/2008   DYSPNEA ON EXERTION 05/25/2008   ANEMIA, NORMOCYTIC 04/13/2008   ANKLE PAIN, BILATERAL 11/25/2007   HLD (hyperlipidemia) 08/14/2006   Morbid (severe) obesity due to excess calories (HCC) 12/13/2005   CARPAL TUNNEL SYNDROME 12/13/2005   Hereditary and idiopathic peripheral neuropathy 12/13/2005   Essential hypertension 12/13/2005   Cardiac dysrhythmia 12/13/2005    Past Medical History:  Diagnosis Date   Anemia    Arthritis    Rheumatoid, followed by Dr. Corliss Skains, knees & ankles    Cancer (HCC)    CHF (congestive heart failure) (HCC)    pt not aware of this   Constipation    GERD (gastroesophageal reflux disease)    H/O cardiovascular stress test 02/2014   seen by Dr. Jacinto Halim - told that she was cleared for surgery   Heart murmur    states she's never had any problems   Hypertension    Leg cramps    Shortness of breath dyspnea    with exertion   Sleep apnea    CPAP in use- QO night, study done at [facility]- 2007    Stroke [facility]) 03/2015   TIA    Family History  Problem Relation Age of Onset   Seizures Mother    Transient ischemic attack Mother    Hypertension Mother    Diabetes Mother    Congestive Heart Failure Mother    Arthritis/Rheumatoid Mother    Parkinson's disease Mother    Dementia Mother    Heart disease Father        CHF   Stroke Sister    Depression Sister    Arthritis Brother    Arthritis Brother  Arthritis Son    Hypertension Son    Past Surgical History:  Procedure Laterality Date   ABDOMINAL HYSTERECTOMY     CARPAL TUNNEL RELEASE Right    COLONOSCOPY     COLONOSCOPY WITH PROPOFOL N/A 11/13/2019   Procedure: COLONOSCOPY WITH PROPOFOL;  Surgeon: Jeani Hawking, MD;  Location: [facility];  Service: Endoscopy;  Laterality: N/A;   ESOPHAGOGASTRODUODENOSCOPY (EGD) WITH PROPOFOL N/A 11/13/2019   Procedure: ESOPHAGOGASTRODUODENOSCOPY (EGD) WITH PROPOFOL;  Surgeon: Jeani Hawking, MD;   Location: [facility];  Service: Endoscopy;  Laterality: N/A;   FOOT SURGERY Bilateral    bunionectomy, calluses , also has several pins & screws   HAND SURGERY     JOINT REPLACEMENT     planned for 03/25/2014   KNEE ARTHROPLASTY     KNEE SURGERY  1990's    LUMBAR LAMINECTOMY/DECOMPRESSION MICRODISCECTOMY N/A 01/17/2015   Procedure: L2-3, L3-4 Decompression;  Surgeon: Eldred Manges, MD;  Location: [facility] OR;  Service: Orthopedics;  Laterality: N/A;   LUMBAR LAMINECTOMY/DECOMPRESSION MICRODISCECTOMY N/A 02/06/2015   Procedure: I AND D LUMBAR WITH WOUND VAC PLACEMENT;  Surgeon: Eldred Manges, MD;  Location: [facility] OR;  Service: Orthopedics;  Laterality: N/A;   MASTECTOMY Right 02/01/2020   ORIF TOE FRACTURE Right 07/30/2016   Procedure: OPEN REDUCTION INTERNAL FIXATION (ORIF) RIGHT 2ND METATARSAL (TOE) FRACTURE;  Surgeon: Eldred Manges, MD;  Location: [facility] OR;  Service: Orthopedics;  Laterality: Right;   POLYPECTOMY  11/13/2019   Procedure: POLYPECTOMY;  Surgeon: Jeani Hawking, MD;  Location: [facility];  Service: Endoscopy;;   REPLACEMENT TOTAL KNEE Right 08/02/2022   TOTAL KNEE ARTHROPLASTY Left 03/25/2014   Procedure: TOTAL KNEE ARTHROPLASTY;  Surgeon: Valeria Batman, MD;  [facility] OR;  Service: Orthopedics;  Laterality: Left;   TUBAL LIGATION     Social History   Social History Narrative   Lives with husband.  2 Sons.  Using walker.    Caffeine  Less then 1 cups daily.   Retired.     Immunization History  Administered Date(s) Administered   H1N1 12/23/2007   Influenza Split 11/03/2014   Influenza Whole 12/19/2005, 11/13/2006   Influenza,inj,Quad PF,6+ Mos 09/16/2017   Influenza-Unspecified 11/03/2014   PFIZER Comirnaty(Gray Top)Covid-19 Tri-Sucrose Vaccine 10/07/2019   PFIZER(Purple Top)SARS-COV-2 Vaccination 01/28/2019, 02/18/2019   Pneumococcal Polysaccharide-23 12/19/2005   Tdap 12/20/2011     Objective: Vital Signs: There were no vitals taken for this visit.   Physical  Exam   Musculoskeletal Exam: ***  CDAI Exam: CDAI Score: -- Patient Global: --; Provider Global: -- Swollen: --; Tender: -- Joint Exam 02/25/2023   No joint exam has been documented for this visit   There is currently no information documented on the homunculus. Go to the Rheumatology activity and complete the homunculus joint exam.  Investigation: No additional findings.  Imaging: No results found.  Recent Labs: Lab Results  Component Value Date   WBC 5.8 12/13/2022   HGB 11.8 12/13/2022   PLT 299 12/13/2022   NA 144 12/13/2022   K 4.2 12/13/2022   CL 105 12/13/2022   CO2 24 12/13/2022   GLUCOSE 74 12/13/2022   BUN 12 12/13/2022   CREATININE 0.92 12/13/2022   BILITOT 0.3 12/13/2022   ALKPHOS 138 (H) 12/13/2022   AST 19 12/13/2022   ALT 9 12/13/2022   PROT 7.2 12/13/2022   ALBUMIN 4.2 12/13/2022   CALCIUM 9.5 12/13/2022   GFRAA 62 11/16/2019   QFTBGOLDPLUS Negative 01/10/2022    Speciality Comments: Narcotic Agreement 10/30/16  Humira stopped 01/18/20 d/t chemo/radiation Orencia started 09/15/20  Procedures:  No procedures performed Allergies: Ramipril   Assessment / Plan:     Visit Diagnoses: Rheumatoid arthritis of multiple sites with negative rheumatoid factor (HCC)  High risk medication use  Trigger finger, right middle finger  History of total knee replacement, bilateral  Primary osteoarthritis of both feet  Degeneration of intervertebral disc of lumbar region without discogenic back pain or lower extremity pain  History of hypertension  History of hyperlipidemia  History of TIA (transient ischemic attack)  Invasive ductal carcinoma of right breast (HCC)  History of sleep apnea  Orders: No orders of the defined types were placed in this encounter.  No orders of the defined types were placed in this encounter.   Face-to-face time spent with patient was *** minutes. Greater than 50% of time was spent in counseling and coordination of  care.  Follow-Up Instructions: No follow-ups on file.   Gearldine Bienenstock, PA-C  Note - This record has been created using Dragon software.  Chart creation errors have been sought, but may not always  have been located. Such creation errors do not reflect on  the standard of medical care.

## 2023-02-18 ENCOUNTER — Telehealth: Payer: Self-pay | Admitting: *Deleted

## 2023-02-18 NOTE — Telephone Encounter (Signed)
 Patient contacted the office and states she received a letter from [facility]. Patient states the letter advises that her Orencia  is ont on their approval list. Patient is requesting a call back.

## 2023-02-19 NOTE — Telephone Encounter (Signed)
Submitted a Prior Authorization RENEWAL request to [facility] for ORENCIA SQ via CoverMyMeds. Will update once we receive a response.  Key: [id number]   Chesley Mires, PharmD, MPH, BCPS, CPP Clinical Pharmacist (Rheumatology and Pulmonology)

## 2023-02-22 NOTE — Telephone Encounter (Signed)
Received notification from [facility] regarding a prior authorization for ORENCIA SQ. Authorization has been APPROVED from 02/19/2023 to 01/08/2024. Approval letter sent to scan center.  Chesley Mires, PharmD, MPH, BCPS, CPP Clinical Pharmacist (Rheumatology and Pulmonology)

## 2023-02-25 ENCOUNTER — Ambulatory Visit: Payer: Medicare PPO | Admitting: Physician Assistant

## 2023-02-25 DIAGNOSIS — Z96653 Presence of artificial knee joint, bilateral: Secondary | ICD-10-CM

## 2023-02-25 DIAGNOSIS — M51369 Other intervertebral disc degeneration, lumbar region without mention of lumbar back pain or lower extremity pain: Secondary | ICD-10-CM

## 2023-02-25 DIAGNOSIS — M19071 Primary osteoarthritis, right ankle and foot: Secondary | ICD-10-CM

## 2023-02-25 DIAGNOSIS — Z8669 Personal history of other diseases of the nervous system and sense organs: Secondary | ICD-10-CM

## 2023-02-25 DIAGNOSIS — M65331 Trigger finger, right middle finger: Secondary | ICD-10-CM

## 2023-02-25 DIAGNOSIS — Z8639 Personal history of other endocrine, nutritional and metabolic disease: Secondary | ICD-10-CM

## 2023-02-25 DIAGNOSIS — Z79899 Other long term (current) drug therapy: Secondary | ICD-10-CM

## 2023-02-25 DIAGNOSIS — C50911 Malignant neoplasm of unspecified site of right female breast: Secondary | ICD-10-CM

## 2023-02-25 DIAGNOSIS — Z8673 Personal history of transient ischemic attack (TIA), and cerebral infarction without residual deficits: Secondary | ICD-10-CM

## 2023-02-25 DIAGNOSIS — M0609 Rheumatoid arthritis without rheumatoid factor, multiple sites: Secondary | ICD-10-CM

## 2023-02-25 DIAGNOSIS — Z8679 Personal history of other diseases of the circulatory system: Secondary | ICD-10-CM

## 2023-02-26 ENCOUNTER — Other Ambulatory Visit: Payer: Self-pay

## 2023-02-26 NOTE — Progress Notes (Signed)
Specialty Pharmacy Refill Coordination Note  [name] is a 77 y.o. female contacted today regarding refills of specialty medication(s) Abatacept (Orencia ClickJect)   Patient requested Delivery   Delivery date: 03/06/23   Verified address: [address] [facility] [address]   Medication will be filled on 03/05/23.

## 2023-02-26 NOTE — Progress Notes (Unsigned)
 Office Visit Note  Patient: [name]             Date of Birth: Jul 29, 1945           MRN: [id number]             PCP: Amil Amen, MD Referring: Amil Amen, MD Visit Date: 03/12/2023 Occupation: @GUAROCC @  Subjective:  Medication monitoring  History of Present Illness: [name] is a 78 y.o. female with history of seronegative rheumatoid arthritis and osteoarthritis.  Patient remains on  Orencia 125 mg sq injections every 7 days. Patient initiated Orencia on 09/15/2020.  She is tolerating Orencia without any side effects or injection site reactions.  She has not any recent gaps in therapy.  She denies any rheumatoid arthritis flares.  Patient denies any joint swelling at this time.  She has noted some increased neck stiffness particularly when driving.  Patient states that her knee replacements have been doing well.  She states that she continues to have some issues with balance and has been using a rollator walker to assist with ambulation.  She has been taking tramadol as needed for pain relief and does not need any refills at this time.  She denies any new medical conditions.  She has not had any recent or recurrent infections.    Activities of Daily Living:  Patient reports morning stiffness for 10 minutes.   Patient Reports nocturnal pain.  Difficulty dressing/grooming: Reports Difficulty climbing stairs: Reports Difficulty getting out of chair: Reports Difficulty using hands for taps, buttons, cutlery, and/or writing: Reports  Review of Systems  Constitutional:  Positive for fatigue.  HENT:  Positive for mouth dryness. Negative for mouth sores and nose dryness.   Eyes:  Negative for pain and dryness.  Respiratory:  Negative for shortness of breath and difficulty breathing.   Cardiovascular:  Negative for chest pain and palpitations.  Gastrointestinal:  Negative for blood in stool, constipation and diarrhea.  Endocrine: Negative for increased  urination.  Genitourinary:  Negative for involuntary urination.  Musculoskeletal:  Positive for joint pain, gait problem, joint pain, joint swelling, myalgias, morning stiffness, muscle tenderness and myalgias. Negative for muscle weakness.  Skin:  Negative for color change, rash, hair loss and sensitivity to sunlight.  Allergic/Immunologic: Negative for susceptible to infections.  Neurological:  Positive for dizziness. Negative for headaches.  Hematological:  Negative for swollen glands.  Psychiatric/Behavioral:  Negative for depressed mood and sleep disturbance. The patient is not nervous/anxious.     PMFS History:  Patient Active Problem List   Diagnosis Date Noted   Unilateral primary osteoarthritis, right knee 11/18/2018   Closed fracture of shaft of metatarsal bone 07/30/2016   Metatarsal fracture 07/30/2016   Displaced fracture of second metatarsal bone, right foot, initial encounter for closed fracture 07/26/2016   Arthritis of shoulder 04/10/2016   High risk medication use 03/16/2016   Primary osteoarthritis of both feet 03/16/2016   Primary osteoarthritis of both knees 03/16/2016   DDD (degenerative disc disease), lumbar 03/16/2016   OSA on CPAP 07/27/2015   Lip numbness 03/19/2015   TIA (transient ischemic attack) 03/19/2015   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HCC) 02/06/2015   Post op infection 02/05/2015   History of lumbar laminectomy for spinal cord decompression 01/17/2015   Lumbar spinal stenosis 01/17/2015   Primary osteoarthritis of left knee 03/25/2014   Rheumatoid arthritis of multiple sites with negative rheumatoid factor (HCC) 03/25/2014   History of total knee replacement, left 03/25/2014  DERMATITIS 09/28/2008   Constipation 05/25/2008   DYSPNEA ON EXERTION 05/25/2008   ANEMIA, NORMOCYTIC 04/13/2008   ANKLE PAIN, BILATERAL 11/25/2007   HLD (hyperlipidemia) 08/14/2006   Morbid (severe) obesity due to excess calories (HCC) 12/13/2005   CARPAL TUNNEL SYNDROME  12/13/2005   Hereditary and idiopathic peripheral neuropathy 12/13/2005   Essential hypertension 12/13/2005   Cardiac dysrhythmia 12/13/2005    Past Medical History:  Diagnosis Date   Anemia    Arthritis    Rheumatoid, followed by Dr. Corliss Skains, knees & ankles    Cancer (HCC)    CHF (congestive heart failure) (HCC)    pt not aware of this   Constipation    GERD (gastroesophageal reflux disease)    H/O cardiovascular stress test 02/2014   seen by Dr. Jacinto Halim - told that she was cleared for surgery   Heart murmur    states she's never had any problems   Hypertension    Leg cramps    Shortness of breath dyspnea    with exertion   Sleep apnea    CPAP in use- QO night, study done at [facility]- 2007    Stroke [facility]) 03/2015   TIA    Family History  Problem Relation Age of Onset   Seizures Mother    Transient ischemic attack Mother    Hypertension Mother    Diabetes Mother    Congestive Heart Failure Mother    Arthritis/Rheumatoid Mother    Parkinson's disease Mother    Dementia Mother    Heart disease Father        CHF   Stroke Sister    Depression Sister    Arthritis Brother    Arthritis Brother    Arthritis Son    Hypertension Son    Past Surgical History:  Procedure Laterality Date   ABDOMINAL HYSTERECTOMY     CARPAL TUNNEL RELEASE Right    COLONOSCOPY     COLONOSCOPY WITH PROPOFOL N/A 11/13/2019   Procedure: COLONOSCOPY WITH PROPOFOL;  Surgeon: Jeani Hawking, MD;  Location: [facility];  Service: Endoscopy;  Laterality: N/A;   ESOPHAGOGASTRODUODENOSCOPY (EGD) WITH PROPOFOL N/A 11/13/2019   Procedure: ESOPHAGOGASTRODUODENOSCOPY (EGD) WITH PROPOFOL;  Surgeon: Jeani Hawking, MD;  Location: [facility];  Service: Endoscopy;  Laterality: N/A;   FOOT SURGERY Bilateral    bunionectomy, calluses , also has several pins & screws   HAND SURGERY     JOINT REPLACEMENT     planned for 03/25/2014   KNEE ARTHROPLASTY     KNEE SURGERY  1990's    LUMBAR LAMINECTOMY/DECOMPRESSION  MICRODISCECTOMY N/A 01/17/2015   Procedure: L2-3, L3-4 Decompression;  Surgeon: Eldred Manges, MD;  Location: [facility] OR;  Service: Orthopedics;  Laterality: N/A;   LUMBAR LAMINECTOMY/DECOMPRESSION MICRODISCECTOMY N/A 02/06/2015   Procedure: I AND D LUMBAR WITH WOUND VAC PLACEMENT;  Surgeon: Eldred Manges, MD;  Location: [facility] OR;  Service: Orthopedics;  Laterality: N/A;   MASTECTOMY Right 02/01/2020   ORIF TOE FRACTURE Right 07/30/2016   Procedure: OPEN REDUCTION INTERNAL FIXATION (ORIF) RIGHT 2ND METATARSAL (TOE) FRACTURE;  Surgeon: Eldred Manges, MD;  Location: [facility] OR;  Service: Orthopedics;  Laterality: Right;   POLYPECTOMY  11/13/2019   Procedure: POLYPECTOMY;  Surgeon: Jeani Hawking, MD;  Location: [facility];  Service: Endoscopy;;   REPLACEMENT TOTAL KNEE Right 08/02/2022   TOTAL KNEE ARTHROPLASTY Left 03/25/2014   Procedure: TOTAL KNEE ARTHROPLASTY;  Surgeon: Valeria Batman, MD;  Location: [facility] OR;  Service: Orthopedics;  Laterality: Left;   TUBAL LIGATION  Social History   Social History Narrative   Lives with husband.  2 Sons.  Using walker.    Caffeine  Less then 1 cups daily.   Retired.     Immunization History  Administered Date(s) Administered   H1N1 12/23/2007   Influenza Split 11/03/2014   Influenza Whole 12/19/2005, 11/13/2006   Influenza,inj,Quad PF,6+ Mos 09/16/2017   Influenza-Unspecified 11/03/2014   PFIZER Comirnaty(Gray Top)Covid-19 Tri-Sucrose Vaccine 10/07/2019   PFIZER(Purple Top)SARS-COV-2 Vaccination 01/28/2019, 02/18/2019   Pneumococcal Polysaccharide-23 12/19/2005   Tdap 12/20/2011     Objective: Vital Signs: BP 111/74 (BP Location: Left Arm, Patient Position: Sitting, Cuff Size: Large)   Pulse 76   Resp 13   Ht 4\' 11"  (1.499 m)   Wt 198 lb (89.8 kg)   BMI 39.99 kg/m    Physical Exam Vitals and nursing note reviewed.  Constitutional:      Appearance: She is well-developed.  HENT:     Head: Normocephalic and atraumatic.  Eyes:      Conjunctiva/sclera: Conjunctivae normal.  Cardiovascular:     Rate and Rhythm: Normal rate and regular rhythm.     Heart sounds: Normal heart sounds.  Pulmonary:     Effort: Pulmonary effort is normal.     Breath sounds: Normal breath sounds.  Abdominal:     General: Bowel sounds are normal.     Palpations: Abdomen is soft.  Musculoskeletal:     Cervical back: Normal range of motion.  Lymphadenopathy:     Cervical: No cervical adenopathy.  Skin:    General: Skin is warm and dry.     Capillary Refill: Capillary refill takes less than 2 seconds.  Neurological:     Mental Status: She is alert and oriented to person, place, and time.  Psychiatric:        Behavior: Behavior normal.      Musculoskeletal Exam: Patient remained seated during examination.  C-spine has limited range of motion with lateral rotation.  Shoulder joint abduction to about 90 degrees bilaterally.  Elbow joints have good range of motion.  Synovial thickening of both wrist joints.  Synovial thickening of MCP joints but no synovitis.  PIP and DIP thickening noted.  Hip joints able to assess in seated position.  Bilateral knee replacements have good range of motion with warmth but no effusion noted.  Synovial thickening of both ankle joints with subluxation bilaterally.  CDAI Exam: CDAI Score: -- Patient Global: 10 / 100; Provider Global: 10 / 100 Swollen: --; Tender: -- Joint Exam 03/12/2023   No joint exam has been documented for this visit   There is currently no information documented on the homunculus. Go to the Rheumatology activity and complete the homunculus joint exam.  Investigation: No additional findings.  Imaging: No results found.  Recent Labs: Lab Results  Component Value Date   WBC 5.8 12/13/2022   HGB 11.8 12/13/2022   PLT 299 12/13/2022   NA 144 12/13/2022   K 4.2 12/13/2022   CL 105 12/13/2022   CO2 24 12/13/2022   GLUCOSE 74 12/13/2022   BUN 12 12/13/2022   CREATININE 0.92  12/13/2022   BILITOT 0.3 12/13/2022   ALKPHOS 138 (H) 12/13/2022   AST 19 12/13/2022   ALT 9 12/13/2022   PROT 7.2 12/13/2022   ALBUMIN 4.2 12/13/2022   CALCIUM 9.5 12/13/2022   GFRAA 62 11/16/2019   QFTBGOLDPLUS Negative 01/10/2022    Speciality Comments: Narcotic Agreement 10/30/16 Humira stopped 01/18/20 d/t chemo/radiation Orencia started 09/15/20  Procedures:  No procedures performed Allergies: Ramipril    Assessment / Plan:     Visit Diagnoses: Rheumatoid arthritis of multiple sites with negative rheumatoid factor (HCC): She has no synovitis on examination today.  She has not had any signs or symptoms of a rheumatoid arthritis flare.  She has clinically been doing well on Orencia 125 mg subcutaneous injections once weekly.  She is tolerating Orencia without any side effects or injection site reactions.  She has not had any recent gaps in therapy.  She continues to take tramadol as needed for pain relief.  UDS updated today.  No medication changes will be made at this time.  She was advised to notify us if she develops signs or symptoms of a flare.  She will follow-up in the office in 5 months or sooner if needed. - Plan: Lipid panel  High risk medication use - Orencia 125 mg sq injections every 7 days.  Patient initiated Orencia on 09/15/2020.  Previously held Humira prior to starting Chemotherapy and radiation therapy.  CBC and CMP updated on 12/13/22.  Orders for CBC and CMP released today.  TB gold negative on 01/10/22. Order for TB gold released today.   Lipid panel updated on 04/16/22. Patient is not currently fasting-future order for lipid panel placed today to be drawn with next lab work.  No recent or recurrent infections.  Discussed the importance of holding orencia if she develops signs or symptoms of an infection and to resume once the infection has completley cleared.  - Plan: CBC with Differential/Platelet, COMPLETE METABOLIC PANEL WITH GFR, QuantiFERON-TB Gold  Plus  Screening for tuberculosis -Order for TB gold released today.   plan: QuantiFERON-TB Gold Plus  Chronic prescription opiate use -UDS and narcotic agreement were updated today plan: DRUG MONITOR, TRAMADOL,QN, URINE, DRUG MONITOR, PANEL 5, W/CONF, URINE  Medication monitoring encounter -UDS and narcotic agreement were updated today.  Plan: DRUG MONITOR, TRAMADOL,QN, URINE, DRUG MONITOR, PANEL 5, W/CONF, URINE  Trigger finger, right middle finger: Not currently symptomatic   History of total knee replacement, bilateral - Left total knee replacement--03/25/2014. Right total knee replacement 08/05/2022.  Warmth but no effusion noted.  Using a rollator walker to assist with ambulation.  She has issues with balance and had a fall in December 2024 and January 2025.  Offered referral to physical therapy for lower extremity muscle strengthening and fall prevention but she would like to hold off at this time.  Primary osteoarthritis of both feet: She is not experiencing any increased discomfort in her feet at this time.  Patient is wearing proper fitting shoes.    Neck pain: Patient has been experiencing increased pain and stiffness in the cervical spine particularly with lateral rotation while driving.  On examination today she has good flexion extension but has limited lateral rotation especially to the right.  Patient was given a handout of exercises to perform.  Offered referral to physical therapy.  She will notify us if her symptoms persist or worsen.  Degeneration of intervertebral disc of lumbar region without discogenic back pain or lower extremity pain - Lumbar laminectomy and decompression microdiscectomy x 2.  Using a rollator walker to assist with ambulation.  Other medical conditions are listed as follows:   History of hypertension - BP was 111/74 today in the office.  History of hyperlipidemia - Future order for lipid panel placed today.  Plan: Lipid panel  History of TIA (transient  ischemic attack)  Invasive ductal carcinoma of right breast (HCC)  History  of sleep apnea   Orders: Orders Placed This Encounter  Procedures   CBC with Differential/Platelet   COMPLETE METABOLIC PANEL WITH GFR   QuantiFERON-TB Gold Plus   DRUG MONITOR, TRAMADOL,QN, URINE   DRUG MONITOR, PANEL 5, W/CONF, URINE   Lipid panel   No orders of the defined types were placed in this encounter.    Follow-Up Instructions: Return in about 5 months (around 08/12/2023) for Rheumatoid arthritis, Osteoarthritis.   Gearldine Bienenstock, PA-C  Note - This record has been created using Dragon software.  Chart creation errors have been sought, but may not always  have been located. Such creation errors do not reflect on  the standard of medical care.

## 2023-03-05 ENCOUNTER — Other Ambulatory Visit: Payer: Self-pay

## 2023-03-12 ENCOUNTER — Ambulatory Visit: Payer: Medicare PPO | Attending: Physician Assistant | Admitting: Physician Assistant

## 2023-03-12 ENCOUNTER — Encounter: Payer: Self-pay | Admitting: Physician Assistant

## 2023-03-12 ENCOUNTER — Telehealth: Payer: Self-pay

## 2023-03-12 VITALS — BP 111/74 | HR 76 | Resp 13 | Ht 59.0 in | Wt 198.0 lb

## 2023-03-12 DIAGNOSIS — M0609 Rheumatoid arthritis without rheumatoid factor, multiple sites: Secondary | ICD-10-CM

## 2023-03-12 DIAGNOSIS — Z8639 Personal history of other endocrine, nutritional and metabolic disease: Secondary | ICD-10-CM

## 2023-03-12 DIAGNOSIS — Z8673 Personal history of transient ischemic attack (TIA), and cerebral infarction without residual deficits: Secondary | ICD-10-CM

## 2023-03-12 DIAGNOSIS — Z111 Encounter for screening for respiratory tuberculosis: Secondary | ICD-10-CM

## 2023-03-12 DIAGNOSIS — M542 Cervicalgia: Secondary | ICD-10-CM

## 2023-03-12 DIAGNOSIS — M1711 Unilateral primary osteoarthritis, right knee: Secondary | ICD-10-CM

## 2023-03-12 DIAGNOSIS — Z79899 Other long term (current) drug therapy: Secondary | ICD-10-CM

## 2023-03-12 DIAGNOSIS — Z5181 Encounter for therapeutic drug level monitoring: Secondary | ICD-10-CM

## 2023-03-12 DIAGNOSIS — Z79891 Long term (current) use of opiate analgesic: Secondary | ICD-10-CM

## 2023-03-12 DIAGNOSIS — M19071 Primary osteoarthritis, right ankle and foot: Secondary | ICD-10-CM

## 2023-03-12 DIAGNOSIS — M51369 Other intervertebral disc degeneration, lumbar region without mention of lumbar back pain or lower extremity pain: Secondary | ICD-10-CM

## 2023-03-12 DIAGNOSIS — Z8669 Personal history of other diseases of the nervous system and sense organs: Secondary | ICD-10-CM

## 2023-03-12 DIAGNOSIS — M19072 Primary osteoarthritis, left ankle and foot: Secondary | ICD-10-CM

## 2023-03-12 DIAGNOSIS — M65331 Trigger finger, right middle finger: Secondary | ICD-10-CM | POA: Diagnosis not present

## 2023-03-12 DIAGNOSIS — Z96653 Presence of artificial knee joint, bilateral: Secondary | ICD-10-CM

## 2023-03-12 DIAGNOSIS — Z8679 Personal history of other diseases of the circulatory system: Secondary | ICD-10-CM

## 2023-03-12 DIAGNOSIS — C50911 Malignant neoplasm of unspecified site of right female breast: Secondary | ICD-10-CM

## 2023-03-12 NOTE — Patient Instructions (Addendum)
 Standing Labs We placed an order today for your standing lab work.   Please have your standing labs drawn in June and every 3 months   Please have your labs drawn 2 weeks prior to your appointment so that the provider can discuss your lab results at your appointment, if possible.  Please note that you may see your imaging and lab results in MyChart before we have reviewed them. We will contact you once all results are reviewed. Please allow our office up to 72 hours to thoroughly review all of the results before contacting the office for clarification of your results.  WALK-IN LAB HOURS  Monday through Thursday from 8:00 am -12:30 pm and 1:00 pm-5:00 pm and Friday from 8:00 am-12:00 pm.  Patients with office visits requiring labs will be seen before walk-in labs.  You may encounter longer than normal wait times. Please allow additional time. Wait times may be shorter on  Monday and Thursday afternoons.  We do not book appointments for walk-in labs. We appreciate your patience and understanding with our staff.   Labs are drawn by [facility]. Please bring your co-pay at the time of your lab draw.  You may receive a bill from [facility] for your lab work.  Please note if you are on Hydroxychloroquine and and an order has been placed for a Hydroxychloroquine level,  you will need to have it drawn 4 hours or more after your last dose.  If you wish to have your labs drawn at another location, please call the office 24 hours in advance so we can fax the orders.  The office is located at [address], [place], [place] [address]   If you have any questions regarding directions or hours of operation,  please call [phone].   As a reminder, please drink plenty of water prior to coming for your lab work. Thanks!  Neck Exercises Ask your health care provider which exercises are safe for you. Do exercises exactly as told by your health care provider and adjust them as directed. It is normal  to feel mild stretching, pulling, tightness, or discomfort as you do these exercises. Stop right away if you feel sudden pain or your pain gets worse. Do not begin these exercises until told by your health care provider. Neck exercises can be important for many reasons. They can improve strength and maintain flexibility in your neck, which will help your upper back and prevent neck pain. Stretching exercises Rotation neck stretching  Sit in a chair or stand up. Place your feet flat on the floor, shoulder-width apart. Slowly turn your head (rotate) to the right until a slight stretch is felt. Turn it all the way to the right so you can look over your right shoulder. Do not tilt or tip your head. Hold this position for 10-30 seconds. Slowly turn your head (rotate) to the left until a slight stretch is felt. Turn it all the way to the left so you can look over your left shoulder. Do not tilt or tip your head. Hold this position for 10-30 seconds. Repeat __________ times. Complete this exercise __________ times a day. Neck retraction  Sit in a sturdy chair or stand up. Look straight ahead. Do not bend your neck. Use your fingers to push your chin backward (retraction). Do not bend your neck for this movement. Continue to face straight ahead. If you are doing the exercise properly, you will feel a slight sensation in your throat and a stretch  at the back of your neck. Hold the stretch for 1-2 seconds. Repeat __________ times. Complete this exercise __________ times a day. Strengthening exercises Neck press  Lie on your back on a firm bed or on the floor with a pillow under your head. Use your neck muscles to push your head down on the pillow and straighten your spine. Hold the position as well as you can. Keep your head facing up (in a neutral position) and your chin tucked. Slowly count to 5 while holding this position. Repeat __________ times. Complete this exercise __________ times a  day. Isometrics These are exercises in which you strengthen the muscles in your neck while keeping your neck still (isometrics). Sit in a supportive chair and place your hand on your forehead. Keep your head and face facing straight ahead. Do not flex or extend your neck while doing isometrics. Push forward with your head and neck while pushing back with your hand. Hold for 10 seconds. Do the sequence again, this time putting your hand against the back of your head. Use your head and neck to push backward against the hand pressure. Finally, do the same exercise on either side of your head, pushing sideways against the pressure of your hand. Repeat __________ times. Complete this exercise __________ times a day. Prone head lifts  Lie face-down (prone position), resting on your elbows so that your chest and upper back are raised. Start with your head facing downward, near your chest. Position your chin either on or near your chest. Slowly lift your head upward. Lift until you are looking straight ahead. Then continue lifting your head as far back as you can comfortably stretch. Hold your head up for 5 seconds. Then slowly lower it to your starting position. Repeat __________ times. Complete this exercise __________ times a day. Supine head lifts  Lie on your back (supine position), bending your knees to point to the ceiling and keeping your feet flat on the floor. Lift your head slowly off the floor, raising your chin toward your chest. Hold for 5 seconds. Repeat __________ times. Complete this exercise __________ times a day. Scapular retraction  Stand with your arms at your sides. Look straight ahead. Slowly pull both shoulders (scapulae) backward and downward (retraction) until you feel a stretch between your shoulder blades in your upper back. Hold for 10-30 seconds. Relax and repeat. Repeat __________ times. Complete this exercise __________ times a day. Contact a health care provider  if: Your neck pain or discomfort gets worse when you do an exercise. Your neck pain or discomfort does not improve within 2 hours after you exercise. If you have any of these problems, stop exercising right away. Do not do the exercises again unless your health care provider says that you can. Get help right away if: You develop sudden, severe neck pain. If this happens, stop exercising right away. Do not do the exercises again unless your health care provider says that you can. This information is not intended to replace advice given to you by your health care provider. Make sure you discuss any questions you have with your health care provider. Document Revised: 06/21/2020 Document Reviewed: 06/21/2020 Elsevier Patient Education  2024 [employer].

## 2023-03-12 NOTE — Addendum Note (Signed)
 Addended by: Ellen Henri on: 03/12/2023 02:15 PM   Modules accepted: Orders

## 2023-03-12 NOTE — Telephone Encounter (Addendum)
 UDS has been placed and released for [facility] as patient was not able to provide enough urine to process today.

## 2023-03-13 ENCOUNTER — Other Ambulatory Visit: Payer: Self-pay | Admitting: Physician Assistant

## 2023-03-13 ENCOUNTER — Other Ambulatory Visit: Payer: Self-pay | Admitting: *Deleted

## 2023-03-13 DIAGNOSIS — Z5181 Encounter for therapeutic drug level monitoring: Secondary | ICD-10-CM

## 2023-03-13 DIAGNOSIS — Z79899 Other long term (current) drug therapy: Secondary | ICD-10-CM

## 2023-03-13 DIAGNOSIS — Z79891 Long term (current) use of opiate analgesic: Secondary | ICD-10-CM

## 2023-03-13 NOTE — Progress Notes (Signed)
 CBC WNL Creatinine is elevated-1.06 and GFR is low-54.  Rest of CMP WNL.  Avoid the use of NSAIDs.

## 2023-03-13 NOTE — Telephone Encounter (Signed)
 Last Fill: 02/08/2023  UDS:09/24/2022 UDS is consistent with tramadol use. (Patient plans to update tomorrow)  Narc Agreement: 03/12/2023   Next Visit: 08/14/2023  Last Visit: 03/12/2023  Dx:  Rheumatoid arthritis of multiple sites with negative rheumatoid factor    Current Dose per office note on 03/12/2023:continues to take tramadol as needed for pain relief.    Okay to refill Tramadol?

## 2023-03-14 LAB — COMPLETE METABOLIC PANEL WITH GFR
AG Ratio: 1.4 (calc) (ref 1.0–2.5)
ALT: 12 U/L (ref 6–29)
AST: 19 U/L (ref 10–35)
Albumin: 4.2 g/dL (ref 3.6–5.1)
Alkaline phosphatase (APISO): 112 U/L (ref 37–153)
BUN/Creatinine Ratio: 16 (calc) (ref 6–22)
BUN: 17 mg/dL (ref 7–25)
CO2: 31 mmol/L (ref 20–32)
Calcium: 10 mg/dL (ref 8.6–10.4)
Chloride: 104 mmol/L (ref 98–110)
Creat: 1.06 mg/dL — ABNORMAL HIGH (ref 0.60–1.00)
Globulin: 3 g/dL (ref 1.9–3.7)
Glucose, Bld: 73 mg/dL (ref 65–99)
Potassium: 4.7 mmol/L (ref 3.5–5.3)
Sodium: 142 mmol/L (ref 135–146)
Total Bilirubin: 0.4 mg/dL (ref 0.2–1.2)
Total Protein: 7.2 g/dL (ref 6.1–8.1)
eGFR: 54 mL/min/{1.73_m2} — ABNORMAL LOW (ref >=60)

## 2023-03-14 LAB — QUANTIFERON-TB GOLD PLUS
Mitogen-NIL: 8.73 [IU]/mL
NIL: 0.04 [IU]/mL
QuantiFERON-TB Gold Plus: NEGATIVE
TB1-NIL: 0.01 [IU]/mL
TB2-NIL: 0 [IU]/mL

## 2023-03-14 LAB — CBC WITH DIFFERENTIAL/PLATELET
Absolute Lymphocytes: 1588 {cells}/uL (ref 850–3900)
Absolute Monocytes: 481 {cells}/uL (ref 200–950)
Basophils Absolute: 38 {cells}/uL (ref 0–200)
Basophils Relative: 0.7 %
Eosinophils Absolute: 108 {cells}/uL (ref 15–500)
Eosinophils Relative: 2 %
HCT: 37.3 % (ref 35.0–45.0)
Hemoglobin: 12 g/dL (ref 11.7–15.5)
MCH: 28.6 pg (ref 27.0–33.0)
MCHC: 32.2 g/dL (ref 32.0–36.0)
MCV: 89 fL (ref 80.0–100.0)
MPV: 10.6 fL (ref 7.5–12.5)
Monocytes Relative: 8.9 %
Neutro Abs: 3186 {cells}/uL (ref 1500–7800)
Neutrophils Relative %: 59 %
Platelets: 276 10*3/uL (ref 140–400)
RBC: 4.19 Million/uL (ref 3.80–5.10)
RDW: 12.9 % (ref 11.0–15.0)
Total Lymphocyte: 29.4 %
WBC: 5.4 10*3/uL (ref 3.8–10.8)

## 2023-03-14 NOTE — Progress Notes (Signed)
 TB gold negative

## 2023-03-16 LAB — DRUG PROFILE, UR, 9 DRUGS (LABCORP)
Amphetamines, Urine: NEGATIVE ng/mL
Barbiturate Quant, Ur: NEGATIVE ng/mL
Benzodiazepine Quant, Ur: NEGATIVE ng/mL
Cannabinoid Quant, Ur: NEGATIVE ng/mL
Cocaine (Metab.): NEGATIVE ng/mL
Methadone Screen, Urine: NEGATIVE ng/mL
Opiate Quant, Ur: NEGATIVE ng/mL
PCP Quant, Ur: NEGATIVE ng/mL
Propoxyphene: NEGATIVE ng/mL

## 2023-03-16 LAB — TRAMADOL SCREEN, URINE: Tramadol Screen, Urine: POSITIVE ng/mL — AB

## 2023-03-18 NOTE — Progress Notes (Signed)
 Uds consistent with treatment

## 2023-03-22 ENCOUNTER — Other Ambulatory Visit: Payer: Self-pay

## 2023-03-25 ENCOUNTER — Other Ambulatory Visit: Payer: Self-pay

## 2023-03-25 ENCOUNTER — Other Ambulatory Visit: Payer: Self-pay | Admitting: Physician Assistant

## 2023-03-25 DIAGNOSIS — M0609 Rheumatoid arthritis without rheumatoid factor, multiple sites: Secondary | ICD-10-CM

## 2023-03-25 MED ORDER — ORENCIA CLICKJECT 125 MG/ML ~~LOC~~ SOAJ
125.0000 mg | SUBCUTANEOUS | 0 refills | Status: DC
  Filled 2023-03-25: qty 4, 28d supply, fill #0
  Filled 2023-04-24 (×2): qty 4, 28d supply, fill #1
  Filled 2023-05-16: qty 4, 28d supply, fill #2

## 2023-03-25 NOTE — Progress Notes (Signed)
 Specialty Pharmacy Refill Coordination Note  [name] is a 78 y.o. female contacted today regarding refills of specialty medication(s) Abatacept (Orencia ClickJect)   Patient requested (Patient-Rptd) Delivery   Delivery date: (Patient-Rptd) 04/03/23   Verified address: (Patient-Rptd) [address] [place], [place] [address]   Medication will be filled on 03.25.25.

## 2023-03-25 NOTE — Telephone Encounter (Signed)
 Last Fill: 12/11/2022   Labs: 03/12/2023 CBC WNL Creatinine is elevated-1.06 and GFR is low-54.  Rest of CMP WNL.  Avoid the use of NSAIDs.  TB Gold: 03/12/2023   TB gold negative   Next Visit: 08/14/2023   Last Visit: 03/12/2023   XB:[id number] arthritis of multiple sites with negative rheumatoid factor   Current Dose per office note 03/12/2023 : Orencia 125 mg sq injections every 7 days.   Okay to refill Orencia?

## 2023-04-12 ENCOUNTER — Other Ambulatory Visit: Payer: Self-pay | Admitting: Rheumatology

## 2023-04-12 DIAGNOSIS — Z79899 Other long term (current) drug therapy: Secondary | ICD-10-CM

## 2023-04-12 DIAGNOSIS — Z5181 Encounter for therapeutic drug level monitoring: Secondary | ICD-10-CM

## 2023-04-12 NOTE — Telephone Encounter (Signed)
 Last Fill: 03/13/2023  UDS:03/14/2023  Uds consistent with treatment   Narc Agreement: 03/12/2023  Next Visit: 08/14/2023  Last Visit: 03/12/2023  Dx:  Rheumatoid arthritis of multiple sites with negative rheumatoid factor   Current Dose per office note on 03/12/2023 :tramadol as needed for pain relief    Okay to refill tramadol?

## 2023-04-23 ENCOUNTER — Other Ambulatory Visit: Payer: Self-pay

## 2023-04-24 ENCOUNTER — Other Ambulatory Visit (HOSPITAL_COMMUNITY): Payer: Self-pay

## 2023-04-24 ENCOUNTER — Other Ambulatory Visit: Payer: Self-pay

## 2023-04-24 NOTE — Progress Notes (Signed)
 Specialty Pharmacy Refill Coordination Note  [name] is a 78 y.o. female contacted today regarding refills of specialty medication(s) Orencia.  Patient requested (Patient-Rptd) Delivery   Delivery date: (Patient-Rptd) 05/02/23   Verified address: (Patient-Rptd) [address] [place] [address]   Medication will be filled on 05/01/23.

## 2023-04-25 ENCOUNTER — Telehealth: Payer: Self-pay | Admitting: *Deleted

## 2023-04-25 NOTE — Telephone Encounter (Signed)
 Patient contacted the office and left message requesting her TB Gold results to be sent to [facility]. [facility] states [facility] and they need a copy of those results. Fax number [phone].  Results faxed as requested and patient notified.

## 2023-05-01 ENCOUNTER — Other Ambulatory Visit: Payer: Self-pay

## 2023-05-15 ENCOUNTER — Other Ambulatory Visit: Payer: Self-pay | Admitting: Physician Assistant

## 2023-05-15 DIAGNOSIS — Z79899 Other long term (current) drug therapy: Secondary | ICD-10-CM

## 2023-05-15 DIAGNOSIS — Z5181 Encounter for therapeutic drug level monitoring: Secondary | ICD-10-CM

## 2023-05-16 ENCOUNTER — Other Ambulatory Visit: Payer: Self-pay

## 2023-05-16 ENCOUNTER — Other Ambulatory Visit: Payer: Self-pay | Admitting: Pharmacy Technician

## 2023-05-16 NOTE — Progress Notes (Signed)
 Specialty Pharmacy Refill Coordination Note  [name] is a 77 y.o. female contacted today regarding refills of specialty medication(s) Abatacept  (Orencia  ClickJect)   Patient requested Delivery   Delivery date: 05/28/23   Verified address: [address]  [place] [address]   Medication will be filled on 05/27/23.

## 2023-05-16 NOTE — Telephone Encounter (Signed)
 Last Fill: 04/13/2023   UDS:03/14/2023  Uds consistent with treatment    Narc Agreement: 03/12/2023   Next Visit: 08/14/2023   Last Visit: 03/12/2023   Dx:  Rheumatoid arthritis of multiple sites with negative rheumatoid factor    Current Dose per office note on 03/12/2023 :tramadol  as needed for pain relief      Okay to refill tramadol ?

## 2023-06-10 ENCOUNTER — Other Ambulatory Visit: Payer: Self-pay

## 2023-06-13 ENCOUNTER — Other Ambulatory Visit: Payer: Self-pay

## 2023-06-13 NOTE — Progress Notes (Signed)
 Specialty Pharmacy Ongoing Clinical Assessment Note  [name] is a 78 y.o. female who is being followed by the specialty pharmacy service for RxSp Rheumatoid Arthritis   Patient's specialty medication(s) reviewed today: Abatacept  (Orencia  ClickJect)   Missed doses in the last 4 weeks: 0   Patient/Caregiver did not have any additional questions or concerns.   Therapeutic benefit summary: Patient is achieving benefit   Adverse events/side effects summary: No adverse events/side effects   Patient's therapy is appropriate to: Continue    Goals Addressed             This Visit's Progress    Reduce signs and symptoms   On track    Patient is on track. Patient will maintain adherence. Patient reported that she is doing quite well on the medication. She does have a small knot on her right wrist at the moment, but otherwise doing well.         Follow up: 12 months  [facility]

## 2023-06-14 ENCOUNTER — Other Ambulatory Visit: Payer: Self-pay | Admitting: Rheumatology

## 2023-06-14 DIAGNOSIS — Z5181 Encounter for therapeutic drug level monitoring: Secondary | ICD-10-CM

## 2023-06-14 DIAGNOSIS — Z79899 Other long term (current) drug therapy: Secondary | ICD-10-CM

## 2023-06-14 NOTE — Telephone Encounter (Signed)
 Last Fill: 03/15/2023  UDS:03/14/2023 Uds consistent with treatment   Narc Agreement: 03/14/2023  Next Visit: 08/14/2023  Last Visit: 03/12/2023  Dx: Rheumatoid arthritis of multiple sites with negative rheumatoid factor    Current Dose per office note on 03/12/2023:continues to take tramadol  as needed for pain relief.    Okay to refill Tramadol ?

## 2023-06-18 ENCOUNTER — Other Ambulatory Visit: Payer: Self-pay | Admitting: Physician Assistant

## 2023-06-18 ENCOUNTER — Other Ambulatory Visit: Payer: Self-pay

## 2023-06-18 ENCOUNTER — Other Ambulatory Visit (HOSPITAL_COMMUNITY): Payer: Self-pay

## 2023-06-18 DIAGNOSIS — M0609 Rheumatoid arthritis without rheumatoid factor, multiple sites: Secondary | ICD-10-CM

## 2023-06-18 DIAGNOSIS — Z79899 Other long term (current) drug therapy: Secondary | ICD-10-CM

## 2023-06-18 MED ORDER — ORENCIA CLICKJECT 125 MG/ML ~~LOC~~ SOAJ
125.0000 mg | SUBCUTANEOUS | 0 refills | Status: DC
  Filled 2023-06-18: qty 12, 84d supply, fill #0
  Filled 2023-06-21: qty 4, 28d supply, fill #0
  Filled 2023-07-17: qty 4, 28d supply, fill #1
  Filled 2023-08-14: qty 4, 28d supply, fill #2

## 2023-06-18 NOTE — Telephone Encounter (Signed)
 Last Fill: 03/25/2023  Labs: 03/12/2023 CBC WNL Creatinine is elevated-1.06 and GFR is low-54.  Rest of CMP WNL.  Released labs to be done at [facility] tomorrow.   TB Gold: 03/12/2023 TB Gold Negative   Next Visit: 08/14/2023  Last Visit: 03/12/2023  DX: Rheumatoid arthritis of multiple sites with negative rheumatoid factor   Current Dose per office note 03/12/2023: Orencia  125 mg subcutaneous injections once weekly.   Okay to refill Orencia ?

## 2023-06-21 ENCOUNTER — Other Ambulatory Visit: Payer: Self-pay

## 2023-06-21 NOTE — Progress Notes (Signed)
 Specialty Pharmacy Refill Coordination Note  [name] is a 78 y.o. female contacted today regarding refills of specialty medication(s) Abatacept  (Orencia  ClickJect)   Patient requested Delivery   Delivery date: 06/25/23   Verified address: [address]  [place] [address]   Medication will be filled on 06/24/23.

## 2023-06-24 ENCOUNTER — Other Ambulatory Visit: Payer: Self-pay

## 2023-07-15 ENCOUNTER — Other Ambulatory Visit: Payer: Self-pay | Admitting: Rheumatology

## 2023-07-15 ENCOUNTER — Other Ambulatory Visit (HOSPITAL_COMMUNITY): Payer: Self-pay

## 2023-07-15 DIAGNOSIS — Z5181 Encounter for therapeutic drug level monitoring: Secondary | ICD-10-CM

## 2023-07-15 DIAGNOSIS — Z79899 Other long term (current) drug therapy: Secondary | ICD-10-CM

## 2023-07-16 NOTE — Telephone Encounter (Signed)
 Last Fill: 06/15/2023   UDS:03/14/2023 Uds consistent with treatment    Narc Agreement: 03/14/2023   Next Visit: 08/14/2023   Last Visit: 03/12/2023   Dx: Rheumatoid arthritis of multiple sites with negative rheumatoid factor     Current Dose per office note on 03/12/2023:continues to take tramadol  as needed for pain relief.      Okay to refill Tramadol ?

## 2023-07-17 ENCOUNTER — Other Ambulatory Visit: Payer: Self-pay

## 2023-07-17 ENCOUNTER — Other Ambulatory Visit (HOSPITAL_COMMUNITY): Payer: Self-pay

## 2023-07-17 NOTE — Progress Notes (Signed)
 Specialty Pharmacy Refill Coordination Note  Spoke with Mialynn S Cortez  Orit DESERIE DIRKS is a 78 y.o. female contacted today regarding refills of specialty medication(s) Abatacept  (Orencia  ClickJect)  Injection date: 07/25/23   Patient requested: Delivery   Delivery date: 07/19/23   Verified address: [address]  [place] [address]  Medication will be filled on 07/18/23.

## 2023-07-18 ENCOUNTER — Other Ambulatory Visit: Payer: Self-pay

## 2023-07-31 NOTE — Progress Notes (Unsigned)
 Office Visit Note  Patient: [name]             Date of Birth: 1945-06-08           MRN: [id number]             PCP: Deena Rocky Browning, MD Referring: Deena Rocky Browning, MD Visit Date: 08/14/2023 Occupation: @GUAROCC @  Subjective:  Medication monitoring   History of Present Illness: [name] is a 78 y.o. female with history of seronegative rheumatoid arthritis.  Patient remains on orencia  125 mg sq injections once weekly.  She is tolerating Orencia  without any side effects and has not had any recent gaps in therapy.  She denies any recent or recurrent infections.  Patient states about a month ago she noticed a knot appeared on her right wrist which was sore and bothersome initially but since has subsided in size and no longer painful.  She she continues to experience intermittent discomfort in her right shoulder and both wrist joints but denies any joint swelling or signs of active disease.  She has been taking tramadol  as needed for symptomatic relief.  She continues to use a rollator walker to assist with ambulation.  She denies any new medical conditions.  Activities of Daily Living:  Patient reports morning stiffness for 10 minutes.   Patient Reports nocturnal pain.  Difficulty dressing/grooming: Denies Difficulty climbing stairs: Reports Difficulty getting out of chair: Reports Difficulty using hands for taps, buttons, cutlery, and/or writing: Reports  Review of Systems  Constitutional:  Negative for fatigue.  HENT:  Positive for mouth dryness. Negative for mouth sores.   Eyes:  Negative for dryness.  Respiratory:  Positive for shortness of breath.   Cardiovascular:  Positive for palpitations. Negative for chest pain.  Gastrointestinal:  Negative for blood in stool, constipation and diarrhea.  Endocrine: Positive for increased urination.  Genitourinary:  Positive for involuntary urination.  Musculoskeletal:  Positive for joint pain, gait problem, joint  pain, joint swelling, myalgias, morning stiffness, muscle tenderness and myalgias. Negative for muscle weakness.  Skin:  Negative for color change, rash, hair loss and sensitivity to sunlight.  Allergic/Immunologic: Negative for susceptible to infections.  Neurological:  Positive for dizziness. Negative for headaches.  Hematological:  Negative for swollen glands.  Psychiatric/Behavioral:  Negative for depressed mood and sleep disturbance. The patient is not nervous/anxious.     PMFS History:  Patient Active Problem List   Diagnosis Date Noted   Unilateral primary osteoarthritis, right knee 11/18/2018   Closed fracture of shaft of metatarsal bone 07/30/2016   Metatarsal fracture 07/30/2016   Displaced fracture of second metatarsal bone, right foot, initial encounter for closed fracture 07/26/2016   Arthritis of shoulder 04/10/2016   High risk medication use 03/16/2016   Primary osteoarthritis of both feet 03/16/2016   Primary osteoarthritis of both knees 03/16/2016   DDD (degenerative disc disease), lumbar 03/16/2016   OSA on CPAP 07/27/2015   Lip numbness 03/19/2015   TIA (transient ischemic attack) 03/19/2015   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HCC) 02/06/2015   Post op infection 02/05/2015   History of lumbar laminectomy for spinal cord decompression 01/17/2015   Lumbar spinal stenosis 01/17/2015   Primary osteoarthritis of left knee 03/25/2014   Rheumatoid arthritis of multiple sites with negative rheumatoid factor (HCC) 03/25/2014   History of total knee replacement, left 03/25/2014   DERMATITIS 09/28/2008   Constipation 05/25/2008   DYSPNEA ON EXERTION 05/25/2008   ANEMIA, NORMOCYTIC 04/13/2008   ANKLE PAIN, BILATERAL  11/25/2007   HLD (hyperlipidemia) 08/14/2006   Morbid (severe) obesity due to excess calories (HCC) 12/13/2005   CARPAL TUNNEL SYNDROME 12/13/2005   Hereditary and idiopathic peripheral neuropathy 12/13/2005   Essential hypertension 12/13/2005   Cardiac dysrhythmia  12/13/2005    Past Medical History:  Diagnosis Date   Anemia    Arthritis    Rheumatoid, followed by Dr. Dolphus, knees & ankles    Cancer (HCC)    CHF (congestive heart failure) (HCC)    pt not aware of this   Constipation    GERD (gastroesophageal reflux disease)    H/O cardiovascular stress test 02/2014   seen by Dr. Ladona - told that she was cleared for surgery   Heart murmur    states she's never had any problems   Hypertension    Leg cramps    Shortness of breath dyspnea    with exertion   Sleep apnea    CPAP in use- QO night, study done at [facility]- 2007    Stroke [facility]) 03/2015   TIA    Family History  Problem Relation Age of Onset   Seizures Mother    Transient ischemic attack Mother    Hypertension Mother    Diabetes Mother    Congestive Heart Failure Mother    Arthritis/Rheumatoid Mother    Parkinson's disease Mother    Dementia Mother    Heart disease Father        CHF   Stroke Sister    Depression Sister    Arthritis Brother    Arthritis Brother    Arthritis Son    Hypertension Son    Past Surgical History:  Procedure Laterality Date   ABDOMINAL HYSTERECTOMY     CARPAL TUNNEL RELEASE Right    COLONOSCOPY     COLONOSCOPY WITH PROPOFOL  N/A 11/13/2019   Procedure: COLONOSCOPY WITH PROPOFOL ;  Surgeon: Rollin Dover, MD;  Location: [facility];  Service: Endoscopy;  Laterality: N/A;   ESOPHAGOGASTRODUODENOSCOPY (EGD) WITH PROPOFOL  N/A 11/13/2019   Procedure: ESOPHAGOGASTRODUODENOSCOPY (EGD) WITH PROPOFOL ;  Surgeon: Rollin Dover, MD;  Location: [facility];  Service: Endoscopy;  Laterality: N/A;   FOOT SURGERY Bilateral    bunionectomy, calluses , also has several pins & screws   HAND SURGERY     JOINT REPLACEMENT     planned for 03/25/2014   KNEE ARTHROPLASTY     KNEE SURGERY  1990's    LUMBAR LAMINECTOMY/DECOMPRESSION MICRODISCECTOMY N/A 01/17/2015   Procedure: L2-3, L3-4 Decompression;  Surgeon: Oneil JAYSON Herald, MD;  Location: [facility];  Service:  Orthopedics;  Laterality: N/A;   LUMBAR LAMINECTOMY/DECOMPRESSION MICRODISCECTOMY N/A 02/06/2015   Procedure: I AND D LUMBAR WITH WOUND VAC PLACEMENT;  Surgeon: Oneil JAYSON Herald, MD;  Location: [facility] OR;  Service: Orthopedics;  Laterality: N/A;   MASTECTOMY Right 02/01/2020   ORIF TOE FRACTURE Right 07/30/2016   Procedure: OPEN REDUCTION INTERNAL FIXATION (ORIF) RIGHT 2ND METATARSAL (TOE) FRACTURE;  Surgeon: Herald Oneil JAYSON, MD;  Location: [facility] OR;  Service: Orthopedics;  Laterality: Right;   POLYPECTOMY  11/13/2019   Procedure: POLYPECTOMY;  Surgeon: Rollin Dover, MD;  Location: [facility];  Service: Endoscopy;;   REPLACEMENT TOTAL KNEE Right 08/02/2022   TOTAL KNEE ARTHROPLASTY Left 03/25/2014   Procedure: TOTAL KNEE ARTHROPLASTY;  Surgeon: Maude LELON Right, MD;  Location: [facility];  Service: Orthopedics;  Laterality: Left;   TUBAL LIGATION     Social History   Social History Narrative   Lives with husband.  2 Sons.  Using walker.  Caffeine  Less then 1 cups daily.   Retired.     Immunization History  Administered Date(s) Administered   H1N1 12/23/2007   Influenza Split 11/03/2014   Influenza Whole 12/19/2005, 11/13/2006   Influenza,inj,Quad PF,6+ Mos 09/16/2017   Influenza-Unspecified 11/03/2014   PFIZER Comirnaty(Gray Top)Covid-19 Tri-Sucrose Vaccine 10/07/2019   PFIZER(Purple Top)SARS-COV-2 Vaccination 01/28/2019, 02/18/2019   Pneumococcal Polysaccharide-23 12/19/2005   Tdap 12/20/2011     Objective: Vital Signs: BP (!) 144/74 (BP Location: Left Arm, Patient Position: Sitting, Cuff Size: Normal)   Pulse 72   Resp 14   Ht 4' 11 (1.499 m)   Wt 201 lb (91.2 kg)   BMI 40.60 kg/m    Physical Exam Vitals and nursing note reviewed.  Constitutional:      Appearance: She is well-developed.  HENT:     Head: Normocephalic and atraumatic.  Eyes:     Conjunctiva/sclera: Conjunctivae normal.  Cardiovascular:     Rate and Rhythm: Normal rate and regular rhythm.     Heart sounds:  Normal heart sounds.  Pulmonary:     Effort: Pulmonary effort is normal.     Breath sounds: Normal breath sounds.  Abdominal:     General: Bowel sounds are normal.     Palpations: Abdomen is soft.  Musculoskeletal:     Cervical back: Normal range of motion.  Lymphadenopathy:     Cervical: No cervical adenopathy.  Skin:    General: Skin is warm and dry.     Capillary Refill: Capillary refill takes less than 2 seconds.  Neurological:     Mental Status: She is alert and oriented to person, place, and time.  Psychiatric:        Behavior: Behavior normal.      Musculoskeletal Exam: Patient remained seated during the examination today.  C-spine has limited ROM.  Postural thoracic kyphosis.  Shoulder abduction limited to about 90 degrees bilaterally.  Elbow joints have good range of motion with no tenderness along the joint line.  Synovial thickening of both wrist joints with a possible ganglion cyst vs. Rheumatoid nodule on radial aspect of right wrist.  Synovial thickening of MCP joints with no synovitis noted.  PIP and DIP thickening noted.  Hip joints difficult to assess in seated position.  Bilateral knee replacements have good range of motion with no effusion.  Synovial thickening of both ankle joints with subluxation.  CDAI Exam: CDAI Score: -- Patient Global: --; Provider Global: -- Swollen: --; Tender: -- Joint Exam 08/14/2023   No joint exam has been documented for this visit   There is currently no information documented on the homunculus. Go to the Rheumatology activity and complete the homunculus joint exam.  Investigation: No additional findings.  Imaging: No results found.  Recent Labs: Lab Results  Component Value Date   WBC 5.4 03/12/2023   HGB 12.0 03/12/2023   PLT 276 03/12/2023   NA 142 03/12/2023   K 4.7 03/12/2023   CL 104 03/12/2023   CO2 31 03/12/2023   GLUCOSE 73 03/12/2023   BUN 17 03/12/2023   CREATININE 1.06 (H) 03/12/2023   BILITOT 0.4  03/12/2023   ALKPHOS 138 (H) 12/13/2022   AST 19 03/12/2023   ALT 12 03/12/2023   PROT 7.2 03/12/2023   ALBUMIN  4.2 12/13/2022   CALCIUM  10.0 03/12/2023   GFRAA 62 11/16/2019   QFTBGOLDPLUS NEGATIVE 03/12/2023    Speciality Comments: Narcotic Agreement 10/30/16 Humira  stopped 01/18/20 d/t chemo/radiation Orencia  started 09/15/20  Procedures:  No procedures performed  Allergies: Ramipril     Assessment / Plan:     Visit Diagnoses: Rheumatoid arthritis of multiple sites with negative rheumatoid factor (HCC): She has no synovitis on examination today.  She has not have any signs or symptoms of a rheumatoid arthritis flare.  She has clinically been doing well on Orencia  125 mg subcutaneous injections once every 7 days.  She is tolerating Orencia  without any side effects and has not had any recent gaps in therapy.  No recent or recurrent infections.  She experiences intermittent discomfort in her right shoulder and both wrist but has not noticed any active inflammation.  Patient will remain on Orencia  as monotherapy.  She will continue to take tramadol  as needed for symptomatic relief.  Patient plans to have an updated UDS performed in September 2025--future order will be placed.  She was advised to notify us  if she develops any new or worsening symptoms.  She will follow-up in the office in 5 months or sooner if needed.  High risk medication use - Orencia  125 mg sq injections every 7 days.  Patient initiated Orencia  on 09/15/2020.  Previously held Humira  prior to starting Chemotherapy and radiation therapy. CMP and anemia panel obtained on 06/20/23. Upcoming lab work in September 2025.  CBC with diff updated today.  TB gold negative on 03/12/23.  Lipid panel updated today  No recent or recurrent infections. Discussed the importance of holding orencia  if she develops signs or symptoms of an infection and to resume once the infection has completely cleared.   - Plan: CBC with Differential/Platelet, Lipid  panel  History of total knee replacement, bilateral - Left total knee replacement--03/25/2014. Right total knee replacement 08/05/2022.  Doing well.  No effusion noted.  Using rollator walker to assist with ambulation.    Primary osteoarthritis of both feet: She has thickening of both ankles with no tenderness or joint swelling.   Degeneration of intervertebral disc of lumbar region without discogenic back pain or lower extremity pain - Lumbar laminectomy and decompression microdiscectomy x 2.  Using a rollator walker to assist with ambulation.  She takes tramadol  as needed for pain relief.   Other medical conditions are listed as follows:  History of hypertension: Blood pressure was slightly elevated today in the office and was rechecked prior to leaving--improved. Patient was advised to monitor blood pressure closely.   History of hyperlipidemia - Lipid panel updated today. Plan: Lipid panel  History of TIA (transient ischemic attack)  Invasive ductal carcinoma of right breast (HCC)  History of sleep apnea  Orders: Orders Placed This Encounter  Procedures   CBC with Differential/Platelet   Lipid panel   No orders of the defined types were placed in this encounter.   Follow-Up Instructions: Return in about 5 months (around 01/14/2024) for Rheumatoid arthritis.   Waddell CHRISTELLA Craze, PA-C  Note - This record has been created using Dragon software.  Chart creation errors have been sought, but may not always  have been located. Such creation errors do not reflect on  the standard of medical care.

## 2023-08-08 ENCOUNTER — Other Ambulatory Visit (HOSPITAL_COMMUNITY): Payer: Self-pay

## 2023-08-14 ENCOUNTER — Other Ambulatory Visit: Payer: Self-pay

## 2023-08-14 ENCOUNTER — Other Ambulatory Visit: Payer: Self-pay | Admitting: Rheumatology

## 2023-08-14 ENCOUNTER — Other Ambulatory Visit: Payer: Self-pay | Admitting: *Deleted

## 2023-08-14 ENCOUNTER — Ambulatory Visit: Admitting: Rheumatology

## 2023-08-14 ENCOUNTER — Ambulatory Visit: Attending: Physician Assistant | Admitting: Physician Assistant

## 2023-08-14 ENCOUNTER — Encounter: Payer: Self-pay | Admitting: Physician Assistant

## 2023-08-14 VITALS — BP 137/84 | HR 69 | Resp 14 | Ht 59.0 in | Wt 201.0 lb

## 2023-08-14 DIAGNOSIS — Z8679 Personal history of other diseases of the circulatory system: Secondary | ICD-10-CM

## 2023-08-14 DIAGNOSIS — Z79891 Long term (current) use of opiate analgesic: Secondary | ICD-10-CM

## 2023-08-14 DIAGNOSIS — Z5181 Encounter for therapeutic drug level monitoring: Secondary | ICD-10-CM

## 2023-08-14 DIAGNOSIS — C50911 Malignant neoplasm of unspecified site of right female breast: Secondary | ICD-10-CM

## 2023-08-14 DIAGNOSIS — Z96653 Presence of artificial knee joint, bilateral: Secondary | ICD-10-CM | POA: Diagnosis not present

## 2023-08-14 DIAGNOSIS — M0609 Rheumatoid arthritis without rheumatoid factor, multiple sites: Secondary | ICD-10-CM

## 2023-08-14 DIAGNOSIS — Z79899 Other long term (current) drug therapy: Secondary | ICD-10-CM

## 2023-08-14 DIAGNOSIS — M65331 Trigger finger, right middle finger: Secondary | ICD-10-CM

## 2023-08-14 DIAGNOSIS — M19072 Primary osteoarthritis, left ankle and foot: Secondary | ICD-10-CM

## 2023-08-14 DIAGNOSIS — M51369 Other intervertebral disc degeneration, lumbar region without mention of lumbar back pain or lower extremity pain: Secondary | ICD-10-CM

## 2023-08-14 DIAGNOSIS — Z8673 Personal history of transient ischemic attack (TIA), and cerebral infarction without residual deficits: Secondary | ICD-10-CM

## 2023-08-14 DIAGNOSIS — M19071 Primary osteoarthritis, right ankle and foot: Secondary | ICD-10-CM

## 2023-08-14 DIAGNOSIS — Z8669 Personal history of other diseases of the nervous system and sense organs: Secondary | ICD-10-CM

## 2023-08-14 DIAGNOSIS — Z8639 Personal history of other endocrine, nutritional and metabolic disease: Secondary | ICD-10-CM

## 2023-08-14 LAB — CBC WITH DIFFERENTIAL/PLATELET
Absolute Lymphocytes: 1328 {cells}/uL (ref 850–3900)
Absolute Monocytes: 372 {cells}/uL (ref 200–950)
Basophils Absolute: 41 {cells}/uL (ref 0–200)
Basophils Relative: 0.7 %
Eosinophils Absolute: 142 {cells}/uL (ref 15–500)
Eosinophils Relative: 2.4 %
HCT: 36.9 % (ref 35.0–45.0)
Hemoglobin: 11.6 g/dL — ABNORMAL LOW (ref 11.7–15.5)
MCH: 28.8 pg (ref 27.0–33.0)
MCHC: 31.4 g/dL — ABNORMAL LOW (ref 32.0–36.0)
MCV: 91.6 fL (ref 80.0–100.0)
MPV: 10.5 fL (ref 7.5–12.5)
Monocytes Relative: 6.3 %
Neutro Abs: 4018 {cells}/uL (ref 1500–7800)
Neutrophils Relative %: 68.1 %
Platelets: 244 Thousand/uL (ref 140–400)
RBC: 4.03 Million/uL (ref 3.80–5.10)
RDW: 13.4 % (ref 11.0–15.0)
Total Lymphocyte: 22.5 %
WBC: 5.9 Thousand/uL (ref 3.8–10.8)

## 2023-08-14 LAB — LIPID PANEL
Cholesterol: 188 mg/dL (ref <200)
HDL: 104 mg/dL (ref >=50)
LDL Cholesterol (Calc): 70 mg/dL
Non-HDL Cholesterol (Calc): 84 mg/dL (ref <130)
Total CHOL/HDL Ratio: 1.8 (calc) (ref <5.0)
Triglycerides: 48 mg/dL (ref <150)

## 2023-08-14 NOTE — Progress Notes (Signed)
 Specialty Pharmacy Refill Coordination Note  [name] is a 78 y.o. female contacted today regarding refills of specialty medication(s) Abatacept  (Orencia  ClickJect)   Patient requested Delivery   Delivery date: 08/16/23   Verified address: [address]  [place] [address]   Medication will be filled on 08/15/23.

## 2023-08-14 NOTE — Telephone Encounter (Signed)
 Last Fill: 07/16/2023  UDS:03/14/2023  Narc Agreement: 03/14/2023  Next Visit: 01/30/2024  Last Visit: 08/14/2023  Dx: Rheumatoid arthritis of multiple sites with negative rheumatoid factor   Current Dose per office note on 08/13/2023:tramadol  as needed for symptomatic relief.    Okay to refill Tramadol ?

## 2023-08-15 ENCOUNTER — Ambulatory Visit: Payer: Self-pay | Admitting: Physician Assistant

## 2023-08-15 NOTE — Progress Notes (Signed)
 Hemoglobin is borderline low-11.6. any source of bleeding? Blood in stool? Recent procedures? MCHC is borderline low-can be a sign of iron deficiency. Rest of CBC WNL. Recommend multivitamin with iron Lipid panel WNL

## 2023-08-16 ENCOUNTER — Telehealth: Payer: Self-pay

## 2023-08-16 ENCOUNTER — Other Ambulatory Visit: Payer: Self-pay

## 2023-08-16 DIAGNOSIS — Z79891 Long term (current) use of opiate analgesic: Secondary | ICD-10-CM

## 2023-08-16 DIAGNOSIS — Z79899 Other long term (current) drug therapy: Secondary | ICD-10-CM

## 2023-08-16 DIAGNOSIS — Z5181 Encounter for therapeutic drug level monitoring: Secondary | ICD-10-CM

## 2023-08-16 NOTE — Telephone Encounter (Signed)
 Patient requested to have this performed with upcoming labs in September-please send orders to wherever she goes for labs ordered by nephrology.

## 2023-08-16 NOTE — Telephone Encounter (Signed)
 Received a call from [facility] stating they had gotten lab orders for the patient's urine drug screen to be drawn. They advised they do not draw labs in the office. Please advise.

## 2023-08-16 NOTE — Telephone Encounter (Signed)
 Contacted the patient, patient states she plans to get labs drawn for nephrology around mid September at [facility]. Advised the patient we would put the future orders in and advised her to call us  at least 24 hours before going to [facility] so that we can release the orders. Patient verbalized understanding.

## 2023-08-16 NOTE — Addendum Note (Signed)
 Addended by: Lillianah Swartzentruber P on: 08/16/2023 09:49 AM   Modules accepted: Orders

## 2023-08-16 NOTE — Addendum Note (Signed)
 Addended by: Fonda Rochon P on: 08/16/2023 09:50 AM   Modules accepted: Orders

## 2023-09-05 ENCOUNTER — Other Ambulatory Visit: Payer: Self-pay

## 2023-09-05 ENCOUNTER — Other Ambulatory Visit: Payer: Self-pay | Admitting: Physician Assistant

## 2023-09-05 DIAGNOSIS — M0609 Rheumatoid arthritis without rheumatoid factor, multiple sites: Secondary | ICD-10-CM

## 2023-09-05 MED ORDER — ORENCIA CLICKJECT 125 MG/ML ~~LOC~~ SOAJ
125.0000 mg | SUBCUTANEOUS | 0 refills | Status: DC
  Filled 2023-09-05: qty 12, 84d supply, fill #0
  Filled 2023-09-10: qty 4, 28d supply, fill #0
  Filled 2023-10-10: qty 4, 28d supply, fill #1
  Filled 2023-11-06 – 2023-11-08 (×2): qty 4, 28d supply, fill #2

## 2023-09-05 NOTE — Telephone Encounter (Signed)
 Last Fill: 06/18/2023  Labs: 08/14/2023 Hemoglobin is borderline low-11.6. any source of bleeding? Blood in stool? Recent procedures? MCHC is borderline low-can be a sign of iron deficiency. Rest of CBC WNL. Recommend multivitamin with iron Lipid panel WNL  TB Gold: 03/12/2023 TB Gold Negative    Next Visit: 01/30/2024  Last Visit: 08/14/2023  DX: Rheumatoid arthritis of multiple sites with negative rheumatoid factor   Current Dose per office note 08/14/2023: Orencia  125 mg sq injections every 7 days   Okay to refill Orencia ?

## 2023-09-09 ENCOUNTER — Other Ambulatory Visit: Payer: Self-pay | Admitting: Rheumatology

## 2023-09-09 DIAGNOSIS — Z5181 Encounter for therapeutic drug level monitoring: Secondary | ICD-10-CM

## 2023-09-09 DIAGNOSIS — Z79899 Other long term (current) drug therapy: Secondary | ICD-10-CM

## 2023-09-10 ENCOUNTER — Other Ambulatory Visit: Payer: Self-pay

## 2023-09-10 ENCOUNTER — Other Ambulatory Visit (HOSPITAL_COMMUNITY): Payer: Self-pay

## 2023-09-10 NOTE — Progress Notes (Signed)
 Specialty Pharmacy Refill Coordination Note  Spoke with [name]  [name] is a 78 y.o. female contacted today regarding refills of specialty medication(s) Abatacept  (Orencia  ClickJect)  Doses on hand: 1 for 09/12/23  Injection date: 09/19/23   Patient requested: Delivery   Delivery date: 09/12/23   Verified address: [address] [place] [address]  Medication will be filled on 09/11/23.

## 2023-09-10 NOTE — Telephone Encounter (Signed)
 Last Fill: 08/14/2023   UDS:03/14/2023   Narc Agreement: 03/14/2023   Next Visit: 01/30/2024   Last Visit: 08/14/2023   Dx: Rheumatoid arthritis of multiple sites with negative rheumatoid factor    Current Dose per office note on 08/13/2023:tramadol  as needed for symptomatic relief.      Okay to refill Tramadol ?

## 2023-09-10 NOTE — Telephone Encounter (Signed)
 Due for updated UDS

## 2023-09-10 NOTE — Telephone Encounter (Signed)
 Patient advised she is due to update her UDS. Patient plans to update on 09/24/2023

## 2023-09-11 ENCOUNTER — Other Ambulatory Visit: Payer: Self-pay

## 2023-09-23 ENCOUNTER — Other Ambulatory Visit: Payer: Self-pay

## 2023-09-23 DIAGNOSIS — Z79899 Other long term (current) drug therapy: Secondary | ICD-10-CM

## 2023-09-23 DIAGNOSIS — Z79891 Long term (current) use of opiate analgesic: Secondary | ICD-10-CM

## 2023-09-23 DIAGNOSIS — Z5181 Encounter for therapeutic drug level monitoring: Secondary | ICD-10-CM

## 2023-09-26 LAB — LAB REPORT - SCANNED: EGFR: 55

## 2023-09-27 LAB — TRAMADOL, URINE

## 2023-09-27 LAB — DRUG SCREEN (9), URINE
Amphetamines, Urine: NEGATIVE ng/mL
Barbiturate screen, urine: NEGATIVE ng/mL
Benzodiazepine Quant, Ur: NEGATIVE ng/mL
Cannabinoid Quant, Ur: NEGATIVE ng/mL
Cocaine (Metab.): NEGATIVE ng/mL
Methadone: NEGATIVE ng/mL
OPIATE SCREEN URINE: NEGATIVE ng/mL
PCP Quant, Ur: NEGATIVE ng/mL
Propoxyphene: NEGATIVE ng/mL

## 2023-09-29 LAB — TRAMADOL (GC/MS), URINE
Tramadol (GC/MS): 6366 ng/mL
Tramadol: POSITIVE — AB

## 2023-09-29 LAB — TRAMADOL, URINE

## 2023-09-30 ENCOUNTER — Telehealth: Payer: Self-pay

## 2023-09-30 ENCOUNTER — Ambulatory Visit: Payer: Self-pay | Admitting: Physician Assistant

## 2023-09-30 NOTE — Telephone Encounter (Signed)
 Labs received from:[facility]  Drawn on:09/24/2023  Reviewed by:Waddell Craze, PA-C  Labs drawn:Anemia Profile A, CMP, Protein Creatinine Ratio, Vitamin D, Uric Acid, Phosphorus, Magnesium, PTH Intact  Results:Creatinine 1.05 eGFR 55 Protein/Creat Ratio 235 Vitamin D 23.7  Per Waddell Craze, Please clarify if someone is treating her Vitamin D deficiency? Avoid NSAID use.   Contacted the patient and advised Please clarify if someone is treating her Vitamin D deficiency? Avoid NSAID use. Patient verbalized understanding. Patient states she does not take NSAIDs and she is not prescribed anything for Vitamin D. Patient states she only takes a multi vitamin that has Vitamin D3 1000 IU.

## 2023-09-30 NOTE — Progress Notes (Signed)
 UDS consistent with treatment

## 2023-10-10 ENCOUNTER — Other Ambulatory Visit: Payer: Self-pay

## 2023-10-10 NOTE — Progress Notes (Signed)
 Specialty Pharmacy Refill Coordination Note  [name] is a 78 y.o. female contacted today regarding refills of specialty medication(s) Abatacept  (Orencia  ClickJect)   Patient requested Delivery   Delivery date: 10/15/23   Verified address: [address] [place] [address]   Medication will be filled on 10.06.25.

## 2023-10-11 ENCOUNTER — Other Ambulatory Visit: Payer: Self-pay

## 2023-10-14 ENCOUNTER — Encounter: Payer: Self-pay | Admitting: Internal Medicine

## 2023-10-22 ENCOUNTER — Other Ambulatory Visit: Payer: Self-pay | Admitting: Physician Assistant

## 2023-10-22 DIAGNOSIS — Z79899 Other long term (current) drug therapy: Secondary | ICD-10-CM

## 2023-10-22 DIAGNOSIS — Z5181 Encounter for therapeutic drug level monitoring: Secondary | ICD-10-CM

## 2023-10-22 NOTE — Telephone Encounter (Signed)
 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      Okay to refill Tramadol ?

## 2023-11-04 ENCOUNTER — Other Ambulatory Visit: Payer: Self-pay

## 2023-11-06 ENCOUNTER — Other Ambulatory Visit (HOSPITAL_COMMUNITY): Payer: Self-pay

## 2023-11-08 ENCOUNTER — Other Ambulatory Visit: Payer: Self-pay

## 2023-11-08 ENCOUNTER — Other Ambulatory Visit (HOSPITAL_COMMUNITY): Payer: Self-pay

## 2023-11-08 NOTE — Progress Notes (Signed)
 Specialty Pharmacy Refill Coordination Note  [name] is a 78 y.o. female contacted today regarding refills of specialty medication(s) Abatacept  (Orencia  ClickJect)   Patient requested Delivery   Delivery date: 11/12/23   Verified address: [address] [place] [address]   Medication will be filled on: 11/11/23

## 2023-11-11 ENCOUNTER — Other Ambulatory Visit: Payer: Self-pay

## 2023-11-19 ENCOUNTER — Other Ambulatory Visit: Payer: Self-pay | Admitting: Physician Assistant

## 2023-11-19 DIAGNOSIS — Z79899 Other long term (current) drug therapy: Secondary | ICD-10-CM

## 2023-11-19 DIAGNOSIS — Z5181 Encounter for therapeutic drug level monitoring: Secondary | ICD-10-CM

## 2023-11-19 NOTE — Telephone Encounter (Signed)
 Last Fill: 10/22/2023   UDS: 09/24/2023 UDS consistent with treatment    Narc Agreement: 03/14/2023   Next Visit: 01/30/2024   Last Visit: 08/14/2023   Dx: Rheumatoid arthritis of multiple sites with negative rheumatoid factor    Current Dose per office note on 08/13/2023:tramadol  as needed for symptomatic relief.      Okay to refill Tramadol ?

## 2023-12-02 ENCOUNTER — Other Ambulatory Visit: Payer: Self-pay

## 2023-12-02 ENCOUNTER — Other Ambulatory Visit: Payer: Self-pay | Admitting: Physician Assistant

## 2023-12-02 DIAGNOSIS — M0609 Rheumatoid arthritis without rheumatoid factor, multiple sites: Secondary | ICD-10-CM

## 2023-12-02 MED ORDER — ORENCIA CLICKJECT 125 MG/ML ~~LOC~~ SOAJ
125.0000 mg | SUBCUTANEOUS | 0 refills | Status: AC
  Filled 2023-12-02: qty 12, 84d supply, fill #0
  Filled 2023-12-04: qty 4, 28d supply, fill #0
  Filled 2023-12-30: qty 4, 28d supply, fill #1
  Filled 2024-01-27: qty 4, 28d supply, fill #2

## 2023-12-02 NOTE — Telephone Encounter (Signed)
 Last Fill: 09/05/2023  Labs: 09/24/2023 Creatinine 1.05 eGFR 55 Protein/Creat Ratio 235 Vitamin D 23.7  TB Gold: 03/12/2023 Neg    Next Visit: 01/30/2024  Last Visit: 08/14/2023  DX: Rheumatoid arthritis of multiple sites with negative rheumatoid factor   Current Dose per office note 08/14/2023: Orencia  125 mg sq injections every 7 days.   Okay to refill Orencia ?

## 2023-12-04 ENCOUNTER — Other Ambulatory Visit (HOSPITAL_COMMUNITY): Payer: Self-pay

## 2023-12-04 ENCOUNTER — Other Ambulatory Visit: Payer: Self-pay

## 2023-12-04 NOTE — Progress Notes (Signed)
 Specialty Pharmacy Refill Coordination Note  [name] is a 78 y.o. female contacted today regarding refills of specialty medication(s) Abatacept  (Orencia  ClickJect)   Patient requested Delivery   Delivery date: 12/10/23   Verified address: [address] [place] [address]   Medication will be filled on: 12/09/23

## 2023-12-09 ENCOUNTER — Other Ambulatory Visit: Payer: Self-pay

## 2023-12-16 ENCOUNTER — Other Ambulatory Visit: Payer: Self-pay | Admitting: Physician Assistant

## 2023-12-16 DIAGNOSIS — Z5181 Encounter for therapeutic drug level monitoring: Secondary | ICD-10-CM

## 2023-12-16 DIAGNOSIS — Z79899 Other long term (current) drug therapy: Secondary | ICD-10-CM

## 2023-12-16 NOTE — Telephone Encounter (Signed)
 Last Fill: 11/19/2023  UDS:09/24/2023 UDS consistent with treatment   Narc Agreement: 03/12/2023   Next Visit: 01/30/2024  Last Visit: 08/14/2023  Dx: Rheumatoid arthritis of multiple sites with negative rheumatoid factor   Current Dose per office note on 08/14/2023: She will continue to take tramadol  as needed for symptomatic relief.    Okay to refill tramadol ?

## 2023-12-30 ENCOUNTER — Other Ambulatory Visit (HOSPITAL_COMMUNITY): Payer: Self-pay

## 2024-01-01 ENCOUNTER — Other Ambulatory Visit: Payer: Self-pay

## 2024-01-01 NOTE — Progress Notes (Signed)
 Specialty Pharmacy Refill Coordination Note  [name] is a 78 y.o. female contacted today regarding refills of specialty medication(s) Abatacept  (Orencia  ClickJect)   Patient requested Delivery   Delivery date: 01/07/24   Verified address: [address] [place] [address]   Medication will be filled on: 01/06/24

## 2024-01-06 ENCOUNTER — Other Ambulatory Visit: Payer: Self-pay

## 2024-01-20 ENCOUNTER — Other Ambulatory Visit: Payer: Self-pay | Admitting: Physician Assistant

## 2024-01-20 DIAGNOSIS — Z79899 Other long term (current) drug therapy: Secondary | ICD-10-CM

## 2024-01-20 DIAGNOSIS — Z5181 Encounter for therapeutic drug level monitoring: Secondary | ICD-10-CM

## 2024-01-20 NOTE — Telephone Encounter (Signed)
 Last Fill: 12/16/2023   UDS: 09/24/2023 UDS consistent with treatment    Narc Agreement: 03/12/2023    Next Visit: 01/30/2024   Last Visit: 08/14/2023   Dx: Rheumatoid arthritis of multiple sites with negative rheumatoid factor    Current Dose per office note on 08/14/2023: She will continue to take tramadol  as needed for symptomatic relief.      Okay to refill tramadol ?

## 2024-01-21 NOTE — Progress Notes (Signed)
 "  Office Visit Note  Patient: [name]             Date of Birth: [birth year]           MRN: [id number]             PCP: Deena Rocky Browning, MD Referring: Deena Rocky Browning, MD Visit Date: 01/30/2024 Occupation: Data Unavailable  Subjective:  Increased joint pain  History of Present Illness: [name] is a 79 y.o. female with seronegative rheumatoid arthritis.  She returns today after her last visit in August 2025.  She states recently with the weather change she has been experiencing increased discomfort in her wrist joints.  She has not noticed any joint swelling.  She has been taking Orencia  125 mg subcu weekly.  He denies any interruption in the treatment.  She takes tramadol  as needed for the pain management    Activities of Daily Living:  Patient reports morning stiffness for 10 minutes.   Patient Reports nocturnal pain.  Difficulty dressing/grooming: Denies Difficulty climbing stairs: Reports Difficulty getting out of chair: Reports Difficulty using hands for taps, buttons, cutlery, and/or writing: Reports  Review of Systems  Constitutional:  Negative for fatigue.  HENT:  Positive for mouth dryness. Negative for mouth sores.   Eyes:  Negative for dryness.  Respiratory:  Positive for shortness of breath.   Cardiovascular:  Positive for palpitations. Negative for chest pain.  Gastrointestinal:  Negative for blood in stool, constipation and diarrhea.  Endocrine: Negative for increased urination.  Genitourinary:  Positive for involuntary urination.  Musculoskeletal:  Positive for joint pain, gait problem, joint pain, joint swelling, myalgias, muscle weakness, morning stiffness, muscle tenderness and myalgias.  Skin:  Negative for color change, rash, hair loss and sensitivity to sunlight.  Allergic/Immunologic: Negative for susceptible to infections.  Neurological:  Positive for dizziness and headaches.  Hematological:  Negative for swollen glands.   Psychiatric/Behavioral:  Negative for depressed mood and sleep disturbance. The patient is not nervous/anxious.     PMFS History:  Patient Active Problem List   Diagnosis Date Noted   Unilateral primary osteoarthritis, right knee 11/18/2018   Closed fracture of shaft of metatarsal bone 07/30/2016   Metatarsal fracture 07/30/2016   Displaced fracture of second metatarsal bone, right foot, initial encounter for closed fracture 07/26/2016   Arthritis of shoulder 04/10/2016   High risk medication use 03/16/2016   Primary osteoarthritis of both feet 03/16/2016   Primary osteoarthritis of both knees 03/16/2016   DDD (degenerative disc disease), lumbar 03/16/2016   OSA on CPAP 07/27/2015   Lip numbness 03/19/2015   TIA (transient ischemic attack) 03/19/2015   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HCC) 02/06/2015   Post op infection 02/05/2015   History of lumbar laminectomy for spinal cord decompression 01/17/2015   Lumbar spinal stenosis 01/17/2015   Primary osteoarthritis of left knee 03/25/2014   Rheumatoid arthritis of multiple sites with negative rheumatoid factor (HCC) 03/25/2014   History of total knee replacement, left 03/25/2014   DERMATITIS 09/28/2008   Constipation 05/25/2008   DYSPNEA ON EXERTION 05/25/2008   ANEMIA, NORMOCYTIC 04/13/2008   ANKLE PAIN, BILATERAL 11/25/2007   HLD (hyperlipidemia) 08/14/2006   Morbid (severe) obesity due to excess calories (HCC) 12/13/2005   CARPAL TUNNEL SYNDROME 12/13/2005   Hereditary and idiopathic peripheral neuropathy 12/13/2005   Essential hypertension 12/13/2005   Cardiac dysrhythmia 12/13/2005    Past Medical History:  Diagnosis Date   Anemia    Arthritis    Rheumatoid,  followed by Dr. Dolphus, knees & ankles    Cancer [facility])    CHF (congestive heart failure) (HCC)    pt not aware of this   Constipation    GERD (gastroesophageal reflux disease)    H/O cardiovascular stress test 02/2014   seen by Dr. Ladona - told that she was cleared  for surgery   Heart murmur    states she's never had any problems   Hypertension    Leg cramps    Shortness of breath dyspnea    with exertion   Sleep apnea    CPAP in use- QO night, study done at [facility]- 2007    Stroke [facility]) 03/2015   TIA    Family History  Problem Relation Age of Onset   Seizures Mother    Transient ischemic attack Mother    Hypertension Mother    Diabetes Mother    Congestive Heart Failure Mother    Arthritis/Rheumatoid Mother    Parkinson's disease Mother    Dementia Mother    Heart disease Father        CHF   Stroke Sister    Depression Sister    Arthritis Brother    Arthritis Brother    Arthritis Son    Hypertension Son    Past Surgical History:  Procedure Laterality Date   ABDOMINAL HYSTERECTOMY     CARPAL TUNNEL RELEASE Right    COLONOSCOPY     COLONOSCOPY WITH PROPOFOL  N/A 11/13/2019   Procedure: COLONOSCOPY WITH PROPOFOL ;  Surgeon: Rollin Dover, MD;  Location: [facility];  Service: Endoscopy;  Laterality: N/A;   ESOPHAGOGASTRODUODENOSCOPY (EGD) WITH PROPOFOL  N/A 11/13/2019   Procedure: ESOPHAGOGASTRODUODENOSCOPY (EGD) WITH PROPOFOL ;  Surgeon: Rollin Dover, MD;  Location: [facility];  Service: Endoscopy;  Laterality: N/A;   FOOT SURGERY Bilateral    bunionectomy, calluses , also has several pins & screws   HAND SURGERY     JOINT REPLACEMENT     planned for 03/25/2014   KNEE ARTHROPLASTY     KNEE SURGERY  1990's    LUMBAR LAMINECTOMY/DECOMPRESSION MICRODISCECTOMY N/A 01/17/2015   Procedure: L2-3, L3-4 Decompression;  Surgeon: Oneil JAYSON Herald, MD;  Location: [facility] OR;  Service: Orthopedics;  Laterality: N/A;   LUMBAR LAMINECTOMY/DECOMPRESSION MICRODISCECTOMY N/A 02/06/2015   Procedure: I AND D LUMBAR WITH WOUND VAC PLACEMENT;  Surgeon: Oneil JAYSON Herald, MD;  Location: [facility] OR;  Service: Orthopedics;  Laterality: N/A;   MASTECTOMY Right 02/01/2020   ORIF TOE FRACTURE Right 07/30/2016   Procedure: OPEN REDUCTION INTERNAL FIXATION (ORIF) RIGHT 2ND  METATARSAL (TOE) FRACTURE;  Surgeon: Herald Oneil JAYSON, MD;  Location: [facility] OR;  Service: Orthopedics;  Laterality: Right;   POLYPECTOMY  11/13/2019   Procedure: POLYPECTOMY;  Surgeon: Rollin Dover, MD;  Location: [facility];  Service: Endoscopy;;   REPLACEMENT TOTAL KNEE Right 08/02/2022   TOTAL KNEE ARTHROPLASTY Left 03/25/2014   Procedure: TOTAL KNEE ARTHROPLASTY;  Surgeon: Maude LELON Right, MD;  Location: [facility] OR;  Service: Orthopedics;  Laterality: Left;   TUBAL LIGATION     Social History[1] Social History   Social History Narrative   Lives with husband.  2 Sons.  Using walker.    Caffeine  Less then 1 cups daily.   Retired.       Immunization History  Administered Date(s) Administered   H1N1 12/23/2007   Influenza Split 11/03/2014   Influenza Whole 12/19/2005, 11/13/2006   Influenza,inj,Quad PF,6+ Mos 09/16/2017   Influenza-Unspecified 11/03/2014   PFIZER Comirnaty(Gray Top)Covid-19 Tri-Sucrose Vaccine 10/07/2019  PFIZER(Purple Top)SARS-COV-2 Vaccination 01/28/2019, 02/18/2019   Pneumococcal Polysaccharide-23 12/19/2005   Tdap 12/20/2011     Objective: Vital Signs: BP 134/83   Pulse 71   Temp 97.7 F (36.5 C)   Resp 14   Ht 4' 11 (1.499 m)   Wt 205 lb 9.6 oz (93.3 kg)   BMI 41.53 kg/m    Physical Exam Vitals and nursing note reviewed.  Constitutional:      Appearance: She is well-developed.  HENT:     Head: Normocephalic and atraumatic.  Eyes:     Conjunctiva/sclera: Conjunctivae normal.  Cardiovascular:     Rate and Rhythm: Normal rate and regular rhythm.     Heart sounds: Normal heart sounds.  Pulmonary:     Effort: Pulmonary effort is normal.     Breath sounds: Normal breath sounds.  Abdominal:     General: Bowel sounds are normal.     Palpations: Abdomen is soft.  Musculoskeletal:     Cervical back: Normal range of motion.  Lymphadenopathy:     Cervical: No cervical adenopathy.  Skin:    General: Skin is warm and dry.     Capillary Refill:  Capillary refill takes less than 2 seconds.  Neurological:     Mental Status: She is alert and oriented to person, place, and time.  Psychiatric:        Behavior: Behavior normal.      Musculoskeletal Exam: Patient were examined in the seated position.  She had limited range of motion of the cervical spine.  Thoracic kyphosis was noted without any tenderness.  Shoulder joint abduction was limited to about 90 degrees bilaterally.  Elbow joints and wrist joints were in good range of motion.  Synovial thickening was noted over bilateral wrist joints.  Synovial thickening was noted over MCP joints.  No synovitis was noted.  DIP and PIP prominence was noted.  Hip joints could not be assessed.  Both knee joints were replaced and fairly good range of motion.  She had no tenderness over her ankles or MTPs.  CDAI Exam: CDAI Score: -- Patient Global: 20 / 100; Provider Global: 20 / 100 Swollen: --; Tender: -- Joint Exam 01/30/2024   No joint exam has been documented for this visit   There is currently no information documented on the homunculus. Go to the Rheumatology activity and complete the homunculus joint exam.  Investigation: No additional findings.  Imaging: No results found.  Recent Labs: Lab Results  Component Value Date   WBC 5.9 08/14/2023   HGB 11.6 (L) 08/14/2023   PLT 244 08/14/2023   NA 142 03/12/2023   K 4.7 03/12/2023   CL 104 03/12/2023   CO2 31 03/12/2023   GLUCOSE 73 03/12/2023   BUN 17 03/12/2023   CREATININE 1.06 (H) 03/12/2023   BILITOT 0.4 03/12/2023   ALKPHOS 138 (H) 12/13/2022   AST 19 03/12/2023   ALT 12 03/12/2023   PROT 7.2 03/12/2023   ALBUMIN  4.2 12/13/2022   CALCIUM  10.0 03/12/2023   GFRAA 62 11/16/2019   QFTBGOLDPLUS NEGATIVE 03/12/2023    Speciality Comments: Narcotic Agreement 10/30/16 Humira  stopped 01/18/20 d/t chemo/radiation Orencia  started 09/15/20  Procedures:  No procedures performed Allergies: Ramipril    Assessment / Plan:      Visit Diagnoses: Rheumatoid arthritis of multiple sites with negative rheumatoid factor (HCC)-patient complains of increased discomfort in her wrist joints with the weather change.  Close synovitis was noted on the examination.  She has been on Orencia  125 mg subcu  weekly without any interruption.  High risk medication use - Orencia  125 mg sq injections every 7 days.  August 14, 2023 CBC was normal.  CMP was normal in March 2025.  She was advised to get labs every 3 months.  Will check labs today.  Information regarding the immunization was placed in the AVS.  She was advised to hold Orencia  if she develops an infection resume after the infection resolves.  Annual skin examination to screen for skin cancer was advised.  Use of sunscreen and sun protection was advised.  History of total knee replacement, bilateral - Left total knee replacement--03/25/2014. Right total knee replacement 08/05/2022.  She had no discomfort range of motion.  Primary osteoarthritis of both feet-she denies any discomfort today.  Degeneration of intervertebral disc of lumbar region without discogenic back pain or lower extremity pain - Lumbar laminectomy and decompression microdiscectomy x 2.  Using a rollator walker to assist with ambulation.  She takes tramadol  as needed for pain relief.  Medication monitoring encounter - UDS: 09/24/2023, narcotic agreement:03/12/2023.  Will recheck UDS in March.  History of hypertension-blood pressure was normal.  History of TIA (transient ischemic attack)  Invasive ductal carcinoma of right breast (HCC)  History of hyperlipidemia  History of sleep apnea  Orders: Orders Placed This Encounter  Procedures   CBC with Differential/Platelet   Comprehensive metabolic panel with GFR   No orders of the defined types were placed in this encounter.    Follow-Up Instructions: Return in about 5 months (around 06/29/2024) for Rheumatoid arthritis.   Maya Nash, MD  Note - This  record has been created using Animal nutritionist.  Chart creation errors have been sought, but may not always  have been located. Such creation errors do not reflect on  the standard of medical care.     [1]  Social History Tobacco Use   Smoking status: Former    Current packs/day: 0.00    Types: Cigarettes    Quit date: 03/16/1980    Years since quitting: 43.9    Passive exposure: Never   Smokeless tobacco: Never  Vaping Use   Vaping status: Never Used  Substance Use Topics   Alcohol use: No   Drug use: No   "

## 2024-01-27 ENCOUNTER — Other Ambulatory Visit: Payer: Self-pay

## 2024-01-27 NOTE — Progress Notes (Signed)
 Specialty Pharmacy Refill Coordination Note  [name] is a 79 y.o. female contacted today regarding refills of specialty medication(s) Abatacept  (Orencia  ClickJect)   Patient requested Delivery   Delivery date: 02/04/24   Verified address: [address] [place] [address]   Medication will be filled on: 02/03/24  Patient aware of $100 copay and provided updated payment information.

## 2024-01-29 ENCOUNTER — Other Ambulatory Visit: Payer: Self-pay

## 2024-01-29 NOTE — Progress Notes (Signed)
 Patient was contacted by the pharmacy regarding their specialty medication(s) Abatacept  (Orencia  ClickJect) to reschedule an earlier delivery date, due to impending cold weather conditions. Medication(s) will be filled 01/29/24 for a delivery by 01/30/24

## 2024-01-30 ENCOUNTER — Encounter: Payer: Self-pay | Admitting: Rheumatology

## 2024-01-30 ENCOUNTER — Ambulatory Visit: Attending: Rheumatology | Admitting: Rheumatology

## 2024-01-30 VITALS — BP 134/83 | HR 71 | Temp 97.7°F | Resp 14 | Ht 59.0 in | Wt 205.6 lb

## 2024-01-30 DIAGNOSIS — M19072 Primary osteoarthritis, left ankle and foot: Secondary | ICD-10-CM | POA: Diagnosis not present

## 2024-01-30 DIAGNOSIS — M0609 Rheumatoid arthritis without rheumatoid factor, multiple sites: Secondary | ICD-10-CM | POA: Diagnosis not present

## 2024-01-30 DIAGNOSIS — Z96653 Presence of artificial knee joint, bilateral: Secondary | ICD-10-CM | POA: Diagnosis not present

## 2024-01-30 DIAGNOSIS — Z8639 Personal history of other endocrine, nutritional and metabolic disease: Secondary | ICD-10-CM

## 2024-01-30 DIAGNOSIS — Z8673 Personal history of transient ischemic attack (TIA), and cerebral infarction without residual deficits: Secondary | ICD-10-CM | POA: Diagnosis not present

## 2024-01-30 DIAGNOSIS — M19071 Primary osteoarthritis, right ankle and foot: Secondary | ICD-10-CM

## 2024-01-30 DIAGNOSIS — Z79899 Other long term (current) drug therapy: Secondary | ICD-10-CM | POA: Diagnosis not present

## 2024-01-30 DIAGNOSIS — M51369 Other intervertebral disc degeneration, lumbar region without mention of lumbar back pain or lower extremity pain: Secondary | ICD-10-CM

## 2024-01-30 DIAGNOSIS — Z8669 Personal history of other diseases of the nervous system and sense organs: Secondary | ICD-10-CM | POA: Diagnosis not present

## 2024-01-30 DIAGNOSIS — Z8679 Personal history of other diseases of the circulatory system: Secondary | ICD-10-CM | POA: Diagnosis not present

## 2024-01-30 DIAGNOSIS — Z5181 Encounter for therapeutic drug level monitoring: Secondary | ICD-10-CM | POA: Diagnosis not present

## 2024-01-30 DIAGNOSIS — C50911 Malignant neoplasm of unspecified site of right female breast: Secondary | ICD-10-CM

## 2024-01-30 NOTE — Patient Instructions (Signed)
 Standing Labs We placed an order today for your standing lab work.   Please have your standing labs drawn in April and every 3 months  Please have your labs drawn 2 weeks prior to your appointment so that the provider can discuss your lab results at your appointment, if possible.  Please note that you may see your imaging and lab results in MyChart before we have reviewed them. We will contact you once all results are reviewed. Please allow our office up to 72 hours to thoroughly review all of the results before contacting the office for clarification of your results.  WALK-IN LAB HOURS  Monday through Thursday from 8:00 am - 4:30 pm and Friday from 8:00 am-12:00 pm.  Patients with office visits requiring labs will be seen before walk-in labs.  You may encounter longer than normal wait times. Please allow additional time. Wait times may be shorter on  Monday and Thursday afternoons.  We do not book appointments for walk-in labs. We appreciate your patience and understanding with our staff.   Labs are drawn by [facility]. Please bring your co-pay at the time of your lab draw.  You may receive a bill from [facility] for your lab work.  Please note if you are on Hydroxychloroquine and and an order has been placed for a Hydroxychloroquine level,  you will need to have it drawn 4 hours or more after your last dose.  If you wish to have your labs drawn at another location, please call the office 24 hours in advance so we can fax the orders.  The office is located at [address], [address], [place], [place] [address]   If you have any questions regarding directions or hours of operation,  please call [phone].   As a reminder, please drink plenty of water prior to coming for your lab work. Thanks!   Vaccines You are taking a medication(s) that can suppress your immune system.  The following immunizations are recommended: Flu annually Covid-19  Td/Tdap (tetanus, diphtheria, pertussis) every  10 years Pneumonia (Prevnar 15 then Pneumovax 23 at least 1 year apart.  Alternatively, can take Prevnar 20 without needing additional dose) Shingrix: 2 doses from 4 weeks to 6 months apart  Please check with your PCP to make sure you are up to date.   If you have signs or symptoms of an infection or start antibiotics: First, call your PCP for workup of your infection. Hold your medication through the infection, until you complete your antibiotics, and until symptoms resolve if you take the following: Injectable medication (Actemra, Benlysta, Cimzia, Cosentyx, Enbrel, Humira , Kevzara, Orencia , Remicade, Simponi, Stelara, Taltz, Tremfya) Methotrexate Leflunomide (Arava) Mycophenolate (Cellcept) Xeljanz, Olumiant, or Rinvoq   Please see a dermatologist once a year to screen for skin cancer while you are on Orencia .  Please use sunscreen and sun protection.

## 2024-01-31 ENCOUNTER — Ambulatory Visit: Payer: Self-pay | Admitting: Rheumatology

## 2024-01-31 LAB — COMPREHENSIVE METABOLIC PANEL WITH GFR
AG Ratio: 1.3 (calc) (ref 1.0–2.5)
ALT: 11 U/L (ref 6–29)
AST: 18 U/L (ref 10–35)
Albumin: 4.2 g/dL (ref 3.6–5.1)
Alkaline phosphatase (APISO): 106 U/L (ref 37–153)
BUN: 16 mg/dL (ref 7–25)
CO2: 30 mmol/L (ref 20–32)
Calcium: 9.7 mg/dL (ref 8.6–10.4)
Chloride: 104 mmol/L (ref 98–110)
Creat: 0.89 mg/dL (ref 0.60–1.00)
Globulin: 3.2 g/dL (ref 1.9–3.7)
Glucose, Bld: 77 mg/dL (ref 65–99)
Potassium: 4.5 mmol/L (ref 3.5–5.3)
Sodium: 142 mmol/L (ref 135–146)
Total Bilirubin: 0.4 mg/dL (ref 0.2–1.2)
Total Protein: 7.4 g/dL (ref 6.1–8.1)
eGFR: 66 mL/min/1.73m2

## 2024-01-31 LAB — CBC WITH DIFFERENTIAL/PLATELET
Absolute Lymphocytes: 1357 {cells}/uL (ref 850–3900)
Absolute Monocytes: 338 {cells}/uL (ref 200–950)
Basophils Absolute: 62 {cells}/uL (ref 0–200)
Basophils Relative: 1.2 %
Eosinophils Absolute: 130 {cells}/uL (ref 15–500)
Eosinophils Relative: 2.5 %
HCT: 39.3 % (ref 35.9–46.0)
Hemoglobin: 12.3 g/dL (ref 11.7–15.5)
MCH: 28.5 pg (ref 27.0–33.0)
MCHC: 31.3 g/dL — ABNORMAL LOW (ref 31.6–35.4)
MCV: 91.2 fL (ref 81.4–101.7)
MPV: 10.3 fL (ref 7.5–12.5)
Monocytes Relative: 6.5 %
Neutro Abs: 3312 {cells}/uL (ref 1500–7800)
Neutrophils Relative %: 63.7 %
Platelets: 265 Thousand/uL (ref 140–400)
RBC: 4.31 Million/uL (ref 3.80–5.10)
RDW: 13 % (ref 11.0–15.0)
Total Lymphocyte: 26.1 %
WBC: 5.2 Thousand/uL (ref 3.8–10.8)

## 2024-01-31 NOTE — Progress Notes (Signed)
 CBC and CMP are normal.

## 2024-02-06 ENCOUNTER — Telehealth: Payer: Self-pay | Admitting: Pharmacist

## 2024-02-06 NOTE — Telephone Encounter (Addendum)
 Submitted a Prior Authorization renewal request to [employer] for [employer] via CoverMyMeds. Will update once we receive a response.  Key: [id number]    ----- Message from Reena Stark sent at 02/04/2024  4:34 PM EST ----- Per patient, Marcela needs PA. Thank yo.

## 2024-02-11 ENCOUNTER — Other Ambulatory Visit: Payer: Self-pay

## 2024-07-01 ENCOUNTER — Ambulatory Visit: Admitting: Rheumatology
